# Patient Record
Sex: Male | Born: 1955 | Race: White | Hispanic: No | Smoking: Former smoker
Health system: Southern US, Community
[De-identification: ages and names within clinical notes are randomized; demographics above are authoritative.]

## PROBLEM LIST (undated history)

## (undated) DIAGNOSIS — I739 Peripheral vascular disease, unspecified: Secondary | ICD-10-CM

## (undated) DIAGNOSIS — N289 Disorder of kidney and ureter, unspecified: Secondary | ICD-10-CM

## (undated) DIAGNOSIS — R0600 Dyspnea, unspecified: Secondary | ICD-10-CM

## (undated) DIAGNOSIS — I2510 Atherosclerotic heart disease of native coronary artery without angina pectoris: Secondary | ICD-10-CM

## (undated) DIAGNOSIS — E7800 Pure hypercholesterolemia, unspecified: Secondary | ICD-10-CM

## (undated) DIAGNOSIS — I10 Essential (primary) hypertension: Secondary | ICD-10-CM

## (undated) DIAGNOSIS — J449 Chronic obstructive pulmonary disease, unspecified: Secondary | ICD-10-CM

## (undated) HISTORY — PX: CHOLECYSTECTOMY: SHX55

## (undated) HISTORY — PX: CARDIAC SURGERY: SHX584

## (undated) HISTORY — PX: CORONARY ARTERY BYPASS GRAFT: SHX141

## (undated) SURGERY — ESOPHAGOGASTRODUODENOSCOPY (EGD) WITH PROPOFOL
Anesthesia: Monitor Anesthesia Care

---

## 2005-10-02 ENCOUNTER — Inpatient Hospital Stay (HOSPITAL_COMMUNITY): Admission: AD | Admit: 2005-10-02 | Discharge: 2005-10-08 | Payer: Self-pay | Admitting: Orthopedic Surgery

## 2005-10-04 ENCOUNTER — Ambulatory Visit: Payer: Self-pay | Admitting: Hematology and Oncology

## 2005-10-09 ENCOUNTER — Ambulatory Visit: Payer: Self-pay | Admitting: Hematology and Oncology

## 2005-10-24 ENCOUNTER — Ambulatory Visit: Payer: Self-pay | Admitting: Oncology

## 2005-11-01 ENCOUNTER — Other Ambulatory Visit: Admission: RE | Admit: 2005-11-01 | Discharge: 2005-11-01 | Payer: Self-pay | Admitting: Oncology

## 2005-12-12 ENCOUNTER — Ambulatory Visit: Payer: Self-pay | Admitting: Oncology

## 2006-06-11 ENCOUNTER — Encounter: Admission: RE | Admit: 2006-06-11 | Discharge: 2006-06-11 | Payer: Self-pay | Admitting: Neurosurgery

## 2006-07-11 ENCOUNTER — Ambulatory Visit (HOSPITAL_COMMUNITY): Admission: RE | Admit: 2006-07-11 | Discharge: 2006-07-11 | Payer: Self-pay | Admitting: Neurosurgery

## 2006-07-17 ENCOUNTER — Ambulatory Visit: Payer: Self-pay | Admitting: Oncology

## 2006-08-04 ENCOUNTER — Ambulatory Visit (HOSPITAL_COMMUNITY): Admission: RE | Admit: 2006-08-04 | Discharge: 2006-08-05 | Payer: Self-pay | Admitting: Neurosurgery

## 2018-09-17 ENCOUNTER — Encounter (HOSPITAL_COMMUNITY): Payer: Self-pay

## 2018-09-17 ENCOUNTER — Inpatient Hospital Stay (HOSPITAL_COMMUNITY)
Admission: EM | Admit: 2018-09-17 | Discharge: 2018-09-23 | DRG: 628 | Disposition: A | Discharge status: Home / Self Care | Payer: Medicare Other | Attending: Oncology | Admitting: Oncology

## 2018-09-17 ENCOUNTER — Emergency Department (HOSPITAL_COMMUNITY): Payer: Medicare Other

## 2018-09-17 ENCOUNTER — Other Ambulatory Visit: Payer: Self-pay

## 2018-09-17 DIAGNOSIS — R160 Hepatomegaly, not elsewhere classified: Secondary | ICD-10-CM

## 2018-09-17 DIAGNOSIS — M21962 Unspecified acquired deformity of left lower leg: Secondary | ICD-10-CM

## 2018-09-17 DIAGNOSIS — I872 Venous insufficiency (chronic) (peripheral): Secondary | ICD-10-CM | POA: Diagnosis present

## 2018-09-17 DIAGNOSIS — I120 Hypertensive chronic kidney disease with stage 5 chronic kidney disease or end stage renal disease: Secondary | ICD-10-CM | POA: Diagnosis present

## 2018-09-17 DIAGNOSIS — Z8349 Family history of other endocrine, nutritional and metabolic diseases: Secondary | ICD-10-CM

## 2018-09-17 DIAGNOSIS — N2581 Secondary hyperparathyroidism of renal origin: Secondary | ICD-10-CM | POA: Diagnosis present

## 2018-09-17 DIAGNOSIS — Z8719 Personal history of other diseases of the digestive system: Secondary | ICD-10-CM

## 2018-09-17 DIAGNOSIS — R7989 Other specified abnormal findings of blood chemistry: Secondary | ICD-10-CM | POA: Diagnosis present

## 2018-09-17 DIAGNOSIS — E669 Obesity, unspecified: Secondary | ICD-10-CM | POA: Diagnosis present

## 2018-09-17 DIAGNOSIS — N185 Chronic kidney disease, stage 5: Secondary | ICD-10-CM

## 2018-09-17 DIAGNOSIS — E7800 Pure hypercholesterolemia, unspecified: Secondary | ICD-10-CM | POA: Diagnosis present

## 2018-09-17 DIAGNOSIS — J810 Acute pulmonary edema: Secondary | ICD-10-CM

## 2018-09-17 DIAGNOSIS — E785 Hyperlipidemia, unspecified: Secondary | ICD-10-CM | POA: Diagnosis not present

## 2018-09-17 DIAGNOSIS — Z79899 Other long term (current) drug therapy: Secondary | ICD-10-CM

## 2018-09-17 DIAGNOSIS — I4519 Other right bundle-branch block: Secondary | ICD-10-CM | POA: Diagnosis present

## 2018-09-17 DIAGNOSIS — J449 Chronic obstructive pulmonary disease, unspecified: Secondary | ICD-10-CM | POA: Diagnosis present

## 2018-09-17 DIAGNOSIS — I4510 Unspecified right bundle-branch block: Secondary | ICD-10-CM

## 2018-09-17 DIAGNOSIS — Z951 Presence of aortocoronary bypass graft: Secondary | ICD-10-CM

## 2018-09-17 DIAGNOSIS — I2510 Atherosclerotic heart disease of native coronary artery without angina pectoris: Secondary | ICD-10-CM | POA: Diagnosis present

## 2018-09-17 DIAGNOSIS — D693 Immune thrombocytopenic purpura: Secondary | ICD-10-CM | POA: Diagnosis present

## 2018-09-17 DIAGNOSIS — N270 Small kidney, unilateral: Secondary | ICD-10-CM | POA: Diagnosis present

## 2018-09-17 DIAGNOSIS — Z89422 Acquired absence of other left toe(s): Secondary | ICD-10-CM

## 2018-09-17 DIAGNOSIS — Z992 Dependence on renal dialysis: Secondary | ICD-10-CM | POA: Diagnosis not present

## 2018-09-17 DIAGNOSIS — Z9114 Patient's other noncompliance with medication regimen: Secondary | ICD-10-CM

## 2018-09-17 DIAGNOSIS — N186 End stage renal disease: Secondary | ICD-10-CM | POA: Diagnosis present

## 2018-09-17 DIAGNOSIS — N200 Calculus of kidney: Secondary | ICD-10-CM | POA: Diagnosis present

## 2018-09-17 DIAGNOSIS — D696 Thrombocytopenia, unspecified: Secondary | ICD-10-CM | POA: Diagnosis not present

## 2018-09-17 DIAGNOSIS — Z6830 Body mass index (BMI) 30.0-30.9, adult: Secondary | ICD-10-CM | POA: Diagnosis not present

## 2018-09-17 DIAGNOSIS — Z8679 Personal history of other diseases of the circulatory system: Secondary | ICD-10-CM

## 2018-09-17 DIAGNOSIS — E8770 Fluid overload, unspecified: Secondary | ICD-10-CM | POA: Diagnosis not present

## 2018-09-17 DIAGNOSIS — R0600 Dyspnea, unspecified: Secondary | ICD-10-CM | POA: Diagnosis present

## 2018-09-17 DIAGNOSIS — Z8249 Family history of ischemic heart disease and other diseases of the circulatory system: Secondary | ICD-10-CM

## 2018-09-17 DIAGNOSIS — J90 Pleural effusion, not elsewhere classified: Secondary | ICD-10-CM | POA: Diagnosis present

## 2018-09-17 DIAGNOSIS — Z89412 Acquired absence of left great toe: Secondary | ICD-10-CM

## 2018-09-17 DIAGNOSIS — Z9115 Patient's noncompliance with renal dialysis: Secondary | ICD-10-CM | POA: Diagnosis not present

## 2018-09-17 DIAGNOSIS — Z87891 Personal history of nicotine dependence: Secondary | ICD-10-CM

## 2018-09-17 DIAGNOSIS — E8779 Other fluid overload: Secondary | ICD-10-CM | POA: Diagnosis present

## 2018-09-17 DIAGNOSIS — D631 Anemia in chronic kidney disease: Secondary | ICD-10-CM | POA: Diagnosis present

## 2018-09-17 DIAGNOSIS — I4891 Unspecified atrial fibrillation: Secondary | ICD-10-CM | POA: Diagnosis present

## 2018-09-17 DIAGNOSIS — R9431 Abnormal electrocardiogram [ECG] [EKG]: Secondary | ICD-10-CM

## 2018-09-17 DIAGNOSIS — R162 Hepatomegaly with splenomegaly, not elsewhere classified: Secondary | ICD-10-CM | POA: Diagnosis not present

## 2018-09-17 HISTORY — DX: Chronic obstructive pulmonary disease, unspecified: J44.9

## 2018-09-17 HISTORY — DX: Pure hypercholesterolemia, unspecified: E78.00

## 2018-09-17 HISTORY — DX: Essential (primary) hypertension: I10

## 2018-09-17 HISTORY — DX: Disorder of kidney and ureter, unspecified: N28.9

## 2018-09-17 LAB — CBC
HCT: 33.5 % — ABNORMAL LOW (ref 39.0–52.0)
HEMOGLOBIN: 10.3 g/dL — AB (ref 13.0–17.0)
MCH: 28.5 pg (ref 26.0–34.0)
MCHC: 30.7 g/dL (ref 30.0–36.0)
MCV: 92.8 fL (ref 80.0–100.0)
Platelets: 40 10*3/uL — ABNORMAL LOW (ref 150–400)
RBC: 3.61 MIL/uL — ABNORMAL LOW (ref 4.22–5.81)
RDW: 15 % (ref 11.5–15.5)
WBC: 8.4 10*3/uL (ref 4.0–10.5)
nRBC: 0 % (ref 0.0–0.2)

## 2018-09-17 LAB — BASIC METABOLIC PANEL
ANION GAP: 16 — AB (ref 5–15)
BUN: 75 mg/dL — AB (ref 8–23)
CO2: 22 mmol/L (ref 22–32)
Calcium: 9.1 mg/dL (ref 8.9–10.3)
Chloride: 107 mmol/L (ref 98–111)
Creatinine, Ser: 9.74 mg/dL — ABNORMAL HIGH (ref 0.61–1.24)
GFR, EST AFRICAN AMERICAN: 6 mL/min — AB (ref >60)
GFR, EST NON AFRICAN AMERICAN: 5 mL/min — AB (ref >60)
Glucose, Bld: 118 mg/dL — ABNORMAL HIGH (ref 70–99)
POTASSIUM: 3.9 mmol/L (ref 3.5–5.1)
SODIUM: 145 mmol/L (ref 135–145)

## 2018-09-17 LAB — TROPONIN I
TROPONIN I: 0.06 ng/mL — AB (ref <0.03)
Troponin I: 0.08 ng/mL (ref <0.03)

## 2018-09-17 MED ORDER — ACETAMINOPHEN 650 MG RE SUPP: 650.0000 mg | Freq: Four times a day (QID) | RECTAL | Status: DC | PRN

## 2018-09-17 MED ORDER — HEPARIN SODIUM (PORCINE) 1000 UNIT/ML DIALYSIS
20.0000 [IU]/kg | INTRAMUSCULAR | Status: DC | PRN
  Administered 2018-09-17: 1900 [IU] via INTRAVENOUS_CENTRAL

## 2018-09-17 MED ORDER — ALBUTEROL SULFATE (2.5 MG/3ML) 0.083% IN NEBU
INHALATION_SOLUTION | RESPIRATORY_TRACT | Status: AC
  Filled 2018-09-17: qty 3

## 2018-09-17 MED ORDER — CHLORHEXIDINE GLUCONATE CLOTH 2 % EX PADS
6.0000 | MEDICATED_PAD | Freq: Every day | CUTANEOUS | Status: DC
  Administered 2018-09-18 – 2018-09-22 (×5): 6 via TOPICAL

## 2018-09-17 MED ORDER — ALBUTEROL SULFATE (2.5 MG/3ML) 0.083% IN NEBU
3.0000 mL | INHALATION_SOLUTION | Freq: Four times a day (QID) | RESPIRATORY_TRACT | Status: DC | PRN
  Administered 2018-09-17 – 2018-09-18 (×2): 3 mL via RESPIRATORY_TRACT
  Filled 2018-09-17: qty 3

## 2018-09-17 MED ORDER — SODIUM CHLORIDE 0.9 % IV SOLN
1.5000 g | INTRAVENOUS | Status: AC
  Administered 2018-09-18: 1.5 g via INTRAVENOUS
  Filled 2018-09-17 (×2): qty 1.5

## 2018-09-17 MED ORDER — ASPIRIN 81 MG PO CHEW
324.0000 mg | CHEWABLE_TABLET | Freq: Once | ORAL | Status: AC
  Administered 2018-09-17: 324 mg via ORAL
  Filled 2018-09-17: qty 4

## 2018-09-17 MED ORDER — HEPARIN SODIUM (PORCINE) 1000 UNIT/ML IJ SOLN
INTRAMUSCULAR | Status: AC
  Administered 2018-09-17: 1900 [IU] via INTRAVENOUS_CENTRAL
  Filled 2018-09-17: qty 2

## 2018-09-17 MED ORDER — ALBUTEROL SULFATE (2.5 MG/3ML) 0.083% IN NEBU
2.5000 mg | INHALATION_SOLUTION | Freq: Once | RESPIRATORY_TRACT | Status: AC
  Administered 2018-09-17: 2.5 mg via RESPIRATORY_TRACT
  Filled 2018-09-17: qty 3

## 2018-09-17 MED ORDER — SENNOSIDES-DOCUSATE SODIUM 8.6-50 MG PO TABS: 1.0000 | ORAL_TABLET | Freq: Every evening | ORAL | Status: DC | PRN

## 2018-09-17 MED ORDER — HEPARIN SODIUM (PORCINE) 5000 UNIT/ML IJ SOLN
5000.0000 [IU] | Freq: Three times a day (TID) | INTRAMUSCULAR | Status: DC
  Administered 2018-09-17: 5000 [IU] via SUBCUTANEOUS

## 2018-09-17 MED ORDER — ACETAMINOPHEN 325 MG PO TABS
650.0000 mg | ORAL_TABLET | Freq: Four times a day (QID) | ORAL | Status: DC | PRN
  Administered 2018-09-17 – 2018-09-23 (×12): 650 mg via ORAL
  Filled 2018-09-17 (×10): qty 2

## 2018-09-17 NOTE — Consult Note (Addendum)
[facility] NOTE   MRN : [id number]  Reason for Consult: ESRD Referring Physician: Nephrology  History of Present Illness: 63 y/o male who lives in [place], but is planning on moving here has come to [facility] secondary to SOB.  He has a right TDC that was placed in April 2019.  He last had HD Tuesday 09/15/2018.  We have been consulted for permanent access.  Past medical history includes: ESRD, HTN, Hypercholesterolemia, and COPD.       Current Facility-Administered Medications  Medication Dose Route Frequency Provider Last Rate Last Dose  . aspirin chewable tablet 324 mg  324 mg Oral Once Sherwood Gambler, MD      . Derrill Memo ON 09/18/2018] Chlorhexidine Gluconate Cloth 2 % PADS 6 each  6 each Topical Q0600 Rexene Agent, MD       Current Outpatient Medications  Medication Sig Dispense Refill  . acetaminophen (TYLENOL) 500 MG tablet Take 1,000 mg by mouth as needed for mild pain or headache.    . albuterol (PROVENTIL HFA;VENTOLIN HFA) 108 (90 Base) MCG/ACT inhaler Inhale 2 puffs into the lungs every 6 (six) hours as needed for wheezing.    . calcium carbonate (TUMS - DOSED IN MG ELEMENTAL CALCIUM) 500 MG chewable tablet Chew 2 tablets by mouth 3 (three) times daily before meals.    Marland Kitchen ipratropium-albuterol (DUONEB) 0.5-2.5 (3) MG/3ML SOLN Take 3 mLs by nebulization every 6 (six) hours as needed (wheezing).      Pt meds include: Statin :No Betablocker: No ASA: No Other anticoagulants/antiplatelets: None  Past Medical History:  Diagnosis Date  . COPD (chronic obstructive pulmonary disease) ([place])   . High cholesterol   . Hypertension   . Renal disorder     Past Surgical History:  Procedure Laterality Date  . CARDIAC SURGERY      Social History Social History   Tobacco Use  . Smoking status: Not on file  Substance Use Topics  . Alcohol use: Not on file  . Drug use: Not on file    Family History  HTN,  hyperchoolesterolemia  REVIEW OF SYSTEMS  General: [ ]  Weight loss, [ ]  Fever, [ ]  chills Neurologic: [ ]  Dizziness, [ ]  Blackouts, [ ]  Seizure [ ]  Stroke, [ ]  "Mini stroke", [ ]  Slurred speech, [ ]  Temporary blindness; [ ]  weakness in arms or legs, [ ]  Hoarseness [ ]  Dysphagia Cardiac: [ ]  Chest pain/pressure, [ ]  Shortness of breath at rest [x ] Shortness of breath with exertion, [ ]  Atrial fibrillation or irregular heartbeat  Vascular: [ ]  Pain in legs with walking, [ ]  Pain in legs at rest, [ ]  Pain in legs at night,  [ ]  Non-healing ulcer, [ ]  Blood clot in vein/DVT,   Pulmonary: [ ]  Home oxygen, [ ]  Productive cough, [ ]  Coughing up blood, [ ]  Asthma,  [ ]  Wheezing [x ] COPD Musculoskeletal:  [ ]  Arthritis, [ ]  Low back pain, [ ]  Joint pain Hematologic: [ ]  Easy Bruising, [x ] Anemia; [ ]  Hepatitis Gastrointestinal: [ ]  Blood in stool, [ ]  Gastroesophageal Reflux/heartburn, Urinary: [ ]  chronic Kidney disease, [x ] on HD - [ ]  MWF or [x ] TTHS, [ ]  Burning with urination, [ ]  Difficulty urinating Skin: [ ]  Rashes, [ ]  Wounds Psychological: [ ]  Anxiety, [ ]  Depression  Physical Examination Vitals:   09/17/18 1305 09/17/18 1507  BP: (!) 136/97 (!) 158/142  Pulse: 90 (!) 104  Resp: 20 (!) 28  Temp: 97.6 F (36.4 C)   TempSrc: Oral   SpO2: 99% 100%   There is no height or weight on file to calculate BMI.  General:  WDWN in NAD HENT: WNL, normocephalic Eyes: Pupils equal Pulmonary: normal non-labored breathing , without Rales, rhonchi,  wheezing Cardiac: RRR, without  Murmurs, rubs or gallops; No carotid bruits Abdomen: soft, NT, no masses Skin: no rashes, ulcers noted;  no Gangrene , no cellulitis; no open wounds;  Vascular Exam/Pulses:Palpable radial and brachial pulses Musculoskeletal: no muscle wasting or atrophy; B LE edema  Neurologic: A&O X 3; Appropriate Affect ;  SENSATION: normal; MOTOR FUNCTION: 5/5 Symmetric Speech is fluent/normal   Significant Diagnostic  Studies: CBC Lab Results  Component Value Date   WBC 8.4 09/17/2018   HGB 10.3 (L) 09/17/2018   HCT 33.5 (L) 09/17/2018   MCV 92.8 09/17/2018   PLT 40 (L) 09/17/2018    BMET    Component Value Date/Time   NA 145 09/17/2018 1450   K 3.9 09/17/2018 1450   CL 107 09/17/2018 1450   CO2 22 09/17/2018 1450   GLUCOSE 118 (H) 09/17/2018 1450   BUN 75 (H) 09/17/2018 1450   CREATININE 9.74 (H) 09/17/2018 1450   CALCIUM 9.1 09/17/2018 1450   GFRNONAA 5 (L) 09/17/2018 1450   GFRAA 6 (L) 09/17/2018 1450   CrCl cannot be calculated (Unknown ideal weight.).  COAG No results found for: INR, PROTIME   Non-Invasive Vascular Imaging:  Pending vein mapping  ASSESSMENT/PLAN:  ESRD Left TDC currently on HD.  Nephrologist plan on HD tonight as an in patient. Left hand dominant.  We will plan left UE fistula verses graft pending vein mapping.  NPO past MN  Roxy Horseman 09/17/2018 4:52 PM   I have examined the patient, reviewed and agree with above.  Discussed plan for access.  Described AV fistula versus AV graft.  Will image his veins with SonoSite and make determination at the time of procedure tomorrow  Curt Jews, MD 09/17/2018 6:35 PM

## 2018-09-17 NOTE — H&P (Signed)
Date: 09/17/2018               Patient Name:  Larry Klein MRN: [id number]  DOB: 01/26/1956 Age / Sex: 63 y.o., male   PCP: Patient, No Pcp Per         Medical Service: Internal Medicine Teaching Service         Attending Physician: Dr. Annia Belt, MD    First Contact: Dr. Annie Paras Pager: [phone]  Second Contact: Dr. Tarri Abernethy Pager: [phone]       After Hours (After 5p/  First Contact Pager: [phone]  weekends / holidays): Second Contact Pager: [phone]   Chief Complaint: dyspnea  History of Present Illness: Larry Klein is a 63 yo male with a history of ESRD (HD MWF via tunneled dialysis catheter since April 2019), a fib (not anticoagulated), CAD s/p CABG in 2017, AAA s/p repair, HTN, HLD, and COPD who presents to the ED with two days of dyspnea. He moved here from [place], where he was receiving his dialysis, about one week ago. He has not yet set up regular dialysis or seen a nephrologist in the area. He was last dialyzed on Tuesday 09/15/2018 after presenting to a different ED with dyspnea. He feels like they did not get enough fluid off during that session as he quickly began to feel dyspneic and have swelling and distension of the lower extremities, abdomen, and wrists. He endorses headaches, but denies chest pain. Of note, he has not taken any of his medications since April when he started dialysis.   Upon arrival to the ED, patient was afebrile and hypertensive to the 762G-315V systolic. Labs were significant for Hb 10.3, platelets 40, BUN 75, Cr 9.74, and troponin 0.06. EKG shows irregular rate, but P waves are present in some leads and T wave inversions throughout. CXR showed right-sided pleural effusion without obvious pulmonary edema. He received aspirin and albuterol in the ED. Nephrology was consulted in the ED.   Meds:  Current Meds  Medication Sig  . acetaminophen (TYLENOL) 500 MG tablet Take 1,000 mg by mouth as needed for mild pain or headache.  . albuterol  (PROVENTIL HFA;VENTOLIN HFA) 108 (90 Base) MCG/ACT inhaler Inhale 2 puffs into the lungs every 6 (six) hours as needed for wheezing.  . calcium carbonate (TUMS - DOSED IN MG ELEMENTAL CALCIUM) 500 MG chewable tablet Chew 2 tablets by mouth 3 (three) times daily before meals.  Marland Kitchen ipratropium-albuterol (DUONEB) 0.5-2.5 (3) MG/3ML SOLN Take 3 mLs by nebulization every 6 (six) hours as needed (wheezing).    Allergies: Allergies as of 09/17/2018  . (Not on File)   Past Medical History:  Diagnosis Date  . COPD (chronic obstructive pulmonary disease) ([place])   . High cholesterol   . Hypertension   . Renal disorder    Past Surgical History:  Procedure Laterality Date  . CARDIAC SURGERY      Family History: Son has HTN. No family history of kidney disease.  Social History: Recently moved from [place] to [place]. Lives with his son. 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 Former smoker, but quit in 2017. Denies etoh and illicit drug use.  Review of Systems: A complete ROS was negative except as per HPI.   Physical Exam: Blood pressure (!) 150/102, pulse 100, temperature 98 F (36.7 C), temperature source Oral, resp. rate 18, SpO2 99 %. Constitutional: Obese. No distress. Eyes: Pupils are equal, round, and reactive to light. EOM are normal.  Cardiovascular: Normal rate and regular  rhythm. No murmurs, rubs, or gallops. Tunneled catheter on left chest. Pulmonary/Chest: Effort normal. Bilateral basilar crackles. Abdominal: Bowel sounds present. Distended. Soft, non-tender. Ext: Trace lower extremity edema. Skin: Warm and dry. No rashes or wounds.  EKG: personally reviewed my interpretation is EKG shows irregular rate, but P waves are present in some leads and T wave inversions throughout. No prior EKGs available for comparison.  CXR: personally reviewed my interpretation is right-sided pleural effusion without obvious pulmonary edema.  Assessment & Plan by Problem: Active Problems:    Shortness of breath  ESRD on HD MWF - Dialysis schedule has been interrupted by his move to [place]. Last dialyzed on 12/31, however he missed a longer stretch before that.  - Appears volume overloaded on exam with bibasilar crackles, abdominal distension, and trace BLE. - Currently satting well on room air - K 3.9 Plan - Nephrology is following, appreciate recs - Dialysis tonight - Vascular surgery consulted for vein mapping in anticipation of permanent dialysis acess - Tele - am CBC and BMP - NPO at midnight   Elevated troponin - Patient denies chest pain. Troponin was mildly elevated at 0.06, although this is most likely demand ischemia in the setting of volume overload.  - EKG with irregular rate, but P waves are present in some leads and T wave inversions throughout. No prior EKGs available for comparison. Patient has a history of afib, but is not currently anticoagulated. Plan - Trend troponin - Repeat EKG post dialysis  CAD s/p CABG  - Patient is not currently taking any medications for CAD. Consider starting antiplatelet and statin.   HTN - Patient is not currently taking any medications for HTN. Will monitor pressures during admission and consider starting an anti-hypertensive if requires.  COPD - Currently uses an albuterol nebulizer four times a day and Proair inhaler as needed.  - He has never had PFTs or been on maintenance therapy. - Consider starting maintenance inhaler and PFTs as outpatient Plan - albuterol PRN  FEN: no IV fluids, renal diet, NPO at midnight, replace electrolytes as needed  DVT ppx: [place] heparin Code status: FULL code  Dispo: Admit patient to Inpatient with expected length of stay greater than 2 midnights.  Signed: Corinne Ports, MD 09/17/2018, 8:39 PM  Pager: [phone]

## 2018-09-17 NOTE — ED Provider Notes (Signed)
[facility] EMERGENCY DEPARTMENT Provider Note   CSN: [id number] Arrival date & time: 09/17/18  1257     History   Chief Complaint Chief Complaint  Patient presents with  . Needs Dialysis    HPI Larry Klein is a 62 y.o. male.  HPI  63 year old male with a history of COPD, hypertension, chronic thrombocytopenia and end-stage renal disease on dialysis presents requesting dialysis.  He is visiting from [place] as his daughter-in-law is due to give birth soon.  He is not sure how long he will be appear and may make this permanent.  He is currently living in [place].  He last received dialysis 2 days ago and [place] when his daughter-in-law was at another doctor's appointment.  He states over the last couple days he is been feeling short of breath, which he is not sure if it is from fluid or distention of his abdomen or from COPD which he often has as well.  Trace cough.  No chest pain or leg swelling.  No fevers or vomiting. He notes a prior history of afib, no anticoagulation.  He also states that he cannot get into the [place] dialysis center until sent from the [facility] doctors.  Past Medical History:  Diagnosis Date  . COPD (chronic obstructive pulmonary disease) ([place])   . High cholesterol   . Hypertension   . Renal disorder     There are no active problems to display for this patient.   Past Surgical History:  Procedure Laterality Date  . CARDIAC SURGERY          Home Medications    Prior to Admission medications   Medication Sig Start Date End Date Taking? Authorizing Provider  acetaminophen (TYLENOL) 500 MG tablet Take 1,000 mg by mouth as needed for mild pain or headache.   Yes [provider]  albuterol (PROVENTIL HFA;VENTOLIN HFA) 108 (90 Base) MCG/ACT inhaler Inhale 2 puffs into the lungs every 6 (six) hours as needed for wheezing.   Yes [provider]  calcium carbonate (TUMS - DOSED IN MG ELEMENTAL CALCIUM) 500 MG  chewable tablet Chew 2 tablets by mouth 3 (three) times daily before meals.   Yes [provider]  ipratropium-albuterol (DUONEB) 0.5-2.5 (3) MG/3ML SOLN Take 3 mLs by nebulization every 6 (six) hours as needed (wheezing).   Yes [provider]    Family History No family history on file.  Social History Social History   Tobacco Use  . Smoking status: Not on file  Substance Use Topics  . Alcohol use: Not on file  . Drug use: Not on file     Allergies   Patient has no allergy information on record.   Review of Systems Review of Systems  Constitutional: Negative for fever.  Respiratory: Positive for cough and shortness of breath.   Cardiovascular: Negative for chest pain and leg swelling.  Gastrointestinal: Negative for abdominal pain and vomiting.  All other systems reviewed and are negative.    Physical Exam Updated Vital Signs BP (!) 158/142   Pulse (!) 104   Temp 97.6 F (36.4 C) (Oral)   Resp (!) 28   SpO2 100%   Physical Exam Vitals signs and nursing note reviewed.  Constitutional:      Appearance: He is well-developed.  HENT:     Head: Normocephalic and atraumatic.     Right Ear: External ear normal.     Left Ear: External ear normal.     Nose: Nose  normal.  Eyes:     General:        Right eye: No discharge.        Left eye: No discharge.  Neck:     Musculoskeletal: Neck supple.  Cardiovascular:     Rate and Rhythm: Normal rate. Rhythm irregular.     Heart sounds: Normal heart sounds.  Pulmonary:     Effort: Pulmonary effort is normal. Tachypnea present. No accessory muscle usage.     Breath sounds: Normal breath sounds.  Chest:     Comments: Left chest dialysis catheter Abdominal:     Palpations: Abdomen is soft.     Tenderness: There is no abdominal tenderness.  Musculoskeletal:     Right lower leg: No edema.     Left lower leg: No edema.  Skin:    General: Skin is warm and dry.  Neurological:     Mental Status: He is  alert.  Psychiatric:        Mood and Affect: Mood is not anxious.      ED Treatments / Results  Labs (all labs ordered are listed, but only abnormal results are displayed) Labs Reviewed  BASIC METABOLIC PANEL - Abnormal; Notable for the following components:      Result Value   Glucose, Bld 118 (*)    BUN 75 (*)    Creatinine, Ser 9.74 (*)    GFR calc non Af Amer 5 (*)    GFR calc Af Amer 6 (*)    Anion gap 16 (*)    All other components within normal limits  CBC - Abnormal; Notable for the following components:   RBC 3.61 (*)    Hemoglobin 10.3 (*)    HCT 33.5 (*)    Platelets 40 (*)    All other components within normal limits  TROPONIN I - Abnormal; Notable for the following components:   Troponin I 0.06 (*)    All other components within normal limits  PROTIME-INR  BASIC METABOLIC PANEL  CBC    EKG EKG Interpretation  Date/Time:  Thursday September 17 2018 14:44:57 EST Ventricular Rate:  97 PR Interval:    QRS Duration: 116 QT Interval:  372 QTC Calculation: 472 R Axis:   -65 Text Interpretation:  Atrial fibrillation Incomplete right bundle branch block Left anterior fascicular block ST & T wave abnormality, consider inferior ischemia ST & T wave abnormality, consider anterolateral ischemia Prolonged QT Abnormal ECG Confirmed by Nat Christen [phone]) on 09/17/2018 4:14:22 PM   Radiology Dg Chest 2 View  Result Date: 09/17/2018 CLINICAL DATA:  Shortness of breath. EXAM: CHEST - 2 VIEW COMPARISON:  10/02/2005 FINDINGS: The heart is at the upper limits of normal in size. Tortuosity of the thoracic aorta. Interval CABG. Double lumen central catheter in place with the tips in the superior vena cava just above the cavoatrial junction. There is a small right pleural effusion. Slight accentuation of the interstitial markings at the right lung may represent mild pulmonary edema. Left lung is clear. No acute bone abnormality. Previous anterior cervical fusion. IMPRESSION: Small  right pleural effusion. Possible mild pulmonary edema at the right lung base. Electronically Signed   By: Lorriane Shire M.D.   On: 09/17/2018 15:52    Procedures Procedures (including critical care time)  Medications Ordered in ED Medications  Chlorhexidine Gluconate Cloth 2 % PADS 6 each (has no administration in time range)  cefUROXime (ZINACEF) 1.5 g in sodium chloride 0.9 % 100 mL IVPB (has no administration  in time range)  albuterol (PROVENTIL) (2.5 MG/3ML) 0.083% nebulizer solution 2.5 mg (2.5 mg Nebulization Given 09/17/18 1604)  aspirin chewable tablet 324 mg (324 mg Oral Given 09/17/18 1716)     Initial Impression / Assessment and Plan / ED Course  I have reviewed the triage vital signs and the nursing notes.  Pertinent labs & imaging results that were available during my care of the patient were reviewed by me and considered in my medical decision making (see chart for details).     Dr. Joelyn Oms has seen patient and request medical admission.  He will dialyze but the patient will need permanent dialysis access.  The patient likely has a little bit of pulmonary edema causing his shortness of breath.  Has an abnormal ECG but the last comparison is over 10 years ago and has had a CABG since, unclear if new or old EKG changes.  Troponins probably more from pulmonary edema and creatinine rather than ACS but will need to be trended.  Internal medicine teaching service to admit.  Final Clinical Impressions(s) / ED Diagnoses   Final diagnoses:  ESRD (end stage renal disease) on dialysis [facility])  Acute pulmonary edema [facility])    ED Discharge Orders    None       Sherwood Gambler, MD 09/17/18 [phone]

## 2018-09-17 NOTE — ED Triage Notes (Signed)
Pt visiting from [place] and is here to receive dialysis. Pt last treatment was last Tuesday. Pt endorsing Shortness of breath, denies any pain. NAD noted in triage.

## 2018-09-17 NOTE — ED Notes (Signed)
Pt returned from Xray per RT.

## 2018-09-17 NOTE — ED Notes (Signed)
Vascular at bedside

## 2018-09-17 NOTE — ED Notes (Signed)
Patient transported to X-ray 

## 2018-09-17 NOTE — ED Notes (Signed)
Critical result give to Dr. Verta Ellen at bedside.

## 2018-09-17 NOTE — Consult Note (Signed)
[facility] Admit Date: 09/17/2018 09/17/2018 Rexene Agent Requesting Physician:  Regenia Skeeter MD  Reason for Consult:  ESRD, SOB, No HD Unit HPI:  63 year old male ESRD, recently moved to the area from [place], presented to the emergency room with shortness of breath and for dialysis.  PMH Incudes:  CAD with history of four-vessel CABG 2017  AAA status post surgical repair  COPD  Past tobacco user  Patient has received dialysis since April of this year via tunneled dialysis catheter without permanent access.  He has moved into the area as he is expecting a newborn in the family.  He clearly states he intends to live here permanently.  He states that he made attempts to connect to the [facility] center but was unsuccessful; I called them and they are unaware of him.  Labs are notable for potassium of 3.9, BUN 75, bicarbonate 22, hemoglobin 10.3.  2 view chest x-ray with small right pleural effusion, possible mild pulmonary edema.  Patient without fever, chills, cough.  No significant lower extremity edema.  No angina.   Creatinine, Ser (mg/dL)  Date Value  09/17/2018 9.74 (H)  ] I/Os: Balance of 12 systems is negative w/ exceptions as above  PMH  Past Medical History:  Diagnosis Date  . COPD (chronic obstructive pulmonary disease) ([place])   . High cholesterol   . Hypertension   . Renal disorder    PSH  Past Surgical History:  Procedure Laterality Date  . CARDIAC SURGERY     FH No family history on file. SH  has no history on file for tobacco, alcohol, and drug. Allergies Not on File Home medications Prior to Admission medications   Medication Sig Start Date End Date Taking? Authorizing Provider  acetaminophen (TYLENOL) 500 MG tablet Take 1,000 mg by mouth as needed for mild pain or headache.   Yes [provider]  albuterol (PROVENTIL HFA;VENTOLIN HFA) 108 (90 Base) MCG/ACT inhaler Inhale 2 puffs into the lungs every 6 (six) hours as needed  for wheezing.   Yes [provider]  calcium carbonate (TUMS - DOSED IN MG ELEMENTAL CALCIUM) 500 MG chewable tablet Chew 2 tablets by mouth 3 (three) times daily before meals.   Yes [provider]  ipratropium-albuterol (DUONEB) 0.5-2.5 (3) MG/3ML SOLN Take 3 mLs by nebulization every 6 (six) hours as needed (wheezing).   Yes [provider]    Current Medications Scheduled Meds: . aspirin  324 mg Oral Once  . [START ON 09/18/2018] Chlorhexidine Gluconate Cloth  6 each Topical Q0600   Continuous Infusions: PRN Meds:.  CBC Recent Labs  Lab 09/17/18 1450  WBC 8.4  HGB 10.3*  HCT 33.5*  MCV 92.8  PLT 40*   Basic Metabolic Panel Recent Labs  Lab 09/17/18 1450  NA 145  K 3.9  CL 107  CO2 22  GLUCOSE 118*  BUN 75*  CREATININE 9.74*  CALCIUM 9.1    Physical Exam  Blood pressure (!) 158/142, pulse (!) 104, temperature 97.6 F (36.4 C), temperature source Oral, resp. rate (!) 28, SpO2 100 %. GEN: NAD, obese ENT: NCAT EYES: EOMI, anicteric sclera CV: RRR, normal S1 and S2 without murmur or rub PULM: Clear bilaterally, normal work of breathing, speaks in full sentences ABD: Soft, nontender SKIN: No rashes or lesions; left internal jugular tunneled dialysis catheter exit site and tract are clean dry intact without erythema or purulence EXT: No edema   Assessment 63 year old male ESRD relocated the area, currently without dialysis  home, with shortness of breath and mild pulmonary edema  1. ESRD, no outpt HD Unit after moving into the area 2. Lack of permanent access 3. Anemia, mild 4. CKD - BMD 5. CAD hx/o CABG 6. AAA s/p repair 7. COPD, past T user  Plan 1. HD tonight 4h TDC 2K start with 3L tight heparin 2. VVS consulted for AVF; vein mapping ordered 3. CLIP to outpt HD unit 4. Anemia labs 5. Check Phos and PTH    Pearson Grippe MD [phone] pgr 09/17/2018, 4:55 PM

## 2018-09-17 NOTE — ED Notes (Signed)
ED Provider at bedside.  Last dialyzed on Tuesday. He reports he needs dialysis today and he has been shortness of breath for 2 days hx of sob denies chest pain. He reports it happens sporadically.

## 2018-09-18 ENCOUNTER — Encounter (HOSPITAL_COMMUNITY): Payer: Self-pay

## 2018-09-18 ENCOUNTER — Inpatient Hospital Stay (HOSPITAL_COMMUNITY): Payer: Medicare Other | Admitting: Certified Registered Nurse Anesthetist

## 2018-09-18 ENCOUNTER — Encounter (HOSPITAL_COMMUNITY)
Admission: EM | Disposition: A | Discharge status: Home / Self Care | Payer: Self-pay | Source: Home / Self Care | Attending: Oncology

## 2018-09-18 ENCOUNTER — Telehealth: Payer: Self-pay | Admitting: Vascular Surgery

## 2018-09-18 HISTORY — PX: THROMBECTOMY W/ EMBOLECTOMY: SHX2507

## 2018-09-18 LAB — SURGICAL PCR SCREEN
MRSA, PCR: NEGATIVE
Staphylococcus aureus: NEGATIVE

## 2018-09-18 LAB — COMPREHENSIVE METABOLIC PANEL
ALBUMIN: 3.8 g/dL (ref 3.5–5.0)
ALT: 15 U/L (ref 0–44)
AST: 16 U/L (ref 15–41)
Alkaline Phosphatase: 64 U/L (ref 38–126)
Anion gap: 11 (ref 5–15)
BUN: 33 mg/dL — AB (ref 8–23)
CO2: 24 mmol/L (ref 22–32)
Calcium: 8.4 mg/dL — ABNORMAL LOW (ref 8.9–10.3)
Chloride: 103 mmol/L (ref 98–111)
Creatinine, Ser: 5.98 mg/dL — ABNORMAL HIGH (ref 0.61–1.24)
GFR calc Af Amer: 11 mL/min — ABNORMAL LOW (ref >60)
GFR calc non Af Amer: 9 mL/min — ABNORMAL LOW (ref >60)
Glucose, Bld: 166 mg/dL — ABNORMAL HIGH (ref 70–99)
Potassium: 4.2 mmol/L (ref 3.5–5.1)
Sodium: 138 mmol/L (ref 135–145)
Total Bilirubin: 0.6 mg/dL (ref 0.3–1.2)
Total Protein: 7 g/dL (ref 6.5–8.1)

## 2018-09-18 LAB — TROPONIN I: Troponin I: 0.08 ng/mL (ref <0.03)

## 2018-09-18 LAB — CBC
HCT: 33.2 % — ABNORMAL LOW (ref 39.0–52.0)
Hemoglobin: 10.6 g/dL — ABNORMAL LOW (ref 13.0–17.0)
MCH: 28.8 pg (ref 26.0–34.0)
MCHC: 31.9 g/dL (ref 30.0–36.0)
MCV: 90.2 fL (ref 80.0–100.0)
PLATELETS: 30 10*3/uL — AB (ref 150–400)
RBC: 3.68 MIL/uL — ABNORMAL LOW (ref 4.22–5.81)
RDW: 14.9 % (ref 11.5–15.5)
WBC: 9.6 10*3/uL (ref 4.0–10.5)
nRBC: 0.2 % (ref 0.0–0.2)

## 2018-09-18 LAB — CBC WITH DIFFERENTIAL/PLATELET
Abs Immature Granulocytes: 0.05 10*3/uL (ref 0.00–0.07)
Basophils Absolute: 0 10*3/uL (ref 0.0–0.1)
Basophils Relative: 0 %
Eosinophils Absolute: 0.1 10*3/uL (ref 0.0–0.5)
Eosinophils Relative: 1 %
HCT: 32.7 % — ABNORMAL LOW (ref 39.0–52.0)
Hemoglobin: 10.3 g/dL — ABNORMAL LOW (ref 13.0–17.0)
Immature Granulocytes: 1 %
LYMPHS ABS: 0.3 10*3/uL — AB (ref 0.7–4.0)
Lymphocytes Relative: 5 %
MCH: 28.7 pg (ref 26.0–34.0)
MCHC: 31.5 g/dL (ref 30.0–36.0)
MCV: 91.1 fL (ref 80.0–100.0)
Monocytes Absolute: 0.2 10*3/uL (ref 0.1–1.0)
Monocytes Relative: 2 %
Neutro Abs: 6.9 10*3/uL (ref 1.7–7.7)
Neutrophils Relative %: 91 %
Platelets: 30 10*3/uL — ABNORMAL LOW (ref 150–400)
RBC: 3.59 MIL/uL — ABNORMAL LOW (ref 4.22–5.81)
RDW: 14.7 % (ref 11.5–15.5)
WBC: 7.5 10*3/uL (ref 4.0–10.5)
nRBC: 0 % (ref 0.0–0.2)

## 2018-09-18 LAB — PROTIME-INR
INR: 1.18
Prothrombin Time: 14.9 seconds (ref 11.4–15.2)

## 2018-09-18 LAB — HIV ANTIBODY (ROUTINE TESTING W REFLEX): HIV Screen 4th Generation wRfx: NONREACTIVE

## 2018-09-18 LAB — RETICULOCYTES
IMMATURE RETIC FRACT: 21.3 % — AB (ref 2.3–15.9)
RBC.: 3.59 MIL/uL — AB (ref 4.22–5.81)
Retic Count, Absolute: 68.6 10*3/uL (ref 19.0–186.0)
Retic Ct Pct: 1.9 % (ref 0.4–3.1)

## 2018-09-18 LAB — IRON AND TIBC
Iron: 31 ug/dL — ABNORMAL LOW (ref 45–182)
Saturation Ratios: 14 % — ABNORMAL LOW (ref 17.9–39.5)
TIBC: 230 ug/dL — ABNORMAL LOW (ref 250–450)
UIBC: 199 ug/dL

## 2018-09-18 LAB — LACTATE DEHYDROGENASE: LDH: 150 U/L (ref 98–192)

## 2018-09-18 LAB — DIRECT ANTIGLOBULIN TEST (NOT AT ARMC)
DAT, IgG: NEGATIVE
DAT, complement: NEGATIVE

## 2018-09-18 LAB — PHOSPHORUS: PHOSPHORUS: 4.2 mg/dL (ref 2.5–4.6)

## 2018-09-18 LAB — SAVE SMEAR(SSMR), FOR PROVIDER SLIDE REVIEW

## 2018-09-18 LAB — FERRITIN: Ferritin: 489 ng/mL — ABNORMAL HIGH (ref 24–336)

## 2018-09-18 SURGERY — THROMBECTOMY ARTERIOVENOUS FISTULA
Anesthesia: General | Laterality: Left

## 2018-09-18 MED ORDER — ONDANSETRON HCL 4 MG/2ML IJ SOLN: 4.0000 mg | Freq: Once | INTRAMUSCULAR | Status: DC | PRN

## 2018-09-18 MED ORDER — FENTANYL CITRATE (PF) 100 MCG/2ML IJ SOLN: 25.0000 ug | INTRAMUSCULAR | Status: DC | PRN

## 2018-09-18 MED ORDER — SODIUM CHLORIDE 0.9 % IV SOLN
INTRAVENOUS | Status: AC
  Filled 2018-09-18: qty 1.2

## 2018-09-18 MED ORDER — ALBUMIN HUMAN 5 % IV SOLN
INTRAVENOUS | Status: DC | PRN
  Administered 2018-09-18: 10:00:00 via INTRAVENOUS

## 2018-09-18 MED ORDER — DEXAMETHASONE SODIUM PHOSPHATE 10 MG/ML IJ SOLN
INTRAMUSCULAR | Status: AC
  Filled 2018-09-18: qty 1

## 2018-09-18 MED ORDER — FENTANYL CITRATE (PF) 100 MCG/2ML IJ SOLN
INTRAMUSCULAR | Status: DC | PRN
  Administered 2018-09-18 (×2): 25 ug via INTRAVENOUS

## 2018-09-18 MED ORDER — SODIUM CHLORIDE 0.9 % IV SOLN
INTRAVENOUS | Status: DC | PRN
  Administered 2018-09-18: 40 ug/min via INTRAVENOUS

## 2018-09-18 MED ORDER — HEPARIN SODIUM (PORCINE) 1000 UNIT/ML DIALYSIS
1000.0000 [IU] | INTRAMUSCULAR | Status: DC | PRN
  Administered 2018-09-18: 1000 [IU] via INTRAVENOUS_CENTRAL
  Administered 2018-09-19: 3900 [IU] via INTRAVENOUS_CENTRAL

## 2018-09-18 MED ORDER — ONDANSETRON HCL 4 MG/2ML IJ SOLN
INTRAMUSCULAR | Status: DC | PRN
  Administered 2018-09-18: 4 mg via INTRAVENOUS

## 2018-09-18 MED ORDER — PROPOFOL 10 MG/ML IV BOLUS
INTRAVENOUS | Status: DC | PRN
  Administered 2018-09-18: 130 mg via INTRAVENOUS

## 2018-09-18 MED ORDER — ONDANSETRON HCL 4 MG/2ML IJ SOLN
INTRAMUSCULAR | Status: AC
  Filled 2018-09-18: qty 2

## 2018-09-18 MED ORDER — SODIUM CHLORIDE 0.9 % IV SOLN
INTRAVENOUS | Status: DC | PRN
  Administered 2018-09-18: 09:00:00

## 2018-09-18 MED ORDER — PHENYLEPHRINE 40 MCG/ML (10ML) SYRINGE FOR IV PUSH (FOR BLOOD PRESSURE SUPPORT)
PREFILLED_SYRINGE | INTRAVENOUS | Status: DC | PRN
  Administered 2018-09-18: 80 ug via INTRAVENOUS

## 2018-09-18 MED ORDER — MIDAZOLAM HCL 2 MG/2ML IJ SOLN
INTRAMUSCULAR | Status: AC
  Filled 2018-09-18: qty 2

## 2018-09-18 MED ORDER — SODIUM CHLORIDE 0.9 % IV SOLN
INTRAVENOUS | Status: DC
  Administered 2018-09-18: 08:00:00 via INTRAVENOUS

## 2018-09-18 MED ORDER — SODIUM CHLORIDE 0.9 % IV SOLN
INTRAVENOUS | Status: DC | PRN
  Administered 2018-09-18: 09:00:00 via INTRAVENOUS

## 2018-09-18 MED ORDER — SODIUM CHLORIDE 0.9 % IR SOLN
Status: DC | PRN
  Administered 2018-09-18: 1000 mL

## 2018-09-18 MED ORDER — LIDOCAINE 2% (20 MG/ML) 5 ML SYRINGE
INTRAMUSCULAR | Status: AC
  Filled 2018-09-18: qty 5

## 2018-09-18 MED ORDER — LIDOCAINE-EPINEPHRINE 0.5 %-1:200000 IJ SOLN
INTRAMUSCULAR | Status: AC
  Filled 2018-09-18: qty 1

## 2018-09-18 MED ORDER — PROPOFOL 10 MG/ML IV BOLUS
INTRAVENOUS | Status: AC
  Filled 2018-09-18: qty 20

## 2018-09-18 MED ORDER — FENTANYL CITRATE (PF) 250 MCG/5ML IJ SOLN
INTRAMUSCULAR | Status: AC
  Filled 2018-09-18: qty 5

## 2018-09-18 MED ORDER — DEXAMETHASONE SODIUM PHOSPHATE 10 MG/ML IJ SOLN
INTRAMUSCULAR | Status: DC | PRN
  Administered 2018-09-18: 4 mg via INTRAVENOUS

## 2018-09-18 MED ORDER — HEPARIN SODIUM (PORCINE) 1000 UNIT/ML IJ SOLN
INTRAMUSCULAR | Status: AC
  Administered 2018-09-18: 1000 [IU] via INTRAVENOUS_CENTRAL
  Filled 2018-09-18: qty 4

## 2018-09-18 MED ORDER — OXYCODONE-ACETAMINOPHEN 5-325 MG PO TABS: 1.0000 | ORAL_TABLET | Freq: Four times a day (QID) | ORAL | Status: DC | PRN

## 2018-09-18 MED ORDER — LIDOCAINE 2% (20 MG/ML) 5 ML SYRINGE
INTRAMUSCULAR | Status: DC | PRN
  Administered 2018-09-18: 100 mg via INTRAVENOUS

## 2018-09-18 MED ORDER — MIDAZOLAM HCL 5 MG/5ML IJ SOLN
INTRAMUSCULAR | Status: DC | PRN
  Administered 2018-09-18 (×2): 1 mg via INTRAVENOUS

## 2018-09-18 MED ORDER — HEPARIN SODIUM (PORCINE) 5000 UNIT/ML IJ SOLN
5000.0000 [IU] | Freq: Three times a day (TID) | INTRAMUSCULAR | Status: DC
  Administered 2018-09-18 – 2018-09-21 (×5): 5000 [IU] via SUBCUTANEOUS
  Filled 2018-09-18 (×5): qty 1

## 2018-09-18 SURGICAL SUPPLY — 34 items
ADH SKN CLS APL DERMABOND .7 (GAUZE/BANDAGES/DRESSINGS) ×1
ARMBAND PINK RESTRICT EXTREMIT (MISCELLANEOUS) ×4 IMPLANT
CANISTER SUCT 3000ML PPV (MISCELLANEOUS) ×3 IMPLANT
CANNULA VESSEL 3MM 2 BLNT TIP (CANNULA) ×3 IMPLANT
CATH EMB 4FR 80CM (CATHETERS) ×1 IMPLANT
CLIP LIGATING EXTRA MED SLVR (CLIP) ×3 IMPLANT
CLIP LIGATING EXTRA SM BLUE (MISCELLANEOUS) ×3 IMPLANT
COVER WAND RF STERILE (DRAPES) ×1 IMPLANT
DECANTER SPIKE VIAL GLASS SM (MISCELLANEOUS) ×1 IMPLANT
DERMABOND ADVANCED (GAUZE/BANDAGES/DRESSINGS) ×2
DERMABOND ADVANCED .7 DNX12 (GAUZE/BANDAGES/DRESSINGS) ×1 IMPLANT
ELECT REM PT RETURN 9FT ADLT (ELECTROSURGICAL) ×3
ELECTRODE REM PT RTRN 9FT ADLT (ELECTROSURGICAL) ×1 IMPLANT
GLOVE BIO SURGEON STRL SZ 6.5 (GLOVE) ×3 IMPLANT
GLOVE BIO SURGEONS STRL SZ 6.5 (GLOVE) ×3
GLOVE BIOGEL PI IND STRL 6.5 (GLOVE) IMPLANT
GLOVE BIOGEL PI IND STRL 7.0 (GLOVE) IMPLANT
GLOVE BIOGEL PI INDICATOR 6.5 (GLOVE) ×2
GLOVE BIOGEL PI INDICATOR 7.0 (GLOVE) ×2
GLOVE SS BIOGEL STRL SZ 7.5 (GLOVE) ×1 IMPLANT
GLOVE SUPERSENSE BIOGEL SZ 7.5 (GLOVE) ×2
GOWN STRL REUS W/ TWL LRG LVL3 (GOWN DISPOSABLE) ×3 IMPLANT
GOWN STRL REUS W/TWL LRG LVL3 (GOWN DISPOSABLE) ×9
KIT BASIN OR (CUSTOM PROCEDURE TRAY) ×3 IMPLANT
KIT TURNOVER KIT B (KITS) ×3 IMPLANT
NS IRRIG 1000ML POUR BTL (IV SOLUTION) ×3 IMPLANT
PACK CV ACCESS (CUSTOM PROCEDURE TRAY) ×3 IMPLANT
PAD ARMBOARD 7.5X6 YLW CONV (MISCELLANEOUS) ×6 IMPLANT
SUT PROLENE 6 0 CC (SUTURE) ×3 IMPLANT
SUT VIC AB 3-0 SH 27 (SUTURE) ×3
SUT VIC AB 3-0 SH 27X BRD (SUTURE) ×1 IMPLANT
TOWEL GREEN STERILE (TOWEL DISPOSABLE) ×3 IMPLANT
UNDERPAD 30X30 (UNDERPADS AND DIAPERS) ×3 IMPLANT
WATER STERILE IRR 1000ML POUR (IV SOLUTION) ×3 IMPLANT

## 2018-09-18 NOTE — Progress Notes (Signed)
Refuses bed alarm, educated and patient agree to call for assistance ambulating. Will continue to monitor

## 2018-09-18 NOTE — Telephone Encounter (Signed)
sch appt lvm [facility] ltr 10/28/2018 1pm Dialysis duplex 2pm p/o PA

## 2018-09-18 NOTE — Progress Notes (Signed)
Pt ready to depart from PACU. Awaiting transport help

## 2018-09-18 NOTE — Progress Notes (Signed)
HD tx completed @ 0250 w/cramping issues requiring UF off for approximately last 40 mins of tx UF goal not met Blood rinsed back VSS Report called to Rockie Neighbours, RN

## 2018-09-18 NOTE — Progress Notes (Signed)
HD tx initiated via HD cath w/o problem Bilat ports: pull/push/flush equally w/o problem VSS Will continue to monitor while on HD tx 

## 2018-09-18 NOTE — Transfer of Care (Signed)
Immediate Anesthesia Transfer of Care Note  Patient: Larry Klein  Procedure(s) Performed: ARTERIOVENOUS FISTULA LEFT ARM (Left )  Patient Location: PACU  Anesthesia Type:General  Level of Consciousness: awake, alert  and oriented  Airway & Oxygen Therapy: Patient Spontanous Breathing  Post-op Assessment: Report given to RN and Post -op Vital signs reviewed and stable  Post vital signs: Reviewed and stable  Last Vitals:  Vitals Value Taken Time  BP 111/77 09/18/2018 10:53 AM  Temp 36.5 C 09/18/2018 10:53 AM  Pulse 25 09/18/2018 10:54 AM  Resp 18 09/18/2018 10:54 AM  SpO2 99 % 09/18/2018 10:54 AM  Vitals shown include unvalidated device data.  Last Pain:  Vitals:   09/18/18 1053  TempSrc:   PainSc: (P) 0-No pain      Patients Stated Pain Goal: 0 (56/21/30 8657)  Complications: No apparent anesthesia complications

## 2018-09-18 NOTE — Anesthesia Preprocedure Evaluation (Addendum)
Anesthesia Evaluation  Patient identified by MRN, date of birth, ID band Patient awake    Reviewed: Allergy & Precautions, NPO status , Patient's Chart, lab work & pertinent test results  Airway Mallampati: II  TM Distance: >3 FB Neck ROM: Full    Dental  (+) Dental Advisory Given, Edentulous Lower, Upper Dentures   Pulmonary shortness of breath, COPD,  COPD inhaler, former smoker,    Pulmonary exam normal breath sounds clear to auscultation       Cardiovascular hypertension, (-) angina+ CAD, + CABG (2017) and + Peripheral Vascular Disease (AAA s/p repair)  (-) Past MI + dysrhythmias Atrial Fibrillation  Rhythm:Irregular Rate:Abnormal     Neuro/Psych negative neurological ROS  negative psych ROS   GI/Hepatic negative GI ROS, Neg liver ROS,   Endo/Other  negative endocrine ROS  Renal/GU ESRF and DialysisRenal disease (K+ 3.2)     Musculoskeletal negative musculoskeletal ROS (+)   Abdominal   Peds  Hematology  (+) Blood dyscrasia (Thrombocytopenia), anemia ,   Anesthesia Other Findings Day of surgery medications reviewed with the patient.  Reproductive/Obstetrics                            Anesthesia Physical Anesthesia Plan  ASA: III  Anesthesia Plan: General   Post-op Pain Management:    Induction: Intravenous  PONV Risk Score and Plan: 2 and Ondansetron and Midazolam  Airway Management Planned: LMA  Additional Equipment:   Intra-op Plan:   Post-operative Plan: Extubation in OR  Informed Consent: I have reviewed the patients History and Physical, chart, labs and discussed the procedure including the risks, benefits and alternatives for the proposed anesthesia with the patient or authorized representative who has indicated his/her understanding and acceptance.   Dental advisory given  Plan Discussed with: CRNA  Anesthesia Plan Comments:         Anesthesia Quick  Evaluation

## 2018-09-18 NOTE — Progress Notes (Signed)
   Subjective: Larry Klein received HD overnight, however his fluid goal was not met due to cramping during the session. This morning, he underwent creation of left radiocephalic AV fistula with vascular surgery. He is seen sitting comfortably in bed postoperatively. He reports that his dyspnea feels improved and he thinks they got plenty of fluid off during dialysis yesterday. His left wrist is sore from the procedure, but he otherwise feels well. He hopes to continue outpatient dialysis in [place]. He also plans to establish with a PCP in [place].  Objective:  Vital signs in last 24 hours: Vitals:   09/18/18 0250 09/18/18 0300 09/18/18 0359 09/18/18 0425  BP: (!) 168/109 (!) 157/95  (!) 143/111  Pulse: 86 95  96  Resp: '18 20  18  '$ Temp:  98.1 F (36.7 C)  97.6 F (36.4 C)  TempSrc:  Oral  Oral  SpO2: 95% 93% 99% 98%  Weight:  97 kg     Physical Exam Constitutional: Obese. No distress. Eyes:Pupils are equal, round, and reactive to light.EOMare normal.  Cardiovascular:Normal rateand regular rhythm. No murmurs, rubs, or gallops. Tunneled catheter on left chest. Pulmonary/Chest:Effort normal on room air. Faint wheezes bilaterally. No crackles. Abdominal: Bowel sounds present. Distended. Soft, non-tender. Ext: Left wrist incision is clean, dry, and intact s/p AVF creation. 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 Chronic stasis skin changes. Trace lower extremity edema. Skin: Warm and dry.   Assessment/Plan:  Active Problems:   Shortness of breath  Larry Klein is a 63 yo male with a history of ESRD (HD MWF via tunneled dialysis catheter since April 2019), a fib (not anticoagulated), CAD s/p CABG in 2017, AAA s/p repair, HTN, HLD, and COPD who was admitted on 1/2 for dialysis after missed sessions.  ESRD on HD MWF - Dialyzed overnight. Left radiocephalic AV fistula placed this morning. He  will continue to use the tunneled catheter while the fistula matures. - He will require inpatient dialysis until he is connected to an outpatient dialysis center.  Plan - Nephrology is following, appreciate recs - Hemodialysis tomorrow   Elevated troponin - Troponin peaked at 0.08. Likely demand ischemia in the setting of volume overload 2/2 inadequate dialysis prior to admission  CAD s/p CABG  - Patient is not currently taking any medications for CAD. Will defer medical management to future PCP in the outpatient setting.   HTN - Patient is not currently taking any medications for HTN. He reports that he stopped taking them when he started dialysis because his blood pressures were dropping too low. Will continue to monitor pressures while inpatient.  COPD - Wheezes on exam today Plan - Albuterol PRN - Consider PFTs and maintenance therapy in the outpatient setting  Dispo: Anticipated discharge in approximately 1-2 days pending spot at outpatient dialysis center.   Dorrell, Andree Elk, MD 09/18/2018, 6:33 AM Pager: [phone]

## 2018-09-18 NOTE — Progress Notes (Signed)
Patient was seen on dialysis and the procedure was supervised.   BP is 176/104 asymp.  UF 3.2 with net goal of 2.7L [place] 4/2.25  Through [facility]  Patient appears to be tolerating treatment well; no changes.  Otelia Santee, MD 09/18/2018, 1:35 AM

## 2018-09-18 NOTE — Telephone Encounter (Signed)
-----   Message from Gabriel Earing, [place] sent at 09/18/2018 10:37 AM EST ----- S/p left RC AVF 09/18/18.  F/u with pa clinic in 6 weeks with duplex.  Thanks

## 2018-09-18 NOTE — Discharge Instructions (Signed)
° °  [facility]  Discharge Instructions  AV Fistula or Graft Surgery for Dialysis Access  Please refer to the following instructions for your post-procedure care. Your surgeon or physician assistant will discuss any changes with you.  Activity  You may drive the day following your surgery, if you are comfortable and no longer taking prescription pain medication. Resume full activity as the soreness in your incision resolves.  Bathing/Showering  You may shower after you go home. Keep your incision dry for 48 hours. Do not soak in a bathtub, hot tub, or swim until the incision heals completely. You may not shower if you have a hemodialysis catheter.  Incision Care  Clean your incision with mild soap and water after 48 hours. Pat the area dry with a clean towel. You do not need a bandage unless otherwise instructed. Do not apply any ointments or creams to your incision. You may have skin glue on your incision. Do not peel it off. It will come off on its own in about one week. Your arm may swell a bit after surgery. To reduce swelling use pillows to elevate your arm so it is above your heart. Your doctor will tell you if you need to lightly wrap your arm with an ACE bandage.  Diet  Resume your normal diet. There are not special food restrictions following this procedure. In order to heal from your surgery, it is CRITICAL to get adequate nutrition. Your body requires vitamins, minerals, and protein. Vegetables are the best source of vitamins and minerals. Vegetables also provide the perfect balance of protein. Processed food has little nutritional value, so try to avoid this.  Medications  Resume taking all of your medications. If your incision is causing pain, you may take over-the counter pain relievers such as acetaminophen (Tylenol). If you were prescribed a stronger pain medication, please be aware these medications can cause nausea and constipation. Prevent  nausea by taking the medication with a snack or meal. Avoid constipation by drinking plenty of fluids and eating foods with high amount of fiber, such as fruits, vegetables, and grains.  Do not take Tylenol if you are taking prescription pain medications.  Follow up Your surgeon may want to see you in the office following your access surgery. If so, this will be arranged at the time of your surgery.  Please call us immediately for any of the following conditions:  Increased pain, redness, drainage (pus) from your incision site Fever of 101 degrees or higher Severe or worsening pain at your incision site Hand pain or numbness.  Reduce your risk of vascular disease:  Stop smoking. If you would like help, call [facility] at 1-800-QUIT-NOW (303) 510-9436) or [place] at [place] your cholesterol Maintain a desired weight Control your diabetes Keep your blood pressure down  Dialysis  It will take several weeks to several months for your new dialysis access to be ready for use. Your surgeon will determine when it is okay to use it. Your nephrologist will continue to direct your dialysis. You can continue to use your Permcath until your new access is ready for use.   09/18/2018 Larry Klein [id number] [birth year]  Surgeon(s): Early, Arvilla Meres, MD  Procedure(s): Creation of left radial cephalic AV fistula  x Do not stick fistula for 12 weeks    If you have any questions, please call the office at [phone].

## 2018-09-18 NOTE — Op Note (Signed)
    OPERATIVE REPORT  DATE OF SURGERY: 09/18/2018  PATIENT: Larry Klein, 63 y.o. male MRN: [id number]  DOB: 01-27-1956  PRE-OPERATIVE DIAGNOSIS: End-stage renal disease  POST-OPERATIVE DIAGNOSIS:  Same  PROCEDURE: Left radiocephalic AV fistula creation  SURGEON:  Curt Jews, M.D.  PHYSICIAN ASSISTANT: Liana Crocker, PA-C  ANESTHESIA: LMA  EBL: per anesthesia record  Total I/O In: 450 [I.V.:200; IV Piggyback:250] Out: 10 [Blood:10]  BLOOD ADMINISTERED: none  DRAINS: none  SPECIMEN: none  COUNTS CORRECT:  YES  PATIENT DISPOSITION:  PACU - hemodynamically stable  PROCEDURE DETAILS: Patient was taken the operating placed supine position where the area of the left arm was prepped draped you sterile fashion.  SonoSite ultrasound was used to visualize cephalic vein in the arm.  The cephalic vein at the wrist was of moderate size and this was imaged up through the antecubital space and was patent throughout.  An incision was made between the level of the cephalic vein and the radial artery.  The tributary branches were ligated on the cephalic vein and the vein was ligated distally.  The vein was gently dilated with heparinized saline and was of good caliber.  The radial artery was exposed with the same incision.  It had minimal atherosclerotic change and excellent pulse.  The artery was occluded proximally and distally with Serafin clamps.  The artery was opened with an 11 blade sent longitudinally with Potts scissors.  The vein was cut to the appropriate length and was spatulated and sewn end-to-side to the artery with a running 6-0 Prolene suture.  Clamps were removed and excellent thrill was noted.  The wounds irrigated with saline.  Hemostasis was obtained with electrocautery.  The wounds were closed with 3-0 Vicryl in the subcutaneous and subcuticular tissue.  Sterile dressing was applied the patient was transferred to the recovery room in stable condition   Rosetta Posner, M.D.,  [facility] 09/18/2018 11:01 AM

## 2018-09-18 NOTE — Anesthesia Procedure Notes (Signed)
Procedure Name: LMA Insertion °Performed by: Keri Veale H, CRNA °Pre-anesthesia Checklist: Patient identified, Emergency Drugs available, Suction available and Patient being monitored °Patient Re-evaluated:Patient Re-evaluated prior to induction °Oxygen Delivery Method: Circle System Utilized °Preoxygenation: Pre-oxygenation with 100% oxygen °Induction Type: IV induction °Ventilation: Mask ventilation without difficulty °LMA: LMA inserted °LMA Size: 4.0 °Number of attempts: 1 °Airway Equipment and Method: Bite block °Placement Confirmation: positive ETCO2 °Tube secured with: Tape °Dental Injury: Teeth and Oropharynx as per pre-operative assessment  ° ° ° ° ° ° °

## 2018-09-19 ENCOUNTER — Inpatient Hospital Stay (HOSPITAL_COMMUNITY): Payer: Medicare Other

## 2018-09-19 DIAGNOSIS — Z992 Dependence on renal dialysis: Secondary | ICD-10-CM

## 2018-09-19 DIAGNOSIS — R162 Hepatomegaly with splenomegaly, not elsewhere classified: Secondary | ICD-10-CM

## 2018-09-19 DIAGNOSIS — R160 Hepatomegaly, not elsewhere classified: Secondary | ICD-10-CM

## 2018-09-19 DIAGNOSIS — N186 End stage renal disease: Secondary | ICD-10-CM

## 2018-09-19 LAB — RENAL FUNCTION PANEL
Albumin: 3.4 g/dL — ABNORMAL LOW (ref 3.5–5.0)
Anion gap: 13 (ref 5–15)
BUN: 47 mg/dL — ABNORMAL HIGH (ref 8–23)
CO2: 22 mmol/L (ref 22–32)
Calcium: 8.3 mg/dL — ABNORMAL LOW (ref 8.9–10.3)
Chloride: 104 mmol/L (ref 98–111)
Creatinine, Ser: 6.6 mg/dL — ABNORMAL HIGH (ref 0.61–1.24)
GFR calc Af Amer: 9 mL/min — ABNORMAL LOW (ref >60)
GFR calc non Af Amer: 8 mL/min — ABNORMAL LOW (ref >60)
Glucose, Bld: 216 mg/dL — ABNORMAL HIGH (ref 70–99)
Phosphorus: 4.8 mg/dL — ABNORMAL HIGH (ref 2.5–4.6)
Potassium: 3.3 mmol/L — ABNORMAL LOW (ref 3.5–5.1)
Sodium: 139 mmol/L (ref 135–145)

## 2018-09-19 LAB — CBC
HEMATOCRIT: 30.2 % — AB (ref 39.0–52.0)
HEMOGLOBIN: 9.7 g/dL — AB (ref 13.0–17.0)
MCH: 29.8 pg (ref 26.0–34.0)
MCHC: 32.1 g/dL (ref 30.0–36.0)
MCV: 92.6 fL (ref 80.0–100.0)
NRBC: 0 % (ref 0.0–0.2)
Platelets: 47 10*3/uL — ABNORMAL LOW (ref 150–400)
RBC: 3.26 MIL/uL — ABNORMAL LOW (ref 4.22–5.81)
RDW: 14.8 % (ref 11.5–15.5)
WBC: 8.4 10*3/uL (ref 4.0–10.5)

## 2018-09-19 LAB — BASIC METABOLIC PANEL
Anion gap: 15 (ref 5–15)
BUN: 28 mg/dL — ABNORMAL HIGH (ref 8–23)
CO2: 25 mmol/L (ref 22–32)
CREATININE: 4.9 mg/dL — AB (ref 0.61–1.24)
Calcium: 8.5 mg/dL — ABNORMAL LOW (ref 8.9–10.3)
Chloride: 101 mmol/L (ref 98–111)
GFR calc Af Amer: 13 mL/min — ABNORMAL LOW (ref >60)
GFR, EST NON AFRICAN AMERICAN: 12 mL/min — AB (ref >60)
Glucose, Bld: 98 mg/dL (ref 70–99)
Potassium: 3.2 mmol/L — ABNORMAL LOW (ref 3.5–5.1)
Sodium: 141 mmol/L (ref 135–145)

## 2018-09-19 LAB — PARATHYROID HORMONE, INTACT (NO CA): PTH: 256 pg/mL — ABNORMAL HIGH (ref 15–65)

## 2018-09-19 MED ORDER — SODIUM CHLORIDE 0.9 % IV SOLN
125.0000 mg | INTRAVENOUS | Status: DC
  Administered 2018-09-19 – 2018-09-22 (×2): 125 mg via INTRAVENOUS
  Filled 2018-09-19 (×4): qty 10

## 2018-09-19 MED ORDER — HEPARIN SODIUM (PORCINE) 1000 UNIT/ML IJ SOLN
INTRAMUSCULAR | Status: AC
  Administered 2018-09-19: 3900 [IU] via INTRAVENOUS_CENTRAL
  Filled 2018-09-19: qty 4

## 2018-09-19 NOTE — Progress Notes (Signed)
+   thrill in fistula No numbness in hand Follow up with [place] 4-6 weeks Will sign off  Ruta Hinds, MD [facility] [place]: [phone] Pager: [phone]

## 2018-09-19 NOTE — Progress Notes (Signed)
Admit: 09/17/2018 LOS: 2  30M ESRD, relocated to area w/o outpt HD3  Subjective:  . S/p L RC AVF yesterday . CLIP in process . For HD today  01/03 0701 - 01/04 0700 In: 709.2 [P.O.:220; I.V.:239.2; IV Piggyback:250] Out: 10 [Blood:10]  Filed Weights   09/17/18 2224 09/18/18 0300 09/18/18 2039  Weight: 99.2 kg 97 kg 97 kg    Scheduled Meds: . Chlorhexidine Gluconate Cloth  6 each Topical Q0600  . heparin  5,000 Units Subcutaneous Q8H   Continuous Infusions: . sodium chloride 10 mL/hr at 09/18/18 0825   PRN Meds:.acetaminophen **OR** acetaminophen, albuterol, heparin, heparin, oxyCODONE-acetaminophen, senna-docusate  Current Labs: reviewed  Results for YONY, ROULSTON (MRN [id number]) as of 09/19/2018 08:33  Ref. Range 09/17/2018 23:12  Phosphorus Latest Ref Range: 2.5 - 4.6 mg/dL 4.2   PTH, Intact Latest Ref Range: 15 - 65 pg/mL 256 (H)   Results for JOSPEH, MANGEL (MRN [id number]) as of 09/19/2018 08:33  Ref. Range 09/17/2018 23:12  Saturation Ratios Latest Ref Range: 17.9 - 39.5 % 14 (L)  Ferritin Latest Ref Range: 24 - 336 ng/mL 489 (H)    Physical Exam:  Blood pressure 130/68, pulse 100, temperature 98.7 F (37.1 C), temperature source Oral, resp. rate 18, weight 97 kg, SpO2 100 %. GEN: NAD, obese ENT: NCAT EYES: EOMI, anicteric sclera CV: RRR, normal S1 and S2 without murmur or rub PULM: Clear bilaterally, normal work of breathing, speaks in full sentences ABD: Soft, nontender SKIN: No rashes or lesions; left internal jugular tunneled dialysis catheter exit site and tract are clean dry intact without erythema or purulence EXT: No edema  A 1. ESRD, no outpt HD Unit after moving into the area 2. S/p L RC AVF 09/18/17 with VVS 3. Anemia, mild; mild Fe def 4. CKD - BMD: PTH and P at goal 5. CAD hx/o CABG 6. AAA s/p repair 7. COPD, past T user  P . HD today: 2K, 2-3L UF, no heparin, TDC, 4h . Will cont on THS schedule . Await CLIP . 1/2 Fe load . Medication  Issues; o Preferred narcotic agents for pain control are hydromorphone, fentanyl, and methadone. Morphine should not be used.  o Baclofen should be avoided o Avoid oral sodium phosphate and magnesium citrate based laxatives / bowel preps    Pearson Grippe MD 09/19/2018, 8:33 AM  Recent Labs  Lab 09/17/18 1450 09/17/18 2312 09/18/18 0447 09/18/18 1540  NA 145  --  141 138  K 3.9  --  3.2* 4.2  CL 107  --  101 103  CO2 22  --  25 24  GLUCOSE 118*  --  98 166*  BUN 75*  --  28* 33*  CREATININE 9.74*  --  4.90* 5.98*  CALCIUM 9.1  --  8.5* 8.4*  PHOS  --  4.2  --   --    Recent Labs  Lab 09/17/18 1450 09/18/18 0447 09/18/18 1540  WBC 8.4 9.6 7.5  NEUTROABS  --   --  6.9  HGB 10.3* 10.6* 10.3*  HCT 33.5* 33.2* 32.7*  MCV 92.8 90.2 91.1  PLT 40* 30* 30*

## 2018-09-19 NOTE — Progress Notes (Signed)
   Subjective: Patient is feeling well this AM. He is tolerating PO intake. Pain on the left wrist is well controlled. Discussed that we are awaiting placement at an outpatient HD center and he will stay here until a spot is secured. All discussed his thrombocytopenia and further evaluation. He voices understanding. All questions and concerns addressed.   Objective: Vital signs in last 24 hours: Vitals:   09/18/18 1142 09/18/18 1557 09/18/18 2039 09/19/18 0500  BP: 117/73 124/65 129/77 130/68  Pulse: 93 94 (!) 102 100  Resp: 20 19 18 18   Temp: 97.9 F (36.6 C) 97.8 F (36.6 C) 98 F (36.7 C) 98.7 F (37.1 C)  TempSrc: Oral Oral  Oral  SpO2: 97% 99% 99% 100%  Weight:   97 kg    General: Obese male in no acute distress Pulm: Good air movement with no wheezing or crackles  CV: RRR, no murmurs, no rubs  Abdomen: + Hepatosplenomegaly   Assessment/Plan:  Mr. Mckinnon is a 63 yo male with a history of ESRD (HDMWFvia tunneled dialysis catheter since April 2019), a fib (not anticoagulated), CAD s/p CABG in 2017, AAA s/p repair, HTN, HLD, and COPD who was admitted on 1/2 for dialysis after missed sessions.  ESRDon HD MWF - Last HD session 1/02, plan for HD today  - S/p LUE AVF creation 01/03  - Nephrology onboard, working to establish patient without outpatient HD center.   Thrombocytopenia  - Plt consistently < 40k  - Smear reviewed. 1 helmet cell per 10 high power fields, multiple immature large platelets noted. Spherocytes and burr cells noted.  - LDH normal, Coombs negative, LFTs within normal limits, reticulocyte count elevated  - Abdominal US in 2007 with hepatosplenomegaly. Likely sequestration. Will get abdominal imaging today.   HTN - BP at goal since HD  - Not on any medications currently  - Continue to monitor   COPD - Albuterol PRN - Consider PFTs and maintenance therapy in the outpatient setting  Elevated troponin. Insignificant in ESRD. No EKG changes. CAD  s/p CABG. Stable. Not on medications. Will need close outpatient follow-up   Dispo: Medically stable. Awaiting CLIP process.   Ina Homes, MD 09/19/2018, 7:41 AM

## 2018-09-20 DIAGNOSIS — D696 Thrombocytopenia, unspecified: Secondary | ICD-10-CM

## 2018-09-20 NOTE — Progress Notes (Signed)
   Subjective: Patient is doing well this morning without any complaints. He says that his left wrist is not painful. He is tolerating PO intake. He tolerated dialysis yesterday without any complications. We discussed that we are continuing to wait for him to get established with an outpatient hemodialysis center and then will get him discharged. At the earliest this would occur tomorrow. He voices understanding. All questions and concerns addressed.  Objective: Vital signs in last 24 hours: Vitals:   09/19/18 1712 09/19/18 2058 09/20/18 0458 09/20/18 0845  BP: 113/75 114/68 128/64 127/86  Pulse: 88 61 82 92  Resp: 17 20 20 18   Temp: 99 F (37.2 C) 98.1 F (36.7 C) 97.8 F (36.6 C) 97.6 F (36.4 C)  TempSrc: Oral Oral Oral Oral  SpO2: 95% 96% 99% 98%  Weight:  95.1 kg     General: Obese male in no acute distress CV: RRR, no murmurs, no rubs  Extremities: Thrill palpable on the left wrist, bruit heard  Assessment/Plan:  Mr. Cammarata is a 63 yo male with a history of ESRD (HDMWFvia tunneled dialysis catheter since April 2019), a fib (not anticoagulated), CAD s/p CABG in 2017, AAA s/p repair, HTN, HLD, and COPDwho was admitted on 1/2 for dialysis after missed sessions.  ESRDon HD MWF - Last HD session 1/04 - S/p LUE AVF creation 01/03  - Nephrology onboard, working to establish patient without outpatient HD center.   Thrombocytopenia  - Plt consistently < 40k  - Abdominal US with borderline hepatosplenomegaly. Likely sequestration.   HTN - BP at goal since HD  - Not on any medications currently  - Continue to monitor   COPD -Albuterol PRN - Consider PFTs and maintenance therapy in the outpatient setting  Elevated troponin. Insignificant in ESRD. No EKG changes. CAD s/p CABG. Stable. Not on medications. Will need close outpatient follow-up   Dispo: Medically stable. Awaiting CLIP process.   Ina Homes, MD 09/20/2018, 10:48 AM

## 2018-09-20 NOTE — Progress Notes (Signed)
Admit: 09/17/2018 LOS: 3  38M ESRD, relocated to area w/o outpt HD3  Subjective:  . No interval events . Uneventful dialysis yesterday . Bruit and thrill present in AV fistula  01/04 0701 - 01/05 0700 In: 480 [P.O.:480] Out: 2000   Filed Weights   09/18/18 2039 09/19/18 0835 09/19/18 2058  Weight: 97 kg 96.9 kg 95.1 kg    Scheduled Meds: . Chlorhexidine Gluconate Cloth  6 each Topical Q0600  . heparin  5,000 Units Subcutaneous Q8H   Continuous Infusions: . sodium chloride 10 mL/hr at 09/18/18 0825  . ferric gluconate (FERRLECIT/NULECIT) IV Stopped (09/19/18 1314)   PRN Meds:.acetaminophen **OR** acetaminophen, albuterol, heparin, heparin, oxyCODONE-acetaminophen, senna-docusate  Current Labs: reviewed  Results for AGUSTINE, ROSSITTO (MRN [id number]) as of 09/19/2018 08:33  Ref. Range 09/17/2018 23:12  Phosphorus Latest Ref Range: 2.5 - 4.6 mg/dL 4.2   PTH, Intact Latest Ref Range: 15 - 65 pg/mL 256 (H)   Results for SHERIF, MILLSPAUGH (MRN [id number]) as of 09/19/2018 08:33  Ref. Range 09/17/2018 23:12  Saturation Ratios Latest Ref Range: 17.9 - 39.5 % 14 (L)  Ferritin Latest Ref Range: 24 - 336 ng/mL 489 (H)    Physical Exam:  Blood pressure 127/86, pulse 92, temperature 97.6 F (36.4 C), temperature source Oral, resp. rate 18, weight 95.1 kg, SpO2 98 %. GEN: NAD, obese ENT: NCAT EYES: EOMI, anicteric sclera CV: RRR, normal S1 and S2 without murmur or rub PULM: Clear bilaterally, normal work of breathing, speaks in full sentences ABD: Soft, nontender SKIN: No rashes or lesions; left internal jugular tunneled dialysis catheter exit site and tract are clean dry intact without erythema or purulence EXT: No edema L RC AVF +B/T  A 1. ESRD, no outpt HD Unit after moving into the area 2. S/p L RC AVF 09/18/17 with VVS 3. Anemia, mild; mild Fe def 4. CKD - BMD: PTH and P at goal 5. CAD hx/o CABG 6. AAA s/p repair 7. COPD, past T user 8. TCP, hold heparin with HD  P . Will HD cont  on THS schedule . Await CLIP . 1/2 Fe load in process . Medication Issues; o Preferred narcotic agents for pain control are hydromorphone, fentanyl, and methadone. Morphine should not be used.  o Baclofen should be avoided o Avoid oral sodium phosphate and magnesium citrate based laxatives / bowel preps    Pearson Grippe MD 09/20/2018, 1:41 PM  Recent Labs  Lab 09/17/18 2312 09/18/18 0447 09/18/18 1540 09/19/18 0713  NA  --  141 138 139  K  --  3.2* 4.2 3.3*  CL  --  101 103 104  CO2  --  25 24 22   GLUCOSE  --  98 166* 216*  BUN  --  28* 33* 47*  CREATININE  --  4.90* 5.98* 6.60*  CALCIUM  --  8.5* 8.4* 8.3*  PHOS 4.2  --   --  4.8*   Recent Labs  Lab 09/18/18 0447 09/18/18 1540 09/19/18 0719  WBC 9.6 7.5 8.4  NEUTROABS  --  6.9  --   HGB 10.6* 10.3* 9.7*  HCT 33.2* 32.7* 30.2*  MCV 90.2 91.1 92.6  PLT 30* 30* 47*

## 2018-09-21 ENCOUNTER — Encounter (HOSPITAL_COMMUNITY): Payer: Self-pay | Admitting: Vascular Surgery

## 2018-09-21 MED ORDER — CHLORHEXIDINE GLUCONATE CLOTH 2 % EX PADS
6.0000 | MEDICATED_PAD | Freq: Every day | CUTANEOUS | Status: DC
  Administered 2018-09-22: 6 via TOPICAL

## 2018-09-21 NOTE — Progress Notes (Addendum)
[facility] Progress Note   Subjective:   Seen and examined at bedside. No specific complaints.  No events overnight.  Denes CP, SOB, n/v/d.  Objective Vitals:   09/20/18 1735 09/20/18 2024 09/21/18 0440 09/21/18 0853  BP: (!) 152/88 (!) 140/92 (!) 133/107 (!) 135/102  Pulse: 75 90 88 80  Resp: 18 20 20 18   Temp: 97.8 F (36.6 C) (!) 97.5 F (36.4 C) 98.3 F (36.8 C) 98.1 F (36.7 C)  TempSrc: Oral Oral Oral Oral  SpO2: 98% 98% 97% 98%  Weight:   95.1 kg    Physical Exam General:NAD, WDWN male Heart:RRR, no MRG Lungs:CTAB Abdomen:soft, obese, NTND Extremities:no LE edema, L TMA Dialysis Access: L IJ TDC,  L RC AVF +b/t placed on 09/18/2018  Filed Weights   09/19/18 0835 09/19/18 2058 09/21/18 0440  Weight: 96.9 kg 95.1 kg 95.1 kg    Intake/Output Summary (Last 24 hours) at 09/21/2018 1545 Last data filed at 09/21/2018 1252 Gross per 24 hour  Intake 1680 ml  Output 0 ml  Net 1680 ml    Additional Objective Labs: Basic Metabolic Panel: Recent Labs  Lab 09/17/18 2312 09/18/18 0447 09/18/18 1540 09/19/18 0713  NA  --  141 138 139  K  --  3.2* 4.2 3.3*  CL  --  101 103 104  CO2  --  25 24 22   GLUCOSE  --  98 166* 216*  BUN  --  28* 33* 47*  CREATININE  --  4.90* 5.98* 6.60*  CALCIUM  --  8.5* 8.4* 8.3*  PHOS 4.2  --   --  4.8*   Liver Function Tests: Recent Labs  Lab 09/18/18 1540 09/19/18 0713  AST 16  --   ALT 15  --   ALKPHOS 64  --   BILITOT 0.6  --   PROT 7.0  --   ALBUMIN 3.8 3.4*   CBC: Recent Labs  Lab 09/17/18 1450 09/18/18 0447 09/18/18 1540 09/19/18 0719  WBC 8.4 9.6 7.5 8.4  NEUTROABS  --   --  6.9  --   HGB 10.3* 10.6* 10.3* 9.7*  HCT 33.5* 33.2* 32.7* 30.2*  MCV 92.8 90.2 91.1 92.6  PLT 40* 30* 30* 47*   Cardiac Enzymes: Recent Labs  Lab 09/17/18 1450 09/17/18 2059 09/18/18 0447  TROPONINI 0.06* 0.08* 0.08*    Lab Results  Component Value Date   INR 1.18 09/18/2018   Studies/Results: No results  found.  Medications: . sodium chloride 10 mL/hr at 09/18/18 0825  . ferric gluconate (FERRLECIT/NULECIT) IV Stopped (09/19/18 1314)   . Chlorhexidine Gluconate Cloth  6 each Topical Q0600    Dialysis Orders: Awaiting CLIP   Assessment/Plan: 1. ESRD - no OP HD unit established, just moved back to the area.  CLIP started.  Orders written for HD tomorrow. Continue on TTS schedule for now as this will likely be his OP schedule. No heparin. K 3.3, use 4K bath while admitted.  2. S/p L RC AVF placed on 09/18/2018 by Dr. Donnetta Hutching. F/u in 6 weeks.  3. Anemia of CKD- Hgb 9.7.  Receiving Fe load.  4. Secondary hyperparathyroidism - Ca, phos and pth in goal. 5. HTN/volume - BP elevated.  Expect improvement with HD.  Does not appear grossly volume overloaded on exam.  Plan for HD tomorrow with net UF goal 3L.  6. Nutrition - Alb 3.4. Renal diet w/fluid restrictions.  7. CAD Hx CABG 9. AAA s/p repair 10. COPD 60. Hx thrombocytopenia -  hold Heparin w/HD.  Checking HIT screen.   Jen Mow, PA-C [facility] Pager: [phone] 09/21/2018,3:45 PM  LOS: 4 days   Pt seen, examined and agree w A/P as above.  Kelly Splinter MD [employer] pager [phone]   09/21/2018, 4:55 PM

## 2018-09-21 NOTE — Discharge Summary (Addendum)
Name: Larry Klein MRN: [id number] DOB: August 06, 1956 64 y.o. PCP: Patient, No Pcp Per  Date of Admission: 09/17/2018  2:31 PM Date of Discharge: 09/23/2018 Attending Physician: Dr. Murriel Hopper  Discharge Diagnosis: 1. ESRD 2. Chronic thrombocytopenia 3. CAD s/p CABG 4. HTN 5. COPD  Discharge Medications: Allergies as of 09/23/2018   No Known Allergies     Medication List    TAKE these medications   acetaminophen 500 MG tablet Commonly known as:  TYLENOL Take 1,000 mg by mouth as needed for mild pain or headache.   albuterol 108 (90 Base) MCG/ACT inhaler Commonly known as:  PROVENTIL HFA;VENTOLIN HFA Inhale 2 puffs into the lungs every 6 (six) hours as needed for wheezing.   calcium carbonate 500 MG chewable tablet Commonly known as:  TUMS - dosed in mg elemental calcium Chew 2 tablets by mouth 3 (three) times daily before meals.   ipratropium-albuterol 0.5-2.5 (3) MG/3ML Soln Commonly known as:  DUONEB Take 3 mLs by nebulization every 6 (six) hours as needed (wheezing).      Disposition and follow-up:   [name] was discharged from [facility] in Good condition.  At the hospital follow up visit please address:  1. ESRD - Left radiocephalic AVF created - Using tunneled catheter until AVF matures for HD access - TTS dialysis schedule - F/u with vascular surgery in 6 weeks  2. CAD s/p CABG - CABG in 2017. - Patient on no medications. - Please consider starting aspirin and statin in outpatient setting.  3. COPD - Patient currently uses albuterol nebulizer four times a day. - Patient has never undergone PFTs or been on maintenance therapy. - Please consider PFTs and maintenance inhalers  4. Thrombocytopenia - Platelets ranged from 30s-40s - Likely familial as he has had this problem since he was a teenager - HIT antibody negative. - Consider following platelet count   5.  Labs / imaging needed at time of follow-up: PFTs,  CBC  6.  Pending labs/ test needing follow-up: None  Follow-up Appointments: Follow-up Information    [facility] In 6 weeks.   Specialty:  Vascular Surgery Why:  Office will call you to arrange your appt (sent) Contact information: [place] by problem list: 1. ESRD: Larry Klein is a 63 yo male with a history of ESRD (on HD since 12/2017), a fib (not anticoagulated), CAD s/p CABG in 2017, AAA s/p repair, HTN, HLD, and COPD who was admitted on 09/17/2018 for dialysis after missed sessions. He used to live in [place] where he underwent HD MWF by tunneled catheter. He recently moved to [place], [place] and has not established with an outpatient dialysis center. He presented with dyspnea and volume overload 2/2 missed dialysis sessions. He was dialyzed in the hospital and placed on a TThS schedule. He had a left radiocephalic AVF created on 1/3. He has been using the tunneled catheter for HD while his AVF matures. Nephrology connected him to [facility] outpatient dialysis center where he will continue HD and follow-up with Nephrology. Patient plans to establish care with a PCP in the [place] area.  2. Chronic thrombocytopenia: Platelets consistently below 50. Patient reported history of thrombocytopenia when he was a teen. He had a bone marrow biopsy at this time, which was normal. Differential includes splenic sequestration, heparin-induced thrombocytopenia, H. Pylori infection, MDS, chronic ITP, and familial thrombocytopenia. Abdominal US showed  no hepatosplenomegaly. He takes no medications at home that are likely to cause thrombocytopenia. He has no symptoms of H. Pylori. His chronic anemia is attributable to his ESRD. The most likely cause is familial as he was thrombocytopenic in his teens with normal bone marrow biopsy. HIT was ruled out with negative antibodies.   3. CAD s/p CABG: Pt  underwent CABG in 2017. Patient not currently on any medications. No chest pain. No ischemic changes on EKG. Please consider starting aspirin and statin in outpatient setting.  4. HTN: Well-controlled with dialysis alone. Not on any antihypertensive medications.   5. COPD: Patient currently uses albuterol nebulizer four times a day. Patient has never undergone PFTs or been on maintenance therapy. Please consider PFTs and maintenance inhalers in the outpatient setting.   Discharge Vitals:   BP 140/75 (BP Location: Right Arm)   Pulse 73   Temp 97.6 F (36.4 C) (Oral)   Resp 19   Ht '5\' 9"'$  (1.753 m)   Wt 94.1 kg   SpO2 96%   BMI 30.64 kg/m   Pertinent Labs, Studies, and Procedures:  CBC Latest Ref Rng & Units 09/22/2018 09/19/2018 09/18/2018  WBC 4.0 - 10.5 K/uL 9.4 8.4 7.5  Hemoglobin 13.0 - 17.0 g/dL 9.8(L) 9.7(L) 10.3(L)  Hematocrit 39.0 - 52.0 % 32.0(L) 30.2(L) 32.7(L)  Platelets 150 - 400 K/uL 53(L) 47(L) 30(L)   CMP Latest Ref Rng & Units 09/22/2018 09/19/2018 09/18/2018  Glucose 70 - 99 mg/dL 95 216(H) 166(H)  BUN 8 - 23 mg/dL 57(H) 47(H) 33(H)  Creatinine 0.61 - 1.24 mg/dL 6.97(H) 6.60(H) 5.98(H)  Sodium 135 - 145 mmol/L 142 139 138  Potassium 3.5 - 5.1 mmol/L 3.2(L) 3.3(L) 4.2  Chloride 98 - 111 mmol/L 103 104 103  CO2 22 - 32 mmol/L '24 22 24  '$ Calcium 8.9 - 10.3 mg/dL 8.7(L) 8.3(L) 8.4(L)  Total Protein 6.5 - 8.1 g/dL - - 7.0  Total Bilirubin 0.3 - 1.2 mg/dL - - 0.6  Alkaline Phos 38 - 126 U/L - - 64  AST 15 - 41 U/L - - 16  ALT 0 - 44 U/L - - 15   Abdominal US 1/4 1. No acute findings. 2. Liver and spleen both top-normal in size. 3. Bilateral echogenic kidneys, with a small right kidney, consistent with medical renal disease. Nonobstructing left intrarenal stone.  Discharge Instructions: Discharge Instructions    Diet - low sodium heart healthy   Complete by:  As directed    Discharge instructions   Complete by:  As directed    Thank you for allowing [place] to provide your  care. You have been set up for outpatient dialysis at [facility] . Your first treatment will be treatment Thursday ,September 24 2018 at 09:00am. Normal schedule Tuesday,Thursday,Saturday .Chair time at 06:10am. Please be there tomorrow at 09:00am to sign paperwork.   It will be very important that you schedule follow-up with her primary care provider as soon as possible.   Increase activity slowly   Complete by:  As directed     Signed: Ina Homes, MD 09/23/2018, 11:40 AM   Pager: [phone]

## 2018-09-21 NOTE — Progress Notes (Signed)
Medicine attending: Lab and imaging done to evaluate thrombocytopenia: No signs of an acute hemolytic process despite preliminary findings on review of the peripheral blood film.  Coombs testing is negative.  No schistocytes.  LDH normal. Abdominal ultrasound does not show splenomegaly, hepatomegaly, signs of chronic liver disease, or portal hypertension to suggest that thrombocytopenia is related to these. He is not on any offending medications except, perhaps, for heparin given with his dialysis.  This is temporarily on hold. Platelet count today better than recent baseline at 47,000.  Intermediate pretest probability that this is a heparin related phenomenon.  We will go ahead and check a HIT screen. Patient tells [place] today that he had a bone marrow biopsy done when he was a teenager.  He does not know the reason why.  He was also told at the time of cardiac surgery in 2017 that he had a low platelet count.  This suggest a congenital rather than acquired problem. There are no signs of active bleeding.  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 Renal status is stable on dialysis.  Determination of a dialysis center near his home in progress. Continue current management.

## 2018-09-21 NOTE — Anesthesia Postprocedure Evaluation (Signed)
Anesthesia Post Note  Patient: Emmanuelle Coxe  Procedure(s) Performed: ARTERIOVENOUS FISTULA LEFT ARM (Left )     Patient location during evaluation: PACU Anesthesia Type: General Level of consciousness: awake and alert Pain management: pain level controlled Vital Signs Assessment: post-procedure vital signs reviewed and stable Respiratory status: spontaneous breathing, nonlabored ventilation, respiratory function stable and patient connected to nasal cannula oxygen Cardiovascular status: blood pressure returned to baseline and stable Postop Assessment: no apparent nausea or vomiting Anesthetic complications: no    Last Vitals:  Vitals:   09/20/18 2024 09/21/18 0440  BP: (!) 140/92 (!) 133/107  Pulse: 90 88  Resp: 20 20  Temp: (!) 36.4 C 36.8 C  SpO2: 98% 97%    Last Pain:  Vitals:   09/21/18 0440  TempSrc: Oral  PainSc:                  Catalina Gravel

## 2018-09-21 NOTE — Progress Notes (Addendum)
   Subjective: Larry Klein was seen sitting comfortably at the bedside this morning. No overnight events. Discussed his history of thrombocytopenia. He denies alcohol use. He said he was first told about his low platelets when he was in his teens. He even had a bone marrow biopsy at that time which showed nothing. He was also told his platelets were low when he had a heart cath in March of 2017 in [place]. He was told by previous providers that his thrombocytopenia is due to his spleen. He reports that he feels well today and has no new symptoms or concerns.  Objective: Vital signs in last 24 hours: Vitals:   09/20/18 0845 09/20/18 1735 09/20/18 2024 09/21/18 0440  BP: 127/86 (!) 152/88 (!) 140/92 (!) 133/107  Pulse: 92 75 90 88  Resp: '18 18 20 20  '$ Temp: 97.6 F (36.4 C) 97.8 F (36.6 C) (!) 97.5 F (36.4 C) 98.3 F (36.8 C)  TempSrc: Oral Oral Oral Oral  SpO2: 98% 98% 98% 97%  Weight:       General: Obese male in no acute distress CV: RRR, no murmurs, no rubs  Abd: soft, non-distended, non-tender. No hepatosplenomegaly. Ext: trace peripheral edema BLE.  Assessment/Plan:  Principal Problem:   ESRD (end stage renal disease) on dialysis [facility]) Active Problems:   Shortness of breath   Hepatomegaly   Thrombocytopenia [facility])  Larry Klein is a 63 yo male with a history of ESRD (HDMWFvia tunneled dialysis catheter since April 2019), a fib (not anticoagulated), CAD s/p CABG in 2017, AAA s/p repair, HTN, HLD, and COPDwho was admitted on 1/2 for dialysis after missed sessions.  ESRDon HD MWF -Last HD session 1/04 - S/p LUE AVF creation 01/03  Plan - Nephrology onboard, working to establish patient without outpatient HD center.   Thrombocytopenia - Platelets 47,000 yesterday - Ddx includes splenic sequestration, heparin-induced thrombocytopenia, H. Pylori infection, MDS, chronic ITP, and familial thrombocytopenia. Abdominal US showed no hepatosplenomegaly. He takes no  medications at home that are likely to cause thrombocytopenia. He has no symptoms of H. Pylori. His anemia is attributable to his ESRD. The most likely cause is familial as he was thrombocytopenic in his teens with normal bone marrow biopsy. However, cannot rule out HIT and will therefore avoid heparin and get the HIT antibody. - He does not require any treatment now as he has no signs or symptoms of bleeding and his platelets are above 30.  Plan - Continue to monitor platelet count - Discontinue VTE ppx with heparin and switch to SCDs - HIT antibody   HTN -BP at goal since HD  - Not on any medications currently  Plan - Continue to monitor  COPD -Albuterol PRN - Consider PFTs and maintenance therapy in the outpatient setting  Elevated troponin. Insignificant in ESRD. No EKG changes. CAD s/p CABG. Stable. Not on medications. Will need close outpatient follow-up  Dispo:Medically stable. Awaiting CLIP process.  Fernand Sorbello, Andree Elk, MD 09/21/2018, 6:23 AM Pager: [phone]

## 2018-09-22 LAB — RENAL FUNCTION PANEL
Albumin: 3.4 g/dL — ABNORMAL LOW (ref 3.5–5.0)
Anion gap: 15 (ref 5–15)
BUN: 57 mg/dL — AB (ref 8–23)
CALCIUM: 8.7 mg/dL — AB (ref 8.9–10.3)
CO2: 24 mmol/L (ref 22–32)
Chloride: 103 mmol/L (ref 98–111)
Creatinine, Ser: 6.97 mg/dL — ABNORMAL HIGH (ref 0.61–1.24)
GFR calc Af Amer: 9 mL/min — ABNORMAL LOW (ref >60)
GFR, EST NON AFRICAN AMERICAN: 8 mL/min — AB (ref >60)
Glucose, Bld: 95 mg/dL (ref 70–99)
Phosphorus: 5.5 mg/dL — ABNORMAL HIGH (ref 2.5–4.6)
Potassium: 3.2 mmol/L — ABNORMAL LOW (ref 3.5–5.1)
Sodium: 142 mmol/L (ref 135–145)

## 2018-09-22 LAB — CBC
HCT: 32 % — ABNORMAL LOW (ref 39.0–52.0)
Hemoglobin: 9.8 g/dL — ABNORMAL LOW (ref 13.0–17.0)
MCH: 28.5 pg (ref 26.0–34.0)
MCHC: 30.6 g/dL (ref 30.0–36.0)
MCV: 93 fL (ref 80.0–100.0)
Platelets: 53 10*3/uL — ABNORMAL LOW (ref 150–400)
RBC: 3.44 MIL/uL — ABNORMAL LOW (ref 4.22–5.81)
RDW: 14.8 % (ref 11.5–15.5)
WBC: 9.4 10*3/uL (ref 4.0–10.5)
nRBC: 0 % (ref 0.0–0.2)

## 2018-09-22 LAB — HEPARIN INDUCED PLATELET AB (HIT ANTIBODY): Heparin Induced Plt Ab: 0.225 OD (ref 0.000–0.400)

## 2018-09-22 LAB — HEPATITIS B E ANTIGEN: Hep B E Ag: NEGATIVE

## 2018-09-22 LAB — HEPATITIS B SURFACE ANTIGEN: Hepatitis B Surface Ag: NEGATIVE

## 2018-09-22 MED ORDER — SODIUM CHLORIDE 0.9 % IV SOLN: 100.0000 mL | INTRAVENOUS | Status: DC | PRN

## 2018-09-22 MED ORDER — ACETAMINOPHEN 325 MG PO TABS
ORAL_TABLET | ORAL | Status: AC
  Administered 2018-09-22: 650 mg via ORAL
  Filled 2018-09-22: qty 2

## 2018-09-22 MED ORDER — LIDOCAINE-PRILOCAINE 2.5-2.5 % EX CREA: 1.0000 "application " | TOPICAL_CREAM | CUTANEOUS | Status: DC | PRN

## 2018-09-22 MED ORDER — HEPARIN SODIUM (PORCINE) 1000 UNIT/ML DIALYSIS
1000.0000 [IU] | INTRAMUSCULAR | Status: DC | PRN
  Administered 2018-09-22: 1000 [IU] via INTRAVENOUS_CENTRAL
  Filled 2018-09-22: qty 1

## 2018-09-22 MED ORDER — HEPARIN SODIUM (PORCINE) 1000 UNIT/ML IJ SOLN
INTRAMUSCULAR | Status: AC
  Administered 2018-09-22: 1000 [IU] via INTRAVENOUS_CENTRAL
  Filled 2018-09-22: qty 4

## 2018-09-22 MED ORDER — PENTAFLUOROPROP-TETRAFLUOROETH EX AERO: 1.0000 "application " | INHALATION_SPRAY | CUTANEOUS | Status: DC | PRN

## 2018-09-22 MED ORDER — ALTEPLASE 2 MG IJ SOLR: 2.0000 mg | Freq: Once | INTRAMUSCULAR | Status: DC | PRN

## 2018-09-22 MED ORDER — LIDOCAINE HCL (PF) 1 % IJ SOLN: 5.0000 mL | INTRAMUSCULAR | Status: DC | PRN

## 2018-09-22 NOTE — Progress Notes (Addendum)
[facility] Progress Note   Subjective:   Seen and examined at bedside in HD.  Tolerating dialysis well.  No specific complaints.  Denies CP, SOB, n/v/d, weakness and dizziness.   Objective Vitals:   09/22/18 0945 09/22/18 1000 09/22/18 1030 09/22/18 1100  BP: 107/63 138/68 (!) 118/57 108/63  Pulse: 67 76 71 64  Resp:      Temp:      TempSrc:      SpO2:      Weight:      Height:       Physical Exam General:NAD, WDWN, pleasant male Heart:RRR Lungs:CTAB, breathing non labored  Abdomen:soft, obese Extremities:no LE edema, L TMA Dialysis Access: L IJ TDC accessed, L RC AVF maturing  Filed Weights   09/21/18 0440 09/21/18 2108 09/22/18 0710  Weight: 95.1 kg 95.8 kg 95.6 kg    Intake/Output Summary (Last 24 hours) at 09/22/2018 1205 Last data filed at 09/22/2018 0600 Gross per 24 hour  Intake 1200 ml  Output -  Net 1200 ml    Additional Objective Labs: Basic Metabolic Panel: Recent Labs  Lab 09/17/18 2312  09/18/18 1540 09/19/18 0713 09/22/18 0710  NA  --    < > 138 139 142  K  --    < > 4.2 3.3* 3.2*  CL  --    < > 103 104 103  CO2  --    < > 24 22 24   GLUCOSE  --    < > 166* 216* 95  BUN  --    < > 33* 47* 57*  CREATININE  --    < > 5.98* 6.60* 6.97*  CALCIUM  --    < > 8.4* 8.3* 8.7*  PHOS 4.2  --   --  4.8* 5.5*   < > = values in this interval not displayed.   Liver Function Tests: Recent Labs  Lab 09/18/18 1540 09/19/18 0713 09/22/18 0710  AST 16  --   --   ALT 15  --   --   ALKPHOS 64  --   --   BILITOT 0.6  --   --   PROT 7.0  --   --   ALBUMIN 3.8 3.4* 3.4*   CBC: Recent Labs  Lab 09/17/18 1450 09/18/18 0447 09/18/18 1540 09/19/18 0719 09/22/18 0813  WBC 8.4 9.6 7.5 8.4 9.4  NEUTROABS  --   --  6.9  --   --   HGB 10.3* 10.6* 10.3* 9.7* 9.8*  HCT 33.5* 33.2* 32.7* 30.2* 32.0*  MCV 92.8 90.2 91.1 92.6 93.0  PLT 40* 30* 30* 47* 53*    Cardiac Enzymes: Recent Labs  Lab 09/17/18 1450 09/17/18 2059 09/18/18 0447   TROPONINI 0.06* 0.08* 0.08*   Studies/Results: No results found.  Medications: . sodium chloride 10 mL/hr at 09/18/18 0825  . ferric gluconate (FERRLECIT/NULECIT) IV Stopped (09/22/18 1139)   . Chlorhexidine Gluconate Cloth  6 each Topical Q0600  . Chlorhexidine Gluconate Cloth  6 each Topical Q0600    Dialysis Orders: CLIP in process  Assessment/Plan: 1. ESRD - no OP HD unit established, just moved back to the area.  CLIP started, awaiting records from nephrologist in [place].  HD today. Continue on TTS schedule for now as this will likely be his OP schedule. No heparin. K 3.2, use 4K bath while admitted.  2. S/p L RC AVF placed on 09/18/2018 by Dr. Donnetta Hutching. F/u in 6 weeks.  3. Anemia of CKD- Hgb 9.8.  Receiving Fe load.  4. Secondary hyperparathyroidism - Ca, phos and pth in goal. 5. HTN/volume - BP in goal today. Appears euvolemic on exam.  Weight post HD is likely EDW.  6. Nutrition - Alb 3.4. Renal diet w/fluid restrictions.  7. CAD Hx CABG 9. AAA s/p repair 10. COPD 73. Hx thrombocytopenia - hold Heparin w/HD.  Checking HIT screen.  Jen Mow, PA-C [facility] Pager: [phone] 09/22/2018,12:05 PM  LOS: 5 days   Pt seen, examined and agree w A/P as above.  Kelly Splinter MD [employer] pager [phone]   09/22/2018, 1:05 PM

## 2018-09-22 NOTE — Progress Notes (Addendum)
   Subjective: Patient was seen during HD today. He reported feeling well. No acute complaints or overnight events.   Objective:  Vital signs in last 24 hours: Vitals:   09/21/18 2027 09/21/18 2108 09/22/18 0102 09/22/18 0321  BP: (!) 122/96   (!) 147/93  Pulse: 91   96  Resp: 18   18  Temp: 97.7 F (36.5 C)   98.2 F (36.8 C)  TempSrc: Oral   Oral  SpO2: 100%   99%  Weight:  95.8 kg    Height:   5\' 9"  (1.753 m)    General:Obese malein no acute distress CV:RRR, no murmurs, no rubs  Abd: soft, non-distended, non-tender. No hepatosplenomegaly. Ext: trace peripheral edema BLE.  Assessment/Plan:  Principal Problem:   ESRD (end stage renal disease) on dialysis [facility]) Active Problems:   Shortness of breath   Hepatomegaly   Thrombocytopenia (HCC)  ESRDon HD TThSa -Currently undergoing dialysis today - S/p LUE AVF creation 01/03  Plan - HD today - Nephrology onboard, working to establish patient with outpatient HD center.   Thrombocytopenia - Platelet count increased to 53 today - Most likely familial, however cannot rule out HIT. Plan - Avoid heparin for VTE ppx and with dialysis - F/u HIT antibody  Dispo:Medically stable. Awaiting CLIP process.  Kaycee Mcgaugh, Andree Elk, MD 09/22/2018, 6:25 AM Pager: [phone]

## 2018-09-23 LAB — HEPATITIS B SURFACE ANTIGEN: Hepatitis B Surface Ag: NEGATIVE

## 2018-09-23 NOTE — Progress Notes (Signed)
Larry Klein Progress Note   Subjective:   Patient seen and examined at bedside in room.  Reports intermittent SOB, but currently ok. Believes it may be related to COPD. Denies CP, n/v/d, abdominal pain, dizziness and weakness. Would like recommendations for new PCP, pulmonologist, and cardiologist in [place].    Objective Vitals:   09/22/18 1812 09/22/18 2130 09/23/18 0504 09/23/18 0914  BP: 112/64 (!) 141/69 115/69 140/75  Pulse: 75 77 63 73  Resp: 18 18 18 19   Temp: 98.9 F (37.2 C) 98.1 F (36.7 C) 97.9 F (36.6 C) 97.6 F (36.4 C)  TempSrc: Oral Oral Oral Oral  SpO2: 97% 98% 98% 96%  Weight:  94.1 kg    Height:       Physical Exam General:NAD, chronically ill appearing, pleasant male Heart:RRR, no mrg Lungs:CTAB Abdomen:soft, NTND Extremities:no LE edema Dialysis Access: L IJ TDC, LUE AVF maturing   Filed Weights   09/22/18 0710 09/22/18 1120 09/22/18 2130  Weight: 95.6 kg 94.1 kg 94.1 kg    Intake/Output Summary (Last 24 hours) at 09/23/2018 1231 Last data filed at 09/23/2018 0800 Gross per 24 hour  Intake 710 ml  Output 0 ml  Net 710 ml    Additional Objective Labs: Basic Metabolic Panel: Recent Labs  Lab 09/17/18 2312  09/18/18 1540 09/19/18 0713 09/22/18 0710  NA  --    < > 138 139 142  K  --    < > 4.2 3.3* 3.2*  CL  --    < > 103 104 103  CO2  --    < > 24 22 24   GLUCOSE  --    < > 166* 216* 95  BUN  --    < > 33* 47* 57*  CREATININE  --    < > 5.98* 6.60* 6.97*  CALCIUM  --    < > 8.4* 8.3* 8.7*  PHOS 4.2  --   --  4.8* 5.5*   < > = values in this interval not displayed.   Liver Function Tests: Recent Labs  Lab 09/18/18 1540 09/19/18 0713 09/22/18 0710  AST 16  --   --   ALT 15  --   --   ALKPHOS 64  --   --   BILITOT 0.6  --   --   PROT 7.0  --   --   ALBUMIN 3.8 3.4* 3.4*   CBC: Recent Labs  Lab 09/17/18 1450 09/18/18 0447 09/18/18 1540 09/19/18 0719 09/22/18 0813  WBC 8.4 9.6 7.5 8.4 9.4  NEUTROABS  --    --  6.9  --   --   HGB 10.3* 10.6* 10.3* 9.7* 9.8*  HCT 33.5* 33.2* 32.7* 30.2* 32.0*  MCV 92.8 90.2 91.1 92.6 93.0  PLT 40* 30* 30* 47* 53*   Cardiac Enzymes: Recent Labs  Lab 09/17/18 1450 09/17/18 2059 09/18/18 0447  TROPONINI 0.06* 0.08* 0.08*   Studies/Results: No results found.  Medications: . sodium chloride 10 mL/hr at 09/18/18 0825  . ferric gluconate (FERRLECIT/NULECIT) IV Stopped (09/22/18 1139)   . Chlorhexidine Gluconate Cloth  6 each Topical Q0600  . Chlorhexidine Gluconate Cloth  6 each Topical Q0600    Dialysis Orders: CLIP in process  Assessment/Plan: 1. ESRD -On TTS schedule. Just moved from [place]. K 3.2. HD tolerated well yesterday.  No Heparin d/t thrombocytopenia. Patient has been CLIP to [facility] with 1st HD tomorrow at 9am.  Will send HD orders to unit.  2. S/p  L RC AVF placed on 09/18/2018 by Dr. Donnetta Hutching. F/u in 6 weeks.  3. Anemia of CKD-Hgb 9.8. Receiving Fe load.  4. Secondary hyperparathyroidism -Ca, phos and pth in goal. 5. HTN/volume -BP in goal.Appears euvolemic on exam.  post weight 94.1kg. 6. Nutrition -Alb 3.4. Renal diet w/fluid restrictions. Protein supplements. 7. CAD Hx CABG 9. AAA s/p repair 10. COPD 1. Hx thrombocytopenia - hold Heparin w/HD. Checking HIT screen. 12. Dispo - ok to d/c from renal standpoint.  OP dialysis schedule established at [facility] w/1st HD tomorrow AM.  Will ask OP SW to provide a list of area physicians.   Jen Mow, PA-C [facility] Pager: [phone] 09/23/2018,12:31 PM  LOS: 6 days

## 2018-09-23 NOTE — Progress Notes (Signed)
   Subjective: Patient seen sitting up comfortably in bed. He was informed his platelets were in a safe range. He complained of intermittent right side/flank pain. He said the pain radiates all the way across his belly. He feels when the pain comes on his blood pressure falls. He denies any hematuria, dysuria, or kidney stones. He is still producing urine. He said tylenol takes time to work. Discussed that we are still awaiting placement on an outpatient hemodialysis center. All questions and concerns addressed.  Objective: Vital signs in last 24 hours: Vitals:   09/22/18 1120 09/22/18 1812 09/22/18 2130 09/23/18 0504  BP: 117/68 112/64 (!) 141/69 115/69  Pulse: 96 75 77 63  Resp: 20 18 18 18   Temp: 97.9 F (36.6 C) 98.9 F (37.2 C) 98.1 F (36.7 C) 97.9 F (36.6 C)  TempSrc: Oral Oral Oral Oral  SpO2: 100% 97% 98% 98%  Weight: 94.1 kg  94.1 kg   Height:       General: Obese male in no acute distress Pulm: Good air movement with no wheezing or crackles  CV: RRR, no murmurs, no rubs  Abdomen: Active bowel sounds, soft, non-distended, + hepatomegaly  Assessment/Plan:  Principal Problem:   ESRD (end stage renal disease) on dialysis [facility]) Active Problems:   Shortness of breath   Hepatomegaly   Thrombocytopenia [facility])  Mr. Novelo is a 63 yo male with a history of ESRD (HDMWFvia tunneled dialysis catheter since April 2019), a fib (not anticoagulated), CAD s/p CABG in 2017, AAA s/p repair, HTN, HLD, and COPDwho was admitted on 1/2 for dialysis after missed sessions.  ESRDon HD TThSa -Last dialysis 1/7 - S/p LUE AVF creation 01/03 Plan - HD tomorrow - Nephrology onboard, working to establish patient with outpatient HD center.  Thrombocytopenia - Platelet count 53 yesterday - Hepatitis B and E negative - HIT antibody panel negative. - Most likely familial. Plan  - Monitor for bleeding  Abdominal Pain, Intermittent  - Pain has resolved - PE unrevealing  Plan -  Continue to monitor   Dispo:Medically stable. Awaiting CLIP process.  Ina Homes, MD 09/23/2018, 8:48 AM Pager: [phone]

## 2018-09-23 NOTE — Progress Notes (Signed)
Accepted at [facility] Thursday ,September 24 2018 at 09:00am Normal schedule Tuesday,Thursday,Saturday .Chair time at 06:10am ..Needs to be there at 09:00am to sign paperwork

## 2018-09-23 NOTE — Care Management Note (Addendum)
Case Management Note Manya Silvas, RN MSN CCM Transitions of Care 63M [place] [phone]  Patient Details  Name: Odis Turck MRN: [id number] Date of Birth: 08-Sep-1956  Subjective/Objective:           ESRD         Action/Plan: PTA home with family. Recent move from [place]. Required arrangements for outpatient hemodialysis and permanent access placement. For PCP follow up, patient wants to schedule his appointment with [facility] in [place]. Number provided on AVS and discussed with patient. Development worker, community following. No other transition of care needs identified. Patient to transition home today.  Expected Discharge Date:  09/23/18               Expected Discharge Plan:  Home/Self Care  In-House Referral:  Clinical Social Work, Development worker, community, PCP / Chief Executive Officer  CM Consult  Post Acute Care Choice:  NA Choice offered to:  NA  DME Arranged:  N/A DME Agency:  NA  HH Arranged:  NA HH Agency:  NA  Status of Service:  Completed, signed off  If discussed at [employer], dates discussed:    Additional Comments:  Bartholomew Crews, RN 09/23/2018, 2:27 PM

## 2018-09-25 ENCOUNTER — Other Ambulatory Visit: Payer: Self-pay

## 2018-09-25 DIAGNOSIS — N186 End stage renal disease: Secondary | ICD-10-CM

## 2018-09-25 DIAGNOSIS — Z992 Dependence on renal dialysis: Principal | ICD-10-CM

## 2018-09-25 DIAGNOSIS — Z48812 Encounter for surgical aftercare following surgery on the circulatory system: Secondary | ICD-10-CM

## 2018-10-28 ENCOUNTER — Encounter: Payer: Self-pay | Admitting: Family

## 2018-10-28 ENCOUNTER — Inpatient Hospital Stay (HOSPITAL_COMMUNITY): Admission: RE | Admit: 2018-10-28 | Payer: Self-pay | Source: Ambulatory Visit

## 2018-11-12 ENCOUNTER — Emergency Department (HOSPITAL_COMMUNITY): Payer: Medicare Other

## 2018-11-12 ENCOUNTER — Encounter (HOSPITAL_COMMUNITY): Payer: Self-pay | Admitting: Emergency Medicine

## 2018-11-12 ENCOUNTER — Inpatient Hospital Stay (HOSPITAL_COMMUNITY)
Admission: EM | Admit: 2018-11-12 | Discharge: 2018-11-15 | DRG: 190 | Disposition: A | Discharge status: Home / Self Care | Payer: Medicare Other | Attending: Internal Medicine | Admitting: Internal Medicine

## 2018-11-12 DIAGNOSIS — N2581 Secondary hyperparathyroidism of renal origin: Secondary | ICD-10-CM | POA: Diagnosis present

## 2018-11-12 DIAGNOSIS — I4510 Unspecified right bundle-branch block: Secondary | ICD-10-CM | POA: Diagnosis present

## 2018-11-12 DIAGNOSIS — I4891 Unspecified atrial fibrillation: Secondary | ICD-10-CM | POA: Diagnosis present

## 2018-11-12 DIAGNOSIS — Z7901 Long term (current) use of anticoagulants: Secondary | ICD-10-CM | POA: Diagnosis not present

## 2018-11-12 DIAGNOSIS — E8770 Fluid overload, unspecified: Secondary | ICD-10-CM | POA: Diagnosis present

## 2018-11-12 DIAGNOSIS — I2510 Atherosclerotic heart disease of native coronary artery without angina pectoris: Secondary | ICD-10-CM | POA: Diagnosis present

## 2018-11-12 DIAGNOSIS — Z87891 Personal history of nicotine dependence: Secondary | ICD-10-CM

## 2018-11-12 DIAGNOSIS — J441 Chronic obstructive pulmonary disease with (acute) exacerbation: Secondary | ICD-10-CM | POA: Diagnosis present

## 2018-11-12 DIAGNOSIS — D696 Thrombocytopenia, unspecified: Secondary | ICD-10-CM | POA: Diagnosis present

## 2018-11-12 DIAGNOSIS — Z79899 Other long term (current) drug therapy: Secondary | ICD-10-CM | POA: Diagnosis not present

## 2018-11-12 DIAGNOSIS — Z9115 Patient's noncompliance with renal dialysis: Secondary | ICD-10-CM | POA: Diagnosis not present

## 2018-11-12 DIAGNOSIS — E8889 Other specified metabolic disorders: Secondary | ICD-10-CM | POA: Diagnosis present

## 2018-11-12 DIAGNOSIS — I714 Abdominal aortic aneurysm, without rupture: Secondary | ICD-10-CM | POA: Diagnosis not present

## 2018-11-12 DIAGNOSIS — Z992 Dependence on renal dialysis: Secondary | ICD-10-CM

## 2018-11-12 DIAGNOSIS — Z7951 Long term (current) use of inhaled steroids: Secondary | ICD-10-CM | POA: Diagnosis not present

## 2018-11-12 DIAGNOSIS — E7800 Pure hypercholesterolemia, unspecified: Secondary | ICD-10-CM | POA: Diagnosis present

## 2018-11-12 DIAGNOSIS — I120 Hypertensive chronic kidney disease with stage 5 chronic kidney disease or end stage renal disease: Secondary | ICD-10-CM | POA: Diagnosis present

## 2018-11-12 DIAGNOSIS — N186 End stage renal disease: Secondary | ICD-10-CM | POA: Diagnosis present

## 2018-11-12 DIAGNOSIS — D631 Anemia in chronic kidney disease: Secondary | ICD-10-CM | POA: Diagnosis present

## 2018-11-12 DIAGNOSIS — E785 Hyperlipidemia, unspecified: Secondary | ICD-10-CM | POA: Diagnosis not present

## 2018-11-12 DIAGNOSIS — G2581 Restless legs syndrome: Secondary | ICD-10-CM | POA: Diagnosis present

## 2018-11-12 DIAGNOSIS — Z951 Presence of aortocoronary bypass graft: Secondary | ICD-10-CM | POA: Diagnosis not present

## 2018-11-12 DIAGNOSIS — I482 Chronic atrial fibrillation, unspecified: Secondary | ICD-10-CM | POA: Diagnosis not present

## 2018-11-12 HISTORY — DX: Atherosclerotic heart disease of native coronary artery without angina pectoris: I25.10

## 2018-11-12 LAB — COMPREHENSIVE METABOLIC PANEL
ALT: 60 U/L — AB (ref 0–44)
AST: 50 U/L — ABNORMAL HIGH (ref 15–41)
Albumin: 3.7 g/dL (ref 3.5–5.0)
Alkaline Phosphatase: 66 U/L (ref 38–126)
Anion gap: 16 — ABNORMAL HIGH (ref 5–15)
BUN: 68 mg/dL — ABNORMAL HIGH (ref 8–23)
CO2: 19 mmol/L — ABNORMAL LOW (ref 22–32)
Calcium: 9.3 mg/dL (ref 8.9–10.3)
Chloride: 101 mmol/L (ref 98–111)
Creatinine, Ser: 8.16 mg/dL — ABNORMAL HIGH (ref 0.61–1.24)
GFR calc non Af Amer: 6 mL/min — ABNORMAL LOW (ref >60)
GFR, EST AFRICAN AMERICAN: 7 mL/min — AB (ref >60)
Glucose, Bld: 121 mg/dL — ABNORMAL HIGH (ref 70–99)
Potassium: 4.4 mmol/L (ref 3.5–5.1)
Sodium: 136 mmol/L (ref 135–145)
Total Bilirubin: 0.8 mg/dL (ref 0.3–1.2)
Total Protein: 7.3 g/dL (ref 6.5–8.1)

## 2018-11-12 LAB — CBC WITH DIFFERENTIAL/PLATELET
Abs Immature Granulocytes: 0.07 10*3/uL (ref 0.00–0.07)
Basophils Absolute: 0.1 10*3/uL (ref 0.0–0.1)
Basophils Relative: 1 %
Eosinophils Absolute: 0.3 10*3/uL (ref 0.0–0.5)
Eosinophils Relative: 3 %
HEMATOCRIT: 36.9 % — AB (ref 39.0–52.0)
Hemoglobin: 10.9 g/dL — ABNORMAL LOW (ref 13.0–17.0)
Immature Granulocytes: 1 %
Lymphocytes Relative: 9 %
Lymphs Abs: 0.9 10*3/uL (ref 0.7–4.0)
MCH: 27.8 pg (ref 26.0–34.0)
MCHC: 29.5 g/dL — ABNORMAL LOW (ref 30.0–36.0)
MCV: 94.1 fL (ref 80.0–100.0)
Monocytes Absolute: 0.7 10*3/uL (ref 0.1–1.0)
Monocytes Relative: 7 %
Neutro Abs: 7.9 10*3/uL — ABNORMAL HIGH (ref 1.7–7.7)
Neutrophils Relative %: 79 %
Platelets: 30 10*3/uL — ABNORMAL LOW (ref 150–400)
RBC: 3.92 MIL/uL — ABNORMAL LOW (ref 4.22–5.81)
RDW: 16.8 % — ABNORMAL HIGH (ref 11.5–15.5)
WBC: 9.9 10*3/uL (ref 4.0–10.5)
nRBC: 0.2 % (ref 0.0–0.2)

## 2018-11-12 LAB — POCT I-STAT EG7
Acid-base deficit: 6 mmol/L — ABNORMAL HIGH (ref 0.0–2.0)
Acid-base deficit: 5 mmol/L — ABNORMAL HIGH (ref 0.0–2.0)
Bicarbonate: 20.6 mmol/L (ref 20.0–28.0)
Bicarbonate: 18.7 mmol/L — ABNORMAL LOW (ref 20.0–28.0)
Calcium, Ion: 1.21 mmol/L (ref 1.15–1.40)
Calcium, Ion: 1.21 mmol/L (ref 1.15–1.40)
HCT: 36 % — ABNORMAL LOW (ref 39.0–52.0)
HCT: 34 % — ABNORMAL LOW (ref 39.0–52.0)
Hemoglobin: 12.2 g/dL — ABNORMAL LOW (ref 13.0–17.0)
Hemoglobin: 11.6 g/dL — ABNORMAL LOW (ref 13.0–17.0)
O2 Saturation: 93 %
O2 Saturation: 94 %
Potassium: 4.5 mmol/L (ref 3.5–5.1)
Potassium: 4.4 mmol/L (ref 3.5–5.1)
Sodium: 137 mmol/L (ref 135–145)
Sodium: 136 mmol/L (ref 135–145)
TCO2: 22 mmol/L (ref 22–32)
TCO2: 20 mmol/L — ABNORMAL LOW (ref 22–32)
pCO2, Ven: 42.5 mmHg — ABNORMAL LOW (ref 44.0–60.0)
pCO2, Ven: 31.1 mmHg — ABNORMAL LOW (ref 44.0–60.0)
pH, Ven: 7.293 (ref 7.250–7.430)
pH, Ven: 7.387 (ref 7.250–7.430)
pO2, Ven: 75 mmHg — ABNORMAL HIGH (ref 32.0–45.0)
pO2, Ven: 71 mmHg — ABNORMAL HIGH (ref 32.0–45.0)

## 2018-11-12 LAB — TROPONIN I: Troponin I: 0.11 ng/mL (ref <0.03)

## 2018-11-12 MED ORDER — SODIUM CHLORIDE 0.9 % IV SOLN: 100.0000 mL | INTRAVENOUS | Status: DC | PRN

## 2018-11-12 MED ORDER — BENZONATATE 100 MG PO CAPS
100.0000 mg | ORAL_CAPSULE | Freq: Three times a day (TID) | ORAL | Status: DC
  Administered 2018-11-12 – 2018-11-15 (×8): 100 mg via ORAL
  Filled 2018-11-12 (×8): qty 1

## 2018-11-12 MED ORDER — LIDOCAINE HCL (PF) 1 % IJ SOLN: 5.0000 mL | INTRAMUSCULAR | Status: DC | PRN

## 2018-11-12 MED ORDER — LIDOCAINE-PRILOCAINE 2.5-2.5 % EX CREA
1.0000 "application " | TOPICAL_CREAM | CUTANEOUS | Status: DC | PRN
  Filled 2018-11-12: qty 5

## 2018-11-12 MED ORDER — HEPARIN SODIUM (PORCINE) 1000 UNIT/ML DIALYSIS
1000.0000 [IU] | INTRAMUSCULAR | Status: DC | PRN
  Filled 2018-11-12: qty 1

## 2018-11-12 MED ORDER — HEPARIN SODIUM (PORCINE) 1000 UNIT/ML IJ SOLN
3800.0000 [IU] | Freq: Once | INTRAMUSCULAR | Status: AC
  Administered 2018-11-12: 3800 [IU]

## 2018-11-12 MED ORDER — METHYLPREDNISOLONE SODIUM SUCC 125 MG IJ SOLR
125.0000 mg | Freq: Once | INTRAMUSCULAR | Status: AC
  Administered 2018-11-12: 125 mg via INTRAVENOUS
  Filled 2018-11-12: qty 2

## 2018-11-12 MED ORDER — IPRATROPIUM-ALBUTEROL 0.5-2.5 (3) MG/3ML IN SOLN
3.0000 mL | Freq: Once | RESPIRATORY_TRACT | Status: AC
  Administered 2018-11-12: 3 mL via RESPIRATORY_TRACT
  Filled 2018-11-12: qty 3

## 2018-11-12 MED ORDER — ALBUTEROL SULFATE (2.5 MG/3ML) 0.083% IN NEBU
5.0000 mg | INHALATION_SOLUTION | Freq: Once | RESPIRATORY_TRACT | Status: AC
  Administered 2018-11-12: 5 mg via RESPIRATORY_TRACT
  Filled 2018-11-12: qty 6

## 2018-11-12 MED ORDER — DIPHENHYDRAMINE HCL 25 MG PO CAPS
25.0000 mg | ORAL_CAPSULE | Freq: Once | ORAL | Status: AC
  Administered 2018-11-12: 25 mg via ORAL
  Filled 2018-11-12: qty 1

## 2018-11-12 MED ORDER — ACETAMINOPHEN 325 MG PO TABS
650.0000 mg | ORAL_TABLET | Freq: Four times a day (QID) | ORAL | Status: DC | PRN
  Administered 2018-11-13 – 2018-11-15 (×4): 650 mg via ORAL
  Filled 2018-11-12 (×4): qty 2

## 2018-11-12 MED ORDER — HEPARIN SODIUM (PORCINE) 1000 UNIT/ML IJ SOLN
INTRAMUSCULAR | Status: AC
  Filled 2018-11-12: qty 4

## 2018-11-12 MED ORDER — PENTAFLUOROPROP-TETRAFLUOROETH EX AERO
1.0000 "application " | INHALATION_SPRAY | CUTANEOUS | Status: DC | PRN
  Filled 2018-11-12: qty 30

## 2018-11-12 MED ORDER — ALTEPLASE 2 MG IJ SOLR: 2.0000 mg | Freq: Once | INTRAMUSCULAR | Status: DC | PRN

## 2018-11-12 MED ORDER — SEVELAMER CARBONATE 800 MG PO TABS
1600.0000 mg | ORAL_TABLET | Freq: Three times a day (TID) | ORAL | Status: DC
  Administered 2018-11-13 – 2018-11-15 (×6): 1600 mg via ORAL
  Filled 2018-11-12 (×6): qty 2

## 2018-11-12 MED ORDER — PREDNISONE 20 MG PO TABS
40.0000 mg | ORAL_TABLET | Freq: Every day | ORAL | Status: DC
  Administered 2018-11-13 – 2018-11-15 (×3): 40 mg via ORAL
  Filled 2018-11-12 (×3): qty 2

## 2018-11-12 MED ORDER — DILTIAZEM HCL-DEXTROSE 100-5 MG/100ML-% IV SOLN (PREMIX)
5.0000 mg/h | INTRAVENOUS | Status: AC
  Administered 2018-11-12: 5 mg/h via INTRAVENOUS
  Filled 2018-11-12 (×2): qty 100

## 2018-11-12 MED ORDER — HEPARIN SODIUM (PORCINE) 5000 UNIT/ML IJ SOLN: 5000.0000 [IU] | Freq: Three times a day (TID) | INTRAMUSCULAR | Status: DC

## 2018-11-12 MED ORDER — ACETAMINOPHEN 650 MG RE SUPP: 650.0000 mg | Freq: Four times a day (QID) | RECTAL | Status: DC | PRN

## 2018-11-12 MED ORDER — CHLORHEXIDINE GLUCONATE CLOTH 2 % EX PADS
6.0000 | MEDICATED_PAD | Freq: Every day | CUTANEOUS | Status: DC
  Administered 2018-11-14: 6 via TOPICAL

## 2018-11-12 MED ORDER — BUDESONIDE 0.25 MG/2ML IN SUSP
0.2500 mg | Freq: Two times a day (BID) | RESPIRATORY_TRACT | Status: DC
  Administered 2018-11-13: 0.25 mg via RESPIRATORY_TRACT
  Filled 2018-11-12 (×2): qty 2

## 2018-11-12 NOTE — ED Triage Notes (Signed)
Pt reports sob with any activity pt is a dialysis patient and last full treatment was Friday-On Monday pt states 'there was a problem with my cathter and they had to put medication in to try and open it up" pt states he did not have a complete treatment and since has had progressive sob. Pt arrives with labored breathing but able to speak in complete sentences. Pt has hx of CABG x4. Pt denies any cp.

## 2018-11-12 NOTE — H&P (Signed)
Date: 11/12/2018               Patient Name:  Larry Klein MRN: [id number]  DOB: 26-Jan-1956 Age / Sex: 63 y.o., male   PCP: Patient, No Pcp Per         Medical Service: Internal Medicine Teaching Service         Attending Physician: Dr. Aldine Contes, MD    First Contact: Dr. Sherry Ruffing Pager: [phone]  Winfrey Dr. Shan Levans Pager: [phone]       After Hours (After 5p/  First Contact Pager: [phone]  weekends / holidays): Second Contact Pager: [phone]   Chief Complaint: Shortness of breath  History of Present Illness: This is a 63 year old male with a history of ESRD (hemodialysis Monday Wednesday Friday), atrial fibrillation(not on any coagulation or rate controlling medications), chronic thrombocytopenia, CAD status post CABG in 2017, AAA status post repair, hypertension, hyperlipidemia, COPD who presented with a one-week history of worsening shortness of breath.  He reported that he went to dialysis on Monday and was having difficulty tolerating it due to shortness of breath and he also has some clogging of his catheter. On Wednesday he did not go to dialysis due to his shortness of breath.  He also endorses a productive cough with yellow/clear sputum, palpitations, headaches, and generally feeling unwell.  For the last few days he has increased the use of his inhalers and nebulizers.  Denies any fevers, chills, nausea, vomiting, chest pain, lightheadedness, dizziness, or other symptoms.  He denies any recent sick contacts or recent travel.  He was found to be tachycardic up to 155, typically does not get this.  He does state that when he is in dialysis sometimes his heart rate will go down significantly, he seen it in the 40-50s range.  He missed his dialysis on Wednesday, he reports that he does not think he has a lot of extra fluid, he has dry weight 96.  And normally is able to complete his whole dialysis session and does not miss his sessions.  He was recently admitted from 1/2 to  1/8 for ESRD,he had recently moved from [place] to [place] and had not set up dialysis, he had a AV fistula placed at that time and was set up with [facility]. 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   In the ED he was found to be afebrile, tachypneic up to 27, hypertensive up to 158/101, and tachycardic up to 155.  He had a VBG that showed a pH 7.38, PCO2 31, PO2 71.  CMP showed bicarb 19, BUN 68, creatinine 8.16(baseline around 6), AST 50, ALT 60.  EKG showed atrial fibrillation, rate around 132, with a right bundle branch block, no ST changes.  Chest x-ray showed sternotomy wires, mild bibasilar opacities, favor atelectasis of airspace disease/pneumonia, cardiomegaly and small right pleural effusion.  He was given Solu-Medrol, duo nebs, and started on a diltiazem drip.  Nephrology was consulted.  Patient was admitted to internal medicine.  Meds:  Current Meds  Medication Sig  . acetaminophen (TYLENOL) 500 MG tablet Take 1,000 mg by mouth as needed for mild pain or headache.  . albuterol (PROVENTIL HFA;VENTOLIN HFA) 108 (90 Base) MCG/ACT inhaler Inhale 2 puffs into the lungs every  6 (six) hours as needed for wheezing.  . calcium carbonate (TUMS - DOSED IN MG ELEMENTAL CALCIUM) 500 MG chewable tablet Chew 2 tablets by mouth 3 (three) times daily before meals.  Marland Kitchen ipratropium-albuterol (DUONEB) 0.5-2.5 (3) MG/3ML SOLN Take 3 mLs by nebulization every 6 (six) hours as needed (wheezing).     Allergies: Allergies as of 11/12/2018  . (No Known Allergies)   Past Medical History:  Diagnosis Date  . COPD (chronic obstructive pulmonary disease) ([place])   . Coronary artery disease   . High cholesterol   . Hypertension   . Renal disorder     Family  History: Son has hypertension.  No significant family history.  Social History: Denies any current alcohol, smoking, or drug use.  Former smoker, quit in 2017.  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 Moved to [place] from [place].  Review of Systems: A complete ROS was negative except as per HPI.   Physical Exam: Blood pressure (!) 132/107, pulse (!) 107, temperature (!) 97.5 F (36.4 C), temperature source Oral, resp. rate (!) 21, SpO2 100 %. Physical Exam  Constitutional: He is oriented to person, place, and time.  Elderly male, NAD, sitting up in bed  HENT:  Head: Normocephalic and atraumatic.  Eyes: Pupils are equal, round, and reactive to light. Conjunctivae and EOM are normal.  Neck: Normal range of motion. Neck supple. No thyromegaly present.  Cardiovascular: Regular rhythm.  Tachycardic, irregular, no appreciable murmurs  Pulmonary/Chest: Effort normal. No respiratory distress.  Diffuse wheezing throughout   Abdominal: Soft. Bowel sounds are normal.  Obese, non-tender  Musculoskeletal: Normal range of motion.        General: No edema.  Neurological: He is alert and oriented to person, place, and time.  Skin: Skin is warm and dry. No erythema.  Psychiatric: Mood and affect normal.    EKG: personally reviewed my interpretation is atrial fibrillation, rate about 132, RBBB, no ST changes  CXR: personally reviewed my interpretation is sternotomy wires in place, left subclavian line in place, cardiomegaly,   Assessment & Plan by Problem: Active Problems:   Atrial fibrillation [facility])  This is a 63 year old male with a history of ESRD (hemodialysis Monday Wednesday Friday), atrial fibrillation(not on any coagulation or rate controlling medications), chronic thrombocytopenia, CAD status post CABG in 2017, AAA status post repair, hypertension, hyperlipidemia, COPD who presented with a one-week history of worsening shortness of breath. He was found to be  tachypneic, hypertensive and tachycardic. Labs significant for normal CBC and BMP.   Atrial fibrillation with RVR: -He is not on any anticoagulation due to his chronic thrombocytopenia or rate controlling medications at home. He reported that he sometimes will have palpitations when his heart rate goes up. He denied any chest pain or symptoms of infection. His EKG showed atrial fibrillation, HR up to 130s. Troponin was at 0.11. He has been having worsening shortness of breath and had been using his inhalers and nebulizer's more frequently over the past few days which may have exacerbated his HR.  -Continue diltiazem drip for rate controlled -Telemetry  COPD: Worsening dyspnea: Could be related to some fluid overload after missing dialysis and current a fib with RVR vs a mild COPD exacerbation given his increase in inhaler use and minimal diffuse wheezing on exam. He was afebrile, no leukocytosis, and he denied any fevers, chills, nausea, vomiting, or other symptoms, CXR showed some mild bibasilar opacities which favored atelectasis over airspace disease/pneumonia  so an infectious cause seems less likely.  -Prednisone x4 days -Pulmicort nebs PRN -Hold duonebs for now due to his a fib -Supplemental oxygen as needed  ESRD on HD MWF: -Nephrology following, appreciate recommendation -No need for emergent HD at this time -HD per nephro -Daily BMP  Thrombocytopenia: -Patient has a history of thrombocytopenia, this was noted on his admission in January and he had testing for heparin associated thrombocytopenia at that time which was negative.  He apparently has had been having issues with his blood counts since he was a teenager, denies any current bleeding.  Platelets today for 30.  We will continue to monitor this for now. -Daily CBC -Monitor for bleeding  CAD s/p CABG: -CABG in 2017, not on any medications at home.  FEN: No fluids, replete lytes prn, renal diet  VTE ppx: SCDs Code Status:  FULL    Dispo: Admit patient to Inpatient with expected length of stay greater than 2 midnights.  Signed: Asencion Noble, MD 11/12/2018, 2:02 PM  Pager: [phone]

## 2018-11-12 NOTE — Consult Note (Signed)
[facility] Renal Consultation Note    Indication for Consultation:  Management of ESRD/hemodialysis, anemia, hypertension/volume, and secondary hyperparathyroidism. PCP:  HPI: Larry Klein is a 63 y.o. male with a history of ESRD, COPD, atrial fibrillation (not on anticoagulation due to thrombocytopenia), CAD, and hypertension who presented to the ED today with shortness of breath. Patient reports he has become progressively more short of breath for the past 3 days. He used his nebulizer without improvement. He reports wheezing, non-productive cough, and occasional chest tightness. Denies recent URI symptoms, fevers, chills, abdominal pain, N/V, diarrhea, and peripheral edema. Per outpatient dialysis notes, was prescribed Z pak for cough on 11/09/2018 but not taking. On presentation to the ED, patient was tachycardic with HR in 120s-130s. BP elevated, afebrile, O2 sat 96-99% on room air. Labs revealed K 4.4, CO2 19, Hgb 10.9 and WBC 9.9. EKG was consistent with a.fib and RBBB. CXR showed mild bibasilar opacities favoring atelectasis, cardiomegaly, and unchanged small R pleural effusion.  He was started on a Cardizem drip in the ED.  Reports he missed dialysis yesterday due to SOB. His last treatment was 11/09/2018. He reports treatment was cut short due to issues with catheter clotting treated with Alteplase, but did get close to his EDW at 96.6kg. Symptoms have not improved since arrival. He does not feel his is volume overloaded. Has a maturing AVF in left forearm placed 09/18/2018 by Dr. Donnetta Hutching.  Past Medical History:  Diagnosis Date  . COPD (chronic obstructive pulmonary disease) ([place])   . Coronary artery disease   . High cholesterol   . Hypertension   . Renal disorder    Past Surgical History:  Procedure Laterality Date  . CARDIAC SURGERY    . THROMBECTOMY W/ EMBOLECTOMY Left 09/18/2018   Procedure: ARTERIOVENOUS FISTULA LEFT ARM;  Surgeon: Rosetta Posner, MD;  Location: [place];   Service: Vascular;  Laterality: Left;   No family history on file. Social History:  reports that he quit smoking about 3 years ago. He has never used smokeless tobacco. He reports previous alcohol use. He reports that he does not use drugs.  ROS: As per HPI otherwise negative.  Physical Exam: Vitals:   11/12/18 1215 11/12/18 1230 11/12/18 1245 11/12/18 1300  BP: (!) 132/119 (!) 134/107 (!) 139/113   Pulse: 69 63 (!) 147 (!) 105  Resp: (!) 23 (!) 21 17 20   Temp:      TempSrc:      SpO2: 93% 98% 97% 98%     General: Well developed, well nourished, appears uncomfortable but in NAD Head: Normocephalic, atraumatic, sclera non-icteric, mucus membranes are moist. Neck: Supple without lymphadenopathy/masses. JVD not elevated. Lungs: Mildly tachypneic. No accessory muscle use. Able to speak in full sentences. Diffuse expiratory wheezing bilaterally bases. No rales or rhonchi auscultated.  Heart: Irreg irreg, tachycardic, with normal S1, S2. No murmurs, rubs, or gallops appreciated. Abdomen: Soft, non-tender, non-distended with normoactive bowel sounds. No rebound/guarding. No obvious abdominal masses. Musculoskeletal:  Strength and tone appear normal for age.  Lower extremities: L ankle edema from prior injury. No pitting edema bilaterally.  Neuro: Alert and oriented X 3. Moves all extremities spontaneously. Psych:  Responds to questions appropriately with a normal affect. Dialysis Access: L TDC, no erythema or drainage. Maturing L forearm AVF, +thrill  No Known Allergies Prior to Admission medications   Medication Sig Start Date End Date Taking? Authorizing Provider  acetaminophen (TYLENOL) 500 MG tablet Take 1,000 mg by mouth as needed for mild  pain or headache.   Yes [provider]  albuterol (PROVENTIL HFA;VENTOLIN HFA) 108 (90 Base) MCG/ACT inhaler Inhale 2 puffs into the lungs every 6 (six) hours as needed for wheezing.   Yes [provider]  calcium carbonate (TUMS  - DOSED IN MG ELEMENTAL CALCIUM) 500 MG chewable tablet Chew 2 tablets by mouth 3 (three) times daily before meals.   Yes [provider]  ipratropium-albuterol (DUONEB) 0.5-2.5 (3) MG/3ML SOLN Take 3 mLs by nebulization every 6 (six) hours as needed (wheezing).   Yes [provider]   Current Facility-Administered Medications  Medication Dose Route Frequency Provider Last Rate Last Dose  . [START ON 11/13/2018] Chlorhexidine Gluconate Cloth 2 % PADS 6 each  6 each Topical Q0600 Maurie Musco, Hervey Ard, PA-C      . diltiazem (CARDIZEM) 100 mg in dextrose 5% 138mL (1 mg/mL) infusion  5-15 mg/hr Intravenous Continuous Quintella Reichert, MD 10 mL/hr at 11/12/18 1321 10 mg/hr at 11/12/18 1321   Current Outpatient Medications  Medication Sig Dispense Refill  . acetaminophen (TYLENOL) 500 MG tablet Take 1,000 mg by mouth as needed for mild pain or headache.    . albuterol (PROVENTIL HFA;VENTOLIN HFA) 108 (90 Base) MCG/ACT inhaler Inhale 2 puffs into the lungs every 6 (six) hours as needed for wheezing.    . calcium carbonate (TUMS - DOSED IN MG ELEMENTAL CALCIUM) 500 MG chewable tablet Chew 2 tablets by mouth 3 (three) times daily before meals.    Marland Kitchen ipratropium-albuterol (DUONEB) 0.5-2.5 (3) MG/3ML SOLN Take 3 mLs by nebulization every 6 (six) hours as needed (wheezing).     Labs: Basic Metabolic Panel: Recent Labs  Lab 11/12/18 0957 11/12/18 1002 11/12/18 1031  NA 136 137 136  K 4.4 4.5 4.4  CL 101  --   --   CO2 19*  --   --   GLUCOSE 121*  --   --   BUN 68*  --   --   CREATININE 8.16*  --   --   CALCIUM 9.3  --   --    Liver Function Tests: Recent Labs  Lab 11/12/18 0957  AST 50*  ALT 60*  ALKPHOS 66  BILITOT 0.8  PROT 7.3  ALBUMIN 3.7   CBC: Recent Labs  Lab 11/12/18 0957 11/12/18 1002 11/12/18 1031  WBC 9.9  --   --   NEUTROABS 7.9*  --   --   HGB 10.9* 12.2* 11.6*  HCT 36.9* 36.0* 34.0*  MCV 94.1  --   --   PLT 30*  --   --    Cardiac Enzymes: Recent  Labs  Lab 11/12/18 0957  TROPONINI 0.11*   Studies/Results: Dg Chest Port 1 View  Result Date: 11/12/2018 CLINICAL DATA:  Acute shortness of breath. EXAM: PORTABLE CHEST 1 VIEW COMPARISON:  09/17/2018 and prior chest radiographs FINDINGS: Cardiomegaly, CABG changes and LEFT IJ central venous catheter with tips overlying the mid and LOWER SVC noted. Mild bibasilar opacities/atelectasis noted. A small RIGHT pleural effusion is unchanged. No pneumothorax or acute bony abnormality. IMPRESSION: Mild bibasilar opacities/slightly favor atelectasis over airspace disease/pneumonia. Cardiomegaly and unchanged small RIGHT pleural effusion. Electronically Signed   By: Margarette Canada M.D.   On: 11/12/2018 10:41    Dialysis Orders: Center: [facility]  on MWF. Time 4 hr, 180NRe Optiflux, BFR 400, DFR 800 EDW 96kg; 3K, 2.25Ca Heparin: None (thrombocytopenia) Venofer 50 mg IV q week Mircera 50 mcg IVP q2 weeks (last dose: Calcitriol 0.39mcg PO  3x week during dialysis  Assessment/Plan: 1.  Shortness of breath: Likely multifactorial due to a.fib RVR, COPD and volume overload. HR currently 130-150 on cardizem drip. Will plan for dialysis today once heart rate is adequately controlled. Further management per primary.  2. Atrial fibrillation with RVR: Likely contributing to SOB. Currently on cardizem drip. No anticoagulation due to history of thrombocytopenia. Management per primary. 3. COPD: Management per primary. 4.  ESRD:  Normally MWF, missed last HD. Will plan for dialysis today once heart rate is adequately controlled. 5.  Hypertension/volume: Blood pressure elevated, expect improvement with HD. Does not appear acutely volume overloaded on exam. Will plan for HD with UF goal of 3L once HR is adequately controlled, otherwise tachycardia is likely to limit UF.  6.  Anemia: Hgb 11.6. At goal, no ESA at this time.  7.  Metabolic bone disease: Ca 9.3, corrected 9.5. Last outpatient phos 4., continue  TUMs as binder. Continue calcitriol. 8.  Nutrition: Albumin 3.7.   9. Thrombocytopenia: Hold heparin  Anice Paganini, PA-C 11/12/2018, 1:22 PM  [facility] Pager: [phone]

## 2018-11-12 NOTE — ED Provider Notes (Addendum)
[facility] EMERGENCY DEPARTMENT Provider Note   CSN: [id number] Arrival date & time: 11/12/18  6160    History   Chief Complaint Chief Complaint  Patient presents with  . Shortness of Breath    HPI Larry Klein is a 63 y.o. male.     The history is provided by the patient and medical records. No language interpreter was used.  Shortness of Breath   Larry Klein is a 63 y.o. male who presents to the Emergency Department complaining of sob. Three of COPD, ESR D on hemodialysis, coronary artery disease and presents for evaluation of shortness of breath. He has experienced increased shortness of breath and wheezing over the last several weeks, severe over the last three days. He dialyzed is in [facility] Monday, Wednesday, Friday. He had a normal full session on Friday. On Sunday he developed increased shortness of breath. On Monday he went to dialysis but they had difficulty drawing from his vast cath due to clotting but they were able to get him to his dry weight of 96.3 kg. He Ms. dialysis on Wednesday due to ongoing dyspnea. He has been attempting to use his nebulizer at home with no significant improvement in his symptoms. He has shortness of breath at rest with a nonproductive cough. He does have occasional chest tightness, none currently. He denies any fevers, vomiting, abdominal pain, leg swelling or pain. Symptoms are severe, worsening. He has a history of atrial fibrillation but is not on current medications for it and is not on anticoagulants due to thrombocytopenia. Past Medical History:  Diagnosis Date  . COPD (chronic obstructive pulmonary disease) ([place])   . Coronary artery disease   . High cholesterol   . Hypertension   . Renal disorder     Patient Active Problem List   Diagnosis Date Noted  . Atrial fibrillation ([place]) 11/12/2018  . Thrombocytopenia ([place]) 09/20/2018  . ESRD (end stage renal disease) on dialysis ([place])   . Hepatomegaly   . Shortness  of breath 09/17/2018    Past Surgical History:  Procedure Laterality Date  . CARDIAC SURGERY    . THROMBECTOMY W/ EMBOLECTOMY Left 09/18/2018   Procedure: ARTERIOVENOUS FISTULA LEFT ARM;  Surgeon: Rosetta Posner, MD;  Location: [facility] OR;  Service: Vascular;  Laterality: Left;        Home Medications    Prior to Admission medications   Medication Sig Start Date End Date Taking? Authorizing Provider  acetaminophen (TYLENOL) 500 MG tablet Take 1,000 mg by mouth as needed for mild pain or headache.   Yes [provider]  albuterol (PROVENTIL HFA;VENTOLIN HFA) 108 (90 Base) MCG/ACT inhaler Inhale 2 puffs into the lungs every 6 (six) hours as needed for wheezing.   Yes [provider]  calcium carbonate (TUMS - DOSED IN MG ELEMENTAL CALCIUM) 500 MG chewable tablet Chew 2 tablets by mouth 3 (three) times daily before meals.   Yes [provider]  ipratropium-albuterol (DUONEB) 0.5-2.5 (3) MG/3ML SOLN Take 3 mLs by nebulization every 6 (six) hours as needed (wheezing).   Yes [provider]    Family History No family history on file.  Social History Social History   Tobacco Use  . Smoking status: Former Smoker    Last attempt to quit: 09/18/2015    Years since quitting: 3.1  . Smokeless tobacco: Never Used  Substance Use Topics  . Alcohol use: Not Currently  . Drug use: Never     Allergies   Patient has  no known allergies.   Review of Systems Review of Systems  Respiratory: Positive for shortness of breath.   All other systems reviewed and are negative.    Physical Exam Updated Vital Signs BP 135/73   Pulse 86   Temp 98.6 F (37 C) (Oral)   Resp 19   Wt 97.3 kg   SpO2 100%   BMI 31.68 kg/m   Physical Exam Vitals signs and nursing note reviewed.  Constitutional:      Appearance: He is well-developed.  HENT:     Head: Normocephalic and atraumatic.  Cardiovascular:     Heart sounds: No murmur.     Comments: Tachycardic and  irregular Pulmonary:     Comments: Decreased air movement in bilateral lung fields with occasional and expiratory wheezes. Tachypnea.  Vas Cath in left upper chest with dressing in place, C/D/I Abdominal:     Palpations: Abdomen is soft.     Tenderness: There is no abdominal tenderness. There is no guarding or rebound.  Musculoskeletal:        General: No tenderness.  Skin:    General: Skin is warm and dry.  Neurological:     Mental Status: He is alert and oriented to person, place, and time.  Psychiatric:        Behavior: Behavior normal.      ED Treatments / Results  Labs (all labs ordered are listed, but only abnormal results are displayed) Labs Reviewed  COMPREHENSIVE METABOLIC PANEL - Abnormal; Notable for the following components:      Result Value   CO2 19 (*)    Glucose, Bld 121 (*)    BUN 68 (*)    Creatinine, Ser 8.16 (*)    AST 50 (*)    ALT 60 (*)    GFR calc non Af Amer 6 (*)    GFR calc Af Amer 7 (*)    Anion gap 16 (*)    All other components within normal limits  CBC WITH DIFFERENTIAL/PLATELET - Abnormal; Notable for the following components:   RBC 3.92 (*)    Hemoglobin 10.9 (*)    HCT 36.9 (*)    MCHC 29.5 (*)    RDW 16.8 (*)    Platelets 30 (*)    Neutro Abs 7.9 (*)    All other components within normal limits  TROPONIN I - Abnormal; Notable for the following components:   Troponin I 0.11 (*)    All other components within normal limits  POCT I-STAT EG7 - Abnormal; Notable for the following components:   pCO2, Ven 42.5 (*)    pO2, Ven 75.0 (*)    Acid-base deficit 6.0 (*)    HCT 36.0 (*)    Hemoglobin 12.2 (*)    All other components within normal limits  POCT I-STAT EG7 - Abnormal; Notable for the following components:   pCO2, Ven 31.1 (*)    pO2, Ven 71.0 (*)    Bicarbonate 18.7 (*)    TCO2 20 (*)    Acid-base deficit 5.0 (*)    HCT 34.0 (*)    Hemoglobin 11.6 (*)    All other components within normal limits  RAPID URINE DRUG SCREEN,  HOSP PERFORMED  I-STAT VENOUS BLOOD GAS, ED    EKG EKG Interpretation  Date/Time:  Thursday November 12 2018 09:37:33 EST Ventricular Rate:  137 PR Interval:    QRS Duration: 137 QT Interval:  347 QTC Calculation: 524 R Axis:   -120 Text Interpretation:  Atrial  fibrillation Right bundle branch block Confirmed by Quintella Reichert [phone]) on 11/12/2018 9:45:38 AM   Radiology Dg Chest Port 1 View  Result Date: 11/12/2018 CLINICAL DATA:  Acute shortness of breath. EXAM: PORTABLE CHEST 1 VIEW COMPARISON:  09/17/2018 and prior chest radiographs FINDINGS: Cardiomegaly, CABG changes and LEFT IJ central venous catheter with tips overlying the mid and LOWER SVC noted. Mild bibasilar opacities/atelectasis noted. A small RIGHT pleural effusion is unchanged. No pneumothorax or acute bony abnormality. IMPRESSION: Mild bibasilar opacities/slightly favor atelectasis over airspace disease/pneumonia. Cardiomegaly and unchanged small RIGHT pleural effusion. Electronically Signed   By: Margarette Canada M.D.   On: 11/12/2018 10:41    Procedures Procedures (including critical care time) CRITICAL CARE Performed by: Quintella Reichert   Total critical care time: 35 minutes  Critical care time was exclusive of separately billable procedures and treating other patients.  Critical care was necessary to treat or prevent imminent or life-threatening deterioration.  Critical care was time spent personally by me on the following activities: development of treatment plan with patient and/or surrogate as well as nursing, discussions with consultants, evaluation of patient's response to treatment, examination of patient, obtaining history from patient or surrogate, ordering and performing treatments and interventions, ordering and review of laboratory studies, ordering and review of radiographic studies, pulse oximetry and re-evaluation of patient's condition.  Medications Ordered in ED Medications  diltiazem (CARDIZEM)  100 mg in dextrose 5% 114m (1 mg/mL) infusion (15 mg/hr Intravenous Rate/Dose Change 11/12/18 1352)  Chlorhexidine Gluconate Cloth 2 % PADS 6 each (has no administration in time range)  pentafluoroprop-tetrafluoroeth (GEBAUERS) aerosol 1 application (has no administration in time range)  lidocaine (PF) (XYLOCAINE) 1 % injection 5 mL (has no administration in time range)  lidocaine-prilocaine (EMLA) cream 1 application (has no administration in time range)  0.9 %  sodium chloride infusion (has no administration in time range)  0.9 %  sodium chloride infusion (has no administration in time range)  heparin injection 1,000 Units (has no administration in time range)  alteplase (CATHFLO ACTIVASE) injection 2 mg (has no administration in time range)  acetaminophen (TYLENOL) tablet 650 mg (has no administration in time range)    Or  acetaminophen (TYLENOL) suppository 650 mg (has no administration in time range)  budesonide (PULMICORT) nebulizer solution 0.25 mg (has no administration in time range)  predniSONE (DELTASONE) tablet 40 mg (has no administration in time range)  sevelamer carbonate (RENVELA) tablet 1,600 mg (has no administration in time range)  ipratropium-albuterol (DUONEB) 0.5-2.5 (3) MG/3ML nebulizer solution 3 mL (3 mLs Nebulization Given 11/12/18 1022)  methylPREDNISolone sodium succinate (SOLU-MEDROL) 125 mg/2 mL injection 125 mg (125 mg Intravenous Given 11/12/18 1040)  albuterol (PROVENTIL) (2.5 MG/3ML) 0.083% nebulizer solution 5 mg (5 mg Nebulization Given 11/12/18 1134)     Initial Impression / Assessment and Plan / ED Course  I have reviewed the triage vital signs and the nursing notes.  Pertinent labs & imaging results that were available during my care of the patient were reviewed by me and considered in my medical decision making (see chart for details).        Patient with history of ESR D on hemodialysis here for evaluation of shortness of breath. He is non-toxic  appearing on evaluation. He does have mild tachypnea, decreased air movement on ED presentation as well as a fib with RVR on ED. Clinical presentation is mixed for respiratory versus cardiac cause of symptoms. Lung exam  improved after albuterol treatment but heart rate increased.  He was treated with Solu-Medrol for COPD exacerbation as well as diltiazem for a fib with RVR. Troponin is mildly elevated, similar when compared to priors - patient without active chest pain. No evidence of volume overload or pneumonia. Medicine consulted for admission. Nephrology made aware that yesterday patient will be admitted.  Final Clinical Impressions(s) / ED Diagnoses   Final diagnoses:  None    ED Discharge Orders    None       Quintella Reichert, MD 11/12/18 1556    Quintella Reichert, MD 11/27/18 2333

## 2018-11-12 NOTE — ED Notes (Signed)
Admitting at bedside 

## 2018-11-12 NOTE — ED Notes (Signed)
Dr. Ralene Bathe notified about pts troponin.

## 2018-11-12 NOTE — Procedures (Signed)
Patient seen on Hemodialysis. BP 135/73   Pulse 86   Temp 98.6 F (37 C) (Oral)   Resp 19   Wt 97.3 kg   SpO2 100%   BMI 31.68 kg/m   QB 350, UF goal 1.3L Tolerating treatment without complaints at this time.   Elmarie Shiley MD [facility]. Office # [phone] Pager # [phone] 3:44 PM

## 2018-11-12 NOTE — ED Notes (Signed)
Attempted IV X 1, pt requesting IV in [facility] be removed. Agreeable to keeping IV until second line is established.

## 2018-11-13 DIAGNOSIS — I482 Chronic atrial fibrillation, unspecified: Secondary | ICD-10-CM

## 2018-11-13 DIAGNOSIS — Z9115 Patient's noncompliance with renal dialysis: Secondary | ICD-10-CM

## 2018-11-13 DIAGNOSIS — D696 Thrombocytopenia, unspecified: Secondary | ICD-10-CM

## 2018-11-13 DIAGNOSIS — J441 Chronic obstructive pulmonary disease with (acute) exacerbation: Principal | ICD-10-CM

## 2018-11-13 DIAGNOSIS — Z992 Dependence on renal dialysis: Secondary | ICD-10-CM

## 2018-11-13 DIAGNOSIS — E785 Hyperlipidemia, unspecified: Secondary | ICD-10-CM

## 2018-11-13 DIAGNOSIS — I2510 Atherosclerotic heart disease of native coronary artery without angina pectoris: Secondary | ICD-10-CM

## 2018-11-13 DIAGNOSIS — N186 End stage renal disease: Secondary | ICD-10-CM

## 2018-11-13 DIAGNOSIS — I714 Abdominal aortic aneurysm, without rupture: Secondary | ICD-10-CM

## 2018-11-13 DIAGNOSIS — Z951 Presence of aortocoronary bypass graft: Secondary | ICD-10-CM

## 2018-11-13 DIAGNOSIS — I120 Hypertensive chronic kidney disease with stage 5 chronic kidney disease or end stage renal disease: Secondary | ICD-10-CM

## 2018-11-13 LAB — CBC
HCT: 33.6 % — ABNORMAL LOW (ref 39.0–52.0)
Hemoglobin: 10.5 g/dL — ABNORMAL LOW (ref 13.0–17.0)
MCH: 28.2 pg (ref 26.0–34.0)
MCHC: 31.3 g/dL (ref 30.0–36.0)
MCV: 90.1 fL (ref 80.0–100.0)
Platelets: 32 10*3/uL — ABNORMAL LOW (ref 150–400)
RBC: 3.73 MIL/uL — AB (ref 4.22–5.81)
RDW: 16.6 % — ABNORMAL HIGH (ref 11.5–15.5)
WBC: 6.8 10*3/uL (ref 4.0–10.5)
nRBC: 0 % (ref 0.0–0.2)

## 2018-11-13 LAB — HEPATITIS B SURFACE ANTIGEN: Hepatitis B Surface Ag: NEGATIVE

## 2018-11-13 MED ORDER — DARBEPOETIN ALFA 60 MCG/0.3ML IJ SOSY
60.0000 ug | PREFILLED_SYRINGE | INTRAMUSCULAR | Status: DC
  Administered 2018-11-14: 60 ug via INTRAVENOUS
  Filled 2018-11-13: qty 0.3

## 2018-11-13 MED ORDER — DILTIAZEM HCL 60 MG PO TABS
30.0000 mg | ORAL_TABLET | Freq: Four times a day (QID) | ORAL | Status: DC
  Administered 2018-11-13 – 2018-11-14 (×5): 30 mg via ORAL
  Filled 2018-11-13 (×4): qty 1

## 2018-11-13 MED ORDER — DIPHENHYDRAMINE HCL 25 MG PO CAPS
25.0000 mg | ORAL_CAPSULE | Freq: Three times a day (TID) | ORAL | Status: DC | PRN
  Administered 2018-11-13: 25 mg via ORAL
  Filled 2018-11-13: qty 1

## 2018-11-13 MED ORDER — HYDROXYZINE HCL 25 MG PO TABS
25.0000 mg | ORAL_TABLET | Freq: Three times a day (TID) | ORAL | Status: DC | PRN
  Administered 2018-11-13 (×2): 25 mg via ORAL
  Filled 2018-11-13 (×2): qty 1

## 2018-11-13 MED ORDER — CHLORHEXIDINE GLUCONATE CLOTH 2 % EX PADS
6.0000 | MEDICATED_PAD | Freq: Every day | CUTANEOUS | Status: DC
  Administered 2018-11-15: 6 via TOPICAL

## 2018-11-13 MED ORDER — ALBUTEROL SULFATE (2.5 MG/3ML) 0.083% IN NEBU
2.5000 mg | INHALATION_SOLUTION | RESPIRATORY_TRACT | Status: DC | PRN
  Administered 2018-11-13 – 2018-11-14 (×2): 2.5 mg via RESPIRATORY_TRACT
  Filled 2018-11-13 (×2): qty 3

## 2018-11-13 MED ORDER — RENA-VITE PO TABS
1.0000 | ORAL_TABLET | Freq: Every day | ORAL | Status: DC
  Administered 2018-11-13 – 2018-11-14 (×2): 1 via ORAL
  Filled 2018-11-13 (×2): qty 1

## 2018-11-13 MED ORDER — ACETAMINOPHEN 325 MG PO TABS: 650.0000 mg | ORAL_TABLET | Freq: Four times a day (QID) | ORAL | Status: DC | PRN

## 2018-11-13 MED ORDER — UMECLIDINIUM-VILANTEROL 62.5-25 MCG/INH IN AEPB
1.0000 | INHALATION_SPRAY | Freq: Every day | RESPIRATORY_TRACT | Status: DC
  Administered 2018-11-13 – 2018-11-15 (×2): 1 via RESPIRATORY_TRACT
  Filled 2018-11-13: qty 14

## 2018-11-13 MED ORDER — DILTIAZEM HCL ER 60 MG PO CP12
120.0000 mg | ORAL_CAPSULE | Freq: Two times a day (BID) | ORAL | Status: DC
  Filled 2018-11-13: qty 2

## 2018-11-13 NOTE — Progress Notes (Signed)
Pt c/o not being happy with [facility]. Would like to go somewhere else if able and insurance allows.

## 2018-11-13 NOTE — Progress Notes (Signed)
Pt c/o itching and cramping to generalized body. Anice Paganini paged. Awaiting call back.

## 2018-11-13 NOTE — Progress Notes (Signed)
  Date: 11/13/2018  Patient name: Larry Klein  Medical record number: [id number]  Date of birth: 1956/09/15   I have seen and evaluated Encarnacion Chu and discussed their care with the Residency Team.  In brief, patient is a 63 year old male with a past medical history of ESRD on hemodialysis, chronic A. fib, chronic thrombocytopenia, CAD status post CABG, AAA status post repair, nausea, hyperlipidemia, COPD who presented to the ED with worsening shortness of breath over the last week.  Patient states that over the last week he is noted progressively worsening shortness of breath associated with her cough productive of yellowish/clear phlegm as well as palpitations, generalized malaise and headaches.  Patient states that he was not feeling well on Wednesday and skipped hemodialysis that day.  He has been using his inhalers and nebulizers more at home secondary to his shortness of breath with minimal relief.  Patient states that yesterday his shortness of breath worsened and he came to the ED for further evaluation.  No fevers or chills, no nausea or vomiting, no chest pain, no abdominal pain, no diarrhea, no focal weakness, no blurry vision, no tingling or numbness, no sick contacts.  Today patient states that his shortness of breath has improved a little but still not at baseline.  He denies any other complaints at this time.  PMHx, Fam Hx, and/or Soc Hx : As per resident admit note  Vitals:   11/13/18 0835 11/13/18 1300  BP:  113/84  Pulse: 79 81  Resp: 17 17  Temp:  97.6 F (36.4 C)  SpO2: 97% 97%   General: Awake, alert, oriented x3, NAD CVS: Irregularly irregular  Lungs: Bilateral scattered expiratory wheeze noted Abdomen: Soft, nontender, nondistended, normoactive bowel sounds Extremities: Trace bilateral lower extremity edema noted  Assessment and Plan: I have seen and evaluated the patient as outlined above. I agree with the formulated Assessment and Plan as detailed in the  residents' note, with the following changes:   1.  Shortness of breath: -Patient presented to the ED with progressively worsening shortness of breath over the last week in the setting of missed hemodialysis session on Wednesday and was found to be in A. fib with RVR.  I suspect that the patient's shortness of breath is multifactorial including fluid overload from missed hemodialysis, A. fib with RVR as well as an acute COPD exacerbation. -Heart rate is better controlled today on diltiazem 5 mg/h.  We will start the patient on oral diltiazem 30 mg every 6 today and if he tolerates this we will transition him to long-acting Cardizem 120 mg daily tomorrow -Continue with telemetry for now -Patient status post hemodialysis yesterday.  Nephrology follow-up and recommendations appreciated.  Continue with hemodialysis per nephrology -Continue with prednisone to complete a 5-day course.  -We will start LABA/LAMA inhaler on discharge. -Continue supplemental oxygen as needed -No further work-up at this time -Patient is on anticoagulation for his A. fib secondary to thrombocytopenia.  This is chronic per patient.  He will need outpatient follow-up with his PCP as well as possibly hematology for further evaluation.  Aldine Contes, MD 2/28/20202:19 PM

## 2018-11-13 NOTE — Progress Notes (Addendum)
[facility] Progress Note   Subjective:   Patient seen in room. Still c/o SOB, reports symptoms did not improve with dialysis. + dry cough/wheezing. Reports restless legs and cramping after dialysis yesterday, has been happening frequently as outpatient. Binder changed to Renvela yesterday per patient request. Denies CP/palpitations, abdominal pain, N/V, diarrhea. HR now controlled. Would like to change to TTS schedule.   Objective Vitals:   11/12/18 2028 11/13/18 0418 11/13/18 0747 11/13/18 0835  BP: 101/89 138/82 (!) 133/101   Pulse: 81 89 81 79  Resp: 17  17 17   Temp: 97.8 F (36.6 C) (!) 97.4 F (36.3 C) 97.7 F (36.5 C)   TempSrc:  Oral Oral   SpO2: 98% 98%  97%  Weight:  95.7 kg    Height:       Physical Exam General: Well developed, well nourished male sitting on bedside in NAD Plethoric Heart: Irreg irreg, normal S1/S2,Gr2/6 murmur, rubs or gallops Lungs: Respirations even and unlabored, no accessory muscle use. + wheezing Bilat. Scattered rhonchi rales RLL Abdomen: Soft, non-tender, non-distended. + BS, no rebound tenderness or guarding liver down 5 cm Extremities: L ankle deformity w/trace edema. No pitting edema bilateral LE Dialysis Access: L TDC, no erythema or drainage. Maturing L forearm AVF, + thrill  Additional Objective Labs: Basic Metabolic Panel: Recent Labs  Lab 11/12/18 0957 11/12/18 1002 11/12/18 1031 11/13/18 0416  NA 136 137 136 133*  K 4.4 4.5 4.4 4.5  CL 101  --   --  96*  CO2 19*  --   --  20*  GLUCOSE 121*  --   --  147*  BUN 68*  --   --  35*  CREATININE 8.16*  --   --  5.33*  CALCIUM 9.3  --   --  8.8*  PHOS  --   --   --  3.5   Liver Function Tests: Recent Labs  Lab 11/12/18 0957 11/13/18 0416  AST 50*  --   ALT 60*  --   ALKPHOS 66  --   BILITOT 0.8  --   PROT 7.3  --   ALBUMIN 3.7 3.8   CBC: Recent Labs  Lab 11/12/18 0957 11/12/18 1002 11/12/18 1031 11/13/18 0416  WBC 9.9  --   --  6.8  NEUTROABS  7.9*  --   --   --   HGB 10.9* 12.2* 11.6* 10.5*  HCT 36.9* 36.0* 34.0* 33.6*  MCV 94.1  --   --  90.1  PLT 30*  --   --  32*    Cardiac Enzymes: Recent Labs  Lab 11/12/18 0957  TROPONINI 0.11*   Studies/Results: Dg Chest Port 1 View  Result Date: 11/12/2018 CLINICAL DATA:  Acute shortness of breath. EXAM: PORTABLE CHEST 1 VIEW COMPARISON:  09/17/2018 and prior chest radiographs FINDINGS: Cardiomegaly, CABG changes and LEFT IJ central venous catheter with tips overlying the mid and LOWER SVC noted. Mild bibasilar opacities/atelectasis noted. A small RIGHT pleural effusion is unchanged. No pneumothorax or acute bony abnormality. IMPRESSION: Mild bibasilar opacities/slightly favor atelectasis over airspace disease/pneumonia. Cardiomegaly and unchanged small RIGHT pleural effusion. Electronically Signed   By: Margarette Canada M.D.   On: 11/12/2018 10:41   Medications: . sodium chloride    . sodium chloride    . diltiazem (CARDIZEM) infusion 5 mg/hr (11/13/18 0100)   . benzonatate  100 mg Oral TID  . Chlorhexidine Gluconate Cloth  6 each Topical Q0600  . diltiazem  30 mg Oral  Q6H  . multivitamin  1 tablet Oral QHS  . predniSONE  40 mg Oral Q breakfast  . sevelamer carbonate  1,600 mg Oral TID WC  . umeclidinium-vilanterol  1 puff Inhalation Daily    Dialysis Orders: Center: [facility]  on MWF. Time 4 hr, 180NRe Optiflux, BFR 400, DFR 800 EDW 96kg; 3K, 2.25Ca Heparin: None (thrombocytopenia) Venofer 50 mg IV q week Mircera 50 mcg IVP q2 weeks (last dose: 11/04/2018) Calcitriol 0.49mcg PO 3x week during dialysis  Assessment/Plan: 1.  Shortness of breath: Likely multifactorial due to a.fib RVR, COPD. CXR 2/27 showed mild bibasilar opacities favoring atelectasis, cardiomegaly, and unchanged small R pleural effusion Some volume overload on exam today.  2. Atrial fibrillation with RVR: HR controlled at present. On diltiazem. No anticoagulation due to history of  thrombocytopenia. Management per primary. 3. COPD: Symptoms persisting with + wheezing, on prednisone. Management per primary. 4.  ESRD:  Normally MWF, had dialysis yesterday with 1L net UF. K 4.5. Contacted outpatient unit to determine if patient can be changed to TTS schedule, waiting to hear back. Will plan for HD tomorrow.Slowly lower vol 5.  Hypertension/volume: BP now overall controlled, some volume overload on exam. Below outpatient EDW, likely needs to be lowered at discharge. Next HD tomorrow with UF goal 3LL. 6.  Anemia: Hgb 10.5. start  ESA at this time.  7.  Metabolic bone disease: Ca 8.8, corrected 9.0. Phos 3.5, binder changed to Renvela per patient request. Continue calcitriol. 8.  Nutrition: Albumin 3.7.   9. Restless legs: Will start renal vitamin, follow to determine if symptoms improve. 10. Thrombocytopenia: Hold heparin. Consider referral to hematology at discharge.   Anice Paganini, PA-C 11/13/2018, 8:49 AM  [facility] Pager: [phone] I have seen and examined this patient and agree with the plan of care seen, eval, examined, counseled, changes made .  Jeneen Rinks Anzleigh Slaven 11/13/2018, 10:06 AM

## 2018-11-13 NOTE — Progress Notes (Signed)
   Subjective: Larry Klein was doing okay today, no acute events overnight.  He did go to dialysis yesterday and tolerated it well.  He reports that he still having some shortness of breath, states that he feels about the same as yesterday.  He denies any chest pain or palpitations.  We discussed that his heart rate is better controlled today, that we will be transitioning him from the IV diltiazem to oral diltiazem.  We also discussed that his shortness of breath sounds to be multifactorial, will be continuing to treat his fluid status, COPD, and atrial fibrillation.  All questions were answered.   Objective:  Vital signs in last 24 hours: Vitals:   11/12/18 1815 11/12/18 1856 11/12/18 2028 11/13/18 0418  BP: 125/80 125/84 101/89 138/82  Pulse: 92 88 81 89  Resp: 20  17   Temp: 98 F (36.7 C) 97.6 F (36.4 C) 97.8 F (36.6 C) (!) 97.4 F (36.3 C)  TempSrc: Oral Oral  Oral  SpO2: 99% 99% 98% 98%  Weight: 96 kg 95.8 kg  95.7 kg  Height:  5\' 11"  (1.803 m)      General: Well-developed, well-nourished male, in no acute distress Cardiac: Irregularly irregular, no murmurs rubs or gallops Pulmonary: Diffuse wheezing throughout, normal effort, no accessory muscles usage Abdomen: Obese, nontender, soft, normal bowel sounds Extremity: Lower extremity edema, amputated left foot, blister start    Assessment/Plan:  Active Problems:   Atrial fibrillation Larry Klein) This is a 63 year old male with a history of ESRD (hemodialysis Monday Wednesday Friday), atrial fibrillation(not on any coagulation or rate controlling medications), chronic thrombocytopenia, CAD status post CABG in 2017, AAA status post repair, hypertension, hyperlipidemia, COPD who presented with a one-week history of worsening shortness of breath. He was found to be tachypneic, hypertensive and tachycardic. Labs significant for normal CBC and BMP.   Atrial fibrillation with RVR: -Currently rate controlled on diltiazem drip, getting  5mg /hr at this time. Will transition to oral diltiazem today.  He denied any chest pain, or palpitations today.  He is not on any anticoagulation likely secondary to his thrombocytopenia.  His A. fib may have been exacerbated by his increased usage of his inhalers and nebulizers. -Start diltiazem 30 mg every 6 hours -Discontinue diltiazem drip 1 hour after receiving first dose -Continue telemetry  Acute COPD exacerbation: Worsening dyspnea: Patient is still having significant shortness of breath, no longer requiring oxygen this morning, is a normal work of breathing, no accessory muscle usage, however still has diffuse wheezing on exam.  He had 1 L ultrafiltrate taken off during dialysis yesterday, still may have some fluid overload however his shortness of breath may be more related to the acute COPD exacerbation.  Nephrology plans for dialysis tomorrow with a UF goal of 3 L.  We will add maintenance inhaler today. -Continue prednisone, day 3 of a 5-day course -Continue Pulmicort nebulizer as needed -Start Anoro 1 puff daily -Supplemental oxygen as needed  ESRD on HD MWF: -Nephrology following, appreciate recommendation -HD per nephro, switch to TTS schedule -Daily BMP  Thrombocytopenia: -Platelets 32 today, denies any symptoms of bleeding today. -Daily CBC -Monitor for bleeding  CAD s/p CABG: -CABG in 2017, not on any medications at home.  FEN: No fluids, replete lytes prn, renal diet  VTE ppx: SCDs Code Status: FULL    Dispo: Anticipated discharge is pending clinical improvement  Asencion Noble, MD 11/13/2018, 6:36 AM Pager: [phone]

## 2018-11-14 DIAGNOSIS — I4891 Unspecified atrial fibrillation: Secondary | ICD-10-CM

## 2018-11-14 LAB — RENAL FUNCTION PANEL
Albumin: 3.8 g/dL (ref 3.5–5.0)
Albumin: 3.8 g/dL (ref 3.5–5.0)
Albumin: 3.9 g/dL (ref 3.5–5.0)
Anion gap: 17 — ABNORMAL HIGH (ref 5–15)
Anion gap: 14 (ref 5–15)
Anion gap: 15 (ref 5–15)
BUN: 35 mg/dL — AB (ref 8–23)
BUN: 71 mg/dL — ABNORMAL HIGH (ref 8–23)
BUN: 39 mg/dL — ABNORMAL HIGH (ref 8–23)
CO2: 20 mmol/L — ABNORMAL LOW (ref 22–32)
CO2: 23 mmol/L (ref 22–32)
CO2: 23 mmol/L (ref 22–32)
Calcium: 8.8 mg/dL — ABNORMAL LOW (ref 8.9–10.3)
Calcium: 9 mg/dL (ref 8.9–10.3)
Calcium: 8.7 mg/dL — ABNORMAL LOW (ref 8.9–10.3)
Chloride: 96 mmol/L — ABNORMAL LOW (ref 98–111)
Chloride: 96 mmol/L — ABNORMAL LOW (ref 98–111)
Chloride: 96 mmol/L — ABNORMAL LOW (ref 98–111)
Creatinine, Ser: 5.33 mg/dL — ABNORMAL HIGH (ref 0.61–1.24)
Creatinine, Ser: 7.17 mg/dL — ABNORMAL HIGH (ref 0.61–1.24)
Creatinine, Ser: 4.75 mg/dL — ABNORMAL HIGH (ref 0.61–1.24)
GFR calc Af Amer: 12 mL/min — ABNORMAL LOW (ref >60)
GFR calc Af Amer: 14 mL/min — ABNORMAL LOW (ref >60)
GFR calc non Af Amer: 10 mL/min — ABNORMAL LOW (ref >60)
GFR calc non Af Amer: 7 mL/min — ABNORMAL LOW (ref >60)
GFR calc non Af Amer: 12 mL/min — ABNORMAL LOW (ref >60)
GFR, EST AFRICAN AMERICAN: 8 mL/min — AB (ref >60)
Glucose, Bld: 147 mg/dL — ABNORMAL HIGH (ref 70–99)
Glucose, Bld: 129 mg/dL — ABNORMAL HIGH (ref 70–99)
Glucose, Bld: 157 mg/dL — ABNORMAL HIGH (ref 70–99)
PHOSPHORUS: 5 mg/dL — AB (ref 2.5–4.6)
POTASSIUM: 4.5 mmol/L (ref 3.5–5.1)
Phosphorus: 3.5 mg/dL (ref 2.5–4.6)
Phosphorus: 2.8 mg/dL (ref 2.5–4.6)
Potassium: 5.7 mmol/L — ABNORMAL HIGH (ref 3.5–5.1)
Potassium: 3.9 mmol/L (ref 3.5–5.1)
Sodium: 133 mmol/L — ABNORMAL LOW (ref 135–145)
Sodium: 133 mmol/L — ABNORMAL LOW (ref 135–145)
Sodium: 134 mmol/L — ABNORMAL LOW (ref 135–145)

## 2018-11-14 LAB — CBC
HCT: 35.6 % — ABNORMAL LOW (ref 39.0–52.0)
HEMATOCRIT: 37.1 % — AB (ref 39.0–52.0)
Hemoglobin: 11 g/dL — ABNORMAL LOW (ref 13.0–17.0)
Hemoglobin: 11.8 g/dL — ABNORMAL LOW (ref 13.0–17.0)
MCH: 28.5 pg (ref 26.0–34.0)
MCH: 29.1 pg (ref 26.0–34.0)
MCHC: 30.9 g/dL (ref 30.0–36.0)
MCHC: 31.8 g/dL (ref 30.0–36.0)
MCV: 92.2 fL (ref 80.0–100.0)
MCV: 91.4 fL (ref 80.0–100.0)
Platelets: 75 10*3/uL — ABNORMAL LOW (ref 150–400)
Platelets: 76 10*3/uL — ABNORMAL LOW (ref 150–400)
RBC: 3.86 MIL/uL — ABNORMAL LOW (ref 4.22–5.81)
RBC: 4.06 MIL/uL — ABNORMAL LOW (ref 4.22–5.81)
RDW: 17.2 % — ABNORMAL HIGH (ref 11.5–15.5)
RDW: 17.2 % — AB (ref 11.5–15.5)
WBC: 9.9 10*3/uL (ref 4.0–10.5)
WBC: 14.5 10*3/uL — AB (ref 4.0–10.5)
nRBC: 0.4 % — ABNORMAL HIGH (ref 0.0–0.2)
nRBC: 0.3 % — ABNORMAL HIGH (ref 0.0–0.2)

## 2018-11-14 LAB — HEPATITIS B SURFACE ANTIGEN: HEP B S AG: NEGATIVE

## 2018-11-14 MED ORDER — HEPARIN SODIUM (PORCINE) 1000 UNIT/ML IJ SOLN
INTRAMUSCULAR | Status: AC
  Filled 2018-11-14: qty 4

## 2018-11-14 MED ORDER — DARBEPOETIN ALFA 60 MCG/0.3ML IJ SOSY
PREFILLED_SYRINGE | INTRAMUSCULAR | Status: AC
  Filled 2018-11-14: qty 0.3

## 2018-11-14 MED ORDER — DILTIAZEM HCL ER 60 MG PO CP12
60.0000 mg | ORAL_CAPSULE | Freq: Once | ORAL | Status: AC
  Administered 2018-11-14: 60 mg via ORAL
  Filled 2018-11-14 (×2): qty 1

## 2018-11-14 MED ORDER — ALUM & MAG HYDROXIDE-SIMETH 200-200-20 MG/5ML PO SUSP
30.0000 mL | Freq: Once | ORAL | Status: AC
  Administered 2018-11-14: 30 mL via ORAL
  Filled 2018-11-14: qty 30

## 2018-11-14 MED ORDER — DILTIAZEM HCL ER COATED BEADS 120 MG PO CP24
120.0000 mg | ORAL_CAPSULE | Freq: Every day | ORAL | Status: DC
  Administered 2018-11-15: 120 mg via ORAL
  Filled 2018-11-14 (×2): qty 1

## 2018-11-14 MED ORDER — HEPARIN SODIUM (PORCINE) 1000 UNIT/ML IJ SOLN
3.9000 mL | Freq: Once | INTRAMUSCULAR | Status: AC
  Administered 2018-11-14: 3900 [IU] via INTRAVENOUS

## 2018-11-14 NOTE — Progress Notes (Addendum)
   Subjective: Larry Klein was doing okay today, in dialysis, no acute events overnight. He reports that he feels the same as when he came in, still having shortness of breath and doesn't feel like he can walk up the halls without getting short of breath. He reports that his itching feels is a little better today. Denies any chest pain or palpitations. Denies any new complaints. We discussed the plan for today and he is in agreement.    Objective:  Vital signs in last 24 hours: Vitals:   11/13/18 1300 11/13/18 1948 11/13/18 2348 11/14/18 0455  BP: 113/84 131/78 (!) 154/88 121/68  Pulse: 81 85 77 (!) 111  Resp: 17     Temp: 97.6 F (36.4 C) (!) 97.5 F (36.4 C) 98 F (36.7 C) 97.9 F (36.6 C)  TempSrc: Oral Oral    SpO2: 97% 98% 98% 98%  Weight:    95.4 kg  Height:        General: Elderly male, in dialysis, NAD Cardiac: Irregularly irregular, no m/r/g Pulmonary: Diffuse wheezing throughout lungs, on 2L  Abdomen: Soft, non-tender, non-distended   Assessment/Plan:  Active Problems:   Atrial fibrillation [facility]) This is a 63 year old male with a history of ESRD (hemodialysis Monday Wednesday Friday), atrial fibrillation(not on any coagulation or rate controlling medications), chronic thrombocytopenia, CAD status post CABG in 2017, AAA status post repair, hypertension, hyperlipidemia, COPD who presented with a one-week history of worsening shortness of breath. He was found to be tachypneic, hypertensive and tachycardic. Labs significant for normal CBC and BMP.   Atrial fibrillation with RVR: -Rate controlled on diltiazem 30 mg q6, he reports that he has not bene having chest pain or palpitations. His atrial fibrillation was likely 2/2 increased usage of inhalers. He is not on any anticoagulation at home likely secondary to his thrombocytopenia. -Switch to diltiazem 60 BID today, then to 120 daily tomorrow -Continue telemetry  Acute COPD exacerbation: Worsening dyspnea: He  reports that he feels about the same as when he came in, still requiring supplemental oxygen. On exam he still has diffuse wheezing, he had not gotten a breathing treatment this morning. He still appears mildly volume overloaded, is getting HD today and they plan to take off 3L. It appears that this is a mixed picture, with some volume overload and a mild COPD exacerbation.  He had added Anoro yesterday we will continue his Pulmicort nebulizer.  He may be able to restart albuterol nebulizers since he is now rate controlled. -Continue prednisone, day 3 of a 5-day course -Continue Pulmicort nebulizer as needed -Continue Anoro 1 puff daily -Restart albuterol PRN -Supplemental oxygen as needed -HD today  ESRD on HD MWF: -Nephrology following, appreciate recommendation -HD per nephro, switch to TTS schedule -HD today -Daily BMP  Thrombocytopenia: -Platelets today 75, denies any symptoms of bleeding today. -Daily CBC -Monitor for bleeding  CAD s/p CABG: -CABG in 2017, not on any medications at home.  FEN: No fluids, replete lytes prn, renal diet  VTE ppx: SCDs Code Status: FULL    Dispo: Anticipated discharge is pending clinical improvement  Asencion Noble, MD 11/14/2018, 6:40 AM Pager: [phone]

## 2018-11-14 NOTE — Progress Notes (Signed)
Subjective: Interval History: has complaints still cannot lay down and breath,has to set up..  Objective: Vital signs in last 24 hours: Temp:  [97.5 F (36.4 C)-98 F (36.7 C)] 97.9 F (36.6 C) (02/29 0455) Pulse Rate:  [77-111] 111 (02/29 0455) Resp:  [17] 17 (02/28 1300) BP: (113-154)/(68-88) 121/68 (02/29 0455) SpO2:  [97 %-98 %] 98 % (02/29 0455) Weight:  [95.4 kg] 95.4 kg (02/29 0455) Weight change: -1.9 kg  Intake/Output from previous day: 02/28 0701 - 02/29 0700 In: 960 [P.O.:960] Out: -  Intake/Output this shift: No intake/output data recorded.  General appearance: cooperative, mild distress and pale Resp: rales bibasilar and wheezes bibasilar Cardio: S1, S2 normal and systolic murmur: systolic ejection 2/6, crescendo and decrescendo at 2nd left intercostal space GI: pos bs, liver down 5 cm Extremities: avf, 1+ edema  Lab Results: Recent Labs    11/12/18 0957  11/12/18 1031 11/13/18 0416  WBC 9.9  --   --  6.8  HGB 10.9*   < > 11.6* 10.5*  HCT 36.9*   < > 34.0* 33.6*  PLT 30*  --   --  32*   < > = values in this interval not displayed.   BMET:  Recent Labs    11/13/18 0416 11/14/18 0421  NA 133* 133*  K 4.5 5.7*  CL 96* 96*  CO2 20* 23  GLUCOSE 147* 129*  BUN 35* 71*  CREATININE 5.33* 7.17*  CALCIUM 8.8* 9.0   No results for input(s): PTH in the last 72 hours. Iron Studies: No results for input(s): IRON, TIBC, TRANSFERRIN, FERRITIN in the last 72 hours.  Studies/Results: Dg Chest Port 1 View  Result Date: 11/12/2018 CLINICAL DATA:  Acute shortness of breath. EXAM: PORTABLE CHEST 1 VIEW COMPARISON:  09/17/2018 and prior chest radiographs FINDINGS: Cardiomegaly, CABG changes and LEFT IJ central venous catheter with tips overlying the mid and LOWER SVC noted. Mild bibasilar opacities/atelectasis noted. A small RIGHT pleural effusion is unchanged. No pneumothorax or acute bony abnormality. IMPRESSION: Mild bibasilar opacities/slightly favor atelectasis  over airspace disease/pneumonia. Cardiomegaly and unchanged small RIGHT pleural effusion. Electronically Signed   By: Margarette Canada M.D.   On: 11/12/2018 10:41    I have reviewed the patient's current medications.  Assessment/Plan: 1 ESRD for HD. Will try to resolve vol component of resp prob. 2 Anemia stable 3 COPD still bronchospasm 4 HPTH 5 Prostate Ca 6 Afib now NSR P HD , control afib, bronchodilators    LOS: 2 days   Larry Klein Larry Klein 11/14/2018,8:01 AM

## 2018-11-14 NOTE — Procedures (Signed)
I was present at this session.  I have reviewed the session itself and made appropriate changes.  HD via cath , will try for 3 L,  bp in 120s-130s.   Larry Klein 2/29/20208:05 AM

## 2018-11-15 DIAGNOSIS — Z7901 Long term (current) use of anticoagulants: Secondary | ICD-10-CM

## 2018-11-15 DIAGNOSIS — Z7951 Long term (current) use of inhaled steroids: Secondary | ICD-10-CM

## 2018-11-15 DIAGNOSIS — Z79899 Other long term (current) drug therapy: Secondary | ICD-10-CM

## 2018-11-15 MED ORDER — DILTIAZEM HCL ER COATED BEADS 120 MG PO CP24: 120.0000 mg | ORAL_CAPSULE | Freq: Every day | ORAL | Status: DC

## 2018-11-15 MED ORDER — UMECLIDINIUM-VILANTEROL 62.5-25 MCG/INH IN AEPB: 1.0000 | INHALATION_SPRAY | Freq: Every day | RESPIRATORY_TRACT | 0 refills | Status: DC

## 2018-11-15 MED ORDER — PREDNISONE 20 MG PO TABS: 40.0000 mg | ORAL_TABLET | Freq: Every day | ORAL | 0 refills | Status: DC

## 2018-11-15 MED ORDER — DILTIAZEM HCL ER COATED BEADS 120 MG PO CP24: 120.0000 mg | ORAL_CAPSULE | Freq: Every day | ORAL | 0 refills | Status: DC

## 2018-11-15 NOTE — Progress Notes (Signed)
Pt had 12 beat run of VT. PT asymptomatic and vital signs stable. MD made aware via  Amion.

## 2018-11-15 NOTE — Discharge Instructions (Signed)
Make sure to resume your Monday Wednesday Friday HD schedule.

## 2018-11-15 NOTE — Progress Notes (Addendum)
   Subjective: [name] reported feeling okay this morning. He reports his breathing was doing better. He is continuing to have abdominal pain and feels this is the cause of his atrial fibrillation and COPD exacerbation. It was explained to him that he needs to establish with a PCP to help work up this abdominal pain. All questions and concerns were addressed.   Objective:  Vital signs in last 24 hours: Vitals:   11/14/18 1607 11/14/18 2124 11/15/18 0040 11/15/18 0425  BP: (!) 144/90 115/86 118/70 (!) 130/94  Pulse: 73 95 80 80  Resp: 18 17 18 19   Temp: 99.7 F (37.6 C) 97.6 F (36.4 C) (!) 97.4 F (36.3 C) 97.9 F (36.6 C)  TempSrc: Oral   Oral  SpO2: 95% 98% 98% 98%  Weight:    92.8 kg  Height:       Physical Exam:  General: Elderly male, sitting up comfortably at the bedside, NAD Pulmonary: CTA bilaterally, on room air CV: irregularly irregular, no murmurs or gallops  Skin: warm, dry Psych: normal mood and affect  Assessment/Plan:  Active Problems:   Atrial fibrillation [facility])  [name] is a 63 year old male with a history of ESRD on HD,atrial fibrillation(not on any coagulation or rate controlling medications), chronic thrombocytopenia,CAD status post CABG in 2017, AAA status post repair, hypertension, hyperlipidemia, COPD who presented with a one-week history of worsening shortness of breath.He was found to be tachypneic, hypertensive and tachycardic.  Atrial fibrillation with RVR: -Rate controlled on diltiazem 60 BID, will transition to 120 daily today -Denies any chest pain or palpitations  - He is not on any anticoagulation at home likely secondary to his thrombocytopenia. -Switch to diltiazem 60 BID today, then to 120 daily tomorrow  Acute COPD exacerbation: Worsening dyspnea: - Seen breathing comfortably on room air today - Lung exam was unremarkable, no wheezing or rales  -Continue prednisone, total of 5 days -Continue Pulmicort nebulizer as  needed -Continue Anoro 1 puff daily -Restart albuterol PRN  ESRD on HD MWF: -Nephrology following, appreciate recommendations -Spoke with Dr. Jimmy Footman, patient is to resume MWF HD schedule    Dispo: Patient is medically stable for discharge today.   Larry Craze, DO 11/15/2018, 6:47 AM Pager: [phone]

## 2018-11-15 NOTE — Discharge Summary (Signed)
Name: Larry Klein MRN: [id number] DOB: [birth year] 63 y.o. PCP: Patient, No Pcp Per  Date of Admission: 11/12/2018  9:29 AM Date of Discharge: 11/15/2018 Attending Physician: Aldine Contes, MD  Discharge Diagnosis: 1. Atrial fibrillation with RVR 2. Acute COPD exacerbation 3. ESRD on HD  4. Thrombocytopenia   Discharge Medications: Allergies as of 11/15/2018   No Known Allergies     Medication List    TAKE these medications   acetaminophen 500 MG tablet Commonly known as:  TYLENOL Take 1,000 mg by mouth as needed for mild pain or headache.   albuterol 108 (90 Base) MCG/ACT inhaler Commonly known as:  PROVENTIL HFA;VENTOLIN HFA Inhale 2 puffs into the lungs every 6 (six) hours as needed for wheezing.   calcium carbonate 500 MG chewable tablet Commonly known as:  TUMS - dosed in mg elemental calcium Chew 2 tablets by mouth 3 (three) times daily before meals.   diltiazem 120 MG 24 hr capsule Commonly known as:  CARDIZEM CD Take 1 capsule (120 mg total) by mouth at bedtime. Start taking on:  November 16, 2018   ipratropium-albuterol 0.5-2.5 (3) MG/3ML Soln Commonly known as:  DUONEB Take 3 mLs by nebulization every 6 (six) hours as needed (wheezing).   predniSONE 20 MG tablet Commonly known as:  DELTASONE Take 2 tablets (40 mg total) by mouth daily with breakfast. Start taking on:  November 16, 2018   umeclidinium-vilanterol 62.5-25 MCG/INH Aepb Commonly known as:  ANORO ELLIPTA Inhale 1 puff into the lungs daily for 30 days. Start taking on:  November 16, 2018       Disposition and follow-up:   [name] was discharged from [facility] in Stable condition.  At the hospital follow up visit please address:  1.  Atrial fibrillation-patient started on diltiazem 120 mg daily, please assess for compliance  Thrombocytopenia- Patient has a history of thrombocytopenia, this was noted on his admission in January and he had testing for heparin associated  thrombocytopenia at that time which was negative.  He apparently has had been having issues with his blood counts since he was a teenager, platelets as low as 30 on this admission.   COPD-please make sure patient has been compliant with prednisone, pulmicort nebulizer, albuterol and Anoro  2.  Labs / imaging needed at time of follow-up: cbc  3.  Pending labs/ test needing follow-up: none  Follow-up Appointments: Patient states he is going to establish care with a new PCP.   Hospital Course by problem list: 1. Atrial fibrillation with RVR- Patient presented with a one week history of worsening shortness of breath. Found to be tachypneic, hypertensive and tachycardic.  He was found to be in A. fib with RVR.  He is not on any anticoagulation due to his chronic thrombocytopenia or rate controlling medications at home.  He was started on diltiazem drip and converted to diltiazem 120 mg daily.  Remained rate control. 2. Acute COPD exacerbation- most likely related to fluid overload after missing HD and his atrial fibrillation with RVR versus mild COPD exacerbation given his increase in inhaler use and diffuse wheezing on exam.  There were no signs of infection.  He was started on prednisone and continued Pulmicort nebulizers as needed. 3. ESRD on HD -nephrology followed and patient received HD during hospital course.  Discharged to resume his Monday Wednesday Friday schedule. 4.Thrombocytopenia- Patient has a history of thrombocytopenia, this was noted on his admission in January and he had testing for heparin  associated thrombocytopenia at that time which was negative.  He apparently has had been having issues with his blood counts since he was a teenager, platelets as low as 30 on this admission. He was not started on any anticoagulation due to low platelet counts.  Discharge Vitals:   BP (!) 149/91 (BP Location: Right Arm)   Pulse 88   Temp 97.8 F (36.6 C) (Oral)   Resp 18   Ht 5\' 11"  (1.803 m)    Wt 92.8 kg   SpO2 98%   BMI 28.53 kg/m   Pertinent Labs, Studies, and Procedures:   CBC Latest Ref Rng & Units 11/14/2018 11/14/2018 11/13/2018  WBC 4.0 - 10.5 K/uL 14.5(H) 9.9 6.8  Hemoglobin 13.0 - 17.0 g/dL 11.8(L) 11.0(L) 10.5(L)  Hematocrit 39.0 - 52.0 % 37.1(L) 35.6(L) 33.6(L)  Platelets 150 - 400 K/uL 76(L) 75(L) 32(L)   CMP Latest Ref Rng & Units 11/14/2018 11/14/2018 11/13/2018  Glucose 70 - 99 mg/dL 157(H) 129(H) 147(H)  BUN 8 - 23 mg/dL 39(H) 71(H) 35(H)  Creatinine 0.61 - 1.24 mg/dL 4.75(H) 7.17(H) 5.33(H)  Sodium 135 - 145 mmol/L 134(L) 133(L) 133(L)  Potassium 3.5 - 5.1 mmol/L 3.9 5.7(H) 4.5  Chloride 98 - 111 mmol/L 96(L) 96(L) 96(L)  CO2 22 - 32 mmol/L 23 23 20(L)  Calcium 8.9 - 10.3 mg/dL 8.7(L) 9.0 8.8(L)  Total Protein 6.5 - 8.1 g/dL - - -  Total Bilirubin 0.3 - 1.2 mg/dL - - -  Alkaline Phos 38 - 126 U/L - - -  AST 15 - 41 U/L - - -  ALT 0 - 44 U/L - - -   Dg Chest Port 1 View  Result Date: 11/12/2018 CLINICAL DATA:  Acute shortness of breath. EXAM: PORTABLE CHEST 1 VIEW COMPARISON:  09/17/2018 and prior chest radiographs FINDINGS: Cardiomegaly, CABG changes and LEFT IJ central venous catheter with tips overlying the mid and LOWER SVC noted. Mild bibasilar opacities/atelectasis noted. A small RIGHT pleural effusion is unchanged. No pneumothorax or acute bony abnormality. IMPRESSION: Mild bibasilar opacities/slightly favor atelectasis over airspace disease/pneumonia. Cardiomegaly and unchanged small RIGHT pleural effusion. Electronically Signed   By: Margarette Canada M.D.   On: 11/12/2018 10:41   Discharge Instructions: Discharge Instructions    Diet - low sodium heart healthy   Complete by:  As directed    Discharge instructions   Complete by:  As directed    Larry Klein,   Please note the following changes to your medications: -START taking diltiazem 120 mg once daily -CONTINUE taking prednisone 40 mg once a day for two more days, take one dose tomorrow and the  last dose on Tuesday -CONTINUE Anoro Ellipta inhaler 1 puff daily  Please make sure to follow up with your new PCP so they can help manage your chronic conditions and work up your abdominal pain.  Thanks for allowing [place] to be a part of your care   Increase activity slowly   Complete by:  As directed       Signed: ,  N, DO 11/15/2018, 10:32 AM

## 2018-11-15 NOTE — Progress Notes (Signed)
Subjective: Interval History: has complaints , reluctantly says breathing better, PT is waste of time.  Dialysis is hard..Not getting breathing tx  Objective: Vital signs in last 24 hours: Temp:  [97.4 F (36.3 C)-99.7 F (37.6 C)] 97.9 F (36.6 C) (03/01 0425) Pulse Rate:  [64-95] 80 (03/01 0425) Resp:  [17-19] 19 (03/01 0425) BP: (115-153)/(62-94) 153/79 (03/01 0734) SpO2:  [95 %-98 %] 98 % (03/01 0425) Weight:  [92.8 kg] 92.8 kg (03/01 0425) Weight change: -2.6 kg  Intake/Output from previous day: 02/29 0701 - 03/01 0700 In: 444 [P.O.:444] Out: 2800  Intake/Output this shift: Total I/O In: 360 [P.O.:360] Out: -   General appearance: alert, cooperative, no distress and plethoric face Resp: diminished breath sounds bilaterally Chest wall: IJ PC Cardio: S1, S2 normal and systolic murmur: systolic ejection 2/6, crescendo and decrescendo at 2nd left intercostal space GI: soft, pos bs, liver down 5 cm Extremities: avf   Lab Results: Recent Labs    11/14/18 0806 11/14/18 1801  WBC 9.9 14.5*  HGB 11.0* 11.8*  HCT 35.6* 37.1*  PLT 75* 76*   BMET:  Recent Labs    11/14/18 0421 11/14/18 1801  NA 133* 134*  K 5.7* 3.9  CL 96* 96*  CO2 23 23  GLUCOSE 129* 157*  BUN 71* 39*  CREATININE 7.17* 4.75*  CALCIUM 9.0 8.7*   No results for input(s): PTH in the last 72 hours. Iron Studies: No results for input(s): IRON, TIBC, TRANSFERRIN, FERRITIN in the last 72 hours.  Studies/Results: No results found.  I have reviewed the patient's current medications.  Assessment/Plan: 1 ESRD HD going well. He is displeased to be on and has very little insight into self care, or his status 2 Anemia [place] lower vol 3 HPTH vit D 4 COPD improved 5 Afib use hs cardiazem 6 Lack of insight.  This will be on going issue P counseled, HD tues, cont meds.hs cardiazem     LOS: 3 days   Jeneen Rinks Jennavieve Arrick 11/15/2018,9:11 AM

## 2018-12-01 ENCOUNTER — Ambulatory Visit: Payer: Self-pay | Admitting: Cardiology

## 2018-12-02 ENCOUNTER — Encounter (HOSPITAL_COMMUNITY): Payer: Self-pay

## 2018-12-04 ENCOUNTER — Telehealth: Payer: Self-pay | Admitting: Emergency Medicine

## 2018-12-04 NOTE — Telephone Encounter (Signed)
Left message for patient to return call regarding appointment.

## 2018-12-04 NOTE — Telephone Encounter (Signed)
   Primary Cardiologist:  No primary care provider on file.   Patient contacted.  History reviewed.  No symptoms to suggest any unstable cardiac conditions.  Based on discussion, with current pandemic situation, we will be postponing this appointment for @PATIENTNAME @.  If symptoms change, he has been instructed to contact our office.   Routing to C19 CANCEL pool for tracking (P CV DIV CV19 CANCEL) and assigning priority (1 = 4-6 wks, 2 = 6-12 wks, 3 = >12 wks).  Ashok Norris, RN  12/04/2018 5:07 PM   Patient recently diagnosed with pneumonia today I will inform Dr. Agustin Cree.       Marland Kitchen

## 2018-12-07 ENCOUNTER — Inpatient Hospital Stay (HOSPITAL_COMMUNITY)
Admission: AD | Admit: 2018-12-07 | Discharge: 2018-12-13 | DRG: 308 | Disposition: A | Discharge status: Home / Self Care | Payer: Medicare Other | Source: Other Acute Inpatient Hospital | Attending: Internal Medicine | Admitting: Internal Medicine

## 2018-12-07 ENCOUNTER — Ambulatory Visit: Payer: Self-pay | Admitting: Cardiology

## 2018-12-07 DIAGNOSIS — E7800 Pure hypercholesterolemia, unspecified: Secondary | ICD-10-CM | POA: Diagnosis present

## 2018-12-07 DIAGNOSIS — I519 Heart disease, unspecified: Secondary | ICD-10-CM

## 2018-12-07 DIAGNOSIS — Z87891 Personal history of nicotine dependence: Secondary | ICD-10-CM | POA: Diagnosis not present

## 2018-12-07 DIAGNOSIS — I252 Old myocardial infarction: Secondary | ICD-10-CM

## 2018-12-07 DIAGNOSIS — Z951 Presence of aortocoronary bypass graft: Secondary | ICD-10-CM

## 2018-12-07 DIAGNOSIS — I132 Hypertensive heart and chronic kidney disease with heart failure and with stage 5 chronic kidney disease, or end stage renal disease: Secondary | ICD-10-CM | POA: Diagnosis present

## 2018-12-07 DIAGNOSIS — Z7952 Long term (current) use of systemic steroids: Secondary | ICD-10-CM | POA: Diagnosis not present

## 2018-12-07 DIAGNOSIS — I2510 Atherosclerotic heart disease of native coronary artery without angina pectoris: Secondary | ICD-10-CM | POA: Diagnosis not present

## 2018-12-07 DIAGNOSIS — I080 Rheumatic disorders of both mitral and aortic valves: Secondary | ICD-10-CM | POA: Diagnosis not present

## 2018-12-07 DIAGNOSIS — D631 Anemia in chronic kidney disease: Secondary | ICD-10-CM | POA: Diagnosis not present

## 2018-12-07 DIAGNOSIS — I4891 Unspecified atrial fibrillation: Secondary | ICD-10-CM | POA: Diagnosis present

## 2018-12-07 DIAGNOSIS — R17 Unspecified jaundice: Secondary | ICD-10-CM | POA: Diagnosis not present

## 2018-12-07 DIAGNOSIS — D696 Thrombocytopenia, unspecified: Secondary | ICD-10-CM | POA: Diagnosis not present

## 2018-12-07 DIAGNOSIS — N186 End stage renal disease: Secondary | ICD-10-CM | POA: Diagnosis present

## 2018-12-07 DIAGNOSIS — J069 Acute upper respiratory infection, unspecified: Secondary | ICD-10-CM | POA: Diagnosis not present

## 2018-12-07 DIAGNOSIS — I5022 Chronic systolic (congestive) heart failure: Secondary | ICD-10-CM | POA: Diagnosis not present

## 2018-12-07 DIAGNOSIS — I429 Cardiomyopathy, unspecified: Secondary | ICD-10-CM

## 2018-12-07 DIAGNOSIS — Z992 Dependence on renal dialysis: Secondary | ICD-10-CM | POA: Diagnosis not present

## 2018-12-07 DIAGNOSIS — J441 Chronic obstructive pulmonary disease with (acute) exacerbation: Secondary | ICD-10-CM | POA: Diagnosis not present

## 2018-12-07 DIAGNOSIS — Z79899 Other long term (current) drug therapy: Secondary | ICD-10-CM | POA: Diagnosis not present

## 2018-12-07 DIAGNOSIS — R079 Chest pain, unspecified: Secondary | ICD-10-CM | POA: Diagnosis present

## 2018-12-07 DIAGNOSIS — Z8249 Family history of ischemic heart disease and other diseases of the circulatory system: Secondary | ICD-10-CM | POA: Diagnosis not present

## 2018-12-07 DIAGNOSIS — E785 Hyperlipidemia, unspecified: Secondary | ICD-10-CM | POA: Diagnosis not present

## 2018-12-07 DIAGNOSIS — E8889 Other specified metabolic disorders: Secondary | ICD-10-CM | POA: Diagnosis not present

## 2018-12-07 DIAGNOSIS — E1122 Type 2 diabetes mellitus with diabetic chronic kidney disease: Secondary | ICD-10-CM | POA: Diagnosis present

## 2018-12-07 DIAGNOSIS — I4821 Permanent atrial fibrillation: Principal | ICD-10-CM | POA: Diagnosis present

## 2018-12-07 DIAGNOSIS — N2581 Secondary hyperparathyroidism of renal origin: Secondary | ICD-10-CM | POA: Diagnosis not present

## 2018-12-07 DIAGNOSIS — R0600 Dyspnea, unspecified: Secondary | ICD-10-CM

## 2018-12-07 DIAGNOSIS — I5023 Acute on chronic systolic (congestive) heart failure: Secondary | ICD-10-CM | POA: Diagnosis present

## 2018-12-07 HISTORY — DX: Dyspnea, unspecified: R06.00

## 2018-12-07 LAB — CBC WITH DIFFERENTIAL/PLATELET
Abs Immature Granulocytes: 0.27 10*3/uL — ABNORMAL HIGH (ref 0.00–0.07)
BASOS ABS: 0 10*3/uL (ref 0.0–0.1)
Basophils Relative: 0 %
Eosinophils Absolute: 0 10*3/uL (ref 0.0–0.5)
Eosinophils Relative: 0 %
HCT: 35.4 % — ABNORMAL LOW (ref 39.0–52.0)
Hemoglobin: 11.4 g/dL — ABNORMAL LOW (ref 13.0–17.0)
Immature Granulocytes: 2 %
Lymphocytes Relative: 5 %
Lymphs Abs: 0.6 10*3/uL — ABNORMAL LOW (ref 0.7–4.0)
MCH: 29 pg (ref 26.0–34.0)
MCHC: 32.2 g/dL (ref 30.0–36.0)
MCV: 90.1 fL (ref 80.0–100.0)
Monocytes Absolute: 0.7 10*3/uL (ref 0.1–1.0)
Monocytes Relative: 6 %
NRBC: 2.3 % — AB (ref 0.0–0.2)
Neutro Abs: 10.4 10*3/uL — ABNORMAL HIGH (ref 1.7–7.7)
Neutrophils Relative %: 87 %
Platelets: 214 10*3/uL (ref 150–400)
RBC: 3.93 MIL/uL — ABNORMAL LOW (ref 4.22–5.81)
RDW: 18.6 % — ABNORMAL HIGH (ref 11.5–15.5)
WBC: 12 10*3/uL — AB (ref 4.0–10.5)

## 2018-12-07 LAB — MRSA PCR SCREENING: MRSA by PCR: NEGATIVE

## 2018-12-07 LAB — BRAIN NATRIURETIC PEPTIDE: B NATRIURETIC PEPTIDE 5: 1637.3 pg/mL — AB (ref 0.0–100.0)

## 2018-12-07 MED ORDER — UMECLIDINIUM-VILANTEROL 62.5-25 MCG/INH IN AEPB
1.0000 | INHALATION_SPRAY | Freq: Every day | RESPIRATORY_TRACT | Status: DC
  Administered 2018-12-13: 1 via RESPIRATORY_TRACT
  Filled 2018-12-07: qty 14

## 2018-12-07 MED ORDER — ASPIRIN EC 81 MG PO TBEC
81.0000 mg | DELAYED_RELEASE_TABLET | Freq: Every day | ORAL | Status: DC
  Administered 2018-12-08 – 2018-12-13 (×6): 81 mg via ORAL
  Filled 2018-12-07 (×6): qty 1

## 2018-12-07 MED ORDER — CALCIUM CARBONATE ANTACID 500 MG PO CHEW
2.0000 | CHEWABLE_TABLET | Freq: Three times a day (TID) | ORAL | Status: DC
  Administered 2018-12-08 – 2018-12-13 (×14): 400 mg via ORAL
  Filled 2018-12-07 (×14): qty 2

## 2018-12-07 MED ORDER — HEPARIN (PORCINE) 25000 UT/250ML-% IV SOLN: 1200.0000 [IU]/h | INTRAVENOUS | Status: DC

## 2018-12-07 MED ORDER — HEPARIN BOLUS VIA INFUSION
3000.0000 [IU] | Freq: Once | INTRAVENOUS | Status: AC
  Administered 2018-12-07: 3000 [IU] via INTRAVENOUS
  Filled 2018-12-07: qty 3000

## 2018-12-07 MED ORDER — DILTIAZEM HCL-DEXTROSE 100-5 MG/100ML-% IV SOLN (PREMIX)
5.0000 mg/h | INTRAVENOUS | Status: DC
  Administered 2018-12-07: 15 mg/h via INTRAVENOUS
  Filled 2018-12-07 (×2): qty 100

## 2018-12-07 MED ORDER — DILTIAZEM HCL ER COATED BEADS 120 MG PO CP24: 120.0000 mg | ORAL_CAPSULE | Freq: Every day | ORAL | Status: DC

## 2018-12-07 MED ORDER — ACETAMINOPHEN 325 MG PO TABS
650.0000 mg | ORAL_TABLET | Freq: Four times a day (QID) | ORAL | Status: DC | PRN
  Administered 2018-12-08: 650 mg via ORAL
  Filled 2018-12-07: qty 2

## 2018-12-07 MED ORDER — ACETAMINOPHEN 650 MG RE SUPP: 650.0000 mg | Freq: Four times a day (QID) | RECTAL | Status: DC | PRN

## 2018-12-07 NOTE — Progress Notes (Signed)
ANTICOAGULATION CONSULT NOTE - Initial Consult  Pharmacy Consult for heparin Indication: atrial fibrillation  No Known Allergies  Patient Measurements: Height: 5\' 11"  (180.3 cm) Weight: 212 lb 8.4 oz (96.4 kg) IBW/kg (Calculated) : 75.3 Heparin Dosing Weight: 94.5 kg  Vital Signs: Temp: 97.4 F (36.3 C) (03/23 2048) Temp Source: Oral (03/23 2048) BP: 133/95 (03/23 2048) Pulse Rate: 102 (03/23 2048)  Labs: No results for input(s): HGB, HCT, PLT, APTT, LABPROT, INR, HEPARINUNFRC, HEPRLOWMOCWT, CREATININE, CKTOTAL, CKMB, TROPONINI in the last 72 hours.  CrCl cannot be calculated (Patient's most recent lab result is older than the maximum 21 days allowed.).   Medical History: Past Medical History:  Diagnosis Date  . COPD (chronic obstructive pulmonary disease) ([place])   . Coronary artery disease   . High cholesterol   . Hypertension   . Renal disorder     Medications:  Medications Prior to Admission  Medication Sig Dispense Refill Last Dose  . acetaminophen (TYLENOL) 500 MG tablet Take 1,000 mg by mouth as needed for mild pain or headache.   11/11/2018 at Unknown time  . albuterol (PROVENTIL HFA;VENTOLIN HFA) 108 (90 Base) MCG/ACT inhaler Inhale 2 puffs into the lungs every 6 (six) hours as needed for wheezing.   11/12/2018 at Unknown time  . calcium carbonate (TUMS - DOSED IN MG ELEMENTAL CALCIUM) 500 MG chewable tablet Chew 2 tablets by mouth 3 (three) times daily before meals.   11/09/2018  . diltiazem (CARDIZEM CD) 120 MG 24 hr capsule Take 1 capsule (120 mg total) by mouth at bedtime. 30 capsule 0   . ipratropium-albuterol (DUONEB) 0.5-2.5 (3) MG/3ML SOLN Take 3 mLs by nebulization every 6 (six) hours as needed (wheezing).   11/12/2018 at Unknown time  . predniSONE (DELTASONE) 20 MG tablet Take 2 tablets (40 mg total) by mouth daily with breakfast. 4 tablet 0   . umeclidinium-vilanterol (ANORO ELLIPTA) 62.5-25 MCG/INH AEPB Inhale 1 puff into the lungs daily for 30 days. 30  each 0     Assessment: 63 yo man with afib to start heparin therapy.  He has h/o thrombocytopenia.  He was not on anticoagulation PTA.  Will dose conservatively initially due to low PTLC. Goal of Therapy:  Heparin level 0.3-0.7 units/ml Monitor platelets by anticoagulation protocol: Yes   Plan:  Heparin 3000 unit bolus and drip at 1200 units/hr Check heparin level 6-8 hours after start Daily HL and CBC while on heparin Monitor for bleeding complications  Thanks for allowing pharmacy to be a part of this patient's care.  Excell Seltzer, PharmD Clinical Pharmacist 12/07/2018,11:21 PM

## 2018-12-08 ENCOUNTER — Observation Stay (HOSPITAL_COMMUNITY): Payer: Medicare Other

## 2018-12-08 ENCOUNTER — Encounter (HOSPITAL_COMMUNITY): Payer: Self-pay | Admitting: *Deleted

## 2018-12-08 ENCOUNTER — Other Ambulatory Visit: Payer: Self-pay

## 2018-12-08 DIAGNOSIS — E7800 Pure hypercholesterolemia, unspecified: Secondary | ICD-10-CM | POA: Diagnosis not present

## 2018-12-08 DIAGNOSIS — I252 Old myocardial infarction: Secondary | ICD-10-CM | POA: Diagnosis not present

## 2018-12-08 DIAGNOSIS — J441 Chronic obstructive pulmonary disease with (acute) exacerbation: Secondary | ICD-10-CM | POA: Diagnosis not present

## 2018-12-08 DIAGNOSIS — J069 Acute upper respiratory infection, unspecified: Secondary | ICD-10-CM | POA: Diagnosis present

## 2018-12-08 DIAGNOSIS — Z7952 Long term (current) use of systemic steroids: Secondary | ICD-10-CM | POA: Diagnosis not present

## 2018-12-08 DIAGNOSIS — I080 Rheumatic disorders of both mitral and aortic valves: Secondary | ICD-10-CM | POA: Diagnosis present

## 2018-12-08 DIAGNOSIS — Z8249 Family history of ischemic heart disease and other diseases of the circulatory system: Secondary | ICD-10-CM | POA: Diagnosis not present

## 2018-12-08 DIAGNOSIS — E1122 Type 2 diabetes mellitus with diabetic chronic kidney disease: Secondary | ICD-10-CM | POA: Diagnosis not present

## 2018-12-08 DIAGNOSIS — I4821 Permanent atrial fibrillation: Secondary | ICD-10-CM | POA: Diagnosis not present

## 2018-12-08 DIAGNOSIS — I5022 Chronic systolic (congestive) heart failure: Secondary | ICD-10-CM | POA: Diagnosis not present

## 2018-12-08 DIAGNOSIS — Z992 Dependence on renal dialysis: Secondary | ICD-10-CM | POA: Diagnosis not present

## 2018-12-08 DIAGNOSIS — I429 Cardiomyopathy, unspecified: Secondary | ICD-10-CM | POA: Diagnosis not present

## 2018-12-08 DIAGNOSIS — I361 Nonrheumatic tricuspid (valve) insufficiency: Secondary | ICD-10-CM

## 2018-12-08 DIAGNOSIS — R079 Chest pain, unspecified: Secondary | ICD-10-CM | POA: Diagnosis present

## 2018-12-08 DIAGNOSIS — D631 Anemia in chronic kidney disease: Secondary | ICD-10-CM | POA: Diagnosis not present

## 2018-12-08 DIAGNOSIS — R0600 Dyspnea, unspecified: Secondary | ICD-10-CM | POA: Diagnosis not present

## 2018-12-08 DIAGNOSIS — I4891 Unspecified atrial fibrillation: Secondary | ICD-10-CM

## 2018-12-08 DIAGNOSIS — R17 Unspecified jaundice: Secondary | ICD-10-CM | POA: Diagnosis not present

## 2018-12-08 DIAGNOSIS — I340 Nonrheumatic mitral (valve) insufficiency: Secondary | ICD-10-CM

## 2018-12-08 DIAGNOSIS — R52 Pain, unspecified: Secondary | ICD-10-CM

## 2018-12-08 DIAGNOSIS — I132 Hypertensive heart and chronic kidney disease with heart failure and with stage 5 chronic kidney disease, or end stage renal disease: Secondary | ICD-10-CM | POA: Diagnosis not present

## 2018-12-08 DIAGNOSIS — Z87891 Personal history of nicotine dependence: Secondary | ICD-10-CM | POA: Diagnosis not present

## 2018-12-08 DIAGNOSIS — E785 Hyperlipidemia, unspecified: Secondary | ICD-10-CM | POA: Diagnosis not present

## 2018-12-08 DIAGNOSIS — I2510 Atherosclerotic heart disease of native coronary artery without angina pectoris: Secondary | ICD-10-CM | POA: Diagnosis present

## 2018-12-08 DIAGNOSIS — D696 Thrombocytopenia, unspecified: Secondary | ICD-10-CM | POA: Diagnosis not present

## 2018-12-08 DIAGNOSIS — E8889 Other specified metabolic disorders: Secondary | ICD-10-CM | POA: Diagnosis present

## 2018-12-08 DIAGNOSIS — Z951 Presence of aortocoronary bypass graft: Secondary | ICD-10-CM | POA: Diagnosis not present

## 2018-12-08 DIAGNOSIS — N2581 Secondary hyperparathyroidism of renal origin: Secondary | ICD-10-CM | POA: Diagnosis not present

## 2018-12-08 DIAGNOSIS — N186 End stage renal disease: Secondary | ICD-10-CM | POA: Diagnosis not present

## 2018-12-08 DIAGNOSIS — Z79899 Other long term (current) drug therapy: Secondary | ICD-10-CM | POA: Diagnosis not present

## 2018-12-08 LAB — COMPREHENSIVE METABOLIC PANEL
ALT: 77 U/L — ABNORMAL HIGH (ref 0–44)
AST: 39 U/L (ref 15–41)
Albumin: 3.7 g/dL (ref 3.5–5.0)
Alkaline Phosphatase: 57 U/L (ref 38–126)
Anion gap: 18 — ABNORMAL HIGH (ref 5–15)
BUN: 91 mg/dL — ABNORMAL HIGH (ref 8–23)
CO2: 16 mmol/L — ABNORMAL LOW (ref 22–32)
Calcium: 8.7 mg/dL — ABNORMAL LOW (ref 8.9–10.3)
Chloride: 103 mmol/L (ref 98–111)
Creatinine, Ser: 8.14 mg/dL — ABNORMAL HIGH (ref 0.61–1.24)
GFR calc Af Amer: 7 mL/min — ABNORMAL LOW (ref >60)
GFR, EST NON AFRICAN AMERICAN: 6 mL/min — AB (ref >60)
Glucose, Bld: 122 mg/dL — ABNORMAL HIGH (ref 70–99)
Potassium: 5.2 mmol/L — ABNORMAL HIGH (ref 3.5–5.1)
Sodium: 137 mmol/L (ref 135–145)
Total Bilirubin: 0.8 mg/dL (ref 0.3–1.2)
Total Protein: 6.2 g/dL — ABNORMAL LOW (ref 6.5–8.1)

## 2018-12-08 LAB — CBC
HCT: 33.2 % — ABNORMAL LOW (ref 39.0–52.0)
Hemoglobin: 10.8 g/dL — ABNORMAL LOW (ref 13.0–17.0)
MCH: 29 pg (ref 26.0–34.0)
MCHC: 32.5 g/dL (ref 30.0–36.0)
MCV: 89 fL (ref 80.0–100.0)
Platelets: 197 10*3/uL (ref 150–400)
RBC: 3.73 MIL/uL — ABNORMAL LOW (ref 4.22–5.81)
RDW: 18.2 % — ABNORMAL HIGH (ref 11.5–15.5)
WBC: 11.9 10*3/uL — ABNORMAL HIGH (ref 4.0–10.5)
nRBC: 1.9 % — ABNORMAL HIGH (ref 0.0–0.2)

## 2018-12-08 LAB — BASIC METABOLIC PANEL
Anion gap: 22 — ABNORMAL HIGH (ref 5–15)
BUN: 97 mg/dL — ABNORMAL HIGH (ref 8–23)
CO2: 15 mmol/L — AB (ref 22–32)
Calcium: 8.6 mg/dL — ABNORMAL LOW (ref 8.9–10.3)
Chloride: 102 mmol/L (ref 98–111)
Creatinine, Ser: 8.58 mg/dL — ABNORMAL HIGH (ref 0.61–1.24)
GFR calc Af Amer: 7 mL/min — ABNORMAL LOW (ref >60)
GFR, EST NON AFRICAN AMERICAN: 6 mL/min — AB (ref >60)
GLUCOSE: 144 mg/dL — AB (ref 70–99)
Potassium: 5.1 mmol/L (ref 3.5–5.1)
Sodium: 139 mmol/L (ref 135–145)

## 2018-12-08 LAB — HEPARIN LEVEL (UNFRACTIONATED)
HEPARIN UNFRACTIONATED: 0.2 [IU]/mL — AB (ref 0.30–0.70)
Heparin Unfractionated: 0.24 IU/mL — ABNORMAL LOW (ref 0.30–0.70)
Heparin Unfractionated: 0.69 IU/mL (ref 0.30–0.70)

## 2018-12-08 LAB — ECHOCARDIOGRAM LIMITED
Height: 71 in
Weight: 3308.66 oz

## 2018-12-08 LAB — TROPONIN I
TROPONIN I: 0.21 ng/mL — AB (ref <0.03)
TROPONIN I: 0.22 ng/mL — AB (ref <0.03)
Troponin I: 0.2 ng/mL (ref <0.03)

## 2018-12-08 LAB — TSH: TSH: 0.296 u[IU]/mL — ABNORMAL LOW (ref 0.350–4.500)

## 2018-12-08 LAB — GLUCOSE, CAPILLARY: Glucose-Capillary: 91 mg/dL (ref 70–99)

## 2018-12-08 MED ORDER — ACETAMINOPHEN 325 MG PO TABS
650.0000 mg | ORAL_TABLET | Freq: Four times a day (QID) | ORAL | Status: DC | PRN
  Administered 2018-12-09 – 2018-12-12 (×3): 650 mg via ORAL
  Filled 2018-12-08 (×4): qty 2

## 2018-12-08 MED ORDER — PENTAFLUOROPROP-TETRAFLUOROETH EX AERO: 1.0000 "application " | INHALATION_SPRAY | CUTANEOUS | Status: DC | PRN

## 2018-12-08 MED ORDER — ALTEPLASE 2 MG IJ SOLR: 2.0000 mg | Freq: Once | INTRAMUSCULAR | Status: DC | PRN

## 2018-12-08 MED ORDER — RENA-VITE PO TABS
1.0000 | ORAL_TABLET | Freq: Every day | ORAL | Status: DC
  Administered 2018-12-08 – 2018-12-12 (×5): 1 via ORAL
  Filled 2018-12-08 (×6): qty 1

## 2018-12-08 MED ORDER — LIDOCAINE-PRILOCAINE 2.5-2.5 % EX CREA: 1.0000 "application " | TOPICAL_CREAM | CUTANEOUS | Status: DC | PRN

## 2018-12-08 MED ORDER — SODIUM CHLORIDE 0.9 % IV SOLN: 100.0000 mL | INTRAVENOUS | Status: DC | PRN

## 2018-12-08 MED ORDER — LIDOCAINE HCL (PF) 1 % IJ SOLN: 5.0000 mL | INTRAMUSCULAR | Status: DC | PRN

## 2018-12-08 MED ORDER — HEPARIN SODIUM (PORCINE) 1000 UNIT/ML DIALYSIS: 1000.0000 [IU] | INTRAMUSCULAR | Status: DC | PRN

## 2018-12-08 MED ORDER — OFF THE BEAT BOOK
Freq: Once | Status: AC
  Administered 2018-12-08: 05:00:00

## 2018-12-08 MED ORDER — TRAMADOL HCL 50 MG PO TABS
50.0000 mg | ORAL_TABLET | Freq: Four times a day (QID) | ORAL | Status: DC | PRN
  Administered 2018-12-08: 50 mg via ORAL
  Administered 2018-12-09: 25 mg via ORAL
  Filled 2018-12-08 (×2): qty 1

## 2018-12-08 MED ORDER — DILTIAZEM HCL ER COATED BEADS 120 MG PO CP24
120.0000 mg | ORAL_CAPSULE | Freq: Every day | ORAL | Status: DC
  Administered 2018-12-08: 120 mg via ORAL
  Filled 2018-12-08 (×2): qty 1

## 2018-12-08 MED ORDER — HEPARIN SODIUM (PORCINE) 1000 UNIT/ML IJ SOLN
INTRAMUSCULAR | Status: AC
  Administered 2018-12-08: 1900 [IU] via INTRAVENOUS_CENTRAL
  Filled 2018-12-08: qty 4

## 2018-12-08 MED ORDER — CHLORHEXIDINE GLUCONATE CLOTH 2 % EX PADS: 6.0000 | MEDICATED_PAD | Freq: Every day | CUTANEOUS | Status: DC

## 2018-12-08 MED ORDER — HEPARIN (PORCINE) 25000 UT/250ML-% IV SOLN
1350.0000 [IU]/h | INTRAVENOUS | Status: DC
  Administered 2018-12-08: 1350 [IU]/h via INTRAVENOUS
  Filled 2018-12-08: qty 250

## 2018-12-08 MED ORDER — HEPARIN SODIUM (PORCINE) 1000 UNIT/ML DIALYSIS
20.0000 [IU]/kg | INTRAMUSCULAR | Status: DC | PRN
  Administered 2018-12-08: 1900 [IU] via INTRAVENOUS_CENTRAL
  Filled 2018-12-08: qty 2

## 2018-12-08 MED ORDER — CALCITRIOL 0.25 MCG PO CAPS
0.2500 ug | ORAL_CAPSULE | ORAL | Status: DC
  Administered 2018-12-08 – 2018-12-12 (×3): 0.25 ug via ORAL

## 2018-12-08 MED ORDER — CALCITRIOL 0.25 MCG PO CAPS
ORAL_CAPSULE | ORAL | Status: AC
  Filled 2018-12-08: qty 1

## 2018-12-08 MED ORDER — IPRATROPIUM-ALBUTEROL 0.5-2.5 (3) MG/3ML IN SOLN
3.0000 mL | Freq: Four times a day (QID) | RESPIRATORY_TRACT | Status: DC | PRN
  Administered 2018-12-08 – 2018-12-11 (×4): 3 mL via RESPIRATORY_TRACT
  Filled 2018-12-08 (×4): qty 3

## 2018-12-08 MED ORDER — CHLORHEXIDINE GLUCONATE CLOTH 2 % EX PADS
6.0000 | MEDICATED_PAD | Freq: Every day | CUTANEOUS | Status: DC
  Administered 2018-12-09: 6 via TOPICAL

## 2018-12-08 MED ORDER — HEPARIN SODIUM (PORCINE) 1000 UNIT/ML DIALYSIS
20.0000 [IU]/kg | INTRAMUSCULAR | Status: DC | PRN
  Administered 2018-12-08: 1900 [IU] via INTRAVENOUS_CENTRAL
  Filled 2018-12-08: qty 1.9

## 2018-12-08 MED ORDER — HEPARIN (PORCINE) 25000 UT/250ML-% IV SOLN
1800.0000 [IU]/h | INTRAVENOUS | Status: DC
  Administered 2018-12-09: 1500 [IU]/h via INTRAVENOUS
  Administered 2018-12-10: 1600 [IU]/h via INTRAVENOUS
  Administered 2018-12-10: 1800 [IU]/h via INTRAVENOUS
  Filled 2018-12-08 (×3): qty 250

## 2018-12-08 MED ORDER — ATORVASTATIN CALCIUM 40 MG PO TABS
40.0000 mg | ORAL_TABLET | Freq: Every day | ORAL | Status: DC
  Administered 2018-12-08 – 2018-12-12 (×5): 40 mg via ORAL
  Filled 2018-12-08 (×6): qty 1

## 2018-12-08 NOTE — Progress Notes (Addendum)
ANTICOAGULATION CONSULT NOTE -Follow up  Pharmacy Consult for heparin Indication: atrial fibrillation  No Known Allergies  Patient Measurements: Height: 5\' 11"  (180.3 cm) Weight: 206 lb 12.7 oz (93.8 kg) IBW/kg (Calculated) : 75.3 Heparin Dosing Weight: 94.5 kg  Vital Signs: Temp: 97.6 F (36.4 C) (03/24 1442) Temp Source: Oral (03/24 1442) BP: 116/71 (03/24 1442) Pulse Rate: 86 (03/24 1442)  Labs: Recent Labs    12/07/18 2237 12/08/18 0316 12/08/18 0727 12/08/18 1458  HGB 11.4* 10.8*  --   --   HCT 35.4* 33.2*  --   --   PLT 214 197  --   --   HEPARINUNFRC  --   --  0.24* 0.69  CREATININE 8.14* 8.58*  --   --   TROPONINI 0.21* 0.20*  --  0.22*    Estimated Creatinine Clearance: 10.2 mL/min (A) (by C-G formula based on SCr of 8.58 mg/dL (H)).   Medical History: Past Medical History:  Diagnosis Date  . COPD (chronic obstructive pulmonary disease) ([place])   . Coronary artery disease   . Dyspnea   . High cholesterol   . Hypertension   . Renal disorder     Medications:  Medications Prior to Admission  Medication Sig Dispense Refill Last Dose  . acetaminophen (TYLENOL) 500 MG tablet Take 1,000 mg by mouth as needed for mild pain or headache.   11/11/2018 at Unknown time  . albuterol (PROVENTIL HFA;VENTOLIN HFA) 108 (90 Base) MCG/ACT inhaler Inhale 2 puffs into the lungs every 6 (six) hours as needed for wheezing.   11/12/2018 at Unknown time  . calcium carbonate (TUMS - DOSED IN MG ELEMENTAL CALCIUM) 500 MG chewable tablet Chew 2 tablets by mouth 3 (three) times daily before meals.   11/09/2018  . diltiazem (CARDIZEM CD) 120 MG 24 hr capsule Take 1 capsule (120 mg total) by mouth at bedtime. 30 capsule 0   . ipratropium-albuterol (DUONEB) 0.5-2.5 (3) MG/3ML SOLN Take 3 mLs by nebulization every 6 (six) hours as needed (wheezing).   11/12/2018 at Unknown time  . predniSONE (DELTASONE) 20 MG tablet Take 2 tablets (40 mg total) by mouth daily with breakfast. 4 tablet 0   .  umeclidinium-vilanterol (ANORO ELLIPTA) 62.5-25 MCG/INH AEPB Inhale 1 puff into the lungs daily for 30 days. 30 each 0     Assessment: 63 yo male with afib, started IV heparin therapy.  He has h/o thrombocytopenia.  He was not on anticoagulation PTA.  We are dosing conservatively initially due to low PTLC.   Heparin level 0.69, therapeutic after heparin rate increased to 1350 units/hr.  No bleeding reported.  Heparin level has increased to top of therapeutic goal.  Due to h/o choronic thrombocytopenia, I will reduce heparin rate    Goal of Therapy:  Heparin level 0.3-0.7 units/ml Monitor platelets by anticoagulation protocol: Yes   Plan:  Decrease Heparin drip to 1300 units/hr Check heparin level 6 hours to confirm remains therapeutic.  Daily HL and CBC while on heparin Monitor for bleeding complications  Thanks for allowing pharmacy to be a part of this patient's care.  Nicole Cella, RPh Clinical Pharmacist 12/08/2018,4:00 PM

## 2018-12-08 NOTE — Progress Notes (Signed)
  Echocardiogram 2D Echocardiogram has been performed.  Larry Klein 12/08/2018, 4:50 PM

## 2018-12-08 NOTE — Progress Notes (Addendum)
ANTICOAGULATION CONSULT NOTE - Initial Consult  Pharmacy Consult for heparin Indication: atrial fibrillation  No Known Allergies  Patient Measurements: Height: 5\' 11"  (180.3 cm) Weight: 210 lb 15.7 oz (95.7 kg) IBW/kg (Calculated) : 75.3 Heparin Dosing Weight: 94.5 kg  Vital Signs: Temp: 97.6 F (36.4 C) (03/24 0755) Temp Source: Oral (03/24 0755) BP: 122/79 (03/24 0755) Pulse Rate: 69 (03/24 0755)  Labs: Recent Labs    12/07/18 2237 12/08/18 0316 12/08/18 0727  HGB 11.4* 10.8*  --   HCT 35.4* 33.2*  --   PLT 214 197  --   HEPARINUNFRC  --   --  0.24*  CREATININE 8.14* 8.58*  --   TROPONINI 0.21* 0.20*  --     Estimated Creatinine Clearance: 10.3 mL/min (A) (by C-G formula based on SCr of 8.58 mg/dL (H)).   Medical History: Past Medical History:  Diagnosis Date  . COPD (chronic obstructive pulmonary disease) ([place])   . Coronary artery disease   . High cholesterol   . Hypertension   . Renal disorder     Medications:  Medications Prior to Admission  Medication Sig Dispense Refill Last Dose  . acetaminophen (TYLENOL) 500 MG tablet Take 1,000 mg by mouth as needed for mild pain or headache.   11/11/2018 at Unknown time  . albuterol (PROVENTIL HFA;VENTOLIN HFA) 108 (90 Base) MCG/ACT inhaler Inhale 2 puffs into the lungs every 6 (six) hours as needed for wheezing.   11/12/2018 at Unknown time  . calcium carbonate (TUMS - DOSED IN MG ELEMENTAL CALCIUM) 500 MG chewable tablet Chew 2 tablets by mouth 3 (three) times daily before meals.   11/09/2018  . diltiazem (CARDIZEM CD) 120 MG 24 hr capsule Take 1 capsule (120 mg total) by mouth at bedtime. 30 capsule 0   . ipratropium-albuterol (DUONEB) 0.5-2.5 (3) MG/3ML SOLN Take 3 mLs by nebulization every 6 (six) hours as needed (wheezing).   11/12/2018 at Unknown time  . predniSONE (DELTASONE) 20 MG tablet Take 2 tablets (40 mg total) by mouth daily with breakfast. 4 tablet 0   . umeclidinium-vilanterol (ANORO ELLIPTA) 62.5-25  MCG/INH AEPB Inhale 1 puff into the lungs daily for 30 days. 30 each 0     Assessment: 63 yo man with afib to start heparin therapy.  He has h/o thrombocytopenia.  He was not on anticoagulation PTA.  Will dose conservatively initially due to low PTLC.   Heparin level subtherapeutic at 0.24 on heparin 1200 units/hr.  No interruptions with IV heparin infusion and no bleeding noted per RN.   Goal of Therapy:  Heparin level 0.3-0.7 units/ml Monitor platelets by anticoagulation protocol: Yes   Plan:  Increase Heparin drip to 1350 units/hr Check heparin level 6 hours after start Daily HL and CBC while on heparin Monitor for bleeding complications  Thanks for allowing pharmacy to be a part of this patient's care.  Nicole Cella, RPh Clinical Pharmacist 12/08/2018,8:19 AM

## 2018-12-08 NOTE — H&P (Signed)
History and Physical    Larry Klein TOI:[id number] DOB: [birth year] DOA: 12/07/2018  PCP: Patient, No Pcp Per  Patient coming from: Patient was transferred from [facility].  Chief Complaint: Chest pain.  HPI: Larry Klein is a 63 y.o. male with history of CAD status post CABG, COPD, ESRD on hemodialysis Monday Wednesday Friday, A. fib not on anticoagulation secondary to chronic thrombocytopenia presents to the ER at [facility] with complaint of chest pain.  Patient chest pain was retrosternal nonradiating present even at rest increased on exertion over the last couple of days.  Denies any fever chills nausea vomiting diarrhea or productive cough.  Has been to his pulmonologist recently and was treated for pneumonia as per the patient.  Patient was recently admitted to medical service internal medicine teaching service for A. fib with RVR.  Patient states during last admission he was given a medication for heart rate control which he had taken for 10 days and he did not have refill.  ED Course: In the ER patient was afebrile with temperature of 97.4 pulse of 114 in A. fib respiration 20/min pulse ox is 95% blood pressure was 118/73.  WBC count was 13.5 hemoglobin 12.3 platelets 257.  EKG showed A. fib with RVR chest x-ray was showing persistent hazy densities in the right lower chest minimal change from comparison examination which was for November 12, 2018.  Troponin was 0.28.  proBNP was 46,000.  AST 37 ALT 72 total bilirubin 0.7.  Patient was started on Cardizem infusion and heparin for A. fib with RVR.  At the time of my exam in the room when patient arrived patient was chest pain-free.  Not wheezing.  Patient is also complaining of some vague sensations of both lower extremity.  Pulses are dopplerable.  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  Review of Systems: As per HPI, rest all negative.   Past Medical History:  Diagnosis Date  . COPD (chronic  obstructive pulmonary disease) ([place])   . Coronary artery disease   . High cholesterol   . Hypertension   . Renal disorder     Past Surgical History:  Procedure Laterality Date  . CARDIAC SURGERY    . THROMBECTOMY W/ EMBOLECTOMY Left 09/18/2018   Procedure: ARTERIOVENOUS FISTULA LEFT ARM;  Surgeon: Rosetta Posner, MD;  Location: [place];  Service: Vascular;  Laterality: Left;     reports that he quit smoking about 3 years ago. He has never used smokeless tobacco. He reports previous alcohol use. He reports that he does not use drugs.  No Known Allergies  No family history on file.  Prior to Admission medications   Medication Sig Start Date End Date Taking? Authorizing Provider  acetaminophen (TYLENOL) 500 MG tablet Take 1,000 mg by mouth as needed for mild pain or headache.    [provider]  albuterol (PROVENTIL HFA;VENTOLIN HFA) 108 (90 Base) MCG/ACT inhaler Inhale 2 puffs into the lungs every 6 (six) hours as needed for wheezing.    [provider]  calcium carbonate (TUMS - DOSED IN MG ELEMENTAL CALCIUM) 500 MG chewable tablet Chew 2 tablets by mouth 3 (three) times daily before meals.    [provider]  diltiazem (CARDIZEM CD) 120 MG 24 hr capsule Take 1 capsule (120 mg total) by mouth at bedtime. 11/16/18   Rehman, Areeg N, DO  ipratropium-albuterol (DUONEB) 0.5-2.5 (3) MG/3ML SOLN Take 3 mLs by nebulization every 6 (six) hours as needed (wheezing).  [provider]  predniSONE (DELTASONE) 20 MG tablet Take 2 tablets (40 mg total) by mouth daily with breakfast. 11/16/18   Rehman, Areeg N, DO  umeclidinium-vilanterol (ANORO ELLIPTA) 62.5-25 MCG/INH AEPB Inhale 1 puff into the lungs daily for 30 days. 11/16/18 12/16/18  Mike Craze, DO    Physical Exam: Vitals:   12/07/18 2046 12/07/18 2048 12/07/18 2356  BP:  (!) 133/95 (!) 128/94  Pulse:  (!) 102 92  Resp:  (!) 22 20  Temp:  (!) 97.4 F (36.3 C) (!) 97.5 F (36.4 C)  TempSrc:  Oral Oral   SpO2:  99% 98%  Weight: 96.4 kg    Height: 5\' 11"  (1.803 m)        Constitutional: Moderately built and nourished. Vitals:   12/07/18 2046 12/07/18 2048 12/07/18 2356  BP:  (!) 133/95 (!) 128/94  Pulse:  (!) 102 92  Resp:  (!) 22 20  Temp:  (!) 97.4 F (36.3 C) (!) 97.5 F (36.4 C)  TempSrc:  Oral Oral  SpO2:  99% 98%  Weight: 96.4 kg    Height: 5\' 11"  (1.803 m)     Eyes: Anicteric no pallor. ENMT: No discharge from the ears eyes nose and mouth. Neck: Mass felt.  No neck rigidity. Respiratory: No rhonchi or crepitations. Cardiovascular: S1-S2 heard. Abdomen: Soft nontender bowel sounds present. Musculoskeletal: Chronic skin changes.  Left lower extremity has transmetatarsal amputation with dressing.  Pulses are dopplerable. Skin: Chronic skin changes. Neurologic: Alert awake oriented to time place and person.  Moves all extremities. Psychiatric: Appears normal.  Normal affect.   Labs on Admission: I have personally reviewed following labs and imaging studies  CBC: Recent Labs  Lab 12/07/18 2237  WBC 12.0*  NEUTROABS 10.4*  HGB 11.4*  HCT 35.4*  MCV 90.1  PLT 734   Basic Metabolic Panel: No results for input(s): NA, K, CL, CO2, GLUCOSE, BUN, CREATININE, CALCIUM, MG, PHOS in the last 168 hours. GFR: CrCl cannot be calculated (Patient's most recent lab result is older than the maximum 21 days allowed.). Liver Function Tests: No results for input(s): AST, ALT, ALKPHOS, BILITOT, PROT, ALBUMIN in the last 168 hours. No results for input(s): LIPASE, AMYLASE in the last 168 hours. No results for input(s): AMMONIA in the last 168 hours. Coagulation Profile: No results for input(s): INR, PROTIME in the last 168 hours. Cardiac Enzymes: No results for input(s): CKTOTAL, CKMB, CKMBINDEX, TROPONINI in the last 168 hours. BNP (last 3 results) No results for input(s): PROBNP in the last 8760 hours. HbA1C: No results for input(s): HGBA1C in the last 72 hours. CBG: No  results for input(s): GLUCAP in the last 168 hours. Lipid Profile: No results for input(s): CHOL, HDL, LDLCALC, TRIG, CHOLHDL, LDLDIRECT in the last 72 hours. Thyroid Function Tests: No results for input(s): TSH, T4TOTAL, FREET4, T3FREE, THYROIDAB in the last 72 hours. Anemia Panel: No results for input(s): VITAMINB12, FOLATE, FERRITIN, TIBC, IRON, RETICCTPCT in the last 72 hours. Urine analysis: No results found for: COLORURINE, APPEARANCEUR, LABSPEC, PHURINE, GLUCOSEU, HGBUR, BILIRUBINUR, KETONESUR, PROTEINUR, UROBILINOGEN, NITRITE, LEUKOCYTESUR Sepsis Labs: @LABRCNTIP (procalcitonin:4,lacticidven:4) ) Recent Results (from the past 240 hour(s))  MRSA PCR Screening     Status: None   Collection Time: 12/07/18  9:07 PM  Result Value Ref Range Status   MRSA by PCR NEGATIVE NEGATIVE Final    Comment:        The GeneXpert MRSA Assay (FDA approved for NASAL specimens only), is one component of a comprehensive MRSA  colonization surveillance program. It is not intended to diagnose MRSA infection nor to guide or monitor treatment for MRSA infections. Performed at [facility], [place] [address], [place], [place] [address]      Radiological Exams on Admission: No results found.  EKG: Independently reviewed.  A. fib with RVR.  Assessment/Plan Active Problems:   ESRD (end stage renal disease) on dialysis [facility])   Atrial fibrillation with RVR ([facility])   Chest pain    1. A. fib with RVR started on Cardizem infusion and at this time since patient's platelet counts are normal was started on heparin infusion.  We will cycle cardiac markers check TSH and start patient on Cardizem CD which was given last time and try to wean off the Cardizem infusion. 2. Chest pain with history of CAD status post CABG -likely precipitated by A. fib with RVR.  Presently chest pain-free.  Cardizem CD has been started.  Cycle cardiac markers.  Consult cardiology. 3. ESRD on hemodialysis on Tuesday Thursdays  and Saturday has not missed his dialysis.  Consult nephrology.  Does not look fluid overload. 4. History of chronic thrombocytopenia has had recent heparin-induced thrombocytopenia panel done which was negative.  During this admission platelets are normal.  Patient is on heparin.  Closely follow platelet counts. 5. History of COPD not presently wheezing. 6. Treated for pneumonia has no signs of pneumonia at this time.  Chest x-ray still shows persistent infiltrates.  Will need follow-up as outpatient. 7. Anemia likely from ESRD follow CBC. 8. Lower extremity uneasiness with dopplerable pulse will check ABI.   DVT prophylaxis: Heparin infusion. Code Status: Full code. Family Communication: Discussed with patient. Disposition Plan: Home. Consults called: Cardiology. Admission status: Observation.   Rise Patience MD [facility] Pager [phone].  If 7PM-7AM, please contact night-coverage [email or web address] Password [facility]  12/08/2018, 12:03 AM

## 2018-12-08 NOTE — Procedures (Signed)
I was present at this dialysis session. I have reviewed the session itself and made appropriate changes.   Vital signs in last 24 hours:  Temp:  [97.4 F (36.3 C)-97.6 F (36.4 C)] 97.5 F (36.4 C) (03/24 0923) Pulse Rate:  [64-102] 68 (03/24 1200) Resp:  [13-22] 13 (03/24 1130) BP: (111-144)/(56-117) 144/97 (03/24 1200) SpO2:  [98 %-100 %] 100 % (03/24 0915) Weight:  [95.7 kg-96.4 kg] 95.7 kg (03/24 0915) Weight change:  Filed Weights   12/07/18 2046 12/08/18 0655 12/08/18 0915  Weight: 96.4 kg 95.7 kg 95.7 kg    Recent Labs  Lab 12/08/18 0316  NA 139  K 5.1  CL 102  CO2 15*  GLUCOSE 144*  BUN 97*  CREATININE 8.58*  CALCIUM 8.6*    Recent Labs  Lab 12/07/18 2237 12/08/18 0316  WBC 12.0* 11.9*  NEUTROABS 10.4*  --   HGB 11.4* 10.8*  HCT 35.4* 33.2*  MCV 90.1 89.0  PLT 214 197    Scheduled Meds: . aspirin EC  81 mg Oral Daily  . atorvastatin  40 mg Oral q1800  . calcitRIOL  0.25 mcg Oral Q T,Th,Sa-HD  . calcium carbonate  2 tablet Oral TID AC  . Chlorhexidine Gluconate Cloth  6 each Topical Q0600  . diltiazem  120 mg Oral QHS  . multivitamin  1 tablet Oral QHS  . umeclidinium-vilanterol  1 puff Inhalation Daily   Continuous Infusions: . sodium chloride    . sodium chloride    . heparin 1,200 Units/hr (12/07/18 2324)  . heparin 1,350 Units/hr (12/08/18 0847)   PRN Meds:.sodium chloride, sodium chloride, acetaminophen **OR** acetaminophen, alteplase, heparin, heparin, heparin, lidocaine (PF), lidocaine-prilocaine, pentafluoroprop-tetrafluoroeth, traMADol   Donetta Potts,  MD 12/08/2018, 12:25 PM

## 2018-12-08 NOTE — Consult Note (Signed)
   I have seen and evaluated patient on dialysis.  He currently has a catheter.  I am unsure that this fistula is ever going to mature enough and so would not proceed with fistulogram.  Will need vein mapping and consideration of upper arm fistula when we are able to do elective surgery again.Erlene Quan C. Donzetta Matters, MD [facility] Office: [phone] Pager: [phone]

## 2018-12-08 NOTE — Consult Note (Addendum)
Cardiology Consult    Patient ID: Hykeem Ojeda MRN: [id number], DOB/AGE: 1956-04-17   Admit date: 12/07/2018 Date of Consult: 12/08/2018  Primary Physician: Patient, No Pcp Per Primary Cardiologist: Jenne Campus, MD Requesting Provider: Gean Birchwood, MD  Patient Profile    Marvie Calender is a 63 y.o. male with a history of CAD s/p CABG x4 in 2017 in [place], [place], ESRD on hemodialysis M/W/F, atrial fibrillation not on anticoagulation secondary to chronic thrombocytopenia, hypertension, hyperlipidemia, COPD, who is being seen today for the evaluation of chest pain at the request of Dr. Hal Hope.  History of Present Illness    Mr. Caggiano is a 63 year old male with the above history. He had a CABGx4  in 2017 in [place], [place] but has done well from a cardiac standpoint since that time. He recently moved back to [place] in December 2019. He is planning on establishing care with Dr. Agustin Cree in our [place] office but has not been able to see him in the office yet. Patient was recently admitted from 11/12/2018 to 11/15/2018 for worsening shortness of breath. He was diagnosed with acute COPD exacerbation but was also found to be in atrial fibrillation with RVR. Patient was started on Diltiazem drip and was converted to PO Diltiazem 146m daily at discharge. Patient was reportedly diagnosed with pneumonia a couple of weeks ago and has increased shortness of breath since then and chest pain since that time.   [facility]. Patient reports diffuse chest pain that he describes as a tightness that has been pretty constant for the last 2 weeks. Patient states it feels like something heavy is pushing on his chest. He has this pain intermittently both at rest and with exertion since being diagnosed with pneumonia. Pain is worse when he "tries to do things too quickly." However, he is not very active so difficult to assess exertional  symptoms. Patient also reports some pleuritic pain but pain not associated with positions. He also reports a pin-like sensation at the bottom of his sternum that comes and goes which he has never experienced before. He has baseline shortness of breath but did have some worsening shortness of breath associated with his chest pain yesterday. No associated diaphoresis, nausea, or vomiting. Patient is unaware of his atrial fibrillation when rate is well controlled but has palpitations when in RVR. He does reports some palpitations yesterday but no lightheadedness or dizziness. He reports some orthopnea and PND but states this is not entirely new. He also has noticed some lower extremity edema over the last several weeks which is new for him. No fever, chills, or illnesses. No recent travel or exposure to someone with COVID-19.   In the ED, EKG showed atrial fibrillation with RVR. Initial troponin I 0.28. Chest x-ray showed persistent hazy densities in the right lower chest with minimal change from prior imaging. Pro BNP elevated at 46,700. WBC 13.5, Hgb 12.3, Plts 257. Na 138, K 5.2, Glucose 122, SCr 7.70. [facility]   At the time of this evaluation, patient is chest pain free and has not complaints.  Patient has 50 pack year smoking history but quit in 2017. He denies any alcohol or recreational drug use. Patient has a family history of heart disease with his father having a heart attack in his 673'sand his mother having some type of heart problem (unsure exactly what). No known family history of CVA.  Past Medical History  Past Medical History:  Diagnosis Date   COPD (chronic obstructive pulmonary disease) (HCC)    Coronary artery disease    High cholesterol    Hypertension    Renal disorder     Past Surgical History:  Procedure Laterality Date   CARDIAC SURGERY     THROMBECTOMY W/ EMBOLECTOMY Left 09/18/2018   Procedure: ARTERIOVENOUS FISTULA LEFT ARM;  Surgeon:  Rosetta Posner, MD;  Location: [facility] OR;  Service: Vascular;  Laterality: Left;     Allergies  No Known Allergies  Inpatient Medications     aspirin EC  81 mg Oral Daily   calcitRIOL  0.25 mcg Oral Q T,Th,Sa-HD   calcium carbonate  2 tablet Oral TID AC   Chlorhexidine Gluconate Cloth  6 each Topical Q0600   diltiazem  120 mg Oral QHS   umeclidinium-vilanterol  1 puff Inhalation Daily    Family History    No family history on file. has no family status information on file.    Social History    Social History   Socioeconomic History   Marital status: Single    Spouse name: Not on file   Number of children: Not on file   Years of education: Not on file   Highest education level: Not on file  Occupational History   Not on file  Social Needs   Financial resource strain: Not on file   Food insecurity:    Worry: Not on file    Inability: Not on file   Transportation needs:    Medical: Not on file    Non-medical: Not on file  Tobacco Use   Smoking status: Former Smoker    Last attempt to quit: 09/18/2015    Years since quitting: 3.2   Smokeless tobacco: Never Used  Substance and Sexual Activity   Alcohol use: Not Currently   Drug use: Never   Sexual activity: Not Currently  Lifestyle   Physical activity:    Days per week: Not on file    Minutes per session: Not on file   Stress: Not on file  Relationships   Social connections:    Talks on phone: Not on file    Gets together: Not on file    Attends religious service: Not on file    Active member of club or organization: Not on file    Attends meetings of clubs or organizations: Not on file    Relationship status: Not on file   Intimate partner violence:    Fear of current or ex partner: Not on file    Emotionally abused: Not on file    Physically abused: Not on file    Forced sexual activity: Not on file  Other Topics Concern   Not on file  Social History Narrative   Not on file      Review of Systems    Review of Systems  Constitutional: Negative for chills, diaphoresis and fever.  HENT: Negative for congestion.   Respiratory: Positive for cough, sputum production and shortness of breath. Negative for hemoptysis.   Cardiovascular: Positive for chest pain, palpitations, orthopnea, leg swelling and PND.  Gastrointestinal: Negative for blood in stool, nausea and vomiting.  Genitourinary: Negative for hematuria.  Musculoskeletal: Negative for myalgias.  Neurological: Negative for dizziness.  Endo/Heme/Allergies: Does not bruise/bleed easily.  Psychiatric/Behavioral: Negative for substance abuse.    Physical Exam    Blood pressure (!) 137/97, pulse 83, temperature (!) 97.5 F (36.4 C), temperature source Oral, resp. rate 14,  height _0  (1.803 m), weight 95.7 kg, SpO2 100 %.  General: 63 y.o. male resting comfortably in no acute distress. Pleasant and cooperative. HEENT: Normal  Neck: Supple. No carotid bruits or JVD appreciated. Lungs: No increased work of breathing. Diffuse expiratory wheeze throughout but no rales appreciate. Heart: Irregularly irregular rhythm with regular rate. Distinct S1 and S2. No significant murmurs, gallops, or rubs.  Abdomen: Soft, non-distended, and non-tender to palpation. Bowel sounds present in all 4 quadrants.   Extremities: Mild bilateral ankle edema. Patient s/p left transmetatarsal amputation. Radial pulses 2+ and equal bilaterally.  Skin: Warm and dry. Neuro: Alert and oriented x3. No focal deficits. Moves all extremities spontaneously. Psych: Normal affect. Responds appropriately.   Labs    Troponin (Point of Care Test) No results for input(s): TROPIPOC in the last 72 hours. Recent Labs    12/07/18 2237 12/08/18 0316  TROPONINI 0.21* 0.20*   Lab Results  Component Value Date   WBC 11.9 (H) 12/08/2018   HGB 10.8 (L) 12/08/2018   HCT 33.2 (L) 12/08/2018   MCV 89.0 12/08/2018   PLT 197 12/08/2018    Recent Labs   Lab 12/07/18 2237 12/08/18 0316  NA 137 139  K 5.2* 5.1  CL 103 102  CO2 16* 15*  BUN 91* 97*  CREATININE 8.14* 8.58*  CALCIUM 8.7* 8.6*  PROT 6.2*  --   BILITOT 0.8  --   ALKPHOS 57  --   ALT 77*  --   AST 39  --   GLUCOSE 122* 144*   No results found for: CHOL, HDL, LDLCALC, TRIG No results found for: [facility]   Radiology Studies    Dg Chest Port 1 View  Result Date: 11/12/2018 CLINICAL DATA:  Acute shortness of breath. EXAM: PORTABLE CHEST 1 VIEW COMPARISON:  09/17/2018 and prior chest radiographs FINDINGS: Cardiomegaly, CABG changes and LEFT IJ central venous catheter with tips overlying the mid and LOWER SVC noted. Mild bibasilar opacities/atelectasis noted. A small RIGHT pleural effusion is unchanged. No pneumothorax or acute bony abnormality. IMPRESSION: Mild bibasilar opacities/slightly favor atelectasis over airspace disease/pneumonia. Cardiomegaly and unchanged small RIGHT pleural effusion. Electronically Signed   By: Margarette Canada M.D.   On: 11/12/2018 10:41    EKG     EKG: EKG was personally reviewed and demonstrates: Atrial fibrillation, rate 86 bpm, with RBBB but no acute ischemic changes from prior tracings.   Telemetry: Telemetry was personally reviewed and demonstrates: Atrial fibrillation with ventricular rates in the 70's to 80's with multiple PVCs.  Cardiac Imaging    Echo pending.   Assessment & Plan    Chest Pain with Known CAD s/p CABG in 2017 - Patient presents with intermittent chest tightness at rest and with exertion over the last couple of weeks. Pain seemed to improved with improved of heart rate. Patient does have a history of CAD s/p CABG x4 in 2017 in [place], [place], but has been doing well from a cardiac standpoint since that time. - EKG shows no acute ischemic changes. - Troponin minimally elevated and flat 0.28 >> 0.21 >> 0.20. Not consistent with ACS. - Will check Echo. - Will check fasting lipid panel and hemoglobin A1c. - Patient  currently chest pain free.  - Patient does not appear to be on Aspirin, beta-blocker, or statin at home. Aspirin 34m daily has been started here. Will go ahead and start Lipitor 440mdaily.  - Presentation someone atypical. Possibly secondary to atrial fibrillation with RVR as chest pain  improved as rate improved and underlying respiratory condition with recent pneumonia and COPD. Do not anticipate any ischemic work-up at this time.  Atrial Fibrillation with RVR - Patient presented with atrial fibrillation with RVR. Currently rate controlled with ventricular rate in the 70's to 80's.  - TSH mildly low 0.296. Consider checking free T4. - Potassium 5.1 today. - Patient initial started on Diltiazem drip but has now been transitioned back to home Diltiazem 170m daily.  - CHA2DS2-VASc = 2 (HTN, CAD). Patient reportedly not on anticoagulation due to chronic thrombocytopenia. Platelets during this admission have been normal; however, they have been as low as 30 in the last few months. Will defer initiation of chronic anticoagulation to MD.  Elevated Troponin - Troponin minimally elevated and flat 0.28 >> 0.21 >> 0.20. Not consistent with ACS. Possibly secondary to ESRD and atrial fibrillation with RVR. - No ischemic work-up at this time as above.  Elevated BNP - BNP 1,637.3. - Chest x-ray showed no signs of pulmonary edema and patient does not appear significantly volume overloaded on exam. - Will check Echo.  Hypertension - Most recent BP 130/80. - Continue Diltiazem as above.  ESRD on Hemodialysis M/W/F - Receiving dialysis today. - Management per Nephrology and primary team.  Thrombocytopenia - Patient reportedly has a history of thrombocytopenia since his CABG in 2017. Platelets normal during this admission but as low as 30 in the last few months. Patient states he has never had this worked-up before. However, per discharge summary form 09/23/2018, patient reported history of  thrombocytopenia when he was a teen and reportedly had a bone marrow biopsy at that time which was normal. Recommend patient undergo work-up for chronic thrombocytopenia - this does not necessarily need to be done as an inpatient.   Otherwise, per primary team.    Signed, CDarreld Mclean PA-C 12/08/2018, 10:49 AM  For questions or updates, please contact   Please consult [email or web address] for contact info under Cardiology/STEMI. As above, patient seen and examined.  Briefly he is a 63year old male with past medical history of coronary artery disease [place], atrial fibrillation, intermittent thrombocytopenia, hypertension, hyperlipidemia, COPD for evaluation of chest pain and atrial fibrillation.  Duration of atrial fibrillation is unknown.  Patient states that for the past 2 weeks he has had shortness of breath and productive cough.  He has been treated with multiple antibiotics by his report.  However his dyspnea has persisted.  He also describes chest heaviness that is diffuse and has been continuous for 2 weeks.  Increases with cough and inspiration.  No palpitations.  He presented for further evaluation. Electrocardiogram shows atrial fibrillation with PVCs or aberrantly conducted beats, right bundle branch block and nonspecific ST changes.  Troponin is 0.21 and 0.20.  TSH 0.296.  1 chest pain-troponin minimally elevated with no clear trend.  Chest pain has been persistent for 2 weeks and increase with inspiration.  Likely related to pulmonary process.  I do not think further ischemia evaluation is indicated.  2 persistent atrial fibrillation-duration of atrial fibrillation is unknown.  We will add Cardizem for rate control.  Plan echocardiogram to assess LV function.  TSH is decreased.  Needs further evaluation by primary care.  Question contributing to atrial fibrillation. CHADSvasc 2.   However patient has had intermittent thrombocytopenia with platelet counts as low as 30.  Unclear as to previous work-up.  We will try to assess.  I  am hesitant to begin Coumadin until this is fully evaluated.  Platelet count at present is normal.  3 question pneumonia-management per primary care.  4 end-stage renal disease-dialysis per nephrology.  5 history of intermittent thrombocytopenia-platelet count has been low previously.  Needs further evaluation or records from evaluation in [place].  Kirk Ruths, MD

## 2018-12-08 NOTE — Consult Note (Addendum)
[facility] Renal Consultation Note    Indication for Consultation:  Management of ESRD/hemodialysis; anemia, hypertension/volume and secondary hyperparathyroidism PCP:  HPI: Ordean Klein is a 63 y.o. male with ESRD (TTS [facility]), CAD s/p CABG, COPD - quit smoking 2017, afib who presented with CP x several days at rest, retrosternal not radiating.  He had a recent admission late February for rapid afib and exacerbation of COPD - he was d/c on prenisone/cardizem 120 per day  Upon presentation to the ED, he was noted to be in afib with RVR, afebrile, BP not elvated WBC 13.5 plts 257 - he previously had been on no heparin HD due to "chronic low plts with hx of negative HIT.  Trop are 0.21 and 0.2 BNP 0.2 - CXR  Not done.  His EDW was lowered during his last admission then raised at his outpatient unit due to his request.   Nonetheless, he has been leaving below his current EDW. D/c weight 3/1 was 94 down from 96.  He was raised to 95.2 3/14 - but he has been getting consistently 0.5 below this with minimal gains.  Her makes urine but only a relative small amount.  He notes chronic LE edema.   Past Medical History:  Diagnosis Date  . COPD (chronic obstructive pulmonary disease) ([facility])   . Coronary artery disease   . High cholesterol   . Hypertension   . Renal disorder    Past Surgical History:  Procedure Laterality Date  . CARDIAC SURGERY    . THROMBECTOMY W/ EMBOLECTOMY Left 09/18/2018   Procedure: ARTERIOVENOUS FISTULA LEFT ARM;  Surgeon: Rosetta Posner, MD;  Location: [facility];  Service: Vascular;  Laterality: Left;   No family history on file. Social History:  reports that he quit smoking about 3 years ago. He has never used smokeless tobacco. He reports previous alcohol use. He reports that he does not use drugs. No Known Allergies Prior to Admission medications   Medication Sig Start Date End Date Taking? Authorizing Provider  acetaminophen (TYLENOL) 500 MG tablet Take 1,000 mg by  mouth as needed for mild pain or headache.    [provider]  albuterol (PROVENTIL HFA;VENTOLIN HFA) 108 (90 Base) MCG/ACT inhaler Inhale 2 puffs into the lungs every 6 (six) hours as needed for wheezing.    [provider]  calcium carbonate (TUMS - DOSED IN MG ELEMENTAL CALCIUM) 500 MG chewable tablet Chew 2 tablets by mouth 3 (three) times daily before meals.    [provider]  diltiazem (CARDIZEM CD) 120 MG 24 hr capsule Take 1 capsule (120 mg total) by mouth at bedtime. 11/16/18   Rehman, Areeg N, DO  ipratropium-albuterol (DUONEB) 0.5-2.5 (3) MG/3ML SOLN Take 3 mLs by nebulization every 6 (six) hours as needed (wheezing).    [provider]  predniSONE (DELTASONE) 20 MG tablet Take 2 tablets (40 mg total) by mouth daily with breakfast. 11/16/18   Rehman, Areeg N, DO  umeclidinium-vilanterol (ANORO ELLIPTA) 62.5-25 MCG/INH AEPB Inhale 1 puff into the lungs daily for 30 days. 11/16/18 12/16/18  Rehman, Areeg N, DO   Current Facility-Administered Medications  Medication Dose Route Frequency Provider Last Rate Last Dose  . 0.9 %  sodium chloride infusion  100 mL Intravenous PRN Alric Seton, PA-C      . 0.9 %  sodium chloride infusion  100 mL Intravenous PRN Alric Seton, PA-C      . acetaminophen (TYLENOL) tablet 650 mg  650 mg Oral Q6H PRN  Rise Patience, MD   650 mg at 12/08/18 9563   Or  . acetaminophen (TYLENOL) suppository 650 mg  650 mg Rectal Q6H PRN Rise Patience, MD      . alteplase (CATHFLO ACTIVASE) injection 2 mg  2 mg Intracatheter Once PRN Alric Seton, PA-C      . aspirin EC tablet 81 mg  81 mg Oral Daily Rise Patience, MD   81 mg at 12/08/18 0845  . calcitRIOL (ROCALTROL) capsule 0.25 mcg  0.25 mcg Oral Q T,Th,Sa-HD Alric Seton, PA-C      . calcium carbonate (TUMS - dosed in mg elemental calcium) chewable tablet 400 mg of elemental calcium  2 tablet Oral TID AC Rise Patience, MD   400 mg of elemental calcium at  12/08/18 0845  . Chlorhexidine Gluconate Cloth 2 % PADS 6 each  6 each Topical Q0600 Alric Seton, PA-C      . diltiazem (CARDIZEM CD) 24 hr capsule 120 mg  120 mg Oral QHS Rise Patience, MD   120 mg at 12/08/18 0655  . diltiazem (CARDIZEM) 100 mg in dextrose 5% 137mL (1 mg/mL) infusion  5-15 mg/hr Intravenous Titrated Rise Patience, MD   Stopped at 12/08/18 [phone]  . heparin ADULT infusion 100 units/mL (25000 units/233mL sodium chloride 0.45%)  1,200 Units/hr Intravenous Continuous Rise Patience, MD 12 mL/hr at 12/07/18 2324 1,200 Units/hr at 12/07/18 2324  . heparin ADULT infusion 100 units/mL (25000 units/29mL sodium chloride 0.45%)  1,350 Units/hr Intravenous Continuous Cherene Altes, MD 13.5 mL/hr at 12/08/18 0847 1,350 Units/hr at 12/08/18 0847  . heparin injection 1,000 Units  1,000 Units Dialysis PRN Alric Seton, PA-C      . heparin injection 1,900 Units  20 Units/kg Dialysis PRN Alric Seton, PA-C      . heparin injection 1,900 Units  20 Units/kg Dialysis PRN Alric Seton, PA-C      . lidocaine (PF) (XYLOCAINE) 1 % injection 5 mL  5 mL Intradermal PRN Alric Seton, PA-C      . lidocaine-prilocaine (EMLA) cream 1 application  1 application Topical PRN Alric Seton, PA-C      . pentafluoroprop-tetrafluoroeth (GEBAUERS) aerosol 1 application  1 application Topical PRN Alric Seton, PA-C      . traMADol Veatrice Bourbon) tablet 50 mg  50 mg Oral Q6H PRN Schorr, Rhetta Mura, NP   50 mg at 12/08/18 0243  . umeclidinium-vilanterol (ANORO ELLIPTA) 62.5-25 MCG/INH 1 puff  1 puff Inhalation Daily Rise Patience, MD       Labs: Basic Metabolic Panel: Recent Labs  Lab 12/07/18 2237 12/08/18 0316  NA 137 139  K 5.2* 5.1  CL 103 102  CO2 16* 15*  GLUCOSE 122* 144*  BUN 91* 97*  CREATININE 8.14* 8.58*  CALCIUM 8.7* 8.6*   Liver Function Tests: Recent Labs  Lab 12/07/18 2237  AST 39  ALT 77*  ALKPHOS 57  BILITOT 0.8  PROT 6.2*  ALBUMIN 3.7    No results for input(s): LIPASE, AMYLASE in the last 168 hours. No results for input(s): AMMONIA in the last 168 hours. CBC: Recent Labs  Lab 12/07/18 2237 12/08/18 0316  WBC 12.0* 11.9*  NEUTROABS 10.4*  --   HGB 11.4* 10.8*  HCT 35.4* 33.2*  MCV 90.1 89.0  PLT 214 197   Cardiac Enzymes: Recent Labs  Lab 12/07/18 2237 12/08/18 0316  TROPONINI 0.21* 0.20*   CBG: No results for input(s): GLUCAP in the last 168 hours. Iron  Studies: No results for input(s): IRON, TIBC, TRANSFERRIN, FERRITIN in the last 72 hours. Studies/Results: No results found.  ROS: As per HPI otherwise negative.  Physical Exam: Vitals:   12/08/18 0923 12/08/18 0930 12/08/18 1000 12/08/18 1030  BP: (!) 139/101 (!) 144/117 130/80 (!) 137/97  Pulse: 69 97 96 83  Resp: 18 17 (!) 21 14  Temp: (!) 97.5 F (36.4 C)     TempSrc: Oral     SpO2:      Weight:      Height:         General:  NAD - looks older than current age Head: NCAT sclera not icteric MMM Neck: Supple.  Lungs: few crackles, exp wheezes at bases Heart: irreg irreg Abdomen: soft somewhat distended +BS obese ? Component of ascites. Lower extremities: + lower extremity edema left transmet Neuro: A & O  X 3. Moves all extremities spontaneously. Psych:  Responds to questions appropriately with a normal affect. Dialysis Access: left lower AVF + bruit and left TDC at 300  Dialysis Orders: [facility]  TueThuSat, 4 hrs 0 min, 180NRe Optiflux, BFR 400, DFR Manual 800 mL/min, EDW 95.2 (kg), Dialysate 3.0 K, 2.25 Ca, 1.0 Mg, 100 Dextrose (N3231), Sodium 137 (mEq/L), Bicarb Setting: 35 (mEq/L), UFR Profile: None, Sodium Model: None, Access: CVCatheter-Tunneled, and left lower AVF, no heparin due to hx of chronic thrombocytopenia  Meds: venofer 50/week Mircera 75 q 4 weeks - last dose 3/19 and calcitriol 0.25 TIW hgb 10.4 3/12  Assessment/Plan: 1. Afib with RVR - rate controlled with cardizem drip and now on heparin IV- cards following  2. ESRD  -  TTS - HD today K 5.1  - catheter had been problematic at outpatient HD with BFR in the low 200s - using tight heparin now plus pt on a heparin drip due to afib- AVF placed 09/18/18-  I think we may be able to use 1 needle in it Thursday.  Elective f/u appt with [facility] was cancelled to to COVID precautions. 3. Hypertension/volume  - pre HD standing wt 95.7 - titrate EDW down while here  4. Anemia  - no indication for ESA at present hgb 10.8 consistent with last outpatient hgb 5. Metabolic bone disease -  Continue VDRA/TUMs  6. Nutrition - renal /carb mod diet/npo for test 7.   Hx of chronic thrombocytopenia likely familial had as teenager with prior neg BMBx - with neg HIT- had been in the 70s in January at outpatient unit, 70s during hospitalization here 101 3/02 at dialysis and now remarkably at 214 > 197 - previously on no heparin because of this. Need to watch for relapse 8.   COPD - per primary 9.   DM - per primary   Myriam Jacobson, PA-C [place] [phone] 12/08/2018, 10:49 AM    I have seen and examined this patient and agree with plan and assessment in the above note with renal recommendations/intervention highlighted.  AVF appears small in caliber.  Will ask [facility] to evaluate and may attempt one needle on Thursday. Governor Rooks Yatziry Deakins,MD 12/08/2018 12:25 PM

## 2018-12-08 NOTE — Progress Notes (Signed)
ABI's have been completed. Preliminary results can be found in CV Proc through chart review.   12/08/18 3:39 PM Larry Klein RVT

## 2018-12-08 NOTE — Progress Notes (Signed)
ANTICOAGULATION CONSULT NOTE -Follow up  Pharmacy Consult for heparin Indication: atrial fibrillation  No Known Allergies  Patient Measurements: Height: 5\' 11"  (180.3 cm) Weight: 206 lb 12.7 oz (93.8 kg) IBW/kg (Calculated) : 75.3 Heparin Dosing Weight: 94.5 kg  Vital Signs: Temp: 97.7 F (36.5 C) (03/24 1955) Temp Source: Oral (03/24 1955) BP: 115/78 (03/24 1955) Pulse Rate: 102 (03/24 1955)  Labs: Recent Labs    12/07/18 2237 12/08/18 0316 12/08/18 0727 12/08/18 1458 12/08/18 2241  HGB 11.4* 10.8*  --   --   --   HCT 35.4* 33.2*  --   --   --   PLT 214 197  --   --   --   HEPARINUNFRC  --   --  0.24* 0.69 0.20*  CREATININE 8.14* 8.58*  --   --   --   TROPONINI 0.21* 0.20*  --  0.22*  --     Estimated Creatinine Clearance: 10.2 mL/min (A) (by C-G formula based on SCr of 8.58 mg/dL (H)).   Medical History: Past Medical History:  Diagnosis Date  . COPD (chronic obstructive pulmonary disease) ([place])   . Coronary artery disease   . Dyspnea   . High cholesterol   . Hypertension   . Renal disorder       Assessment: 63 yo male with afib, started IV heparin therapy.  He has h/o thrombocytopenia.  He was not on anticoagulation PTA.  We are dosing conservatively initially due to low PTLC.   Heparin level 0.20 units/ml  PTLC 197  No bleeding noted  Goal of Therapy:  Heparin level 0.3-0.7 units/ml Monitor platelets by anticoagulation protocol: Yes   Plan:  Increase heparin drip to 1500 units/hr Check heparin level in 8 nhours Daily HL and CBC while on heparin Monitor for bleeding complications  Thanks for allowing pharmacy to be a part of this patient's care.  Excell Seltzer, PharmD Clinical Pharmacist 12/08/2018,11:44 PM

## 2018-12-08 NOTE — Progress Notes (Signed)
[place] TEAM 1 - Stepdown/ICU TEAM  Larry Klein  NWG:[id number] DOB: June 01, 1956 DOA: 12/07/2018 PCP: Patient, No Pcp Per    Brief Narrative:  63yo w/ a hx of CAD status post CABG, COPD, ESRD on hemodialysis, and A. fib not on anticoagulation due to chronic thrombocytopenia who presented to the ER at [facility] with chest pain.   In the ED an EKG showed A. fib with RVR and a CXR showed persistent hazy densities in the right lower chest w/ minimal change from November 12, 2018.    Significant Events: 3/24 admit  3/24 TTE pending   Subjective: Pt is seen for a f/u exam.   Assessment & Plan:  A. fib with RVR  Suppressed TSH Check FT4 and FT3  Chest pain - CAD status post CABGx4 2017  ESRD on hemodialysis T/Th/Sat  Chronic thrombocytopenia  COPD   Anemia of CKD  DVT prophylaxis: IV heparin  Code Status: FULL CODE Family Communication: no family present at time of exam  Disposition Plan:   Consultants:  Cardiology Nephrology  VVS  Antimicrobials:  none  Objective: Blood pressure 116/71, pulse 86, temperature 97.6 F (36.4 C), temperature source Oral, resp. rate 18, height 5\' 11"  (1.803 m), weight 93.8 kg, SpO2 99 %.  Intake/Output Summary (Last 24 hours) at 12/08/2018 1626 Last data filed at 12/08/2018 1323 Gross per 24 hour  Intake 581.2 ml  Output 2150 ml  Net -1568.8 ml   Filed Weights   12/08/18 0655 12/08/18 0915 12/08/18 1323  Weight: 95.7 kg 95.7 kg 93.8 kg    Examination: Follow up exam completed  CBC: Recent Labs  Lab 12/07/18 2237 12/08/18 0316  WBC 12.0* 11.9*  NEUTROABS 10.4*  --   HGB 11.4* 10.8*  HCT 35.4* 33.2*  MCV 90.1 89.0  PLT 214 578   Basic Metabolic Panel: Recent Labs  Lab 12/07/18 2237 12/08/18 0316  NA 137 139  K 5.2* 5.1  CL 103 102  CO2 16* 15*  GLUCOSE 122* 144*  BUN 91* 97*  CREATININE 8.14* 8.58*  CALCIUM 8.7* 8.6*   GFR: Estimated Creatinine Clearance: 10.2 mL/min (A) (by C-G formula based  on SCr of 8.58 mg/dL (H)).  Liver Function Tests: Recent Labs  Lab 12/07/18 2237  AST 39  ALT 77*  ALKPHOS 57  BILITOT 0.8  PROT 6.2*  ALBUMIN 3.7    Cardiac Enzymes: Recent Labs  Lab 12/07/18 2237 12/08/18 0316 12/08/18 1458  TROPONINI 0.21* 0.20* 0.22*    CBG: Recent Labs  Lab 12/08/18 1441  GLUCAP 91    Recent Results (from the past 240 hour(s))  MRSA PCR Screening     Status: None   Collection Time: 12/07/18  9:07 PM  Result Value Ref Range Status   MRSA by PCR NEGATIVE NEGATIVE Final    Comment:        The GeneXpert MRSA Assay (FDA approved for NASAL specimens only), is one component of a comprehensive MRSA colonization surveillance program. It is not intended to diagnose MRSA infection nor to guide or monitor treatment for MRSA infections. Performed at [facility], [place] [address] [place], [place] [address]      Scheduled Meds: . aspirin EC  81 mg Oral Daily  . atorvastatin  40 mg Oral q1800  . calcitRIOL  0.25 mcg Oral Q T,Th,Sa-HD  . calcium carbonate  2 tablet Oral TID AC  . Chlorhexidine Gluconate Cloth  6 each Topical Q0600  . diltiazem  120 mg Oral  QHS  . multivitamin  1 tablet Oral QHS  . umeclidinium-vilanterol  1 puff Inhalation Daily   Continuous Infusions: . heparin 1,300 Units/hr (12/08/18 1615)     LOS: 0 days   Cherene Altes, MD [facility] Office  [phone] Pager - Text Page per Amion  If 7PM-7AM, please contact night-coverage per Amion 12/08/2018, 4:26 PM

## 2018-12-09 LAB — COMPREHENSIVE METABOLIC PANEL
ALK PHOS: 67 U/L (ref 38–126)
ALT: 70 U/L — ABNORMAL HIGH (ref 0–44)
AST: 42 U/L — ABNORMAL HIGH (ref 15–41)
Albumin: 3.5 g/dL (ref 3.5–5.0)
Anion gap: 10 (ref 5–15)
BUN: 44 mg/dL — ABNORMAL HIGH (ref 8–23)
CO2: 24 mmol/L (ref 22–32)
Calcium: 8 mg/dL — ABNORMAL LOW (ref 8.9–10.3)
Chloride: 100 mmol/L (ref 98–111)
Creatinine, Ser: 5.14 mg/dL — ABNORMAL HIGH (ref 0.61–1.24)
GFR calc Af Amer: 13 mL/min — ABNORMAL LOW (ref >60)
GFR calc non Af Amer: 11 mL/min — ABNORMAL LOW (ref >60)
Glucose, Bld: 93 mg/dL (ref 70–99)
Potassium: 4.4 mmol/L (ref 3.5–5.1)
Sodium: 134 mmol/L — ABNORMAL LOW (ref 135–145)
Total Bilirubin: 1.3 mg/dL — ABNORMAL HIGH (ref 0.3–1.2)
Total Protein: 5.7 g/dL — ABNORMAL LOW (ref 6.5–8.1)

## 2018-12-09 LAB — CBC
HCT: 33.3 % — ABNORMAL LOW (ref 39.0–52.0)
HCT: 34.9 % — ABNORMAL LOW (ref 39.0–52.0)
Hemoglobin: 10.9 g/dL — ABNORMAL LOW (ref 13.0–17.0)
Hemoglobin: 11.1 g/dL — ABNORMAL LOW (ref 13.0–17.0)
MCH: 29.1 pg (ref 26.0–34.0)
MCH: 29.1 pg (ref 26.0–34.0)
MCHC: 32.7 g/dL (ref 30.0–36.0)
MCHC: 31.8 g/dL (ref 30.0–36.0)
MCV: 89 fL (ref 80.0–100.0)
MCV: 91.6 fL (ref 80.0–100.0)
Platelets: 158 10*3/uL (ref 150–400)
Platelets: 120 10*3/uL — ABNORMAL LOW (ref 150–400)
RBC: 3.74 MIL/uL — ABNORMAL LOW (ref 4.22–5.81)
RBC: 3.81 MIL/uL — ABNORMAL LOW (ref 4.22–5.81)
RDW: 18.8 % — AB (ref 11.5–15.5)
RDW: 19 % — ABNORMAL HIGH (ref 11.5–15.5)
WBC: 9.9 10*3/uL (ref 4.0–10.5)
WBC: 9.1 10*3/uL (ref 4.0–10.5)
nRBC: 1 % — ABNORMAL HIGH (ref 0.0–0.2)
nRBC: 0.7 % — ABNORMAL HIGH (ref 0.0–0.2)

## 2018-12-09 LAB — T4, FREE: Free T4: 0.99 ng/dL (ref 0.82–1.77)

## 2018-12-09 LAB — RENAL FUNCTION PANEL
Albumin: 3.4 g/dL — ABNORMAL LOW (ref 3.5–5.0)
Anion gap: 13 (ref 5–15)
BUN: 53 mg/dL — ABNORMAL HIGH (ref 8–23)
CO2: 22 mmol/L (ref 22–32)
Calcium: 8.3 mg/dL — ABNORMAL LOW (ref 8.9–10.3)
Chloride: 103 mmol/L (ref 98–111)
Creatinine, Ser: 5.74 mg/dL — ABNORMAL HIGH (ref 0.61–1.24)
GFR calc Af Amer: 11 mL/min — ABNORMAL LOW (ref >60)
GFR calc non Af Amer: 10 mL/min — ABNORMAL LOW (ref >60)
Glucose, Bld: 100 mg/dL — ABNORMAL HIGH (ref 70–99)
Phosphorus: 4.7 mg/dL — ABNORMAL HIGH (ref 2.5–4.6)
Potassium: 4.5 mmol/L (ref 3.5–5.1)
Sodium: 138 mmol/L (ref 135–145)

## 2018-12-09 LAB — MAGNESIUM: Magnesium: 1.9 mg/dL (ref 1.7–2.4)

## 2018-12-09 LAB — LIPID PANEL
Cholesterol: 125 mg/dL (ref 0–200)
HDL: 44 mg/dL (ref >40)
LDL Cholesterol: 63 mg/dL (ref 0–99)
Total CHOL/HDL Ratio: 2.8 RATIO
Triglycerides: 90 mg/dL (ref <150)
VLDL: 18 mg/dL (ref 0–40)

## 2018-12-09 LAB — HEMOGLOBIN A1C
Hgb A1c MFr Bld: 5.5 % (ref 4.8–5.6)
Mean Plasma Glucose: 111.15 mg/dL

## 2018-12-09 LAB — HEPARIN LEVEL (UNFRACTIONATED): Heparin Unfractionated: 0.28 IU/mL — ABNORMAL LOW (ref 0.30–0.70)

## 2018-12-09 MED ORDER — HEPARIN SODIUM (PORCINE) 1000 UNIT/ML DIALYSIS
1000.0000 [IU] | INTRAMUSCULAR | Status: DC | PRN
  Filled 2018-12-09: qty 1

## 2018-12-09 MED ORDER — LIDOCAINE-PRILOCAINE 2.5-2.5 % EX CREA: 1.0000 "application " | TOPICAL_CREAM | CUTANEOUS | Status: DC | PRN

## 2018-12-09 MED ORDER — HEPARIN SODIUM (PORCINE) 1000 UNIT/ML DIALYSIS
20.0000 [IU]/kg | INTRAMUSCULAR | Status: DC | PRN
  Administered 2018-12-10: 3900 [IU] via INTRAVENOUS_CENTRAL
  Filled 2018-12-09: qty 2

## 2018-12-09 MED ORDER — LIDOCAINE HCL (PF) 1 % IJ SOLN: 5.0000 mL | INTRAMUSCULAR | Status: DC | PRN

## 2018-12-09 MED ORDER — SODIUM CHLORIDE 0.9 % IV SOLN: 100.0000 mL | INTRAVENOUS | Status: DC | PRN

## 2018-12-09 MED ORDER — METOPROLOL TARTRATE 25 MG PO TABS
25.0000 mg | ORAL_TABLET | Freq: Two times a day (BID) | ORAL | Status: DC
  Administered 2018-12-09 (×2): 25 mg via ORAL
  Filled 2018-12-09 (×2): qty 1

## 2018-12-09 MED ORDER — PENTAFLUOROPROP-TETRAFLUOROETH EX AERO: 1.0000 "application " | INHALATION_SPRAY | CUTANEOUS | Status: DC | PRN

## 2018-12-09 MED ORDER — CHLORHEXIDINE GLUCONATE CLOTH 2 % EX PADS
6.0000 | MEDICATED_PAD | Freq: Every day | CUTANEOUS | Status: DC
  Administered 2018-12-09 – 2018-12-10 (×2): 6 via TOPICAL

## 2018-12-09 MED ORDER — DILTIAZEM HCL ER COATED BEADS 120 MG PO CP24
120.0000 mg | ORAL_CAPSULE | Freq: Every day | ORAL | Status: DC
  Filled 2018-12-09: qty 1

## 2018-12-09 MED ORDER — ALTEPLASE 2 MG IJ SOLR: 2.0000 mg | Freq: Once | INTRAMUSCULAR | Status: DC | PRN

## 2018-12-09 NOTE — Evaluation (Signed)
Physical Therapy Evaluation & Discharge Patient Details Name: Larry Klein MRN: [id number] DOB: May 29, 1956 Today's Date: 12/09/2018   History of Present Illness  Pt is a 63 y.o. male admitted 12/07/18 with chest pain. EKG showed afib with RVR; CXR showed persistent hazy densities in R lower chest with minimal change from imaging 11/12/18. Also with CHF. PMH includes CAD s/p CABG, ESRD (HD TTS), COPD, afib.     Clinical Impression  Patient evaluated by Physical Therapy with no further acute PT needs identified. PTA, pt indep and lives alone with son nearby. Today, pt indep with mobility. Educ on energy conservation strategies and importance of frequent mobility during hospital admission. All education has been completed and the patient has no further questions. Acute PT is signing off. Thank you for this referral.    Follow Up Recommendations No PT follow up    Equipment Recommendations  None recommended by PT    Recommendations for Other Services       Precautions / Restrictions Precautions Precautions: None Restrictions Weight Bearing Restrictions: No      Mobility  Bed Mobility Overal bed mobility: Independent                Transfers Overall transfer level: Independent                  Ambulation/Gait Ambulation/Gait assistance: Independent Gait Distance (Feet): 30 Feet Assistive device: None;IV Pole Gait Pattern/deviations: Step-through pattern;Decreased stride length Gait velocity: Decreased Gait velocity interpretation: 1.31 - 2.62 ft/sec, indicative of limited community ambulator General Gait Details: Pt indep with ambulation with and without pushing IV. Declined further distance due to fatigue  Stairs            Wheelchair Mobility    Modified Rankin (Stroke Patients Only)       Balance Overall balance assessment: Needs assistance   Sitting balance-Leahy Scale: Good       Standing balance-Leahy Scale: Good                                Pertinent Vitals/Pain Pain Assessment: No/denies pain    Home Living Family/patient expects to be discharged to:: Private residence Living Arrangements: Alone Available Help at Discharge: Family;Available PRN/intermittently Type of Home: Other(Comment)(lives in camper on son's property) Home Access: Stairs to enter Entrance Stairs-Rails: Right Entrance Stairs-Number of Steps: 2 Home Layout: One level Home Equipment: Walker - 2 wheels;Walker - 4 wheels;Cane - single point;Shower seat - built in Additional Comments: Drives to HD TTS    Prior Function Level of Independence: Independent         Comments: Drives to HD TTS. 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 does not wear special show insert     Hand Dominance        Extremity/Trunk Assessment   Upper Extremity Assessment Upper Extremity Assessment: Overall WFL for tasks assessed    Lower Extremity Assessment Lower Extremity Assessment: Overall WFL for tasks assessed       Communication   Communication: No difficulties  Cognition Arousal/Alertness: Awake/alert Behavior During Therapy: WFL for tasks assessed/performed Overall Cognitive Status: Within Functional Limits for tasks assessed                                        General Comments General comments (skin integrity, edema, etc.): Pt reports amb distance limited  due to fatigue/DOE. Educ on energy conservation strategies (handout provided)    Exercises     Assessment/Plan    PT Assessment Patent does not need any further PT services  PT Problem List         PT Treatment Interventions      PT Goals (Current goals can be found in the Care Plan section)  Acute Rehab PT Goals PT Goal Formulation: All assessment and education complete, DC therapy    Frequency     Barriers to discharge        Co-evaluation               AM-PAC PT "6 Clicks" Mobility  Outcome Measure Help needed turning from your back to  your side while in a flat bed without using bedrails?: None Help needed moving from lying on your back to sitting on the side of a flat bed without using bedrails?: None Help needed moving to and from a bed to a chair (including a wheelchair)?: None Help needed standing up from a chair using your arms (e.g., wheelchair or bedside chair)?: None Help needed to walk in hospital room?: None Help needed climbing 3-5 steps with a railing? : A Little 6 Click Score: 23    End of Session   Activity Tolerance: Patient tolerated treatment well;Patient limited by fatigue Patient left: in bed;with call bell/phone within reach Nurse Communication: Mobility status PT Visit Diagnosis: Other abnormalities of gait and mobility (R26.89)    Time: 4098-1191 PT Time Calculation (min) (ACUTE ONLY): 15 min   Charges:   PT Evaluation $PT Eval Low Complexity: [place], PT, DPT Acute Rehabilitation Services  Pager [phone] Office [place] 12/09/2018, 12:24 PM
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
## 2018-12-09 NOTE — Progress Notes (Signed)
ANTICOAGULATION CONSULT NOTE -Follow up  Pharmacy Consult for heparin Indication: atrial fibrillation  No Known Allergies  Patient Measurements: Height: 5\' 11"  (180.3 cm) Weight: 207 lb 0.2 oz (93.9 kg) IBW/kg (Calculated) : 75.3 Heparin Dosing Weight: 94.5 kg  Vital Signs: Temp: 98 F (36.7 C) (03/25 0605) Temp Source: Oral (03/25 0605) BP: 115/76 (03/25 0916) Pulse Rate: 92 (03/25 0916)  Labs: Recent Labs    12/07/18 2237 12/08/18 0316  12/08/18 1458 12/08/18 2241 12/09/18 0311 12/09/18 0817  HGB 11.4* 10.8*  --   --   --  10.9*  --   HCT 35.4* 33.2*  --   --   --  33.3*  --   PLT 214 197  --   --   --  158  --   HEPARINUNFRC  --   --    < > 0.69 0.20*  --  0.28*  CREATININE 8.14* 8.58*  --   --   --  5.14*  --   TROPONINI 0.21* 0.20*  --  0.22*  --   --   --    < > = values in this interval not displayed.    Estimated Creatinine Clearance: 17 mL/min (A) (by C-G formula based on SCr of 5.14 mg/dL (H)).   Medical History: Past Medical History:  Diagnosis Date  . COPD (chronic obstructive pulmonary disease) ([place])   . Coronary artery disease   . Dyspnea   . High cholesterol   . Hypertension   . Renal disorder       Assessment: 63 yo male with afib, started IV heparin therapy.  He has h/o thrombocytopenia.  He was not on anticoagulation PTA. Noted plans for consideration of upper arm fistula.  -heparin level= 0.28, hg= 10.9, plt= 158   Goal of Therapy:  Heparin level 0.3-0.7 units/ml Monitor platelets by anticoagulation protocol: Yes   Plan:  Increase heparin drip to 1600 units/hr Daily HL and CBC while on heparin  Hildred Laser, PharmD Clinical Pharmacist **Pharmacist phone directory can now be found on [email or web address] ([email or web address]).  Listed under [place].

## 2018-12-09 NOTE — Plan of Care (Signed)
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
## 2018-12-09 NOTE — TOC Initial Note (Addendum)
Transition of Care [facility]) - Initial/Assessment Note    Patient Details  Name: Larry Klein MRN: [id number] Date of Birth: [birth year]  Transition of Care [facility]) CM/SW Contact:    Bethena Roys, RN Phone Number: 12/09/2018, 1:59 PM  Clinical Narrative:  Pt presented for Atrial Fib RVR- Initiated on IV Heparin gtt. CM will monitor for oral anticoagulation needs. Pt resides in [place]. Hx ESRD on HD- (TTS schedule) states he has transportation. Patient uses [facility]. Patient states he gets medications without any problems. Has secondary Medicaid. CM did discuss PCP needs and patient states he will set up via insurance. CM will continue to monitor for any transition of care needs.                Expected Discharge Plan: Home/Self Care Barriers to Discharge: No Barriers Identified   Patient Goals and CMS Choice        Expected Discharge Plan and Services Expected Discharge Plan: Home/Self Care In-house Referral: NA Discharge Planning Services: CM Consult   Living arrangements for the past 2 months: Single Family Home Expected Discharge Date: 12/08/18                        Prior Living Arrangements/Services Living arrangements for the past 2 months: Single Family Home Lives with:: Self(Has children) Patient language and need for interpreter reviewed:: No        Need for Family Participation in Patient Care: No (Comment) Care giver support system in place?: No (comment) Current home services: DME(Pt has cane and RW) Criminal Activity/Legal Involvement Pertinent to Current Situation/Hospitalization: No - Comment as needed  Activities of Daily Living Home Assistive Devices/Equipment: Cane (specify quad or straight), Blood pressure cuff, Built-in shower seat ADL Screening (condition at time of admission) Patient's cognitive ability adequate to safely complete daily activities?: Yes Is the patient deaf or have difficulty  hearing?: No Does the patient have difficulty seeing, even when wearing glasses/contacts?: No Does the patient have difficulty concentrating, remembering, or making decisions?: No Patient able to express need for assistance with ADLs?: Yes Does the patient have difficulty dressing or bathing?: No Independently performs ADLs?: Yes (appropriate for developmental age) Does the patient have difficulty walking or climbing stairs?: Yes Weakness of Legs: None Weakness of Arms/Hands: None  Permission Sought/Granted                  Emotional Assessment Appearance:: Appears stated age   Affect (typically observed): Accepting Orientation: : Oriented to Self, Oriented to Place, Oriented to  Time, Oriented to Situation Alcohol / Substance Use: Not Applicable Psych Involvement: No (comment)  Admission diagnosis:  CHEST PAIN RENAL FAILURE Patient Active Problem List   Diagnosis Date Noted  . Chest pain 12/08/2018  . Atrial fibrillation with RVR ([place]) 12/07/2018  . Atrial fibrillation ([place]) 11/12/2018  . Thrombocytopenia ([place]) 09/20/2018  . ESRD (end stage renal disease) on dialysis ([place])   . Hepatomegaly   . Shortness of breath 09/17/2018   PCP:  Patient, No Pcp Per Pharmacy:   [facility] [place], [place] AT [address] [place] [address] Phone: [phone] Fax: [phone]     Social Determinants of Health (SDOH) Interventions    Readmission Risk Interventions No flowsheet data found.

## 2018-12-10 ENCOUNTER — Inpatient Hospital Stay (HOSPITAL_COMMUNITY): Payer: Medicare Other

## 2018-12-10 DIAGNOSIS — R079 Chest pain, unspecified: Secondary | ICD-10-CM

## 2018-12-10 LAB — CBC
HCT: 35.6 % — ABNORMAL LOW (ref 39.0–52.0)
Hemoglobin: 11 g/dL — ABNORMAL LOW (ref 13.0–17.0)
MCH: 27.9 pg (ref 26.0–34.0)
MCHC: 30.9 g/dL (ref 30.0–36.0)
MCV: 90.4 fL (ref 80.0–100.0)
Platelets: 90 10*3/uL — ABNORMAL LOW (ref 150–400)
RBC: 3.94 MIL/uL — ABNORMAL LOW (ref 4.22–5.81)
RDW: 18.8 % — ABNORMAL HIGH (ref 11.5–15.5)
WBC: 8.9 10*3/uL (ref 4.0–10.5)
nRBC: 0.6 % — ABNORMAL HIGH (ref 0.0–0.2)

## 2018-12-10 LAB — NM MYOCAR MULTI W/SPECT W/WALL MOTION / EF
Estimated workload: 1 METS
Exercise duration (min): 0 min
Exercise duration (sec): 0 s
MPHR: 156 {beats}/min
Peak HR: 117 {beats}/min
Percent HR: 75 %
Rest HR: 98 {beats}/min

## 2018-12-10 LAB — T3, FREE: T3, Free: 2.6 pg/mL (ref 2.0–4.4)

## 2018-12-10 LAB — HEPARIN LEVEL (UNFRACTIONATED)
Heparin Unfractionated: 0.26 IU/mL — ABNORMAL LOW (ref 0.30–0.70)
Heparin Unfractionated: 0.51 IU/mL (ref 0.30–0.70)

## 2018-12-10 LAB — PROTIME-INR
INR: 1.2 (ref 0.8–1.2)
Prothrombin Time: 15.4 seconds — ABNORMAL HIGH (ref 11.4–15.2)

## 2018-12-10 MED ORDER — REGADENOSON 0.4 MG/5ML IV SOLN
0.4000 mg | Freq: Once | INTRAVENOUS | Status: AC
  Administered 2018-12-10: 0.4 mg via INTRAVENOUS
  Filled 2018-12-10: qty 5

## 2018-12-10 MED ORDER — METOPROLOL TARTRATE 50 MG PO TABS
50.0000 mg | ORAL_TABLET | Freq: Two times a day (BID) | ORAL | Status: DC
  Administered 2018-12-10: 50 mg via ORAL
  Filled 2018-12-10: qty 1

## 2018-12-10 MED ORDER — TECHNETIUM TC 99M TETROFOSMIN IV KIT
30.0000 | PACK | Freq: Once | INTRAVENOUS | Status: AC | PRN
  Administered 2018-12-10: 30 via INTRAVENOUS

## 2018-12-10 MED ORDER — CALCITRIOL 0.25 MCG PO CAPS
ORAL_CAPSULE | ORAL | Status: AC
  Filled 2018-12-10: qty 1

## 2018-12-10 MED ORDER — HEPARIN SODIUM (PORCINE) 1000 UNIT/ML IJ SOLN
INTRAMUSCULAR | Status: AC
  Administered 2018-12-10: 3900 [IU] via INTRAVENOUS_CENTRAL
  Filled 2018-12-10: qty 4

## 2018-12-10 MED ORDER — TECHNETIUM TC 99M TETROFOSMIN IV KIT
10.0000 | PACK | Freq: Once | INTRAVENOUS | Status: AC | PRN
  Administered 2018-12-10: 10 via INTRAVENOUS

## 2018-12-10 MED ORDER — REGADENOSON 0.4 MG/5ML IV SOLN
INTRAVENOUS | Status: AC
  Filled 2018-12-10: qty 5

## 2018-12-10 NOTE — Progress Notes (Signed)
   Patient presented for a nuclear stress test today. No immediate complications. Stress imaging is pending at this time.   Preliminary EKG findings may be listed in the chart, but the stress test result will not be finalized until perfusion imaging is complete.  Darreld Mclean, PA-C 12/10/2018 9:31 AM
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
## 2018-12-10 NOTE — Procedures (Signed)
I was present at this session.  I have reviewed the session itself and made appropriate changes.  HD via cath. Flow 35-0,. bp 120s,  tol well.  Access prss ok.   Larry Klein 3/26/20202:35 PM

## 2018-12-10 NOTE — Progress Notes (Signed)
ANTICOAGULATION CONSULT NOTE - Follow Up Consult  Pharmacy Consult for Heparin Indication: CP and afib  No Known Allergies  Patient Measurements: Height: 5\' 11"  (180.3 cm) Weight: 211 lb 3.2 oz (95.8 kg) IBW/kg (Calculated) : 75.3 Heparin Dosing Weight:   Vital Signs: Temp: 97.7 F (36.5 C) (03/26 1249) Temp Source: Oral (03/26 1249) BP: 160/85 (03/26 1630) Pulse Rate: 92 (03/26 1658)  Labs: Recent Labs    12/07/18 2237 12/08/18 0316  12/08/18 1458  12/09/18 0311 12/09/18 0817 12/09/18 1632 12/10/18 0246 12/10/18 1600  HGB 11.4* 10.8*  --   --   --  10.9*  --  11.1* 11.0*  --   HCT 35.4* 33.2*  --   --   --  33.3*  --  34.9* 35.6*  --   PLT 214 197  --   --   --  158  --  120* 90*  --   LABPROT  --   --   --   --   --   --   --   --  15.4*  --   INR  --   --   --   --   --   --   --   --  1.2  --   HEPARINUNFRC  --   --    < > 0.69   < >  --  0.28*  --  0.26* 0.51  CREATININE 8.14* 8.58*  --   --   --  5.14*  --  5.74*  --   --   TROPONINI 0.21* 0.20*  --  0.22*  --   --   --   --   --   --    < > = values in this interval not displayed.    Estimated Creatinine Clearance: 15.4 mL/min (A) (by C-G formula based on SCr of 5.74 mg/dL (H)).  Assessment: Anticoag:  CP,  afib with RVR. Marland Kitchen  Not on [facility] PTA due to chronic thrombocytopenia. 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  Goal of Therapy:  Heparin level 0.3-0.7 units/ml Monitor platelets by anticoagulation protocol: Yes   Plan:  -Continue heparin at 1800 units/hr -Heparin daily wth CBC daily  Sharene Krikorian S. Alford Highland, PharmD, [place] Clinical Staff Pharmacist Eilene Ghazi Stillinger 12/10/2018,5:03 PM
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
## 2018-12-10 NOTE — Progress Notes (Signed)
ANTICOAGULATION CONSULT NOTE -Follow up  Pharmacy Consult for heparin Indication: atrial fibrillation  No Known Allergies  Patient Measurements: Height: 5\' 11"  (180.3 cm) Weight: 209 lb 3.5 oz (94.9 kg) IBW/kg (Calculated) : 75.3 Heparin Dosing Weight: 94.5 kg  Vital Signs: Temp: 97.6 F (36.4 C) (03/26 0557) Temp Source: Oral (03/26 0557) BP: 138/79 (03/26 0557) Pulse Rate: 111 (03/26 0557)  Labs: Recent Labs    12/07/18 2237 12/08/18 0316  12/08/18 1458 12/08/18 2241 12/09/18 0311 12/09/18 0817 12/09/18 1632 12/10/18 0246  HGB 11.4* 10.8*  --   --   --  10.9*  --  11.1* 11.0*  HCT 35.4* 33.2*  --   --   --  33.3*  --  34.9* 35.6*  PLT 214 197  --   --   --  158  --  120* 90*  LABPROT  --   --   --   --   --   --   --   --  15.4*  INR  --   --   --   --   --   --   --   --  1.2  HEPARINUNFRC  --   --    < > 0.69 0.20*  --  0.28*  --  0.26*  CREATININE 8.14* 8.58*  --   --   --  5.14*  --  5.74*  --   TROPONINI 0.21* 0.20*  --  0.22*  --   --   --   --   --    < > = values in this interval not displayed.    Estimated Creatinine Clearance: 15.3 mL/min (A) (by C-G formula based on SCr of 5.74 mg/dL (H)).   Medical History: Past Medical History:  Diagnosis Date  . COPD (chronic obstructive pulmonary disease) ([place])   . Coronary artery disease   . Dyspnea   . High cholesterol   . Hypertension   . Renal disorder       Assessment: 63 yo male with afib, started IV heparin therapy.  He has h/o thrombocytopenia.  He was not on anticoagulation PTA. Noted plans for consideration of upper arm fistula. Also noted for nuclear study for risk stratification and coumadin later.  -heparin level= 0.26, hg= 10.9, plt= 158   Goal of Therapy:  Heparin level 0.3-0.7 units/ml Monitor platelets by anticoagulation protocol: Yes   Plan:  Increase heparin drip to 1800 units/hr Heparin level in 6 hours and daily wth CBC daily   Hildred Laser, PharmD Clinical  Pharmacist **Pharmacist phone directory can now be found on [email or web address] ([email or web address]).  Listed under [place].
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
## 2018-12-11 ENCOUNTER — Inpatient Hospital Stay (HOSPITAL_COMMUNITY): Payer: Medicare Other

## 2018-12-11 DIAGNOSIS — I5023 Acute on chronic systolic (congestive) heart failure: Secondary | ICD-10-CM | POA: Diagnosis present

## 2018-12-11 DIAGNOSIS — J441 Chronic obstructive pulmonary disease with (acute) exacerbation: Secondary | ICD-10-CM | POA: Diagnosis present

## 2018-12-11 LAB — CBC
HCT: 32.9 % — ABNORMAL LOW (ref 39.0–52.0)
Hemoglobin: 10.2 g/dL — ABNORMAL LOW (ref 13.0–17.0)
MCH: 27.9 pg (ref 26.0–34.0)
MCHC: 31 g/dL (ref 30.0–36.0)
MCV: 89.9 fL (ref 80.0–100.0)
Platelets: 52 10*3/uL — ABNORMAL LOW (ref 150–400)
RBC: 3.66 MIL/uL — AB (ref 4.22–5.81)
RDW: 18.8 % — ABNORMAL HIGH (ref 11.5–15.5)
WBC: 8.9 10*3/uL (ref 4.0–10.5)
nRBC: 0 % (ref 0.0–0.2)

## 2018-12-11 LAB — HEPARIN LEVEL (UNFRACTIONATED): Heparin Unfractionated: 0.41 IU/mL (ref 0.30–0.70)

## 2018-12-11 MED ORDER — DOXYCYCLINE HYCLATE 100 MG PO TABS
100.0000 mg | ORAL_TABLET | Freq: Two times a day (BID) | ORAL | Status: DC
  Administered 2018-12-11 – 2018-12-13 (×5): 100 mg via ORAL
  Filled 2018-12-11 (×5): qty 1

## 2018-12-11 MED ORDER — METOPROLOL TARTRATE 25 MG PO TABS
25.0000 mg | ORAL_TABLET | Freq: Two times a day (BID) | ORAL | Status: DC
  Administered 2018-12-11 – 2018-12-12 (×3): 25 mg via ORAL
  Filled 2018-12-11 (×3): qty 1

## 2018-12-11 MED ORDER — PREDNISONE 20 MG PO TABS
40.0000 mg | ORAL_TABLET | Freq: Every day | ORAL | Status: DC
  Administered 2018-12-11 – 2018-12-13 (×3): 40 mg via ORAL
  Filled 2018-12-11 (×3): qty 2

## 2018-12-11 MED ORDER — TRAMADOL HCL 50 MG PO TABS
25.0000 mg | ORAL_TABLET | Freq: Once | ORAL | Status: AC
  Administered 2018-12-11: 25 mg via ORAL
  Filled 2018-12-11: qty 1

## 2018-12-11 MED ORDER — IPRATROPIUM-ALBUTEROL 0.5-2.5 (3) MG/3ML IN SOLN
RESPIRATORY_TRACT | Status: AC
  Administered 2018-12-11: 3 mL
  Filled 2018-12-11: qty 3

## 2018-12-11 MED ORDER — PANTOPRAZOLE SODIUM 40 MG PO TBEC
40.0000 mg | DELAYED_RELEASE_TABLET | Freq: Every day | ORAL | Status: DC
  Administered 2018-12-11 – 2018-12-13 (×3): 40 mg via ORAL
  Filled 2018-12-11 (×3): qty 1

## 2018-12-11 MED ORDER — CHLORHEXIDINE GLUCONATE CLOTH 2 % EX PADS
6.0000 | MEDICATED_PAD | Freq: Every day | CUTANEOUS | Status: DC
  Administered 2018-12-11 – 2018-12-13 (×3): 6 via TOPICAL

## 2018-12-11 MED ORDER — IPRATROPIUM-ALBUTEROL 0.5-2.5 (3) MG/3ML IN SOLN
3.0000 mL | Freq: Four times a day (QID) | RESPIRATORY_TRACT | Status: DC
  Administered 2018-12-11 – 2018-12-12 (×5): 3 mL via RESPIRATORY_TRACT
  Filled 2018-12-11 (×5): qty 3

## 2018-12-11 MED ORDER — AMIODARONE HCL 200 MG PO TABS
400.0000 mg | ORAL_TABLET | Freq: Two times a day (BID) | ORAL | Status: DC
  Administered 2018-12-11 – 2018-12-13 (×5): 400 mg via ORAL
  Filled 2018-12-11 (×5): qty 2

## 2018-12-11 NOTE — Plan of Care (Signed)
Patient sat in chair to wash up this evening and shortness of breath noted per RN.  RN asked patient if he felt short of breath and patient replied yes.  RN encouraged patient to take frequent rest breaks during activity when he started to feel short of breath.  Patient stated understanding.
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
## 2018-12-11 NOTE — Progress Notes (Addendum)
[facility] Progress Note   Dialysis Orders: [facility]  TueThuSat, 4 hrs 0 min, 180NRe Optiflux, BFR 400, DFR Manual 800 mL/min, EDW 95.2 (kg), Dialysate 3.0 K, 2.25 Ca, 1.0 Mg, 100 Dextrose (N3231), Sodium 137 (mEq/L), Bicarb Setting: 35 (mEq/L), UFR Profile: None, Sodium Model: None, Access: CVCatheter-Tunneled, and left lower AVF, no heparin due to hx of chronic thrombocytopenia  Meds: venofer 50/week Mircera 75 q 4 weeks - last dose 3/19 and calcitriol 0.25 TIW hgb 10.4 3/12  Assessment/Plan: 1. Afib with RVR - rate controlled with cardizem drip and now on heparin IV- cards following , trop flat. Echo 20 - 25% mod to severe MVR- persistent chest tightness -  Myoview not suggestive of ischemia- MTP decreased due to lowish BP and amiodarone added for rate control -  2. ESRD -  TTS -  catheter has been problematic at outpatient HD with BFR in the low 200s - no heparin dialysis- AVF placed 09/18/18. Catheter functioned at 300 3/24.  Maturing AVF evaluated by Dr. Donzetta Matters 3/24  Recommends vein mapping and consideration of upper arm fistula when we are able to do elective surgery again. HD Saturday 3. Hypertension/volume  - net UF 2.2 L post wt 93.3 - MTP decreased by cards to 24 bid- he will have a lower edw for d/c 4. Anemia  - hgb 10.2 consistent with outpatient hgb 5. Metabolic bone disease -  Continue VDRA/TUMs  6. Nutrition - renal /carb mod diet/npo for test 7.   Hx of chronic thrombocytopenia likely familial had as teenager with prior neg BMBx - with neg HIT- had been in the 73s in January at outpatient unit, 70s during hospitalization here 101 3/02 at dialysis and now remarkably high at 214 > 197 > 158> 120 > 90> 52   Previously on no heparin because of thrombcytopenia - heparin stopped today- cards recommends hematology eval as an inpatient vs outpatient to see if anticoagulation is an option in order to cardiovert in the future - continue no heparin HD  8.   COPD - per primary;  may be part of his peristant SOB 9.   DM - per primary   Myriam Jacobson, PA-C [place] [phone] 12/11/2018,8:10 AM  LOS: 3 days   Subjective:  MIn cramping yesterday on HD CXR clear today exc small right pleural effusion. Repeated due to SOB.  Objective Vitals:   12/10/18 1658 12/10/18 1701 12/10/18 2158 12/11/18 0600  BP: 118/64 130/82 126/88 123/85  Pulse: 92 97 96 91  Resp: (!) 22 (!) 22  18  Temp: (!) 97.5 F (36.4 C) 97.9 F (36.6 C) 98.4 F (36.9 C) 98 F (36.7 C)  TempSrc: Oral Oral Oral Oral  SpO2:  100% 98% 98%  Weight: 93.3 kg   93.4 kg  Height:       Physical Exam General: NAD sitting on side of bed breathing easily on room air, talkative Heart: irreg Lungs: poor expansion Abdomen: obese soft NT  Extremities: left transmet - + LE edema RLE slight edema Dialysis Access:  Left lower AVF + bruit, left IJ [facility]   Additional Objective Labs: Basic Metabolic Panel: Recent Labs  Lab 12/08/18 0316 12/09/18 0311 12/09/18 1632  NA 139 134* 138  K 5.1 4.4 4.5  CL 102 100 103  CO2 15* 24 22  GLUCOSE 144* 93 100*  BUN 97* 44* 53*  CREATININE 8.58* 5.14* 5.74*  CALCIUM 8.6* 8.0* 8.3*  PHOS  --   --  4.7*   Liver Function Tests: Recent Labs  Lab 12/07/18 2237 12/09/18 0311 12/09/18 1632  AST 39 42*  --   ALT 77* 70*  --   ALKPHOS 57 67  --   BILITOT 0.8 1.3*  --   PROT 6.2* 5.7*  --   ALBUMIN 3.7 3.5 3.4*   CBC: Recent Labs  Lab 12/07/18 2237 12/08/18 0316 12/09/18 0311 12/09/18 1632 12/10/18 0246 12/11/18 0258  WBC 12.0* 11.9* 9.9 9.1 8.9 8.9  NEUTROABS 10.4*  --   --   --   --   --   HGB 11.4* 10.8* 10.9* 11.1* 11.0* 10.2*  HCT 35.4* 33.2* 33.3* 34.9* 35.6* 32.9*  MCV 90.1 89.0 89.0 91.6 90.4 89.9  PLT 214 197 158 120* 90* 52*    Cardiac Enzymes: Recent Labs  Lab 12/07/18 2237 12/08/18 0316 12/08/18 1458  TROPONINI 0.21* 0.20* 0.22*   CBG: Recent Labs  Lab 12/08/18 1441  GLUCAP 91   Lab Results   Component Value Date   INR 1.2 12/10/2018   INR 1.18 09/18/2018   Studies/Results: Nm Myocar Multi W/spect W/wall Motion / Ef  Result Date: 12/10/2018  No T wave inversion was noted during stress.  Defect 1: There is a medium defect of moderate severity present in the basal inferior and basal inferolateral location.  Defect 2: There is a small defect of moderate severity present in the apex location.  The left ventricular ejection fraction is severely decreased (<30%).  Findings consistent with ischemia and prior myocardial infarction.  This is a high risk study.  High risk pharmacological nuclear perfusion study due to severely depressed left ventricular systolic function. Perfusion images suggest a moderate-size inferobasal scar and a small area of apical ischemia, which is largely reversible. The degree of ventricular dysfunction is out of proportion to the area of the perfusion abnormalities, suggesting mixed etiology for the cardiomyopathy.   Medications: . heparin 1,800 Units/hr (12/11/18 0655)   . amiodarone  400 mg Oral BID  . aspirin EC  81 mg Oral Daily  . atorvastatin  40 mg Oral q1800  . calcitRIOL  0.25 mcg Oral Q T,Th,Sa-HD  . calcium carbonate  2 tablet Oral TID AC  . Chlorhexidine Gluconate Cloth  6 each Topical Q0600  . metoprolol tartrate  25 mg Oral BID  . multivitamin  1 tablet Oral QHS  . umeclidinium-vilanterol  1 puff Inhalation Daily     I have seen and examined this patient and agree with plan and assessment in the above note with renal recommendations/intervention highlighted.  Await recommendations from cardiology. Broadus John A Genita Nilsson,MD 12/11/2018 10:39 AM

## 2018-12-11 NOTE — Progress Notes (Addendum)
Pharmacy Heparin Induced Thrombocytopenia (HIT) Note:  Larry Klein is an 63 y.o. male being evaluated for HIT. Heparin was started (for afib) on 3/23 then was stopped on 3/27. She also gets heparin with HD. Baseline platelets were 214 on 3/23. Of note platelet count was 30-76 in 10/2018.  -HIT labs were ordered on 3/27 when platelets dropped to 52. -Percent drop > 50% however platelet fall began 3/25 which is an early fall for HIT  Auto-populate labs:  Heparin Induced Plt Ab  Date/Time Value Ref Range Status  09/21/2018 01:01 PM 0.225 0.000 - 0.400 OD Final    Comment:    (NOTE) Performed At: [facility] [place], [place] [id number] Rush Farmer MD EO:[id number]       CALCULATE SCORE:  4Ts (see the HIT Algorithm) Score  Thrombocytopenia 2  Timing 2  Thrombosis 0  Other causes of thrombocytopenia 0  Total 4     Recommendations (A or B or C) are based on available lab results:  A. No lab results available (HIT antibody and/or SRA ordered): -Moderate HIT probability: Discontinue heparin/LMWH; HIT antibody recommended; SRA may be considered, consider alternative anticoagulation; document heparin allergy  B. HIT antibody result available: -N/A - HIT antibody not available, and/or SRA not available  C. SRA result available -SRA not available    Plan  Labs ordered: - The HIT antibody and SRA have already been collected Heparin allergy: -documented or updated Anticoagulation plans - no alternative anticoagulation suggested   Comments (List any alternative plans or if there are contraindications to therapy)  Hildred Laser, PharmD Clinical Pharmacist **Pharmacist phone directory can now be found on [email or web address] ([email or web address]).  Listed under [place].

## 2018-12-11 NOTE — Progress Notes (Signed)
PT Cancellation Note  Patient Details Name: Larry Klein MRN: [id number] DOB: [birth year]   Cancelled Treatment:    Reason Eval/Treat Not Completed: Other (comment)(Pt seen by PT on 3/25 and had no needs.  )Per nurse, pt has been saturating 98 % on RA.  Does get SOB with activity but apparently not desaturating and PT taught energy conservation techniques.  Please reconsult if any other needs arise.  Thanks.    Denice Paradise 12/11/2018, 12:21 PM  Tuwanna Krausz,PT Acute Rehabilitation Services Pager:  [phone]  Office:  [phone]

## 2018-12-11 NOTE — Progress Notes (Signed)
Patient complaining of 5-6/10 generalized pain requesting 25mg  PO Tramadol, RN text paged [facility] with this information.

## 2018-12-11 NOTE — Plan of Care (Signed)
  Problem: Education: Goal: Knowledge of disease or condition will improve Outcome: Progressing Goal: Understanding of medication regimen will improve Outcome: Progressing   Problem: Cardiac: Goal: Ability to achieve and maintain adequate cardiopulmonary perfusion will improve Outcome: Progressing   Problem: Health Behavior/Discharge Planning: Goal: Ability to manage health-related needs will improve Outcome: Progressing   Problem: Clinical Measurements: Goal: Ability to maintain clinical measurements within normal limits will improve Outcome: Progressing Goal: Diagnostic test results will improve Outcome: Progressing   Problem: Clinical Measurements: Goal: Respiratory complications will improve Outcome: Completed/Met   Problem: Coping: Goal: Level of anxiety will decrease Outcome: Completed/Met

## 2018-12-11 NOTE — Progress Notes (Signed)
Patient ambulated 150 feet. His sat were starting out 98% on RA. Although he got short of breath, his oxygen sats remained between 98-100% on room air.

## 2018-12-11 NOTE — Care Management Important Message (Signed)
Important Message  Patient Details  Name: Larry Klein MRN: [id number] Date of Birth: 29-Jan-1956   Medicare Important Message Given:  Yes    Jaedan Schuman Montine Circle 12/11/2018, 4:06 PM

## 2018-12-11 NOTE — Progress Notes (Signed)
Progress Note  Patient Name: Larry Klein Date of Encounter: 12/11/2018  Primary Cardiologist: Jenne Campus, MD   Subjective   Still dyspneic; no CP  Inpatient Medications    Scheduled Meds: . aspirin EC  81 mg Oral Daily  . atorvastatin  40 mg Oral q1800  . calcitRIOL  0.25 mcg Oral Q T,Th,Sa-HD  . calcium carbonate  2 tablet Oral TID AC  . Chlorhexidine Gluconate Cloth  6 each Topical Q0600  . metoprolol tartrate  50 mg Oral BID  . multivitamin  1 tablet Oral QHS  . umeclidinium-vilanterol  1 puff Inhalation Daily   Continuous Infusions: . heparin 1,800 Units/hr (12/11/18 0655)   PRN Meds: acetaminophen, ipratropium-albuterol   Vital Signs    Vitals:   12/10/18 1658 12/10/18 1701 12/10/18 2158 12/11/18 0600  BP: 118/64 130/82 126/88 123/85  Pulse: 92 97 96 91  Resp: (!) 22 (!) 22  18  Temp: (!) 97.5 F (36.4 C) 97.9 F (36.6 C) 98.4 F (36.9 C) 98 F (36.7 C)  TempSrc: Oral Oral Oral Oral  SpO2:  100% 98% 98%  Weight: 93.3 kg   93.4 kg  Height:        Intake/Output Summary (Last 24 hours) at 12/11/2018 0749 Last data filed at 12/11/2018 0655 Gross per 24 hour  Intake 1202.29 ml  Output 2200 ml  Net -997.71 ml   Last 3 Weights 12/11/2018 12/10/2018 12/10/2018  Weight (lbs) 205 lb 14.6 oz 205 lb 11 oz 211 lb 3.2 oz  Weight (kg) 93.4 kg 93.3 kg 95.8 kg      Telemetry    Atrial fibrillation rate upper normal to mildly elevated- Personally Reviewed   Physical Exam   GEN: No acute distress.   Neck: No JVD Cardiac: irregular Respiratory: Clear to auscultation bilaterally. GI: Soft, nontender, non-distended  MS: trace edema Neuro:  Nonfocal  Psych: Normal affect   Labs    Chemistry Recent Labs  Lab 12/07/18 2237 12/08/18 0316 12/09/18 0311 12/09/18 1632  NA 137 139 134* 138  K 5.2* 5.1 4.4 4.5  CL 103 102 100 103  CO2 16* 15* 24 22  GLUCOSE 122* 144* 93 100*  BUN 91* 97* 44* 53*  CREATININE 8.14* 8.58* 5.14* 5.74*  CALCIUM 8.7*  8.6* 8.0* 8.3*  PROT 6.2*  --  5.7*  --   ALBUMIN 3.7  --  3.5 3.4*  AST 39  --  42*  --   ALT 77*  --  70*  --   ALKPHOS 57  --  67  --   BILITOT 0.8  --  1.3*  --   GFRNONAA 6* 6* 11* 10*  GFRAA 7* 7* 13* 11*  ANIONGAP 18* 22* 10 13     Hematology Recent Labs  Lab 12/09/18 1632 12/10/18 0246 12/11/18 0258  WBC 9.1 8.9 8.9  RBC 3.81* 3.94* 3.66*  HGB 11.1* 11.0* 10.2*  HCT 34.9* 35.6* 32.9*  MCV 91.6 90.4 89.9  MCH 29.1 27.9 27.9  MCHC 31.8 30.9 31.0  RDW 19.0* 18.8* 18.8*  PLT 120* 90* 52*    Cardiac Enzymes Recent Labs  Lab 12/07/18 2237 12/08/18 0316 12/08/18 1458  TROPONINI 0.21* 0.20* 0.22*    BNP Recent Labs  Lab 12/07/18 2237  BNP 1,637.3*     Radiology    Nm Myocar Multi W/spect W/wall Motion / Ef  Result Date: 12/10/2018  No T wave inversion was noted during stress.  Defect 1: There is a medium defect of  moderate severity present in the basal inferior and basal inferolateral location.  Defect 2: There is a small defect of moderate severity present in the apex location.  The left ventricular ejection fraction is severely decreased (<30%).  Findings consistent with ischemia and prior myocardial infarction.  This is a high risk study.  High risk pharmacological nuclear perfusion study due to severely depressed left ventricular systolic function. Perfusion images suggest a moderate-size inferobasal scar and a small area of apical ischemia, which is largely reversible. The degree of ventricular dysfunction is out of proportion to the area of the perfusion abnormalities, suggesting mixed etiology for the cardiomyopathy.    Patient Profile     Larry Klein is a 63 y.o. male with a history of CAD s/p CABGx4 in 2017 in [place], [place], ESRD on hemodialysis M/W/F, atrial fibrillation not on anticoagulation secondary to chronic thrombocytopenia,hypertension, hyperlipidemia, COPD, who is being seen today for the evaluation ofchest painand dyspnea.   Echocardiogram this admission shows ejection fraction 20 to 25%, moderate biatrial enlargement and moderate to severe mitral regurgitation.  Nuclear study shows inferobasal scar and mild apical ischemia.  Assessment & Plan    1 cardiomyopathy-as outlined in previous notes there is likely an ischemic component of his cardiomyopathy as based on his report his ejection fraction was in the 40s at time of bypass in [place].  However LV function is worse.  Nuclear study does not suggest significant ischemia as cause.  There may be a component of tachycardia mediated cardiomyopathy from uncontrolled atrial fibrillation.  His heart rate is high normal today and he states his systolic blood pressure was in the 90s postdialysis yesterday and he felt weak.  I will decrease metoprolol to 25 mg twice daily.  Once dose is established we will transition to Toprol.  Can consider addition of ARB later if blood pressure allows.  Would then need to reassess LV function in 3 months.  If ejection fraction less than 35% would consider ICD.  2 persistent atrial fibrillation-duration of his atrial fibrillation is unknown.  Elevated rate could be contributing to his cardiomyopathy.  He did not tolerate higher dose metoprolol as his blood pressure dropped into the 90s following dialysis and he was symptomatic.  Decrease metoprolol to 25 mg twice daily.  Add amiodarone 400 mg twice daily to assist with rate control.  Continue for 2 weeks and then decrease to 200 mg daily.  Ideally I would like to anticoagulate for 3 weeks and then attempt cardioversion.  However he has a long history of intermittent thrombocytopenia with unclear evaluation in the past.  His platelet count has decreased to 52,000.  Discontinue heparin.  He will need hematology evaluation as an outpatient.  Once evaluation complete could anticoagulate at that point if okay with hematology.    3 mitral regurgitation-I personally reviewed the patient's echocardiogram  and mitral regurgitation appears to be moderate.  He will need follow-up echocardiogram in the future.    4 end-stage renal disease-dialysis per nephrology.  5 thrombocytopenia-we will need hematology evaluation either as outpatient or inpatient.  6 upper respiratory infection-management per primary care.  For questions or updates, please contact [place] Please consult [email or web address] for contact info under        Signed, Kirk Ruths, MD  12/11/2018, 7:49 AM

## 2018-12-12 DIAGNOSIS — J441 Chronic obstructive pulmonary disease with (acute) exacerbation: Secondary | ICD-10-CM

## 2018-12-12 DIAGNOSIS — I429 Cardiomyopathy, unspecified: Secondary | ICD-10-CM

## 2018-12-12 DIAGNOSIS — R0600 Dyspnea, unspecified: Secondary | ICD-10-CM

## 2018-12-12 DIAGNOSIS — Z992 Dependence on renal dialysis: Secondary | ICD-10-CM

## 2018-12-12 DIAGNOSIS — N186 End stage renal disease: Secondary | ICD-10-CM

## 2018-12-12 DIAGNOSIS — I519 Heart disease, unspecified: Secondary | ICD-10-CM

## 2018-12-12 DIAGNOSIS — D696 Thrombocytopenia, unspecified: Secondary | ICD-10-CM

## 2018-12-12 DIAGNOSIS — I5022 Chronic systolic (congestive) heart failure: Secondary | ICD-10-CM

## 2018-12-12 DIAGNOSIS — R079 Chest pain, unspecified: Secondary | ICD-10-CM

## 2018-12-12 LAB — CBC
HCT: 34.5 % — ABNORMAL LOW (ref 39.0–52.0)
HCT: 34 % — ABNORMAL LOW (ref 39.0–52.0)
Hemoglobin: 10.8 g/dL — ABNORMAL LOW (ref 13.0–17.0)
Hemoglobin: 10.3 g/dL — ABNORMAL LOW (ref 13.0–17.0)
MCH: 28.2 pg (ref 26.0–34.0)
MCH: 27.9 pg (ref 26.0–34.0)
MCHC: 31.3 g/dL (ref 30.0–36.0)
MCHC: 30.3 g/dL (ref 30.0–36.0)
MCV: 90.1 fL (ref 80.0–100.0)
MCV: 92.1 fL (ref 80.0–100.0)
Platelets: 40 10*3/uL — ABNORMAL LOW (ref 150–400)
Platelets: 41 10*3/uL — ABNORMAL LOW (ref 150–400)
RBC: 3.83 MIL/uL — ABNORMAL LOW (ref 4.22–5.81)
RBC: 3.69 MIL/uL — ABNORMAL LOW (ref 4.22–5.81)
RDW: 18.9 % — ABNORMAL HIGH (ref 11.5–15.5)
RDW: 18.8 % — ABNORMAL HIGH (ref 11.5–15.5)
WBC: 10 10*3/uL (ref 4.0–10.5)
WBC: 11.2 10*3/uL — ABNORMAL HIGH (ref 4.0–10.5)
nRBC: 0.4 % — ABNORMAL HIGH (ref 0.0–0.2)
nRBC: 0.4 % — ABNORMAL HIGH (ref 0.0–0.2)

## 2018-12-12 LAB — RENAL FUNCTION PANEL
Albumin: 3.3 g/dL — ABNORMAL LOW (ref 3.5–5.0)
Anion gap: 14 (ref 5–15)
BUN: 56 mg/dL — ABNORMAL HIGH (ref 8–23)
CO2: 23 mmol/L (ref 22–32)
Calcium: 8.4 mg/dL — ABNORMAL LOW (ref 8.9–10.3)
Chloride: 98 mmol/L (ref 98–111)
Creatinine, Ser: 5.86 mg/dL — ABNORMAL HIGH (ref 0.61–1.24)
GFR calc Af Amer: 11 mL/min — ABNORMAL LOW (ref >60)
GFR calc non Af Amer: 9 mL/min — ABNORMAL LOW (ref >60)
Glucose, Bld: 109 mg/dL — ABNORMAL HIGH (ref 70–99)
Phosphorus: 4.4 mg/dL (ref 2.5–4.6)
Potassium: 3.8 mmol/L (ref 3.5–5.1)
Sodium: 135 mmol/L (ref 135–145)

## 2018-12-12 LAB — HEPARIN INDUCED PLATELET AB (HIT ANTIBODY): Heparin Induced Plt Ab: 0.153 OD (ref 0.000–0.400)

## 2018-12-12 MED ORDER — PENTAFLUOROPROP-TETRAFLUOROETH EX AERO: 1.0000 "application " | INHALATION_SPRAY | CUTANEOUS | Status: DC | PRN

## 2018-12-12 MED ORDER — SODIUM CHLORIDE 0.9 % IV SOLN: 100.0000 mL | INTRAVENOUS | Status: DC | PRN

## 2018-12-12 MED ORDER — LIDOCAINE HCL (PF) 1 % IJ SOLN: 5.0000 mL | INTRAMUSCULAR | Status: DC | PRN

## 2018-12-12 MED ORDER — ANTICOAGULANT SODIUM CITRATE 4% (200MG/5ML) IV SOLN
5.0000 mL | Status: DC | PRN
  Administered 2018-12-12: 5 mL via INTRAVENOUS_CENTRAL
  Filled 2018-12-12: qty 5

## 2018-12-12 MED ORDER — LIDOCAINE-PRILOCAINE 2.5-2.5 % EX CREA: 1.0000 "application " | TOPICAL_CREAM | CUTANEOUS | Status: DC | PRN

## 2018-12-12 MED ORDER — TRAMADOL HCL 50 MG PO TABS
25.0000 mg | ORAL_TABLET | Freq: Once | ORAL | Status: AC
  Administered 2018-12-12: 25 mg via ORAL
  Filled 2018-12-12: qty 1

## 2018-12-12 MED ORDER — HEPARIN SODIUM (PORCINE) 1000 UNIT/ML DIALYSIS: 1000.0000 [IU] | INTRAMUSCULAR | Status: DC | PRN

## 2018-12-12 MED ORDER — METOPROLOL SUCCINATE ER 50 MG PO TB24
50.0000 mg | ORAL_TABLET | Freq: Every day | ORAL | Status: DC
  Administered 2018-12-12 – 2018-12-13 (×2): 50 mg via ORAL
  Filled 2018-12-12 (×2): qty 1

## 2018-12-12 MED ORDER — CALCITRIOL 0.25 MCG PO CAPS
ORAL_CAPSULE | ORAL | Status: AC
  Filled 2018-12-12: qty 1

## 2018-12-12 MED ORDER — ALTEPLASE 2 MG IJ SOLR: 2.0000 mg | Freq: Once | INTRAMUSCULAR | Status: DC | PRN

## 2018-12-12 MED ORDER — ALBUTEROL SULFATE (2.5 MG/3ML) 0.083% IN NEBU
2.5000 mg | INHALATION_SOLUTION | RESPIRATORY_TRACT | Status: DC | PRN
  Administered 2018-12-12 – 2018-12-13 (×2): 2.5 mg via RESPIRATORY_TRACT
  Filled 2018-12-12 (×2): qty 3

## 2018-12-12 MED ORDER — IPRATROPIUM-ALBUTEROL 0.5-2.5 (3) MG/3ML IN SOLN
3.0000 mL | Freq: Two times a day (BID) | RESPIRATORY_TRACT | Status: DC
  Administered 2018-12-13: 3 mL via RESPIRATORY_TRACT
  Filled 2018-12-12: qty 3

## 2018-12-12 NOTE — Procedures (Signed)
I was present at this dialysis session. I have reviewed the session itself and made appropriate changes.   Vital signs in last 24 hours:  Temp:  [97.5 F (36.4 C)-98 F (36.7 C)] 97.6 F (36.4 C) (03/28 0643) Pulse Rate:  [64-100] 64 (03/28 0800) Resp:  [13-21] 13 (03/28 0800) BP: (100-147)/(57-92) 147/68 (03/28 0800) SpO2:  [97 %-100 %] 100 % (03/28 0643) Weight:  [94.3 kg-97.4 kg] 97.4 kg (03/28 0643) Weight change: -1.452 kg Filed Weights   12/11/18 0600 12/12/18 0551 12/12/18 0643  Weight: 93.4 kg 94.3 kg 97.4 kg    Recent Labs  Lab 12/12/18 0714  NA 135  K 3.8  CL 98  CO2 23  GLUCOSE 109*  BUN 56*  CREATININE 5.86*  CALCIUM 8.4*  PHOS 4.4    Recent Labs  Lab 12/07/18 2237  12/11/18 0258 12/12/18 0315 12/12/18 0714  WBC 12.0*   < > 8.9 10.0 11.2*  NEUTROABS 10.4*  --   --   --   --   HGB 11.4*   < > 10.2* 10.8* 10.3*  HCT 35.4*   < > 32.9* 34.5* 34.0*  MCV 90.1   < > 89.9 90.1 92.1  PLT 214   < > 52* 40* 41*   < > = values in this interval not displayed.    Scheduled Meds: . amiodarone  400 mg Oral BID  . aspirin EC  81 mg Oral Daily  . atorvastatin  40 mg Oral q1800  . calcitRIOL  0.25 mcg Oral Q T,Th,Sa-HD  . calcium carbonate  2 tablet Oral TID AC  . Chlorhexidine Gluconate Cloth  6 each Topical Q0600  . doxycycline  100 mg Oral Q12H  . ipratropium-albuterol  3 mL Nebulization QID  . metoprolol tartrate  25 mg Oral BID  . multivitamin  1 tablet Oral QHS  . pantoprazole  40 mg Oral Q0600  . predniSONE  40 mg Oral QAC breakfast  . umeclidinium-vilanterol  1 puff Inhalation Daily   Continuous Infusions: . sodium chloride    . sodium chloride    . anticoagulant sodium citrate     PRN Meds:.sodium chloride, sodium chloride, acetaminophen, alteplase, anticoagulant sodium citrate, heparin, lidocaine (PF), lidocaine-prilocaine, pentafluoroprop-tetrafluoroeth   [place] 4 hrs 0 min, 180NRe Optiflux, BFR 400, DFR Manual  800 mL/min, EDW 95.2 (kg), Dialysate 3.0 K, 2.25 Ca, 1.0 Mg, 100 Dextrose (N3231), Sodium 137 (mEq/L), Bicarb Setting: 35 (mEq/L), UFR Profile: None, Sodium Model: None, Access: CVCatheter-Tunneled, and left lower AVF, no heparin due to hx of chronic thrombocytopenia  Meds: venofer 50/week Mircera 75 q 4 weeks - last dose 3/19 and calcitriol 0.25 TIW hgb 10.4 3/12  Assessment/Plan: 1. Afib with RVR - rate controlled with cardizem drip and now on heparin IV- cards following, trop flat. Echo 20 - 25% mod to severe MVR- persistent chest tightness -  Myoview not suggestive of ischemia- MTP decreased due to lowish BP and amiodarone added for rate control -  2. ESRD- TTS -  catheter has been problematic at outpatient HD with BFR in the low 200s - no heparin dialysis- AVF placed 09/18/18. Catheter functioned at 300 3/24.  Maturing AVF evaluated by Dr. Donzetta Matters 3/24  Recommends vein mapping and consideration of upper arm fistula when we are able to do elective surgery again. HD Saturday 3. Hypertension/volume- net UF 2.2 L post wt 93.3 - MTP decreased by cards to 24 bid- he will have a lower edw for d/c 4. Anemia- hgb  10.2 consistent with outpatient hgb 5. Metabolic bone disease- Continue VDRA/TUMs 6. Nutrition- renal /carb mod diet/npo for test 7. Hx of chronic thrombocytopenialikely familial had as teenager with prior neg BMBx- with neg HIT- had been in the 70s in January at outpatient unit, 70s during hospitalization here 101 3/02 at dialysis and now remarkably high at 214 > 197 > 158> 120 > 90 > 52 > 41 Previously on no heparin because of thrombcytopenia - heparin stopped today- cards recommends hematology eval as an inpatient vs outpatient to see if anticoagulation is an option in order to cardiovert in the future - continue no heparin HD  8. COPD - per primary; may be part of his peristant SOB 9. DM- per primary   Donetta Potts,  MD 12/12/2018, 8:12 AM
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
## 2018-12-12 NOTE — Progress Notes (Signed)
Progress Note  Patient Name: Larry Klein Date of Encounter: 12/12/2018  Primary Cardiologist: Jenne Campus, MD   Subjective   Feeling fairly well overall. Denies chest pain at present. Breathing is ok.   Inpatient Medications    Scheduled Meds: . amiodarone  400 mg Oral BID  . aspirin EC  81 mg Oral Daily  . atorvastatin  40 mg Oral q1800  . calcitRIOL      . calcitRIOL  0.25 mcg Oral Q T,Th,Sa-HD  . calcium carbonate  2 tablet Oral TID AC  . Chlorhexidine Gluconate Cloth  6 each Topical Q0600  . doxycycline  100 mg Oral Q12H  . ipratropium-albuterol  3 mL Nebulization QID  . metoprolol tartrate  25 mg Oral BID  . multivitamin  1 tablet Oral QHS  . pantoprazole  40 mg Oral Q0600  . predniSONE  40 mg Oral QAC breakfast  . umeclidinium-vilanterol  1 puff Inhalation Daily   Continuous Infusions:  PRN Meds: acetaminophen   Vital Signs    Vitals:   12/12/18 1000 12/12/18 1030 12/12/18 1051 12/12/18 1142  BP: 128/67 (!) 109/57 122/72   Pulse: 80 66 96   Resp: 15 (!) 22 (!) 24   Temp:   97.7 F (36.5 C)   TempSrc:   Oral   SpO2:   98% 98%  Weight:   93.4 kg   Height:        Intake/Output Summary (Last 24 hours) at 12/12/2018 1220 Last data filed at 12/12/2018 1051 Gross per 24 hour  Intake 462 ml  Output 1200 ml  Net -738 ml   Filed Weights   12/12/18 0551 12/12/18 0643 12/12/18 1051  Weight: 94.3 kg 97.4 kg 93.4 kg    Telemetry    A fib 80-90 bpm - Personally Reviewed    Physical Exam   GEN: No acute distress.   Neck: No JVD Cardiac: Irregular, no murmurs, rubs, or gallops.  Respiratory: Clear to auscultation bilaterally. GI: Soft, nontender, non-distended  MS: Trace b/l LE edema Neuro:  Nonfocal  Psych: Normal affect   Labs    Chemistry Recent Labs  Lab 12/07/18 2237  12/09/18 0311 12/09/18 1632 12/12/18 0714  NA 137   < > 134* 138 135  K 5.2*   < > 4.4 4.5 3.8  CL 103   < > 100 103 98  CO2 16*   < > 24 22 23   GLUCOSE 122*    < > 93 100* 109*  BUN 91*   < > 44* 53* 56*  CREATININE 8.14*   < > 5.14* 5.74* 5.86*  CALCIUM 8.7*   < > 8.0* 8.3* 8.4*  PROT 6.2*  --  5.7*  --   --   ALBUMIN 3.7  --  3.5 3.4* 3.3*  AST 39  --  42*  --   --   ALT 77*  --  70*  --   --   ALKPHOS 57  --  67  --   --   BILITOT 0.8  --  1.3*  --   --   GFRNONAA 6*   < > 11* 10* 9*  GFRAA 7*   < > 13* 11* 11*  ANIONGAP 18*   < > 10 13 14    < > = values in this interval not displayed.     Hematology Recent Labs  Lab 12/11/18 0258 12/12/18 0315 12/12/18 0714  WBC 8.9 10.0 11.2*  RBC 3.66* 3.83* 3.69*  HGB 10.2*  10.8* 10.3*  HCT 32.9* 34.5* 34.0*  MCV 89.9 90.1 92.1  MCH 27.9 28.2 27.9  MCHC 31.0 31.3 30.3  RDW 18.8* 18.9* 18.8*  PLT 52* 40* 41*    Cardiac Enzymes Recent Labs  Lab 12/07/18 2237 12/08/18 0316 12/08/18 1458  TROPONINI 0.21* 0.20* 0.22*   No results for input(s): TROPIPOC in the last 168 hours.   BNP Recent Labs  Lab 12/07/18 2237  BNP 1,637.3*     DDimer No results for input(s): DDIMER in the last 168 hours.   Radiology    Dg Chest 2 View  Result Date: 12/11/2018 CLINICAL DATA:  Chest pain, dyspnea. EXAM: CHEST - 2 VIEW COMPARISON:  Radiographs of December 07, 2018. FINDINGS: Stable cardiomediastinal silhouette. Left internal jugular dialysis catheter is unchanged in position. Sternotomy wires are noted. No pneumothorax is noted. No consolidative process is noted. Minimal right pleural effusion is noted. Bony thorax is intact. IMPRESSION: Probable minimal right pleural effusion. No other acute pulmonary abnormality is noted. Electronically Signed   By: Marijo Conception, M.D.   On: 12/11/2018 09:08     Cardiac Studies    Echocardiogram 12/08/18:  IMPRESSIONS    1. The left ventricle has severely reduced systolic function, with an ejection fraction of 20-25%. The cavity size was moderately dilated. There is mildly increased left ventricular wall thickness. Left ventricular diastolic function  could not be  evaluated secondary to atrial fibrillation. Left ventricular diffuse hypokinesis.  2. The right ventricle has mildly reduced systolic function. The cavity was mildly enlarged. There is no increase in right ventricular wall thickness.  3. Left atrial size was moderately dilated.  4. Right atrial size was moderately dilated.  5. The mitral valve is degenerative. Moderate thickening of the mitral valve leaflet. Mitral valve regurgitation is moderate to severe by color flow Doppler. The MR jet is anteriorly-directed.  6. The tricuspid valve is grossly normal.  7. The aortic valve was not well visualized Moderate sclerosis of the aortic valve.  8. The inferior vena cava was dilated in size with <50% respiratory variability.  SUMMARY   LVEF 20-25%, moderately dilated LV, severe global hypokinesis, mildly reduced RV systolic function, moderate biatrial enlargment, moderate to severe eccentric MR, mild TR, aortic valve sclerosis, dilated IVC  Nm Myocar Multi W/spect W/wall Motion / Ef  Result Date: 12/10/2018  No T wave inversion was noted during stress.  Defect 1: There is a medium defect of moderate severity present in the basal inferior and basal inferolateral location.  Defect 2: There is a small defect of moderate severity present in the apex location.  The left ventricular ejection fraction is severely decreased (<30%).  Findings consistent with ischemia and prior myocardial infarction.  This is a high risk study.  High risk pharmacological nuclear perfusion study due to severely depressed left ventricular systolic function. Perfusion images suggest a moderate-size inferobasal scar and a small area of apical ischemia, which is largely reversible. The degree of ventricular dysfunction is out of proportion to the area of the perfusion abnormalities, suggesting mixed etiology for the cardiomyopathy.   Patient Profile     63 y.o. male with a history of CAD s/p CABGx4 in 2017  in [place], [place], ESRD on hemodialysis M/W/F, atrial fibrillation not on anticoagulation secondary to chronic thrombocytopenia,hypertension, hyperlipidemia, COPD, who is being seen today for the evaluation ofchest painand dyspnea.  Echocardiogram this admission shows ejection fraction 20 to 25%, moderate biatrial enlargement and moderate to severe mitral regurgitation.  Nuclear study  shows inferobasal scar and mild apical ischemia.  Assessment & Plan    1. Cardiomyopathy-as outlined in previous notes there is likely an ischemic component of his cardiomyopathy as based on his report his ejection fraction was in the 40s at time of bypass in [place].  However LV function is worse.  Nuclear study does not suggest significant ischemia as cause.  There may be a component of tachycardia mediated cardiomyopathy from uncontrolled atrial fibrillation.   HR 80-90 bpm range. Continue amiodarone. I will switch Lopressor to Toprol-XL 50 mg daily.   Would then need to reassess LV function in 3 months. If ejection fraction less than 35% would consider ICD.  2. Persistent atrial fibrillation-duration of his atrial fibrillation is unknown.  Elevated rate could be contributing to his cardiomyopathy.  He did not tolerate higher dose metoprolol as his blood pressure dropped into the 90s following dialysis and he was symptomatic.  I will switch Lopressor to Toprol-XL 50 mg daily.  Continue amiodarone 400 mg twice daily to assist with rate control.  Continue for 2 weeks and then decrease to 200 mg daily.  Ideally I would like to anticoagulate for 3 weeks and then attempt cardioversion.  However he has a long history of intermittent thrombocytopenia with unclear evaluation in the past.  His platelet count has decreased to 41,000.  No longer on heparin.  He will need hematology evaluation as an outpatient.  Once evaluation complete could anticoagulate at that point if okay with hematology.    3. Mitral  regurgitation-Dr. Stanford Breed reviewed the patient's echocardiogram and mitral regurgitation felt to be moderate.  He will need follow-up echocardiogram in the future.    4. End-stage renal disease-dialysis per nephrology.  5. Thrombocytopenia-we will need hematology evaluation either as outpatient or inpatient. 41k today.  6. Upper respiratory infection-management per primary care.  7. CAD: Continue ASA, atorvastatin, and beta blocker.   For questions or updates, please contact [facility] Please consult [email or web address] for contact info under Cardiology/STEMI.      Signed, Kate Sable, MD  12/12/2018, 12:20 PM

## 2018-12-13 DIAGNOSIS — I25708 Atherosclerosis of coronary artery bypass graft(s), unspecified, with other forms of angina pectoris: Secondary | ICD-10-CM

## 2018-12-13 LAB — CBC
HCT: 39 % (ref 39.0–52.0)
Hemoglobin: 11.9 g/dL — ABNORMAL LOW (ref 13.0–17.0)
MCH: 27.9 pg (ref 26.0–34.0)
MCHC: 30.5 g/dL (ref 30.0–36.0)
MCV: 91.5 fL (ref 80.0–100.0)
NRBC: 1.3 % — AB (ref 0.0–0.2)
Platelets: 41 10*3/uL — ABNORMAL LOW (ref 150–400)
RBC: 4.26 MIL/uL (ref 4.22–5.81)
RDW: 19.5 % — ABNORMAL HIGH (ref 11.5–15.5)
WBC: 14.6 10*3/uL — ABNORMAL HIGH (ref 4.0–10.5)

## 2018-12-13 MED ORDER — AMIODARONE HCL 200 MG PO TABS: ORAL_TABLET | ORAL | 3 refills | Status: DC

## 2018-12-13 MED ORDER — PANTOPRAZOLE SODIUM 40 MG PO TBEC: 40.0000 mg | DELAYED_RELEASE_TABLET | Freq: Every day | ORAL | 1 refills | Status: AC

## 2018-12-13 MED ORDER — RENA-VITE PO TABS: 1.0000 | ORAL_TABLET | Freq: Every day | ORAL | 3 refills | Status: DC

## 2018-12-13 MED ORDER — PREDNISONE 10 MG PO TABS: ORAL_TABLET | ORAL | 0 refills | Status: DC

## 2018-12-13 MED ORDER — UMECLIDINIUM-VILANTEROL 62.5-25 MCG/INH IN AEPB: 1.0000 | INHALATION_SPRAY | Freq: Every day | RESPIRATORY_TRACT | 2 refills | Status: AC

## 2018-12-13 MED ORDER — ATORVASTATIN CALCIUM 40 MG PO TABS: 40.0000 mg | ORAL_TABLET | Freq: Every day | ORAL | 3 refills | Status: AC

## 2018-12-13 MED ORDER — DOXYCYCLINE HYCLATE 100 MG PO TABS: 100.0000 mg | ORAL_TABLET | Freq: Two times a day (BID) | ORAL | 0 refills | Status: DC

## 2018-12-13 MED ORDER — DOXYCYCLINE HYCLATE 100 MG PO TABS: 100.0000 mg | ORAL_TABLET | Freq: Two times a day (BID) | ORAL | 0 refills | Status: AC

## 2018-12-13 MED ORDER — IPRATROPIUM-ALBUTEROL 0.5-2.5 (3) MG/3ML IN SOLN
3.0000 mL | Freq: Four times a day (QID) | RESPIRATORY_TRACT | Status: DC
  Administered 2018-12-13: 3 mL via RESPIRATORY_TRACT
  Filled 2018-12-13 (×2): qty 3

## 2018-12-13 MED ORDER — ASPIRIN 81 MG PO TBEC: 81.0000 mg | DELAYED_RELEASE_TABLET | Freq: Every day | ORAL | 3 refills | Status: AC

## 2018-12-13 MED ORDER — METOPROLOL SUCCINATE ER 50 MG PO TB24: 50.0000 mg | ORAL_TABLET | Freq: Every day | ORAL | 2 refills | Status: DC

## 2018-12-13 MED ORDER — TRAMADOL HCL 50 MG PO TABS: 50.0000 mg | ORAL_TABLET | Freq: Two times a day (BID) | ORAL | 0 refills | Status: AC | PRN

## 2018-12-13 NOTE — Progress Notes (Signed)
Progress Note  Patient Name: Larry Klein Date of Encounter: 12/13/2018  Primary Cardiologist: Jenne Campus, MD   Subjective   Says he continues to feel wiped out.  Denies chest pain or worsening shortness of breath.  Inpatient Medications    Scheduled Meds: . amiodarone  400 mg Oral BID  . aspirin EC  81 mg Oral Daily  . atorvastatin  40 mg Oral q1800  . calcitRIOL  0.25 mcg Oral Q T,Th,Sa-HD  . calcium carbonate  2 tablet Oral TID AC  . Chlorhexidine Gluconate Cloth  6 each Topical Q0600  . doxycycline  100 mg Oral Q12H  . ipratropium-albuterol  3 mL Nebulization QID  . metoprolol succinate  50 mg Oral Daily  . multivitamin  1 tablet Oral QHS  . pantoprazole  40 mg Oral Q0600  . predniSONE  40 mg Oral QAC breakfast  . umeclidinium-vilanterol  1 puff Inhalation Daily   Continuous Infusions:  PRN Meds: acetaminophen, albuterol   Vital Signs    Vitals:   12/12/18 1509 12/12/18 2016 12/13/18 0447 12/13/18 0720  BP:  111/88 118/81   Pulse:  68    Resp:  16 20   Temp:  97.8 F (36.6 C) 97.9 F (36.6 C)   TempSrc:  Oral    SpO2: 98% 100% 95% 99%  Weight:   94.2 kg   Height:        Intake/Output Summary (Last 24 hours) at 12/13/2018 0905 Last data filed at 12/12/2018 1509 Gross per 24 hour  Intake 240 ml  Output 1200 ml  Net -960 ml   Filed Weights   12/12/18 0643 12/12/18 1051 12/13/18 0447  Weight: 97.4 kg 93.4 kg 94.2 kg    Telemetry    A. fib, 80-90 range- Personally Reviewed  ECG    NA - Personally Reviewed  Physical Exam   GEN: No acute distress.   Neck: No JVD Cardiac: Irregular, no murmurs, rubs, or gallops.  Respiratory: Clear to auscultation bilaterally. GI: Soft, nontender, non-distended  MS:  Trace bilateral lower extremity edema Neuro:  Nonfocal  Psych: Normal affect   Labs    Chemistry Recent Labs  Lab 12/07/18 2237  12/09/18 0311 12/09/18 1632 12/12/18 0714  NA 137   < > 134* 138 135  K 5.2*   < > 4.4 4.5 3.8   CL 103   < > 100 103 98  CO2 16*   < > 24 22 23   GLUCOSE 122*   < > 93 100* 109*  BUN 91*   < > 44* 53* 56*  CREATININE 8.14*   < > 5.14* 5.74* 5.86*  CALCIUM 8.7*   < > 8.0* 8.3* 8.4*  PROT 6.2*  --  5.7*  --   --   ALBUMIN 3.7  --  3.5 3.4* 3.3*  AST 39  --  42*  --   --   ALT 77*  --  70*  --   --   ALKPHOS 57  --  67  --   --   BILITOT 0.8  --  1.3*  --   --   GFRNONAA 6*   < > 11* 10* 9*  GFRAA 7*   < > 13* 11* 11*  ANIONGAP 18*   < > 10 13 14    < > = values in this interval not displayed.     Hematology Recent Labs  Lab 12/12/18 0315 12/12/18 0714 12/13/18 0654  WBC 10.0 11.2* 14.6*  RBC 3.83*  3.69* 4.26  HGB 10.8* 10.3* 11.9*  HCT 34.5* 34.0* 39.0  MCV 90.1 92.1 91.5  MCH 28.2 27.9 27.9  MCHC 31.3 30.3 30.5  RDW 18.9* 18.8* 19.5*  PLT 40* 41* 41*    Cardiac Enzymes Recent Labs  Lab 12/07/18 2237 12/08/18 0316 12/08/18 1458  TROPONINI 0.21* 0.20* 0.22*   No results for input(s): TROPIPOC in the last 168 hours.   BNP Recent Labs  Lab 12/07/18 2237  BNP 1,637.3*     DDimer No results for input(s): DDIMER in the last 168 hours.   Radiology    No results found.  Cardiac Studies   Echocardiogram 12/08/18:  IMPRESSIONS   1. The left ventricle has severely reduced systolic function, with an ejection fraction of 20-25%. The cavity size was moderately dilated. There is mildly increased left ventricular wall thickness. Left ventricular diastolic function could not be  evaluated secondary to atrial fibrillation. Left ventricular diffuse hypokinesis. 2. The right ventricle has mildly reduced systolic function. The cavity was mildly enlarged. There is no increase in right ventricular wall thickness. 3. Left atrial size was moderately dilated. 4. Right atrial size was moderately dilated. 5. The mitral valve is degenerative. Moderate thickening of the mitral valve leaflet. Mitral valve regurgitation is moderate to severe by color flow Doppler. The  MR jet is anteriorly-directed. 6. The tricuspid valve is grossly normal. 7. The aortic valve was not well visualized Moderate sclerosis of the aortic valve. 8. The inferior vena cava was dilated in size with <50% respiratory variability.  SUMMARY  LVEF 20-25%, moderately dilated LV, severe global hypokinesis, mildly reduced RV systolic function, moderate biatrial enlargment, moderate to severe eccentric MR, mild TR, aortic valve sclerosis, dilated IVC  Nm Myocar Multi W/spect W/wall Motion / Ef  Result Date: 12/10/2018  No T wave inversion was noted during stress.  Defect 1: There is a medium defect of moderate severity present in the basal inferior and basal inferolateral location.  Defect 2: There is a small defect of moderate severity present in the apex location.  The left ventricular ejection fraction is severely decreased (<30%).  Findings consistent with ischemia and prior myocardial infarction.  This is a high risk study. High risk pharmacological nuclear perfusion study due to severely depressed left ventricular systolic function. Perfusion images suggest a moderate-size inferobasal scar and a small area of apical ischemia, which is largely reversible. The degree of ventricular dysfunction is out of proportion to the area of the perfusion abnormalities, suggesting mixed etiology for the cardiomyopathy.   Patient Profile     63 y.o. male with a history of CAD s/p CABGx4 in 2017 in [place], [place], ESRD on hemodialysis M/W/F, atrial fibrillation not on anticoagulation secondary to chronic thrombocytopenia,hypertension, hyperlipidemia, COPD, who is being seen today for the evaluation ofchest painand dyspnea.Echocardiogram this admission shows ejection fraction 20 to 25%, moderate biatrial enlargement and moderate to severe mitral regurgitation. Nuclear study shows inferobasal scar and mild apical ischemia.  Assessment & Plan    1. Cardiomyopathy/chronic systolic  heart failure: As outlined in previous notes there is likely an ischemic component of his cardiomyopathy as based on his report his ejection fraction was in the 40s at time of bypass in [place]. However LV function is worse. Nuclear study does not suggest significant ischemia as cause. There may be a component of tachycardia mediated cardiomyopathy from uncontrolled atrial fibrillation.  HR 80-90 bpm range. Continue amiodarone. Continue Toprol-XL 50 mg daily. Will need to reassess LV function in 3 months.  If ejection fraction less than 35% would consider ICD. Volume management via hemodialysis.  2. Persistent atrial fibrillation: Duration of his atrial fibrillation is unknown. Elevated rate could be contributing to his cardiomyopathy. He did not tolerate higher dose metoprolol as his blood pressure dropped into the 90s following dialysis and he was symptomatic. Continue Toprol-XL 50 mg daily (started 3/28). Continue amiodarone 400 mg twice daily to assist with rate control. Continue for 2 weeks and then decrease to 200 mg daily. Ideally I would like to anticoagulate for 3 weeks and then attempt cardioversion. However he has a long history of intermittent thrombocytopenia with unclear evaluation in the past. His platelet count has decreased to 41,000. No longer on heparin. He will need hematology evaluation as an outpatient. Once evaluation complete could anticoagulate at that point if okay with hematology.   3. Mitral regurgitation: Dr. Stanford Breed reviewed the patient's echocardiogram and mitral regurgitation felt to be moderate. He will need follow-up echocardiogram in the future.   4. End-stage renal disease: Dialysis per nephrology.  5. Thrombocytopenia: We will need hematology evaluation either as outpatient or inpatient. 41k today.  6. Upper respiratory infection-management per primary care.  7. CAD: Continue ASA, atorvastatin, and beta blocker.   Dispo: I will arrange  for fu with primary cardiologist.  For questions or updates, please contact [facility] Please consult [email or web address] for contact info under Cardiology/STEMI.      Signed, Kate Sable, MD  12/13/2018, 9:05 AM

## 2018-12-13 NOTE — Progress Notes (Signed)
Pharmacy Heparin Induced Thrombocytopenia (HIT) Note:  Larry Klein is an 63 y.o. male being evaluated for HIT. Heparin was started (for afib) on 3/23 then was stopped on 3/27. She also gets heparin with HD. Baseline platelets were 214 on 3/23. Of note platelet count was 30-76 in 10/2018.  -HIT labs were ordered on 3/27 when platelets dropped to 52. -Percent drop > 50% however platelet fall began 3/25 which is an early fall for HIT  Auto-populate labs:  Heparin Induced Plt Ab  Date/Time Value Ref Range Status  12/11/2018 09:01 AM 0.153 0.000 - 0.400 OD Final    Comment:    (NOTE) Performed At: [facility] [place], [place] [id number] Rush Farmer MD AJ:[id number]       CALCULATE SCORE:  4Ts (see the HIT Algorithm) Score  Thrombocytopenia 2  Timing 2  Thrombosis 0  Other causes of thrombocytopenia 0  Total 4       HIT Ab 0.153 OD, SRA pending, r/o with HIT Ab and 4T score of 4, plts 41 this AM  Plan  D/c heparin allergy at this time [facility] plan with cardiology and hematology evaluation   Bertis Ruddy, PharmD Clinical Pharmacist Please check AMION for all [facility] numbers 12/13/2018 11:36 AM

## 2018-12-13 NOTE — Progress Notes (Addendum)
Larry Klein Progress Note   Subjective:   Seen in room. C/o chest pain, palpitations, dyspnea which is ongoing. No fever or N/V. Cardiology following and adjusting medications/recommendations which is much appreciated.  Objective Vitals:   12/12/18 2016 12/13/18 0447 12/13/18 0720 12/13/18 0903  BP: 111/88 118/81  124/88  Pulse: 68     Resp: 16 20    Temp: 97.8 F (36.6 C) 97.9 F (36.6 C)    TempSrc: Oral     SpO2: 100% 95% 99%   Weight:  94.2 kg    Height:       Physical Exam General: Sitting up on edge of bed, NAD.  Heart: Irreg Irreg; 2/6 murmur Lungs: CTAB Extremities: 1+ LE edema to mid-shin, scattered excoriations Dialysis Access: TDC, L forearm AVF + bruit  Additional Objective Labs: Basic Metabolic Panel: Recent Labs  Lab 12/09/18 0311 12/09/18 1632 12/12/18 0714  NA 134* 138 135  K 4.4 4.5 3.8  CL 100 103 98  CO2 24 22 23   GLUCOSE 93 100* 109*  BUN 44* 53* 56*  CREATININE 5.14* 5.74* 5.86*  CALCIUM 8.0* 8.3* 8.4*  PHOS  --  4.7* 4.4   Liver Function Tests: Recent Labs  Lab 12/07/18 2237 12/09/18 0311 12/09/18 1632 12/12/18 0714  AST 39 42*  --   --   ALT 77* 70*  --   --   ALKPHOS 57 67  --   --   BILITOT 0.8 1.3*  --   --   PROT 6.2* 5.7*  --   --   ALBUMIN 3.7 3.5 3.4* 3.3*   CBC: Recent Labs  Lab 12/07/18 2237  12/10/18 0246 12/11/18 0258 12/12/18 0315 12/12/18 0714 12/13/18 0654  WBC 12.0*   < > 8.9 8.9 10.0 11.2* 14.6*  NEUTROABS 10.4*  --   --   --   --   --   --   HGB 11.4*   < > 11.0* 10.2* 10.8* 10.3* 11.9*  HCT 35.4*   < > 35.6* 32.9* 34.5* 34.0* 39.0  MCV 90.1   < > 90.4 89.9 90.1 92.1 91.5  PLT 214   < > 90* 52* 40* 41* 41*   < > = values in this interval not displayed.   Cardiac Enzymes: Recent Labs  Lab 12/07/18 2237 12/08/18 0316 12/08/18 1458  TROPONINI 0.21* 0.20* 0.22*   CBG: Recent Labs  Lab 12/08/18 1441  GLUCAP 91   Medications:  . amiodarone  400 mg Oral BID  . aspirin EC  81  mg Oral Daily  . atorvastatin  40 mg Oral q1800  . calcitRIOL  0.25 mcg Oral Q T,Th,Sa-HD  . calcium carbonate  2 tablet Oral TID AC  . Chlorhexidine Gluconate Cloth  6 each Topical Q0600  . doxycycline  100 mg Oral Q12H  . ipratropium-albuterol  3 mL Nebulization QID  . metoprolol succinate  50 mg Oral Daily  . multivitamin  1 tablet Oral QHS  . pantoprazole  40 mg Oral Q0600  . predniSONE  40 mg Oral QAC breakfast  . umeclidinium-vilanterol  1 puff Inhalation Daily    [place] 4 hrs 0 min, 180NRe Optiflux, BFR 400, DFR Manual 800 mL/min, EDW 95.2 (kg), Dialysate 3.0 K, 2.25 Ca, 1.0 Mg, 100 Dextrose (N3231), Sodium 137 (mEq/L), Bicarb Setting: 35 (mEq/L), UFR Profile: None, Sodium Model: None, Access: CVCatheter-Tunneled, and left lower AVF, no heparin due to hx of chronic thrombocytopenia  Meds: venofer 50/week Mircera 75 q 4  weeks - last dose 3/19 and calcitriol 0.25 TIW hgb 10.4 3/12  Assessment/Plan: 1. Afib with RVR - Ongoing med adjustments per cardiology, now on amiodarone and Toprol XL. New reduced EF (20 - 25%). Myoview not suggestive of ischemia. 2. ESRD: Continue HD per usual TTS schedule via TDC. Next 3/31 unless dyspnea worsens. 3. Perm HD access: Slowly maturing AVF evaluated by Dr. Donzetta Matters 3/24. Recommends vein mapping and consideration of upper arm fistula when we are able to do elective surgery again.  4. Hypertension/volume: + LE edema, continuing to ^ UF as tolerated. 5. Anemia: Hgb 11.9; no ESA needed for now. 6. Metabolic bone disease: Corr Ca/Phos ok. Continue VDRA and Tums as binder for now. 7. Nutrition: Alb low, adding pro-stat 8.  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 Previously on no heparin because of thrombcytopenia. Consider heme consult in future. 9. COPD - per primary; may be part of his peristant SOB. On prednisone taper. 10. DM- per primary  Larry Penton,  PA-C 12/13/2018, 9:48 AM  [facility] Pager: [phone]  I have seen and examined this patient and agree with plan and assessment in the above note with renal recommendations/intervention highlighted.  Continue with HD qTTS while he remains an inpatient.  Cardiology evaluation and plans for outpatient ECHO in 3 months to re-evaluate LV and assess need for ICD placement if <35%.  Will also need Hematology evaluation for thrombocytopenia.  Possible discharge per primary svc if pt not getting further inpatient evaluation.  Larry John A Adama Ferber,MD 12/13/2018 11:45 AM
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
## 2018-12-14 ENCOUNTER — Telehealth: Payer: Self-pay | Admitting: *Deleted

## 2018-12-14 NOTE — Telephone Encounter (Signed)
Patient calling in reporting he was recently in the hospital at [facility] due to his heart rate being high. It is still increasing and decreasing rapidly from 112-40 beats per minute causing palpitations. He denies any pain but does report chest tightness at times and feeling very dizzy like he is going to pass out. He reports he is taking medication as ordred however he went back and forth on if he took his metoprolol today. The last report from the patient was that he did take it today though. Patient would like advice as he doesn't like feeling this way. Will forward to Dr. Agustin Cree, and follow back up with patient.
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
## 2018-12-14 NOTE — Telephone Encounter (Signed)
Left message for patient to return call.

## 2018-12-15 ENCOUNTER — Telehealth: Payer: Self-pay | Admitting: Emergency Medicine

## 2018-12-15 LAB — SEROTONIN RELEASE ASSAY (SRA)
SRA .2 IU/mL UFH Ser-aCnc: <1 % (ref 0–20)
SRA 100IU/mL UFH Ser-aCnc: <1 % (ref 0–20)

## 2018-12-15 NOTE — Telephone Encounter (Signed)
error 

## 2018-12-15 NOTE — Telephone Encounter (Signed)
We have never seen this patient.  We need to schedule him to have initial tele-visit trying to assess the situation also when he goes to dialysis center we should get records of his blood pressure.  To get that.

## 2018-12-15 NOTE — Telephone Encounter (Signed)
Left message for patient to return call. Patient needs to be scheduled for televisit this week.

## 2018-12-16 NOTE — Telephone Encounter (Signed)
Left another message for patient to return call regarding a televisit.

## 2018-12-17 NOTE — Telephone Encounter (Signed)
Left message for patient to return call.

## 2018-12-22 NOTE — Telephone Encounter (Signed)
Called patient and was able to reach him this time. Informed patient we have tried to reach him several times to get him scheduled for a televisit per Dr. Agustin Cree. Offered patient an appointment today, he refused. I scheduled him for an appointment tomorrow. Obtained consent for televisit. Patient has no further questions at this time.     YOUR CARDIOLOGY TEAM HAS ARRANGED FOR AN E-VISIT FOR YOUR APPOINTMENT - PLEASE REVIEW IMPORTANT INFORMATION BELOW SEVERAL DAYS PRIOR TO YOUR APPOINTMENT  Due to the recent COVID-19 pandemic, we are transitioning in-person office visits to tele-medicine visits in an effort to decrease unnecessary exposure to our patients and staff. Medicare and most insurances are covering these visits without a copay needed. We also encourage you to sign up for MyChart if you have not already done so. You will need a smartphone if possible. For patients that do not have this, we can still complete the visit using a regular telephone but do prefer a smartphone to enable video when possible. You may have a close family member that lives with you that can help. If possible, we also ask that you have a blood pressure cuff and scale at home to measure your blood pressure, heart rate and weight prior to your scheduled appointment. Patients with clinical needs that need an in-person evaluation and testing will still be able to come to the office if absolutely necessary. If you have any questions, feel free to call our office.    IF YOU HAVE A SMARTPHONE, PLEASE DOWNLOAD THE WEBEX APP TO YOUR SMARTPHONE  - If Apple, go to [employer] and type in WebEx in the search bar. [place] [employer], the blue/green circle. The app is free but as with any other app download, your phone may require you to verify saved payment information or Apple password. You do NOT have to create a WebEx account.  - If Android, go to [employer] and type in [employer] in the search bar. [place] [employer], the blue/green circle. The app is free but as with any other app download, your phone may require you to verify saved payment information or Android password. You do NOT have to create a WebEx account.  It is very helpful to have this downloaded before your visit.    2-3 DAYS BEFORE YOUR APPOINTMENT  You will receive a telephone call from one of our [facility] team members - your caller ID may say "Unknown caller." If this is a video visit, we will confirm that you have been able to download the WebEx app. We will remind you check your blood pressure, heart rate and weight prior to your scheduled appointment. If you have an Apple Watch or Kardia, please upload any pertinent ECG strips the day before or morning of your appointment to [place]. Our staff will also make sure you have reviewed the consent and agree to move forward with your scheduled tele-health visit.     THE DAY OF YOUR APPOINTMENT  Approximately 15 minutes prior to your scheduled appointment, you will receive a telephone call from one of [facility] team - your caller ID may say "Unknown caller."  Our staff will confirm medications, vital signs for the day and any symptoms you may be experiencing. Please have this information available prior to the time of visit start. It may also be helpful for you to have a pad of paper and pen handy for any instructions given during your visit. They will also walk you through  joining the [employer] if this is a video visit.    CONSENT FOR TELE-HEALTH VISIT - PLEASE REVIEW  I hereby voluntarily request, consent and authorize [facility] and its employed or contracted physicians, physician assistants, nurse practitioners or other licensed health care professionals (the Practitioner), to provide me with telemedicine health care services (the "Services") as deemed necessary by the treating Practitioner. I acknowledge and consent to receive the Services by the Practitioner via  telemedicine. I understand that the telemedicine visit will involve communicating with the Practitioner through live audiovisual communication technology and the disclosure of certain medical information by electronic transmission. I acknowledge that I have been given the opportunity to request an in-person assessment or other available alternative prior to the telemedicine visit and am voluntarily participating in the telemedicine visit.  I understand that I have the right to withhold or withdraw my consent to the use of telemedicine in the course of my care at any time, without affecting my right to future care or treatment, and that the Practitioner or I may terminate the telemedicine visit at any time. I understand that I have the right to inspect all information obtained and/or recorded in the course of the telemedicine visit and may receive copies of available information for a reasonable fee.  I understand that some of the potential risks of receiving the Services via telemedicine include:  [place] Kitchen Delay or interruption in medical evaluation due to technological equipment failure or disruption; . Information transmitted may not be sufficient (e.g. poor resolution of images) to allow for appropriate medical decision making by the Practitioner; and/or  . In rare instances, security protocols could fail, causing a breach of personal health information.  Furthermore, I acknowledge that it is my responsibility to provide information about my medical history, conditions and care that is complete and accurate to the best of my ability. I acknowledge that Practitioner's advice, recommendations, and/or decision may be based on factors not within their control, such as incomplete or inaccurate data provided by me or distortions of diagnostic images or specimens that may result from electronic transmissions. I understand that the practice of medicine is not an exact science and that Practitioner makes no warranties or  guarantees regarding treatment outcomes. I acknowledge that I will receive a copy of this consent concurrently upon execution via email to the email address I last provided but may also request a printed copy by calling the office of [facility].    I understand that my insurance will be billed for this visit.   I have read or had this consent read to me. . I understand the contents of this consent, which adequately explains the benefits and risks of the Services being provided via telemedicine.  . I have been provided ample opportunity to ask questions regarding this consent and the Services and have had my questions answered to my satisfaction. . I give my informed consent for the services to be provided through the use of telemedicine in my medical care  By participating in this telemedicine visit I agree to the above.   Patient agrees to consent.

## 2018-12-23 ENCOUNTER — Inpatient Hospital Stay (HOSPITAL_COMMUNITY)
Admission: EM | Admit: 2018-12-23 | Discharge: 2019-01-21 | DRG: 264 | Disposition: A | Discharge status: Home / Self Care | Payer: Medicare Other | Source: Other Acute Inpatient Hospital | Attending: Internal Medicine | Admitting: Internal Medicine

## 2018-12-23 ENCOUNTER — Other Ambulatory Visit: Payer: Self-pay

## 2018-12-23 ENCOUNTER — Telehealth: Payer: Medicare Other | Admitting: Cardiology

## 2018-12-23 ENCOUNTER — Telehealth: Payer: Self-pay | Admitting: Emergency Medicine

## 2018-12-23 ENCOUNTER — Encounter (HOSPITAL_COMMUNITY): Payer: Self-pay | Admitting: Internal Medicine

## 2018-12-23 DIAGNOSIS — IMO0002 Reserved for concepts with insufficient information to code with codable children: Secondary | ICD-10-CM

## 2018-12-23 DIAGNOSIS — K219 Gastro-esophageal reflux disease without esophagitis: Secondary | ICD-10-CM | POA: Diagnosis not present

## 2018-12-23 DIAGNOSIS — M79671 Pain in right foot: Secondary | ICD-10-CM | POA: Diagnosis not present

## 2018-12-23 DIAGNOSIS — N186 End stage renal disease: Secondary | ICD-10-CM | POA: Diagnosis not present

## 2018-12-23 DIAGNOSIS — I5023 Acute on chronic systolic (congestive) heart failure: Secondary | ICD-10-CM | POA: Diagnosis not present

## 2018-12-23 DIAGNOSIS — I252 Old myocardial infarction: Secondary | ICD-10-CM

## 2018-12-23 DIAGNOSIS — Z7982 Long term (current) use of aspirin: Secondary | ICD-10-CM

## 2018-12-23 DIAGNOSIS — Z951 Presence of aortocoronary bypass graft: Secondary | ICD-10-CM

## 2018-12-23 DIAGNOSIS — I132 Hypertensive heart and chronic kidney disease with heart failure and with stage 5 chronic kidney disease, or end stage renal disease: Secondary | ICD-10-CM | POA: Diagnosis present

## 2018-12-23 DIAGNOSIS — J449 Chronic obstructive pulmonary disease, unspecified: Secondary | ICD-10-CM | POA: Diagnosis not present

## 2018-12-23 DIAGNOSIS — E8889 Other specified metabolic disorders: Secondary | ICD-10-CM | POA: Diagnosis present

## 2018-12-23 DIAGNOSIS — R0600 Dyspnea, unspecified: Secondary | ICD-10-CM

## 2018-12-23 DIAGNOSIS — D693 Immune thrombocytopenic purpura: Secondary | ICD-10-CM | POA: Diagnosis present

## 2018-12-23 DIAGNOSIS — R0789 Other chest pain: Secondary | ICD-10-CM | POA: Diagnosis present

## 2018-12-23 DIAGNOSIS — T18128A Food in esophagus causing other injury, initial encounter: Secondary | ICD-10-CM | POA: Diagnosis not present

## 2018-12-23 DIAGNOSIS — Z87891 Personal history of nicotine dependence: Secondary | ICD-10-CM

## 2018-12-23 DIAGNOSIS — Z9049 Acquired absence of other specified parts of digestive tract: Secondary | ICD-10-CM

## 2018-12-23 DIAGNOSIS — R0602 Shortness of breath: Secondary | ICD-10-CM

## 2018-12-23 DIAGNOSIS — R079 Chest pain, unspecified: Secondary | ICD-10-CM

## 2018-12-23 DIAGNOSIS — E875 Hyperkalemia: Secondary | ICD-10-CM | POA: Diagnosis present

## 2018-12-23 DIAGNOSIS — D631 Anemia in chronic kidney disease: Secondary | ICD-10-CM | POA: Diagnosis present

## 2018-12-23 DIAGNOSIS — I4891 Unspecified atrial fibrillation: Secondary | ICD-10-CM

## 2018-12-23 DIAGNOSIS — I25110 Atherosclerotic heart disease of native coronary artery with unstable angina pectoris: Secondary | ICD-10-CM | POA: Diagnosis not present

## 2018-12-23 DIAGNOSIS — Z20828 Contact with and (suspected) exposure to other viral communicable diseases: Secondary | ICD-10-CM | POA: Diagnosis present

## 2018-12-23 DIAGNOSIS — I4821 Permanent atrial fibrillation: Secondary | ICD-10-CM | POA: Diagnosis not present

## 2018-12-23 DIAGNOSIS — I428 Other cardiomyopathies: Secondary | ICD-10-CM | POA: Diagnosis not present

## 2018-12-23 DIAGNOSIS — Z79899 Other long term (current) drug therapy: Secondary | ICD-10-CM

## 2018-12-23 DIAGNOSIS — I472 Ventricular tachycardia: Secondary | ICD-10-CM | POA: Diagnosis not present

## 2018-12-23 DIAGNOSIS — Z8249 Family history of ischemic heart disease and other diseases of the circulatory system: Secondary | ICD-10-CM

## 2018-12-23 DIAGNOSIS — I255 Ischemic cardiomyopathy: Secondary | ICD-10-CM | POA: Diagnosis present

## 2018-12-23 DIAGNOSIS — E785 Hyperlipidemia, unspecified: Secondary | ICD-10-CM | POA: Diagnosis present

## 2018-12-23 DIAGNOSIS — I429 Cardiomyopathy, unspecified: Secondary | ICD-10-CM

## 2018-12-23 DIAGNOSIS — D696 Thrombocytopenia, unspecified: Secondary | ICD-10-CM

## 2018-12-23 DIAGNOSIS — R945 Abnormal results of liver function studies: Secondary | ICD-10-CM | POA: Diagnosis not present

## 2018-12-23 DIAGNOSIS — N261 Atrophy of kidney (terminal): Secondary | ICD-10-CM | POA: Diagnosis not present

## 2018-12-23 DIAGNOSIS — I080 Rheumatic disorders of both mitral and aortic valves: Secondary | ICD-10-CM | POA: Diagnosis not present

## 2018-12-23 DIAGNOSIS — K222 Esophageal obstruction: Secondary | ICD-10-CM | POA: Diagnosis not present

## 2018-12-23 DIAGNOSIS — R1310 Dysphagia, unspecified: Secondary | ICD-10-CM

## 2018-12-23 DIAGNOSIS — R2242 Localized swelling, mass and lump, left lower limb: Secondary | ICD-10-CM | POA: Diagnosis present

## 2018-12-23 DIAGNOSIS — I739 Peripheral vascular disease, unspecified: Secondary | ICD-10-CM | POA: Diagnosis present

## 2018-12-23 DIAGNOSIS — K7689 Other specified diseases of liver: Secondary | ICD-10-CM | POA: Diagnosis present

## 2018-12-23 DIAGNOSIS — Z95828 Presence of other vascular implants and grafts: Secondary | ICD-10-CM

## 2018-12-23 DIAGNOSIS — N2581 Secondary hyperparathyroidism of renal origin: Secondary | ICD-10-CM | POA: Diagnosis not present

## 2018-12-23 DIAGNOSIS — R7989 Other specified abnormal findings of blood chemistry: Secondary | ICD-10-CM

## 2018-12-23 DIAGNOSIS — I953 Hypotension of hemodialysis: Secondary | ICD-10-CM | POA: Diagnosis not present

## 2018-12-23 DIAGNOSIS — J690 Pneumonitis due to inhalation of food and vomit: Secondary | ICD-10-CM

## 2018-12-23 DIAGNOSIS — Z992 Dependence on renal dialysis: Secondary | ICD-10-CM

## 2018-12-23 DIAGNOSIS — R229 Localized swelling, mass and lump, unspecified: Secondary | ICD-10-CM

## 2018-12-23 HISTORY — DX: Peripheral vascular disease, unspecified: I73.9

## 2018-12-23 LAB — CBC
HCT: 37.1 % — ABNORMAL LOW (ref 39.0–52.0)
Hemoglobin: 11.9 g/dL — ABNORMAL LOW (ref 13.0–17.0)
MCH: 29.2 pg (ref 26.0–34.0)
MCHC: 32.1 g/dL (ref 30.0–36.0)
MCV: 90.9 fL (ref 80.0–100.0)
Platelets: 56 10*3/uL — ABNORMAL LOW (ref 150–400)
RBC: 4.08 MIL/uL — ABNORMAL LOW (ref 4.22–5.81)
RDW: 21 % — ABNORMAL HIGH (ref 11.5–15.5)
WBC: 9.7 10*3/uL (ref 4.0–10.5)
nRBC: 0.3 % — ABNORMAL HIGH (ref 0.0–0.2)

## 2018-12-23 LAB — TROPONIN I
Troponin I: 0.26 ng/mL (ref <0.03)
Troponin I: 0.25 ng/mL (ref <0.03)

## 2018-12-23 LAB — BASIC METABOLIC PANEL
Anion gap: 21 — ABNORMAL HIGH (ref 5–15)
BUN: 44 mg/dL — ABNORMAL HIGH (ref 8–23)
CO2: 18 mmol/L — ABNORMAL LOW (ref 22–32)
Calcium: 9.2 mg/dL (ref 8.9–10.3)
Chloride: 97 mmol/L — ABNORMAL LOW (ref 98–111)
Creatinine, Ser: 7.33 mg/dL — ABNORMAL HIGH (ref 0.61–1.24)
GFR calc Af Amer: 8 mL/min — ABNORMAL LOW (ref >60)
GFR calc non Af Amer: 7 mL/min — ABNORMAL LOW (ref >60)
Glucose, Bld: 81 mg/dL (ref 70–99)
Potassium: 4.7 mmol/L (ref 3.5–5.1)
Sodium: 136 mmol/L (ref 135–145)

## 2018-12-23 LAB — HEPARIN LEVEL (UNFRACTIONATED): Heparin Unfractionated: <0.1 IU/mL — ABNORMAL LOW (ref 0.30–0.70)

## 2018-12-23 MED ORDER — SODIUM CHLORIDE 0.9% FLUSH
3.0000 mL | INTRAVENOUS | Status: DC | PRN
  Administered 2019-01-18: 3 mL via INTRAVENOUS
  Filled 2018-12-23: qty 3

## 2018-12-23 MED ORDER — ONDANSETRON HCL 4 MG PO TABS: 4.0000 mg | ORAL_TABLET | Freq: Four times a day (QID) | ORAL | Status: DC | PRN

## 2018-12-23 MED ORDER — ACETAMINOPHEN 325 MG PO TABS
650.0000 mg | ORAL_TABLET | Freq: Four times a day (QID) | ORAL | Status: DC | PRN
  Administered 2018-12-29 – 2019-01-04 (×4): 650 mg via ORAL
  Filled 2018-12-23 (×2): qty 2

## 2018-12-23 MED ORDER — ASPIRIN 81 MG PO CHEW
81.0000 mg | CHEWABLE_TABLET | Freq: Every day | ORAL | Status: DC
  Administered 2018-12-24: 81 mg via ORAL
  Filled 2018-12-23 (×2): qty 1

## 2018-12-23 MED ORDER — ACETAMINOPHEN 650 MG RE SUPP: 650.0000 mg | Freq: Four times a day (QID) | RECTAL | Status: DC | PRN

## 2018-12-23 MED ORDER — HEPARIN BOLUS VIA INFUSION
2000.0000 [IU] | Freq: Once | INTRAVENOUS | Status: AC
  Administered 2018-12-23: 2000 [IU] via INTRAVENOUS
  Filled 2018-12-23: qty 2000

## 2018-12-23 MED ORDER — SODIUM CHLORIDE 0.9 % IV SOLN
250.0000 mL | INTRAVENOUS | Status: DC | PRN
  Administered 2019-01-17: 250 mL via INTRAVENOUS

## 2018-12-23 MED ORDER — AMIODARONE HCL 200 MG PO TABS
200.0000 mg | ORAL_TABLET | Freq: Every day | ORAL | Status: DC
  Administered 2018-12-24 – 2019-01-21 (×29): 200 mg via ORAL
  Filled 2018-12-23 (×30): qty 1

## 2018-12-23 MED ORDER — CHLORHEXIDINE GLUCONATE CLOTH 2 % EX PADS
6.0000 | MEDICATED_PAD | Freq: Every day | CUTANEOUS | Status: DC
  Administered 2018-12-24 – 2019-01-18 (×17): 6 via TOPICAL

## 2018-12-23 MED ORDER — PANTOPRAZOLE SODIUM 40 MG PO TBEC
40.0000 mg | DELAYED_RELEASE_TABLET | Freq: Every day | ORAL | Status: DC
  Administered 2018-12-24 – 2019-01-13 (×21): 40 mg via ORAL
  Filled 2018-12-23 (×22): qty 1

## 2018-12-23 MED ORDER — SODIUM CHLORIDE 0.9% FLUSH
3.0000 mL | Freq: Two times a day (BID) | INTRAVENOUS | Status: DC
  Administered 2018-12-23 – 2019-01-11 (×28): 3 mL via INTRAVENOUS

## 2018-12-23 MED ORDER — ONDANSETRON HCL 4 MG/2ML IJ SOLN
4.0000 mg | Freq: Four times a day (QID) | INTRAMUSCULAR | Status: DC | PRN
  Administered 2018-12-24: 4 mg via INTRAVENOUS
  Filled 2018-12-23: qty 2

## 2018-12-23 MED ORDER — HYDROCODONE-ACETAMINOPHEN 5-325 MG PO TABS
1.0000 | ORAL_TABLET | ORAL | Status: DC | PRN
  Administered 2018-12-23 – 2018-12-24 (×3): 1 via ORAL
  Administered 2018-12-25 – 2019-01-06 (×11): 2 via ORAL
  Administered 2019-01-07: 1 via ORAL
  Administered 2019-01-09: 18:00:00 2 via ORAL
  Administered 2019-01-09: 1 via ORAL
  Administered 2019-01-10 – 2019-01-16 (×12): 2 via ORAL
  Administered 2019-01-18: 09:00:00 1 via ORAL
  Administered 2019-01-20 – 2019-01-21 (×3): 2 via ORAL
  Filled 2018-12-23 (×9): qty 2
  Filled 2018-12-23: qty 1
  Filled 2018-12-23 (×2): qty 2
  Filled 2018-12-23 (×2): qty 1
  Filled 2018-12-23 (×13): qty 2
  Filled 2018-12-23: qty 1
  Filled 2018-12-23 (×2): qty 2
  Filled 2018-12-23: qty 1
  Filled 2018-12-23 (×2): qty 2
  Filled 2018-12-23: qty 1
  Filled 2018-12-23: qty 2

## 2018-12-23 MED ORDER — HEPARIN (PORCINE) 25000 UT/250ML-% IV SOLN
1300.0000 [IU]/h | INTRAVENOUS | Status: DC
  Administered 2018-12-23: 1000 [IU]/h via INTRAVENOUS
  Administered 2018-12-23: 1200 [IU]/h via INTRAVENOUS
  Administered 2018-12-25: 1300 [IU]/h via INTRAVENOUS
  Filled 2018-12-23 (×4): qty 250

## 2018-12-23 MED ORDER — ATORVASTATIN CALCIUM 40 MG PO TABS
40.0000 mg | ORAL_TABLET | Freq: Every day | ORAL | Status: DC
  Administered 2018-12-23 – 2019-01-06 (×15): 40 mg via ORAL
  Filled 2018-12-23 (×15): qty 1

## 2018-12-23 MED ORDER — METOPROLOL TARTRATE 5 MG/5ML IV SOLN
5.0000 mg | INTRAVENOUS | Status: DC | PRN
  Filled 2018-12-23: qty 5

## 2018-12-23 MED ORDER — METOPROLOL SUCCINATE ER 50 MG PO TB24
50.0000 mg | ORAL_TABLET | Freq: Every day | ORAL | Status: DC
  Administered 2018-12-23 – 2019-01-11 (×18): 50 mg via ORAL
  Filled 2018-12-23 (×21): qty 1

## 2018-12-23 NOTE — Progress Notes (Signed)
CRITICAL VALUE ALERT  Critical Value:  0.26  Date & Time Notied:  12/23/18 1511  Provider Notified: Dessa Phi

## 2018-12-23 NOTE — Telephone Encounter (Addendum)
Corey Skains, RN at [facility] called yesterday 12/22/2018 reporting the patient was expericening increased shortness of breath, irregular rhythm while receiving treatment. Dr. Agustin Cree spoke with Corey Skains and advised patient go to the emergency department. Patient went to [facility] and looks as if he will be transported to [facility]. No answer when we call today regarding televisit. Will follow up once he is out of the hospital.

## 2018-12-23 NOTE — Telephone Encounter (Signed)
Left message for patient to return call about changing appt time.

## 2018-12-23 NOTE — Progress Notes (Signed)
Chart reviewed - patient seen 2 weeks ago at [facility], now transferred from [place] with chest pain and dyspnea. Low COVID suspicion, however, still for rule-out. Awaiting negative result from [place].  There is not much else to add to this situation that was not already expressed in our consult several weeks ago - main issues are:  1) probable acute on chronic systolic CHF  - volume status managed by nephrology as he is ESRD, need to consult them  2) afib with RVR - ?medication compliance versus due to decompensation - was rate-controlled - agree with diltiazem and heparin - need to watch for thrombocytopenia - not anticoagulated, so cannot pursue rate-control strategy at this time or cardioversion 3) recurrent thrombocytopenia - I do not see that he has had formal hematology consultation - given thrombocytopenia and anemia, he is not a cath candidate until we can understand the etiology of this 4) chest pain -ultimately may need cath (even though myoview was mildly abnormal), 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   Pixie Casino, MD, [facility], [place] Director of the [facility]  [facility] Diplomate of the American Board of Clinical Lipidology Attending Cardiologist  Direct Dial: [phone]  Fax: [phone]  Website:  [email or web address]

## 2018-12-23 NOTE — Consult Note (Signed)
Referring Provider: No ref. provider found Primary Care Physician:  Patient, No Pcp Per Primary Nephrologist:  Dr. Justin Mend  Reason for Consultation: Medical management end-stage renal disease, maintenance of euvolemia, anemia and secondary hyperparathyroidism evaluation  HPI: This is a 63 year old gentleman history of coronary artery disease and COPD.  He has hypertension and end-stage renal disease and dialyzes as per kidney center Tuesday Thursday Saturday.  He was admitted with chest pain.  He underwent CT angiogram of his chest that was negative for pulmonary embolus.  He was transferred to [facility] and started on IV heparin and is to be evaluated by cardiology.  Blood pressure 128/80 pulse 96 temperature 97.6   Home medications aspirin 81 mg daily atorvastatin 40 mg daily Tums 500 mg 2 tablets with meals, DuoNeb every 6 hours as needed, metoprolol XL 50 mg daily, multivitamins 1 daily Protonix 40 mg daily prednisone taper for COPD flare.  Active medications aspirin 81 mg daily, amiodarone 200 mg daily, atorvastatin 40 mg daily metoprolol 50 mg daily Protonix 40 mg daily Heparin IV.  Sodium 136 potassium 4.7 chloride 97 CO2 18 BUN 44 creatinine 7.33 glucose 81 calcium 9.2 troponin 0 0.26 WBC 9.7 hemoglobin 11.9 platelets 56.  Phosphorus 4.4 albumin 3.3  Chest x-ray minimal right pleural effusion no acute pulmonary abnormality noted 12/11/2018  2D echo 12/08/2018 EF 20 to 25%, severely reduced systolic function.   Past Medical History:  Diagnosis Date  . COPD (chronic obstructive pulmonary disease) ([place])   . Coronary artery disease   . Dyspnea   . High cholesterol   . Hypertension   . Renal disorder     Past Surgical History:  Procedure Laterality Date  . CARDIAC SURGERY    . CHOLECYSTECTOMY    . THROMBECTOMY W/ EMBOLECTOMY Left 09/18/2018   Procedure: ARTERIOVENOUS FISTULA LEFT ARM;  Surgeon: Rosetta Posner, MD;  Location: [facility] OR;  Service: Vascular;  Laterality: Left;     Prior to Admission medications   Medication Sig Start Date End Date Taking? Authorizing Provider  acetaminophen (TYLENOL) 500 MG tablet Take 1,000 mg by mouth as needed for mild pain or headache.   Yes [provider]  albuterol (PROVENTIL HFA;VENTOLIN HFA) 108 (90 Base) MCG/ACT inhaler Inhale 2 puffs into the lungs every 6 (six) hours as needed for wheezing.   Yes [provider]  amiodarone (PACERONE) 200 MG tablet Take 400mg  (2 tabs) twice a day for 12 more days, then decrease to 200mg  (1 tab) daily 12/13/18  Yes Rai, Ripudeep K, MD  aspirin EC 81 MG EC tablet Take 1 tablet (81 mg total) by mouth daily. 12/13/18  Yes Rai, Ripudeep K, MD  atorvastatin (LIPITOR) 40 MG tablet Take 1 tablet (40 mg total) by mouth at bedtime. 12/13/18  Yes Rai, Ripudeep K, MD  bismuth subsalicylate (PEPTO BISMOL) 262 MG/15ML suspension Take 30 mLs by mouth every 6 (six) hours as needed for indigestion.   Yes [provider]  calcium carbonate (TUMS - DOSED IN MG ELEMENTAL CALCIUM) 500 MG chewable tablet Chew 2 tablets by mouth 3 (three) times daily before meals.   Yes [provider]  ipratropium-albuterol (DUONEB) 0.5-2.5 (3) MG/3ML SOLN Take 3 mLs by nebulization every 6 (six) hours as needed (wheezing).   Yes [provider]  metoprolol succinate (TOPROL-XL) 50 MG 24 hr tablet Take 1 tablet (50 mg total) by mouth daily. Take with or immediately following a meal. 12/13/18  Yes Rai, Ripudeep K, MD  multivitamin (RENA-VIT) TABS  tablet Take 1 tablet by mouth daily. 12/13/18  Yes Rai, Ripudeep K, MD  pantoprazole (PROTONIX) 40 MG tablet Take 1 tablet (40 mg total) by mouth daily before breakfast. 12/13/18  Yes Rai, Ripudeep K, MD  umeclidinium-vilanterol (ANORO ELLIPTA) 62.5-25 MCG/INH AEPB Inhale 1 puff into the lungs daily. 12/13/18 03/13/19 Yes Rai, Ripudeep K, MD  predniSONE (DELTASONE) 10 MG tablet Prednisone dosing: Take  Prednisone 40mg  (4 tabs) x 1 days, then taper to 30mg   (3 tabs) x 3 days, then 20mg  (2 tabs) x 3days, then 10mg  (1 tab) x 3days, then OFF. Patient not taking: Reported on 12/23/2018 12/14/18   Mendel Corning, MD    Current Facility-Administered Medications  Medication Dose Route Frequency Provider Last Rate Last Dose  . 0.9 %  sodium chloride infusion  250 mL Intravenous PRN Dessa Phi, DO      . acetaminophen (TYLENOL) tablet 650 mg  650 mg Oral Q6H PRN Dessa Phi, DO       Or  . acetaminophen (TYLENOL) suppository 650 mg  650 mg Rectal Q6H PRN Dessa Phi, DO      . [START ON 12/24/2018] amiodarone (PACERONE) tablet 200 mg  200 mg Oral Daily Dessa Phi, DO      . [START ON 12/24/2018] aspirin chewable tablet 81 mg  81 mg Oral Daily Dessa Phi, DO      . atorvastatin (LIPITOR) tablet 40 mg  40 mg Oral QHS Dessa Phi, DO      . heparin ADULT infusion 100 units/mL (25000 units/277mL sodium chloride 0.45%)  1,000 Units/hr Intravenous Continuous Dessa Phi, DO 10 mL/hr at 12/23/18 1414 1,000 Units/hr at 12/23/18 1414  . HYDROcodone-acetaminophen (NORCO/VICODIN) 5-325 MG per tablet 1-2 tablet  1-2 tablet Oral Q4H PRN Dessa Phi, DO   1 tablet at 12/23/18 1458  . metoprolol succinate (TOPROL-XL) 24 hr tablet 50 mg  50 mg Oral Daily Dessa Phi, DO   50 mg at 12/23/18 1458  . ondansetron (ZOFRAN) tablet 4 mg  4 mg Oral Q6H PRN Dessa Phi, DO       Or  . ondansetron [facility]) injection 4 mg  4 mg Intravenous Q6H PRN Dessa Phi, DO      . [START ON 12/24/2018] pantoprazole (PROTONIX) EC tablet 40 mg  40 mg Oral QAC breakfast Dessa Phi, DO      . sodium chloride flush (NS) 0.9 % injection 3 mL  3 mL Intravenous Q12H Dessa Phi, DO      . sodium chloride flush (NS) 0.9 % injection 3 mL  3 mL Intravenous PRN Dessa Phi, DO        Allergies as of 12/22/2018  . (No Active Allergies)    Family History  Problem Relation Age of Onset  . Heart disease Mother   . Heart disease Father     Social History    Socioeconomic History  . Marital status: Single    Spouse name: Not on file  . Number of children: Not on file  . Years of education: Not on file  . Highest education level: Not on file  Occupational History  . Not on file  Social Needs  . Financial resource strain: Not on file  . Food insecurity:    Worry: Not on file    Inability: Not on file  . Transportation needs:    Medical: Not on file    Non-medical: Not on file  Tobacco Use  . Smoking status: Former Smoker    Last attempt  to quit: 09/18/2015    Years since quitting: 3.2  . Smokeless tobacco: Never Used  Substance and Sexual Activity  . Alcohol use: Not Currently  . Drug use: Never  . Sexual activity: Not Currently  Lifestyle  . Physical activity:    Days per week: Not on file    Minutes per session: Not on file  . Stress: Not on file  Relationships  . Social connections:    Talks on phone: Not on file    Gets together: Not on file    Attends religious service: Not on file    Active member of club or organization: Not on file    Attends meetings of clubs or organizations: Not on file    Relationship status: Not on file  . Intimate partner violence:    Fear of current or ex partner: Not on file    Emotionally abused: Not on file    Physically abused: Not on file    Forced sexual activity: Not on file  Other Topics Concern  . Not on file  Social History Narrative  . Not on file    Review of Systems: Gen: Denies any fever, chills, sweats, anorexia, fatigue, weakness, malaise, weight loss, and sleep disorder HEENT: No visual complaints, No history of Retinopathy. Normal external appearance No Epistaxis or Sore throat. No sinusitis.   CV: Chest pain rule out MI.  Unstable angina?  Patient being evaluated by cardiology may ultimately need cardiac catheterization appreciate assistance from [name] Resp: Denies dyspnea at rest, dyspnea with exercise, cough, sputum, wheezing, coughing up blood, and pleurisy.   CT chest performed at [facility] negative for PE GI: Denies vomiting blood, jaundice, and fecal incontinence.   Denies dysphagia or odynophagia. GU : Denies urinary burning, blood in urine, urinary frequency, urinary hesitancy, nocturnal urination, and urinary incontinence.  No renal calculi. MS: Denies joint pain, limitation of movement, and swelling, stiffness, low back pain, extremity pain. Denies muscle weakness, cramps, atrophy.  No use of non steroidal antiinflammatory drugs. Derm: Denies rash, itching, dry skin, hives, moles, warts, or unhealing ulcers.  Psych: Denies depression, anxiety, memory loss, suicidal ideation, hallucinations, paranoia, and confusion. Heme: History of thrombocytopenia considering hematology consultation Neuro: No headache.  No diplopia. No dysarthria.  No dysphasia.  No history of CVA.  No Seizures. No paresthesias.  No weakness. Endocrine No DM.  No Thyroid disease.  No Adrenal disease.  Physical Exam: Vital signs in last 24 hours: Temp:  [97.6 F (36.4 C)] 97.6 F (36.4 C) (04/08 1120) Pulse Rate:  [96] 96 (04/08 1120) Resp:  [18] 18 (04/08 1120) BP: (128)/(80) 128/80 (04/08 1120) Weight:  [92.4 kg] 92.4 kg (04/08 1120)   General:   Chronically ill-appearing gentleman Head:  Normocephalic and atraumatic. Eyes:  Sclera clear, no icterus.   Conjunctiva pink. Ears:  Normal auditory acuity. Nose:  No deformity, discharge,  or lesions. Mouth:  No deformity or lesions, dentition normal. Neck:  Supple; no masses or thyromegaly. JVP not elevated Lungs:  Clear throughout to auscultation.   No wheezes, crackles, or rhonchi. No acute distress. Heart: Regular rate and rhythm not on anticoagulation due to history of thrombocytopenia.  Compliance concerns prescribed amiodarone will check thyroid function panel Abdomen:  Soft, nontender and nondistended. No masses, hepatosplenomegaly or hernias noted. Normal bowel sounds, without guarding, and without rebound.   Msk:   Symmetrical without gross deformities. Normal posture. Pulses:  No carotid, renal, femoral bruits. DP and PT symmetrical and equal Extremities:  Without clubbing or edema. Neurologic:  Alert and  oriented x4;  grossly normal neurologically. Skin:  Intact without significant lesions or rashes. Cervical Nodes:  No significant cervical adenopathy. Psych:  Alert and cooperative. Normal mood and affect.  Intake/Output from previous day: No intake/output data recorded. Intake/Output this shift: Total I/O In: 7.2 [I.V.:7.2] Out: -   Lab Results: Recent Labs    12/23/18 1351  WBC 9.7  HGB 11.9*  HCT 37.1*  PLT 56*   BMET Recent Labs    12/23/18 1351  NA 136  K 4.7  CL 97*  CO2 18*  GLUCOSE 81  BUN 44*  CREATININE 7.33*  CALCIUM 9.2   LFT No results for input(s): PROT, ALBUMIN, AST, ALT, ALKPHOS, BILITOT, BILIDIR, IBILI in the last 72 hours. PT/INR No results for input(s): LABPROT, INR in the last 72 hours. Hepatitis Panel No results for input(s): HEPBSAG, HCVAB, HEPAIGM, HEPBIGM in the last 72 hours.  Studies/Results: No results found.   As per dialysis patient Tuesday Thursday Saturday 4 hours dialysis blood flow rate 400 dialysis flow rate 800 estimated dry weight 92.5 kg 3K/2.25 calcium Access CVC catheter.  No heparin.   0.5 mcg calcitriol  Assessment/Plan:  ESRD-Tuesday Thursday Saturday dialysis will plan dialysis for 12/24/2018  Chest pain.  May need cardiac catheterization at some point concern about thrombocytopenia appreciate assistance from [name]  Thrombocytopenia this appears to be present since January 2020 patient does not receive heparin with dialysis treatments but does receive heparin in the port.  Negative for heparin-induced antibodies in 12/11/2018.  Unclear if patient has had formal hematology consultation  Anemia patient is receiving iron last received iron 12/17/2018 he gets 50 mg Venofer weekly.  There were no ESA's  Metabolic bone disease.   Patient receiving Tums 3 times daily with meals.  Calcium appears to be adequate and is not hypercalcemic  Volume.  History of systolic heart failure will dialyze patient to achieve euvolemia with ultrafiltration.  Atrial fibrillation.  Patient rate controlled no anticoagulation secondary to thrombocytopenia will check thyroid function studies.   LOS: 0 Sherril Croon @TODAY @3 :45 PM

## 2018-12-23 NOTE — Progress Notes (Signed)
ANTICOAGULATION CONSULT NOTE - Initial Consult  Pharmacy Consult for heparin Indication: chest pain/ACS  No Active Allergies  Patient Measurements: Height: 5\' 11"  (180.3 cm) Weight: 203 lb 11.3 oz (92.4 kg) IBW/kg (Calculated) : 75.3 Heparin Dosing Weight: 92 kg  Vital Signs: Temp: 97.6 F (36.4 C) (04/08 1120) Temp Source: Oral (04/08 1120) BP: 128/80 (04/08 1120) Pulse Rate: 96 (04/08 1120)  Labs: No results for input(s): HGB, HCT, PLT, APTT, LABPROT, INR, HEPARINUNFRC, HEPRLOWMOCWT, CREATININE, CKTOTAL, CKMB, TROPONINI in the last 72 hours.  Estimated Creatinine Clearance: 14.8 mL/min (A) (by C-G formula based on SCr of 5.86 mg/dL (H)).  Assessment: CC/HPI: 63 yo m presenting with CP and SOB  PMH: CAD COPD HTN ESRD HD  Anticoag: none pta iv hep for r/o ACS Started at [facility]  ID: low-risk covid r/o  4/8 covid ip (OSH?)  Pulm: CTA at OSH neg for PE or acute findings   Goal of Therapy:  Heparin level 0.3-0.7 units/ml Monitor platelets by anticoagulation protocol: Yes   Plan:  Continue heparin 1000 units/hr 1800 HL Daily HL CBC F/U cards plans  Levester Fresh, PharmD, BCPS, BCCCP Clinical Pharmacist [phone]  Please check AMION for all [place] numbers  12/23/2018 1:24 PM

## 2018-12-23 NOTE — H&P (Addendum)
History and Physical    Larry Klein OHY:[id number] DOB: 1956-05-12 DOA: 12/23/2018  PCP: Patient, No Pcp Per  Patient coming from: [facility] as direct admission   Chief Complaint: Chest pain and shortness of breath  HPI: Larry Klein is a 63 y.o. male with medical history significant of CAD, COPD, hypertension, ESRD on dialysis.  He reports chest pressure and tightness across his entire chest as well as dyspnea with exertion over the past few days.  This is constant in nature.  He denies any fevers or cough.  Had some nausea without vomiting, no diarrhea.  No worsening peripheral edema from his baseline.  He presented to [facility], underwent CTA chest which was negative for PE or any acute findings.  ED physician discussed with [facility] cardiology, was recommended to transfer to [facility] and start on IV heparin.  Patient was apparently also tested for low risk COVID-19 rule out.  Summary of [facility] ED work up  EKG from [place] with A fib, rate 100s, PVC, no ST change WBC 10.2, Hgb 12.9, platelet 83, INR 1.6 ABG 7.51 / 27 / 99  Na 133, K 4.7, Cr 5.4, proBNP 66100 AST 355, ALT 295 CRP 41.4 Procalcitonin 0.52 Ferritin 938 Troponin 0.27 CXR stable cardiac prominence and postop change, stable aortic tortuosity, no edema or consolidation, stable central catheter positioning, no pneumothorax  CTA chest no PE, tortuous aorta, recommend annual imaging follow up, enlargement main pulmonary outflow tract, right pleural effusion   Review of Systems: As per HPI otherwise 10 point review of systems negative.   Past Medical History:  Diagnosis Date  . COPD (chronic obstructive pulmonary disease) ([place])   . Coronary artery disease   . Dyspnea   . High cholesterol   . Hypertension   . Renal disorder     Past Surgical History:  Procedure Laterality Date  . CARDIAC SURGERY    . CHOLECYSTECTOMY    . THROMBECTOMY W/ EMBOLECTOMY Left 09/18/2018   Procedure:  ARTERIOVENOUS FISTULA LEFT ARM;  Surgeon: Rosetta Posner, MD;  Location: [place];  Service: Vascular;  Laterality: Left;     reports that he quit smoking about 3 years ago. He has never used smokeless tobacco. He reports previous alcohol use. He reports that he does not use drugs.  No Active Allergies  Family History  Problem Relation Age of Onset  . Heart disease Mother   . Heart disease Father      Prior to Admission medications   Medication Sig Start Date End Date Taking? Authorizing Provider  acetaminophen (TYLENOL) 500 MG tablet Take 1,000 mg by mouth as needed for mild pain or headache.    [provider]  albuterol (PROVENTIL HFA;VENTOLIN HFA) 108 (90 Base) MCG/ACT inhaler Inhale 2 puffs into the lungs every 6 (six) hours as needed for wheezing.    [provider]  amiodarone (PACERONE) 200 MG tablet Take 400mg  (2 tabs) twice a day for 12 more days, then decrease to 200mg  (1 tab) daily 12/13/18   Rai, Vernelle Emerald, MD  aspirin EC 81 MG EC tablet Take 1 tablet (81 mg total) by mouth daily. 12/13/18   Rai, Vernelle Emerald, MD  atorvastatin (LIPITOR) 40 MG tablet Take 1 tablet (40 mg total) by mouth at bedtime. 12/13/18   Rai, Vernelle Emerald, MD  calcium carbonate (TUMS - DOSED IN MG ELEMENTAL CALCIUM) 500 MG chewable tablet Chew 2 tablets by mouth 3 (three) times daily before meals.    [provider]  ipratropium-albuterol (DUONEB) 0.5-2.5 (3) MG/3ML SOLN Take 3 mLs by nebulization every 6 (six) hours as needed (wheezing).    [provider]  metoprolol succinate (TOPROL-XL) 50 MG 24 hr tablet Take 1 tablet (50 mg total) by mouth daily. Take with or immediately following a meal. 12/13/18   Rai, Ripudeep K, MD  multivitamin (RENA-VIT) TABS tablet Take 1 tablet by mouth daily. 12/13/18   Rai, Vernelle Emerald, MD  pantoprazole (PROTONIX) 40 MG tablet Take 1 tablet (40 mg total) by mouth daily before breakfast. 12/13/18   Rai, Ripudeep K, MD  predniSONE (DELTASONE) 10 MG tablet  Prednisone dosing: Take  Prednisone 40mg  (4 tabs) x 1 days, then taper to 30mg  (3 tabs) x 3 days, then 20mg  (2 tabs) x 3days, then 10mg  (1 tab) x 3days, then OFF. 12/14/18   Rai, Ripudeep K, MD  umeclidinium-vilanterol (ANORO ELLIPTA) 62.5-25 MCG/INH AEPB Inhale 1 puff into the lungs daily. 12/13/18 03/13/19  Mendel Corning, MD    Physical Exam: Vitals:   12/23/18 1120  BP: 128/80  Pulse: 96  Resp: 18  Temp: 97.6 F (36.4 C)  TempSrc: Oral  Weight: 92.4 kg  Height: 5\' 11"  (1.803 m)    Constitutional: NAD, calm, comfortable Eyes: PERRL, lids and conjunctivae normal ENMT: Mucous membranes are moist. Posterior pharynx clear of any exudate or lesions.Normal dentition.  Neck: normal, supple, no masses, no thyromegaly Respiratory: clear to auscultation bilaterally, no wheezing, no crackles. Normal respiratory effort. No accessory muscle use.  Cardiovascular: Irreg rhythm. Trace extremity edema. Abdomen: no tenderness, no masses palpated. No hepatosplenomegaly. Bowel sounds positive.  Musculoskeletal: no clubbing / cyanosis.  Good ROM, no contractures. Normal muscle tone. Left TMA  Skin: no rashes, lesions, ulcers. No induration on exposed skin  Neurologic: CN 2-12 grossly intact. Strength 5/5 in all 4.  Psychiatric: Normal judgment and insight. Alert and oriented x 3. Normal mood.   Labs on Admission: I have personally reviewed following labs and imaging studies  CBC: No results for input(s): WBC, NEUTROABS, HGB, HCT, MCV, PLT in the last 168 hours. Basic Metabolic Panel: No results for input(s): NA, K, CL, CO2, GLUCOSE, BUN, CREATININE, CALCIUM, MG, PHOS in the last 168 hours. GFR: Estimated Creatinine Clearance: 14.8 mL/min (A) (by C-G formula based on SCr of 5.86 mg/dL (H)). Liver Function Tests: No results for input(s): AST, ALT, ALKPHOS, BILITOT, PROT, ALBUMIN in the last 168 hours. No results for input(s): LIPASE, AMYLASE in the last 168 hours. No results for input(s): AMMONIA  in the last 168 hours. Coagulation Profile: No results for input(s): INR, PROTIME in the last 168 hours. Cardiac Enzymes: No results for input(s): CKTOTAL, CKMB, CKMBINDEX, TROPONINI in the last 168 hours. BNP (last 3 results) No results for input(s): PROBNP in the last 8760 hours. HbA1C: No results for input(s): HGBA1C in the last 72 hours. CBG: No results for input(s): GLUCAP in the last 168 hours. Lipid Profile: No results for input(s): CHOL, HDL, LDLCALC, TRIG, CHOLHDL, LDLDIRECT in the last 72 hours. Thyroid Function Tests: No results for input(s): TSH, T4TOTAL, FREET4, T3FREE, THYROIDAB in the last 72 hours. Anemia Panel: No results for input(s): VITAMINB12, FOLATE, FERRITIN, TIBC, IRON, RETICCTPCT in the last 72 hours. Urine analysis: No results found for: COLORURINE, APPEARANCEUR, LABSPEC, PHURINE, GLUCOSEU, HGBUR, BILIRUBINUR, KETONESUR, PROTEINUR, UROBILINOGEN, NITRITE, LEUKOCYTESUR Sepsis Labs: !!!!!!!!!!!!!!!!!!!!!!!!!!!!!!!!!!!!!!!!!!!! @LABRCNTIP (procalcitonin:4,lacticidven:4) )No results found for this or any previous visit (from the past 240 hour(s)).   Radiological Exams on Admission: No results found.  Assessment/Plan Active Problems:   Chest pain   Chest pain  -He was started on IV heparin at [facility] ED prior to transfer. Will need to watch his platelet closely  -Cardiology consult  -Trend trop (0.27 in [place] ED) -Asprin   Persistent atrial fibrillation -Not on chronic anticoagulation due to chronic thrombocytopenia  -Amiodarone, toprol   Chronic systolic CHF -EF 08-65%   COPD -Without exacerbation  ESRD on dialysis TTS  -Last hemodialysis 4/7 -Nephrology consulted   Chronic thrombocytopenia -Hematology to follow as outpatient   COVID-19 rule out -Low risk, on room air, continue droplet/contact precaution. Lab at [facility] pending   PPE worn by physician: surgical mask, face shield, gown, gloves, shoe cover and hair bouffant.    DVT  prophylaxis: Heparin IV  Code Status: Full code, discussed with patient Family Communication: No family at bedside Disposition Plan: Pending further work-up Consults called: Cardiology, Nephrology  Admission status: Observation    Severity of Illness: The appropriate patient status for this patient is OBSERVATION. Observation status is judged to be reasonable and necessary in order to provide the required intensity of service to ensure the patient's safety. The patient's presenting symptoms, physical exam findings, and initial radiographic and laboratory data in the context of their medical condition is felt to place them at decreased risk for further clinical deterioration. Furthermore, it is anticipated that the patient will be medically stable for discharge from the hospital within 2 midnights of admission. The following factors support the patient status of observation.   " The patient's presenting symptoms include chest pain and exertional dyspea. " The physical exam findings include CTAB, irreg rhythm.   Dessa Phi, DO [facility] 12/23/2018, 1:34 PM    How to contact the [facility] Attending or Consulting provider [place] or covering provider during after hours [place], for this patient?  1. Check the care team in [facility] and look for a) attending/consulting TRH provider listed and b) the [facility] team listed 2. Log into [email or web address] and use [place] universal password to access. If you do not have the password, please contact the hospital operator. 3. Locate the [facility] provider you are looking for under [facility] and page to a number that you can be directly reached. 4. If you still have difficulty reaching the provider, please page the [facility] (Director on Call) for the Hospitalists listed on amion for assistance.

## 2018-12-23 NOTE — Progress Notes (Signed)
Pt admitted to [place] unit for r/o, pt is not having any respiratory distress or requiring any oxygen at this time.

## 2018-12-23 NOTE — Progress Notes (Addendum)
ANTICOAGULATION CONSULT NOTE - Follow-Up Consult  Pharmacy Consult for heparin Indication: chest pain/ACS  No Active Allergies  Patient Measurements: Height: 5\' 11"  (180.3 cm) Weight: 203 lb 11.3 oz (92.4 kg) IBW/kg (Calculated) : 75.3 Heparin Dosing Weight: 92 kg  Vital Signs: Temp: 97.6 F (36.4 C) (04/08 1649) Temp Source: Oral (04/08 1649) BP: 104/82 (04/08 1649) Pulse Rate: 94 (04/08 1649)  Labs: Recent Labs    12/23/18 1351 12/23/18 1944  HGB 11.9*  --   HCT 37.1*  --   PLT 56*  --   HEPARINUNFRC  --  <0.10*  CREATININE 7.33*  --   TROPONINI 0.26* 0.25*    Estimated Creatinine Clearance: 11.8 mL/min (A) (by C-G formula based on SCr of 7.33 mg/dL (H)).  Assessment: 68 yoM admitted for ACS workup, also in AFib. Initial heparin level undetectable, no issues with infusion per RN. Thrombocytopenia appears stable from last admit, HIT previously ruled out via SRA.  Goal of Therapy:  Heparin level 0.3-0.7 units/ml Monitor platelets by anticoagulation protocol: Yes   Plan:  -Heparin 2000 x1 then increase to 1200 units/hr -Recheck heparin level with morning labs   Arrie Senate, PharmD, BCPS Clinical Pharmacist [phone] Please check AMION for all [place] numbers 12/23/2018

## 2018-12-23 NOTE — Consult Note (Signed)
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  HPI   Larry Klein is a 63 y.o. male who is being seen today for the evaluation of chest pain and shortness of breath at the request of Dr. Maylene Roes. Requested consultation on this patient for chest pain and shortness of breath - transferred from [facility]. Chart reviewed - he has a history of CAD s/p CABG x 4 in [place], COPD, HTN, ESRD on HD and recent echo on 12/08/2018 showing severely reduced LVEF of 20-25% (afib) and global hypokinesis. He was seen by Dr. Stanford Breed at the time and  Noted to afib with RVR. Given presentation and concern for COVID-19, he was tested and that is pending. He was placed on heparin prior to transfer. He is treated with amiodarone for his afib and is not anticoagulated due to chronic thrombocytopenia. A recent stress test on 12/10/2018, demonstrated small area of apical ischemia and moderate-sized inferobasal scar - LVEF <30%. More concerning, is that he has recurrent thrombocytopenia - with intermittently low platelet count in the 30-40K range - it appears his recent platelet count at [facility] was normal at 257K. Troponin at [facility] was again elevated at 0.28. proBNP was 46000. He was started on diltiazem and heparin. Today he has no complaints - his chest pain has subsided. He tells me that he never feels better after dialysis with respect to breathing. The pressure is his chest has persisted. Apparently his LVEF was around 45% until after bypass  in 2013   PMHx   Past Medical History:  Diagnosis Date   COPD (chronic obstructive pulmonary disease) (HCC)    Coronary artery disease    Dyspnea    High cholesterol    Hypertension    Renal disorder     Past Surgical History:  Procedure Laterality Date   CARDIAC SURGERY     CHOLECYSTECTOMY     THROMBECTOMY W/ EMBOLECTOMY Left 09/18/2018   Procedure: ARTERIOVENOUS FISTULA LEFT ARM;  Surgeon: Rosetta Posner, MD;  Location: [facility] OR;  Service: Vascular;  Laterality: Left;    FAMHx   Family History  Problem Relation Age of Onset   Heart disease Mother    Heart disease Father     SOCHx    reports that he quit smoking about 3 years ago. He has never used smokeless tobacco. He reports previous alcohol use. He reports that he does not use drugs.  Outpatient Medications   No current facility-administered medications on file prior to encounter.    Current Outpatient Medications on File Prior to Encounter  Medication Sig Dispense Refill   acetaminophen (TYLENOL) 500 MG tablet Take 1,000 mg by mouth as needed for mild pain or headache.     albuterol (PROVENTIL HFA;VENTOLIN HFA) 108 (90 Base) MCG/ACT inhaler Inhale 2 puffs into the lungs every 6 (six) hours as needed for wheezing.     amiodarone (PACERONE) 200 MG tablet Take 400mg  (2 tabs) twice a day for 12 more days, then decrease to 200mg  (1 tab) daily 120 tablet 3   aspirin  EC 81 MG EC tablet Take 1 tablet (81 mg total) by mouth daily. 30 tablet 3   atorvastatin (LIPITOR) 40 MG tablet Take 1 tablet (40 mg total) by mouth at bedtime. 90 tablet 3   calcium carbonate (TUMS - DOSED IN MG ELEMENTAL CALCIUM) 500 MG chewable tablet Chew 2 tablets by mouth 3 (three) times daily before meals.     ipratropium-albuterol (DUONEB) 0.5-2.5 (3) MG/3ML SOLN Take 3 mLs by nebulization every 6 (six) hours as needed (wheezing).     metoprolol succinate (TOPROL-XL) 50 MG 24 hr tablet Take 1 tablet (50 mg total) by mouth daily. Take  with or immediately following a meal. 90 tablet 2   multivitamin (RENA-VIT) TABS tablet Take 1 tablet by mouth daily. 90 tablet 3   pantoprazole (PROTONIX) 40 MG tablet Take 1 tablet (40 mg total) by mouth daily before breakfast. 60 tablet 1   predniSONE (DELTASONE) 10 MG tablet Prednisone dosing: Take  Prednisone 40mg  (4 tabs) x 1 days, then taper to 30mg  (3 tabs) x 3 days, then 20mg  (2 tabs) x 3days, then 10mg  (1 tab) x 3days, then OFF. 22 tablet 0   umeclidinium-vilanterol (ANORO ELLIPTA) 62.5-25 MCG/INH AEPB Inhale 1 puff into the lungs daily. 30 each 2    Inpatient Medications    Scheduled Meds:  amiodarone  200 mg Oral Daily   aspirin  81 mg Oral Daily   atorvastatin  40 mg Oral QHS   metoprolol succinate  50 mg Oral Daily   [START ON 12/24/2018] pantoprazole  40 mg Oral QAC breakfast   sodium chloride flush  3 mL Intravenous Q12H    Continuous Infusions:  sodium chloride     heparin      PRN Meds: sodium chloride, acetaminophen **OR** acetaminophen, HYDROcodone-acetaminophen, ondansetron **OR** ondansetron (ZOFRAN) IV, sodium chloride flush   ALLERGIES   No Active Allergies  ROS   Pertinent items noted in HPI and remainder of comprehensive ROS otherwise negative.  Vitals   Vitals:   12/23/18 1120  BP: 128/80  Pulse: 96  Resp: 18  Temp: 97.6 F (36.4 C)  TempSrc: Oral  Weight: 92.4 kg  Height: 5\' 11"  (1.803 m)   No intake or output data in the 24 hours ending 12/23/18 1355 Filed Weights   12/23/18 1120  Weight: 92.4 kg    Physical Exam   General appearance: alert and no distress Neck: JVD - 3 cm above sternal notch and thyroid not enlarged, symmetric, no tenderness/mass/nodules Lungs: diminished breath sounds bibasilar Heart: irregularly irregular rhythm, S1, S2 normal and systolic murmur: early systolic 2/6, decrescendo and blowing at apex Abdomen: soft, non-tender; bowel sounds normal; no masses,  no organomegaly Extremities: edema 1+  bilateral LE and TMA of both feet Pulses: faint distal pulses Skin: Skin color, texture, turgor normal. No rashes or lesions Neurologic: Mental status: Alert, oriented, thought content appropriate Psych: Frustrated, impatient  Labs   No results found for this or any previous visit (from the past 67 hour(s)).  ECG   Afib with RVR at 106, RBBB - Personally Reviewed  Telemetry   Afib with RVR - Personally Reviewed  Radiology   No results found.  Cardiac Studies   Procedure: 2D Echo (12/08/2018)  Indications:    chest pain 427.31   History:        Patient has no prior history of Echocardiogram examinations. End                 stage renal disease, Atrial  Fibrillation; Signs/Symptoms: Chest                 Pain.   Sonographer:    12 Referring Phys: [id number] [place]    1. The left ventricle has severely reduced systolic function, with an ejection fraction of 20-25%. The cavity size was moderately dilated. There is mildly increased left ventricular wall thickness. Left ventricular diastolic function could not be  evaluated secondary to atrial fibrillation. Left ventricular diffuse hypokinesis.  2. The right ventricle has mildly reduced systolic function. The cavity was mildly enlarged. There is no increase in right ventricular wall thickness.  3. Left atrial size was moderately dilated.  4. Right atrial size was moderately dilated.  5. The mitral valve is degenerative. Moderate thickening of the mitral valve leaflet. Mitral valve regurgitation is moderate to severe by color flow Doppler. The MR jet is anteriorly-directed.  6. The tricuspid valve is grossly normal.  7. The aortic valve was not well visualized Moderate sclerosis of the aortic valve.  8. The inferior vena cava was dilated in size with <50% respiratory variability.  SUMMARY   LVEF 20-25%, moderately dilated LV, severe global hypokinesis, mildly reduced RV systolic function, moderate  biatrial enlargment, moderate to severe eccentric MR, mild TR, aortic valve sclerosis, dilated IVC  __________________________________________________________________________ Myoview stress test (12/10/2018)   No T wave inversion was noted during stress.  Defect 1: There is a medium defect of moderate severity present in the basal inferior and basal inferolateral location.  Defect 2: There is a small defect of moderate severity present in the apex location.  The left ventricular ejection fraction is severely decreased (<30%).  Findings consistent with ischemia and prior myocardial infarction.  This is a high risk study.   High risk pharmacological nuclear perfusion study due to severely depressed left ventricular systolic function. Perfusion images suggest a moderate-size inferobasal scar and a small area of apical ischemia, which is largely reversible. The degree of ventricular dysfunction is out of proportion to the area of the perfusion abnormalities, suggesting mixed etiology for the cardiomyopathy.  Impression   Principal Problem:   Chest pain Active Problems:   Dyspnea   ESRD (end stage renal disease) on dialysis [facility])   Atrial fibrillation with RVR (HCC)   Acute on chronic systolic heart failure ([place])   Cardiomyopathy (HCC)   Recommendation   1. Persistent chest pain - recent myoview with small area of ischemia, but otherwise fixed defect - he had 4 vessel CABG in 2017, then LVEF dropped from 45 -> 20%, suggesting that he may have lost a graft or grafts - this could also explain his pain. Dialysis with additional ultrafiltration is necessary given volume overload for CHF management. His symptoms are suggestive of angina - ultimately, I think he will require a repeat heart catheterization - possibly on Monday. We do need to have hematology investigate his intermittent thrombocytopenia - ?bleeding, splenic sequestration, ITP - HITT negative last admit. He is on heparin. We  could not proceed with cath/PCI unless we had some idea if he was at risk for recurrent thrombocytopenia since he would likely need DAPT. For now, will add Imdur for symptom relief and HF. Afib rate control is improved today. Appreciate medicine and nephrology assistance. Please report results of COVID-19 test in the chart from [facility] as soon as available.  Thanks for the consultation. Will continue to follow with you.  Time Spent Directly with Patient:  I have spent a total of 45  minutes with the patient reviewing hospital notes, telemetry, EKGs, labs and examining the patient as well as establishing an assessment and plan that was discussed personally with the patient.  > 50% of time was spent in direct patient care.  Length of Stay:  LOS: 0 days   Pixie Casino, MD, [facility], [place] Director of the Advanced Lipid Disorders &  Cardiovascular Risk Reduction Clinic Diplomate of the American Board of Clinical Lipidology Attending Cardiologist  Direct Dial: [phone]   Fax: [phone]  Website:  [email or web address] [email or web address] Ruari Duggan 12/23/2018, 1:55 PM

## 2018-12-24 DIAGNOSIS — D631 Anemia in chronic kidney disease: Secondary | ICD-10-CM | POA: Diagnosis present

## 2018-12-24 DIAGNOSIS — E875 Hyperkalemia: Secondary | ICD-10-CM | POA: Diagnosis present

## 2018-12-24 DIAGNOSIS — I998 Other disorder of circulatory system: Secondary | ICD-10-CM | POA: Diagnosis not present

## 2018-12-24 DIAGNOSIS — D649 Anemia, unspecified: Secondary | ICD-10-CM | POA: Diagnosis not present

## 2018-12-24 DIAGNOSIS — I5023 Acute on chronic systolic (congestive) heart failure: Secondary | ICD-10-CM | POA: Diagnosis present

## 2018-12-24 DIAGNOSIS — T18128A Food in esophagus causing other injury, initial encounter: Secondary | ICD-10-CM | POA: Diagnosis not present

## 2018-12-24 DIAGNOSIS — N261 Atrophy of kidney (terminal): Secondary | ICD-10-CM | POA: Diagnosis present

## 2018-12-24 DIAGNOSIS — E8889 Other specified metabolic disorders: Secondary | ICD-10-CM | POA: Diagnosis present

## 2018-12-24 DIAGNOSIS — K228 Other specified diseases of esophagus: Secondary | ICD-10-CM | POA: Diagnosis not present

## 2018-12-24 DIAGNOSIS — R0789 Other chest pain: Secondary | ICD-10-CM | POA: Diagnosis present

## 2018-12-24 DIAGNOSIS — I255 Ischemic cardiomyopathy: Secondary | ICD-10-CM | POA: Diagnosis not present

## 2018-12-24 DIAGNOSIS — I4891 Unspecified atrial fibrillation: Secondary | ICD-10-CM | POA: Diagnosis not present

## 2018-12-24 DIAGNOSIS — R2242 Localized swelling, mass and lump, left lower limb: Secondary | ICD-10-CM | POA: Diagnosis not present

## 2018-12-24 DIAGNOSIS — N186 End stage renal disease: Secondary | ICD-10-CM | POA: Diagnosis present

## 2018-12-24 DIAGNOSIS — I472 Ventricular tachycardia: Secondary | ICD-10-CM | POA: Diagnosis not present

## 2018-12-24 DIAGNOSIS — R0602 Shortness of breath: Secondary | ICD-10-CM

## 2018-12-24 DIAGNOSIS — M79671 Pain in right foot: Secondary | ICD-10-CM | POA: Diagnosis not present

## 2018-12-24 DIAGNOSIS — R945 Abnormal results of liver function studies: Secondary | ICD-10-CM | POA: Diagnosis present

## 2018-12-24 DIAGNOSIS — N2581 Secondary hyperparathyroidism of renal origin: Secondary | ICD-10-CM | POA: Diagnosis present

## 2018-12-24 DIAGNOSIS — I25110 Atherosclerotic heart disease of native coronary artery with unstable angina pectoris: Secondary | ICD-10-CM | POA: Diagnosis present

## 2018-12-24 DIAGNOSIS — Z20828 Contact with and (suspected) exposure to other viral communicable diseases: Secondary | ICD-10-CM | POA: Diagnosis present

## 2018-12-24 DIAGNOSIS — I428 Other cardiomyopathies: Secondary | ICD-10-CM | POA: Diagnosis present

## 2018-12-24 DIAGNOSIS — I771 Stricture of artery: Secondary | ICD-10-CM | POA: Diagnosis not present

## 2018-12-24 DIAGNOSIS — Z9049 Acquired absence of other specified parts of digestive tract: Secondary | ICD-10-CM | POA: Diagnosis not present

## 2018-12-24 DIAGNOSIS — Z01810 Encounter for preprocedural cardiovascular examination: Secondary | ICD-10-CM | POA: Diagnosis not present

## 2018-12-24 DIAGNOSIS — J690 Pneumonitis due to inhalation of food and vomit: Secondary | ICD-10-CM | POA: Diagnosis not present

## 2018-12-24 DIAGNOSIS — E785 Hyperlipidemia, unspecified: Secondary | ICD-10-CM | POA: Diagnosis present

## 2018-12-24 DIAGNOSIS — M79609 Pain in unspecified limb: Secondary | ICD-10-CM | POA: Diagnosis not present

## 2018-12-24 DIAGNOSIS — K222 Esophageal obstruction: Secondary | ICD-10-CM | POA: Diagnosis not present

## 2018-12-24 DIAGNOSIS — I2510 Atherosclerotic heart disease of native coronary artery without angina pectoris: Secondary | ICD-10-CM | POA: Diagnosis not present

## 2018-12-24 DIAGNOSIS — I080 Rheumatic disorders of both mitral and aortic valves: Secondary | ICD-10-CM | POA: Diagnosis present

## 2018-12-24 DIAGNOSIS — I4821 Permanent atrial fibrillation: Secondary | ICD-10-CM | POA: Diagnosis present

## 2018-12-24 DIAGNOSIS — D693 Immune thrombocytopenic purpura: Secondary | ICD-10-CM | POA: Diagnosis present

## 2018-12-24 DIAGNOSIS — I739 Peripheral vascular disease, unspecified: Secondary | ICD-10-CM | POA: Diagnosis not present

## 2018-12-24 DIAGNOSIS — D696 Thrombocytopenia, unspecified: Secondary | ICD-10-CM | POA: Diagnosis not present

## 2018-12-24 DIAGNOSIS — Z992 Dependence on renal dialysis: Secondary | ICD-10-CM | POA: Diagnosis not present

## 2018-12-24 DIAGNOSIS — J449 Chronic obstructive pulmonary disease, unspecified: Secondary | ICD-10-CM | POA: Diagnosis present

## 2018-12-24 DIAGNOSIS — N185 Chronic kidney disease, stage 5: Secondary | ICD-10-CM | POA: Diagnosis not present

## 2018-12-24 DIAGNOSIS — R110 Nausea: Secondary | ICD-10-CM | POA: Diagnosis not present

## 2018-12-24 DIAGNOSIS — M7989 Other specified soft tissue disorders: Secondary | ICD-10-CM | POA: Diagnosis not present

## 2018-12-24 DIAGNOSIS — I200 Unstable angina: Secondary | ICD-10-CM | POA: Diagnosis not present

## 2018-12-24 DIAGNOSIS — I132 Hypertensive heart and chronic kidney disease with heart failure and with stage 5 chronic kidney disease, or end stage renal disease: Secondary | ICD-10-CM | POA: Diagnosis present

## 2018-12-24 DIAGNOSIS — I252 Old myocardial infarction: Secondary | ICD-10-CM | POA: Diagnosis not present

## 2018-12-24 DIAGNOSIS — K219 Gastro-esophageal reflux disease without esophagitis: Secondary | ICD-10-CM | POA: Diagnosis present

## 2018-12-24 DIAGNOSIS — R1310 Dysphagia, unspecified: Secondary | ICD-10-CM | POA: Diagnosis not present

## 2018-12-24 LAB — BASIC METABOLIC PANEL
Anion gap: 16 — ABNORMAL HIGH (ref 5–15)
BUN: 52 mg/dL — ABNORMAL HIGH (ref 8–23)
CO2: 21 mmol/L — ABNORMAL LOW (ref 22–32)
Calcium: 8.9 mg/dL (ref 8.9–10.3)
Chloride: 98 mmol/L (ref 98–111)
Creatinine, Ser: 8.33 mg/dL — ABNORMAL HIGH (ref 0.61–1.24)
GFR calc Af Amer: 7 mL/min — ABNORMAL LOW (ref >60)
GFR calc non Af Amer: 6 mL/min — ABNORMAL LOW (ref >60)
Glucose, Bld: 101 mg/dL — ABNORMAL HIGH (ref 70–99)
Potassium: 5.3 mmol/L — ABNORMAL HIGH (ref 3.5–5.1)
Sodium: 135 mmol/L (ref 135–145)

## 2018-12-24 LAB — CBC
HCT: 37.7 % — ABNORMAL LOW (ref 39.0–52.0)
Hemoglobin: 11.9 g/dL — ABNORMAL LOW (ref 13.0–17.0)
MCH: 28.8 pg (ref 26.0–34.0)
MCHC: 31.6 g/dL (ref 30.0–36.0)
MCV: 91.3 fL (ref 80.0–100.0)
Platelets: 57 10*3/uL — ABNORMAL LOW (ref 150–400)
RBC: 4.13 MIL/uL — ABNORMAL LOW (ref 4.22–5.81)
RDW: 20.9 % — ABNORMAL HIGH (ref 11.5–15.5)
WBC: 10.3 10*3/uL (ref 4.0–10.5)
nRBC: 0.5 % — ABNORMAL HIGH (ref 0.0–0.2)

## 2018-12-24 LAB — HEPARIN LEVEL (UNFRACTIONATED)
Heparin Unfractionated: 0.27 IU/mL — ABNORMAL LOW (ref 0.30–0.70)
Heparin Unfractionated: 0.3 IU/mL (ref 0.30–0.70)

## 2018-12-24 LAB — TSH: TSH: 2.734 u[IU]/mL (ref 0.350–4.500)

## 2018-12-24 LAB — SAVE SMEAR(SSMR), FOR PROVIDER SLIDE REVIEW

## 2018-12-24 LAB — TROPONIN I: Troponin I: 0.23 ng/mL (ref <0.03)

## 2018-12-24 MED ORDER — RENA-VITE PO TABS
1.0000 | ORAL_TABLET | Freq: Every day | ORAL | Status: DC
  Administered 2018-12-24 – 2019-01-21 (×28): 1 via ORAL
  Filled 2018-12-24 (×30): qty 1

## 2018-12-24 MED ORDER — SODIUM CHLORIDE 0.9 % IV SOLN: 100.0000 mL | INTRAVENOUS | Status: DC | PRN

## 2018-12-24 MED ORDER — HEPARIN SODIUM (PORCINE) 1000 UNIT/ML DIALYSIS
1000.0000 [IU] | INTRAMUSCULAR | Status: DC | PRN
  Administered 2018-12-24: 1000 [IU] via INTRAVENOUS_CENTRAL
  Filled 2018-12-24 (×2): qty 1

## 2018-12-24 MED ORDER — ISOSORBIDE MONONITRATE ER 30 MG PO TB24
30.0000 mg | ORAL_TABLET | Freq: Every day | ORAL | Status: DC
  Administered 2018-12-25 – 2019-01-11 (×18): 30 mg via ORAL
  Filled 2018-12-24 (×20): qty 1

## 2018-12-24 MED ORDER — ALBUTEROL SULFATE HFA 108 (90 BASE) MCG/ACT IN AERS
2.0000 | INHALATION_SPRAY | Freq: Four times a day (QID) | RESPIRATORY_TRACT | Status: DC | PRN
  Filled 2018-12-24: qty 6.7

## 2018-12-24 MED ORDER — ALTEPLASE 2 MG IJ SOLR
2.0000 mg | Freq: Once | INTRAMUSCULAR | Status: DC | PRN
  Filled 2018-12-24: qty 2

## 2018-12-24 MED ORDER — HEPARIN SODIUM (PORCINE) 1000 UNIT/ML IJ SOLN
INTRAMUSCULAR | Status: AC
  Administered 2018-12-24: 1000 [IU] via INTRAVENOUS_CENTRAL
  Filled 2018-12-24: qty 4

## 2018-12-24 MED ORDER — UMECLIDINIUM-VILANTEROL 62.5-25 MCG/INH IN AEPB
1.0000 | INHALATION_SPRAY | Freq: Every day | RESPIRATORY_TRACT | Status: DC
  Administered 2018-12-24 – 2019-01-20 (×21): 1 via RESPIRATORY_TRACT
  Filled 2018-12-24 (×4): qty 14

## 2018-12-24 NOTE — Progress Notes (Signed)
ANTICOAGULATION CONSULT NOTE - Follow-Up Consult  Pharmacy Consult for heparin Indication: chest pain/ACS  No Active Allergies  Patient Measurements: Height: 5\' 11"  (180.3 cm) Weight: 203 lb 11.3 oz (92.4 kg) IBW/kg (Calculated) : 75.3 Heparin Dosing Weight: 92 kg  Vital Signs: Temp: 97.6 F (36.4 C) (04/09 0849) Temp Source: Oral (04/09 0849) BP: 133/88 (04/09 0849) Pulse Rate: 90 (04/09 0849)  Labs: Recent Labs    12/23/18 1351 12/23/18 1944 12/24/18 0218 12/24/18 1148  HGB 11.9*  --  11.9*  --   HCT 37.1*  --  37.7*  --   PLT 56*  --  57*  --   HEPARINUNFRC  --  <0.10* 0.27* 0.30  CREATININE 7.33*  --  8.33*  --   TROPONINI 0.26* 0.25* 0.23*  --     Estimated Creatinine Clearance: 10.4 mL/min (A) (by C-G formula based on SCr of 8.33 mg/dL (H)).  Assessment: 51 yoM admitted for ACS workup, also in AFib. Initial heparin level undetectable, no issues with infusion per RN. Thrombocytopenia appears stable from last admit, HIT previously ruled out via SRA.  Hep lvl now within goal  Goal of Therapy:  Heparin level 0.3-0.7 units/ml Monitor platelets by anticoagulation protocol: Yes   Plan:  heparin 1300 units/hr Daily HL CBC F/U cards plans  Levester Fresh, PharmD, BCPS, BCCCP Clinical Pharmacist [phone]  Please check AMION for all [place] numbers  12/24/2018 12:40 PM

## 2018-12-24 NOTE — Progress Notes (Signed)
PROGRESS NOTE    Larry Klein  RDE:[id number] DOB: March 26, 1956 DOA: 12/23/2018 PCP: Patient, No Pcp Per   Brief Narrative:  HPI per Dr. Dessa Phi on 12/23/2018 Larry Klein is a 63 y.o. male with medical history significant of CAD, COPD, hypertension, ESRD on dialysis.  He reports chest pressure and tightness across his entire chest as well as dyspnea with exertion over the past few days.  This is constant in nature.  He denies any fevers or cough.  Had some nausea without vomiting, no diarrhea.  No worsening peripheral edema from his baseline.  He presented to [facility], underwent CTA chest which was negative for PE or any acute findings.  ED physician discussed with [facility] cardiology, was recommended to transfer to [facility] and start on IV heparin.  Patient was apparently also tested for low risk COVID-19 rule out.  Summary of [facility] ED work up  EKG from [place] with A fib, rate 100s, PVC, no ST change WBC 10.2, Hgb 12.9, platelet 83, INR 1.6 ABG 7.51 / 27 / 99  Na 133, K 4.7, Cr 5.4, proBNP 66100 AST 355, ALT 295 CRP 41.4 Procalcitonin 0.52 Ferritin 938 Troponin 0.27 CXR stable cardiac prominence and postop change, stable aortic tortuosity, no edema or consolidation, stable central catheter positioning, no pneumothorax  CTA chest no PE, tortuous aorta, recommend annual imaging follow up, enlargement main pulmonary outflow tract, right pleural effusion   **Interim History  Continues to have chest pain and cardiology considering cath in the patient.  Cardiology added Imdur patient is to be dialyzed today.  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  Assessment & Plan:   Principal Problem:   Chest pain Active Problems:   Dyspnea   ESRD (end stage renal disease) on dialysis [facility])   Atrial fibrillation with RVR (HCC)   Acute on chronic systolic heart failure (HCC)   Cardiomyopathy (HCC)  Chest Pain r/o ACS  -He was  started on IV heparin at [facility] ED prior to transfer and will Continue -Trended Troponin and went from 0.26 -> 0.25 -> 0.23 -C/w ASA 81 mg po Daily, Atorvastatin 40 mg po qHS, Metoprolol Succinate 50 mg po Daily -Cardiology consulted  And they added Isosorbide Mononitrate 30 mg po Daily for symptom relief  -Had a Recent Stress Test that demonstrated small area of apical ischemia and Moderate-sized inferobasal scar -C/w Heparin gtt for now -[name] had a CABG x4 in 2017 -Cardiology considering Cardiac Catheterization and possibly Monday. They would not proceed with Cath/PCI unless they had some idea if he was at risk for recurrent thrombocytopenia since he would likely need DAPT but after my discussion with 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 and is to follow up with Hematology in [place]  Persistent Atrial Fibrillation with [facility] -Not on Chronic Anticoagulation due to chronic thrombocytopenia  -C/w Amiodarone 200 mg po Daily and Metoprolol Succinate 50 mg po Daily -C/w Metoprolol Tartrate 5 mg IV q18min PRN for HR>130  Acute on Chronic systolic CHF -Recent EF 63-14% on ECHO on 12/08/2018 -Will need Dialysis with additional ultrafiltration given volume overload for CHF Management -Cardiology Consulted for further Evaluation and recommendations -Per Cardiology may need Repeat Cardiac Catheterization -C/w Metoprolol Succinate 50 mg po Daily  -No ACE/ARB due to Renal Fxn -Continue to Monitor Volume Status Carefully -Now Patient on Isosorbide Mononitrate per Cardiology  COPD -Currently Without Exacerbation -C/w Albuterol IH 2 puff IH q6hprn for Wheezing and C/w Anoro Ellipta IH  1 puff into the lungs   ESRD on dialysis TTS  Hyperkalemia -Last Hemodialysis was 4/7 -Nephrology consulted for Maintenance of HD -BUN/Cr went from 44/7.33 -> 52/8.33 -Patient to be Dialyzed today  -K+ was mild and went from 4.7 -> 5.3 -Continue to Monitor and Trend Renal Fxn  -Repeat CMP in AM   Chronic  Thrombocytopenia -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 [name] -HIT Ab and SRA Negative last admission -Platelet Count went from 56 -> 57  -Discussed case with Hematology 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 Obtain Smear -Last Smear obtained in January was reviewed by Dr. [name] and showed "Platelet morphology appeared normal.  No schistocytes.  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   -Currently There is no indication for treatment in the absence of bleeding and with a stable platelet count above 50,000. -Continue to Monitor for S/Sx of Bleeding -Had been referred to outpatient Hematology in [place]   COVID-19 rule out -Low risk, on room air,  -Continue Droplet/Contact precautions as patient is ruling out.  -Lab at [facility] pending still -Follow up SARS-CoV-2  PPE worn by physician: Pincus Badder, Gown, Gloves, Shoe cover and hair bouffant.  DVT prophylaxis: Anticoagulated with Heparin gtt Code Status: FULL CODE Family Communication: No family present at bedside Disposition Plan: Remain Inpatient for continued workup and treatment   Consultants:   Cardiology  Nephrology  Discussed Case with Hematology    Procedures: None   Antimicrobials:  Anti-infectives (From admission, onward)   None     Subjective: Seen and examined at bedside and states that he was doing okay but still complained of some chest pain described as a 4 out of 10 in severity and if somebody was sitting on his chest.  States that he gets very short of breath with exertion.  Has some leg swelling.  No lightheadedness or dizziness.  No other concerns or complaints at this time and questioning if he is getting dialysis today.   Objective: Vitals:    12/23/18 1120 12/23/18 1649 12/23/18 2307 12/24/18 0849  BP: 128/80 104/82 (!) 192/180 133/88  Pulse: 96 94 60 90  Resp: 18 16 20    Temp: 97.6 F (36.4 C) 97.6 F (36.4 C) 97.6 F (36.4 C) 97.6 F (36.4 C)  TempSrc: Oral Oral Oral Oral  SpO2:  100% 95% 97%  Weight: 92.4 kg     Height: 5\' 11"  (1.803 m)       Intake/Output Summary (Last 24 hours) at 12/24/2018 1322 Last data filed at 12/23/2018 1500 Gross per 24 hour  Intake 7.15 ml  Output -  Net 7.15 ml   Filed Weights   12/23/18 1120  Weight: 92.4 kg   Examination: Physical Exam:  Constitutional: WN/WD overweight Caucasian male in NAD and appears calm but slightly uncomfortable Eyes: Lids and conjunctivae normal, sclerae anicteric  ENMT: External Ears, Nose appear normal. Grossly normal hearing.  Neck: Appears normal, supple, no cervical masses, normal ROM, no appreciable thyromegaly; no JVD Respiratory: Diminished to auscultation bilaterally, no wheezing, rales, rhonchi or crackles. Normal respiratory effort and patient is not tachypenic. No accessory muscle use.  Cardiovascular: Irregularly Irregular, no murmurs / rubs / gallops. S1 and S2 auscultated. No extremity edema. 2+ pedal pulses. No carotid bruits.  Abdomen: Soft, non-tender, Mildly distended.  No masses palpated. No appreciable hepatosplenomegaly. Bowel sounds positive.  GU: Deferred. Musculoskeletal: No clubbing / cyanosis of digits/nails. No joint deformity upper and lower extremities.  Skin: Has bruising and skin discoloration on arms and legs. No induration; Warm and dry.  Neurologic: CN 2-12 grossly intact with no focal deficits. Romberg sign and cerebellar reflexes not assessed.  Psychiatric: Normal judgment and insight. Alert and oriented x 3. Pleasant mood and appropriate affect.   Data Reviewed: I have personally reviewed following labs and imaging studies  CBC: Recent Labs  Lab 12/23/18 1351 12/24/18 0218  WBC 9.7 10.3  HGB 11.9* 11.9*  HCT 37.1*  37.7*  MCV 90.9 91.3  PLT 56* 57*   Basic Metabolic Panel: Recent Labs  Lab 12/23/18 1351 12/24/18 0218  NA 136 135  K 4.7 5.3*  CL 97* 98  CO2 18* 21*  GLUCOSE 81 101*  BUN 44* 52*  CREATININE 7.33* 8.33*  CALCIUM 9.2 8.9   GFR: Estimated Creatinine Clearance: 10.4 mL/min (A) (by C-G formula based on SCr of 8.33 mg/dL (H)). Liver Function Tests: No results for input(s): AST, ALT, ALKPHOS, BILITOT, PROT, ALBUMIN in the last 168 hours. No results for input(s): LIPASE, AMYLASE in the last 168 hours. No results for input(s): AMMONIA in the last 168 hours. Coagulation Profile: No results for input(s): INR, PROTIME in the last 168 hours. Cardiac Enzymes: Recent Labs  Lab 12/23/18 1351 12/23/18 1944 12/24/18 0218  TROPONINI 0.26* 0.25* 0.23*   BNP (last 3 results) No results for input(s): PROBNP in the last 8760 hours. HbA1C: No results for input(s): HGBA1C in the last 72 hours. CBG: No results for input(s): GLUCAP in the last 168 hours. Lipid Profile: No results for input(s): CHOL, HDL, LDLCALC, TRIG, CHOLHDL, LDLDIRECT in the last 72 hours. Thyroid Function Tests: Recent Labs    12/24/18 0218  TSH 2.734   Anemia Panel: No results for input(s): VITAMINB12, FOLATE, FERRITIN, TIBC, IRON, RETICCTPCT in the last 72 hours. Sepsis Labs: No results for input(s): PROCALCITON, LATICACIDVEN in the last 168 hours.  No results found for this or any previous visit (from the past 240 hour(s)).    Radiology Studies: No results found. Scheduled Meds: . amiodarone  200 mg Oral Daily  . aspirin  81 mg Oral Daily  . atorvastatin  40 mg Oral QHS  . Chlorhexidine Gluconate Cloth  6 each Topical Q0600  . [START ON 12/25/2018] isosorbide mononitrate  30 mg Oral Daily  . metoprolol succinate  50 mg Oral Daily  . pantoprazole  40 mg Oral QAC breakfast  . sodium chloride flush  3 mL Intravenous Q12H   Continuous Infusions: . sodium chloride    . heparin 1,300 Units/hr (12/24/18  0251)    LOS: 0 days   Kerney Elbe, DO [facility] PAGER is on [place]  If 7PM-7AM, please contact night-coverage [email or web address] Password [email or web address] 12/24/2018, 1:22 PM

## 2018-12-24 NOTE — Progress Notes (Signed)
ANTICOAGULATION CONSULT NOTE - Follow-Up Consult  Pharmacy Consult for Heparin Indication: chest pain/ACS  No Active Allergies  Patient Measurements: Height: 5\' 11"  (180.3 cm) Weight: 203 lb 11.3 oz (92.4 kg) IBW/kg (Calculated) : 75.3 Heparin Dosing Weight: 92 kg  Vital Signs: Temp: 97.6 F (36.4 C) (04/08 2307) Temp Source: Oral (04/08 2307) BP: 192/180 (04/08 2307) Pulse Rate: 60 (04/08 2307)  Labs: Recent Labs    12/23/18 1351 12/23/18 1944 12/24/18 0218  HGB 11.9*  --  11.9*  HCT 37.1*  --  37.7*  PLT 56*  --  57*  HEPARINUNFRC  --  <0.10* 0.27*  CREATININE 7.33*  --  8.33*  TROPONINI 0.26* 0.25*  --     Estimated Creatinine Clearance: 10.4 mL/min (A) (by C-G formula based on SCr of 8.33 mg/dL (H)).  Assessment: 24 yoM admitted for ACS workup, also in AFib. Initial heparin level undetectable, no issues with infusion per RN. Thrombocytopenia appears stable from last admit, HIT previously ruled out via SRA.  4/9 AM update: heparin level just below goal, no issues per RN.   Goal of Therapy:  Heparin level 0.3-0.7 units/ml Monitor platelets by anticoagulation protocol: Yes   Plan:  -Inc heparin to 1300 units/hr -Re-check heparin level in 8 hours  Narda Bonds, PharmD, [place] Pharmacist Phone: [phone]

## 2018-12-24 NOTE — Care Management Obs Status (Signed)
[place] NOTIFICATION   Patient Details  Name: Malosi Hemstreet MRN: [id number] Date of Birth: [birth year]   Medicare Observation Status Notification Given:  Yes    Gerrianne Scale Vonnie Ligman, LCSW 12/24/2018, 11:46 AM

## 2018-12-24 NOTE — Progress Notes (Signed)
Larry Klein has chronic thrombocytopenia.  He was evaluated by Dr. 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  I discussed the case with Dr. Alfredia Ferguson.  Hematology is available to see Larry Klein as needed.  Outpatient follow-up will be scheduled at the [facility] cancer center. Please call hematology as needed.

## 2018-12-24 NOTE — Progress Notes (Signed)
[facility] ROUNDING NOTE   Subjective:   This is a 59 gentleman Tuesday Thursday Saturday dialysis at [facility] kidney center.  He has history of coronary disease and COPD he was admitted with chest pain and is ruled out.  He also is rule out COVID-19 and is low risk  Blood pressure 133/88 pulse 89 temperature 97.6 O2 sats 97% room air  Sodium 135 potassium 5.3 chloride 98 CO2 21 BUN 53 creatinine 8.33 calcium 8.9 phosphorus 4.4 Albumin 3.3 hemoglobin 11.9 WBC 10.3 platelets 57  Medications amiodarone 200 mg daily, atorvastatin 40 mg daily, aspirin 81 mg daily, Toprol-XL 50 mg daily, Protonix 40 mg daily, heparin IV,  Objective:  Vital signs in last 24 hours:  Temp:  [97.6 F (36.4 C)] 97.6 F (36.4 C) (04/09 0849) Pulse Rate:  [60-96] 90 (04/09 0849) Resp:  [16-20] 20 (04/08 2307) BP: (104-192)/(80-180) 133/88 (04/09 0849) SpO2:  [95 %-100 %] 97 % (04/09 0849) Weight:  [92.4 kg] 92.4 kg (04/08 1120)  Weight change:  Filed Weights   12/23/18 1120  Weight: 92.4 kg    Intake/Output: I/O last 3 completed shifts: In: 7.2 [I.V.:7.2] Out: -    Intake/Output this shift:  No intake/output data recorded.  CVS-regular rate and rhythm with no murmurs rubs or gallops RS- CTA no wheezes or rales left IJ cath left AV graft ABD- BS present soft non-distended EXT- no edema   Basic Metabolic Panel: Recent Labs  Lab 12/23/18 1351 12/24/18 0218  NA 136 135  K 4.7 5.3*  CL 97* 98  CO2 18* 21*  GLUCOSE 81 101*  BUN 44* 52*  CREATININE 7.33* 8.33*  CALCIUM 9.2 8.9    Liver Function Tests: No results for input(s): AST, ALT, ALKPHOS, BILITOT, PROT, ALBUMIN in the last 168 hours. No results for input(s): LIPASE, AMYLASE in the last 168 hours. No results for input(s): AMMONIA in the last 168 hours.  CBC: Recent Labs  Lab 12/23/18 1351 12/24/18 0218  WBC 9.7 10.3  HGB 11.9* 11.9*  HCT 37.1* 37.7*  MCV 90.9 91.3  PLT 56* 57*    Cardiac Enzymes: Recent  Labs  Lab 12/23/18 1351 12/23/18 1944 12/24/18 0218  TROPONINI 0.26* 0.25* 0.23*    BNP: Invalid input(s): POCBNP  CBG: No results for input(s): GLUCAP in the last 168 hours.  Microbiology: Results for orders placed or performed during the hospital encounter of 12/07/18  MRSA PCR Screening     Status: None   Collection Time: 12/07/18  9:07 PM  Result Value Ref Range Status   MRSA by PCR NEGATIVE NEGATIVE Final    Comment:        The GeneXpert MRSA Assay (FDA approved for NASAL specimens only), is one component of a comprehensive MRSA colonization surveillance program. It is not intended to diagnose MRSA infection nor to guide or monitor treatment for MRSA infections. Performed at [facility], [place] [address], [place], [place] [address]     Coagulation Studies: No results for input(s): LABPROT, INR in the last 72 hours.  Urinalysis: No results for input(s): COLORURINE, LABSPEC, PHURINE, GLUCOSEU, HGBUR, BILIRUBINUR, KETONESUR, PROTEINUR, UROBILINOGEN, NITRITE, LEUKOCYTESUR in the last 72 hours.  Invalid input(s): APPERANCEUR    Imaging: No results found.   Medications:   . sodium chloride    . heparin 1,300 Units/hr (12/24/18 0251)   . amiodarone  200 mg Oral Daily  . aspirin  81 mg Oral Daily  . atorvastatin  40 mg Oral QHS  .  Chlorhexidine Gluconate Cloth  6 each Topical Q0600  . metoprolol succinate  50 mg Oral Daily  . pantoprazole  40 mg Oral QAC breakfast  . sodium chloride flush  3 mL Intravenous Q12H   sodium chloride, acetaminophen **OR** acetaminophen, HYDROcodone-acetaminophen, metoprolol tartrate, ondansetron **OR** ondansetron (ZOFRAN) IV, sodium chloride flush  Assessment/ Plan:   ESRD-Monday and Tuesday Thursday Saturday dialysis plan dialysis treatment for 05/2019  Chest pain appreciate assistance from [name].  Concerned about the cardiac catheterization in setting of thrombocytopenia  Thrombocytopenia appears to be chronic  and has been present since January he had a heparin-induced antibody that was negative in 12/11/2018  Anemia stable does not appear to be an issue at this time no ESA's as an outpatient  Metabolic bone disease phosphorus appears to be in good control will continue to follow  Hyperlipidemia continues on atorvastatin  GERD continues on Protonix  Atrial fibrillation rate controlled no anticoagulation secondary thrombocytopenia TSH 2.734   LOS: 0 Sherril Croon @TODAY @10 :31 AM

## 2018-12-25 DIAGNOSIS — D696 Thrombocytopenia, unspecified: Secondary | ICD-10-CM

## 2018-12-25 DIAGNOSIS — I255 Ischemic cardiomyopathy: Secondary | ICD-10-CM

## 2018-12-25 DIAGNOSIS — R0789 Other chest pain: Secondary | ICD-10-CM

## 2018-12-25 LAB — HEPARIN LEVEL (UNFRACTIONATED): Heparin Unfractionated: 0.38 IU/mL (ref 0.30–0.70)

## 2018-12-25 LAB — MAGNESIUM: Magnesium: 2 mg/dL (ref 1.7–2.4)

## 2018-12-25 LAB — CBC WITH DIFFERENTIAL/PLATELET
Abs Immature Granulocytes: 0.06 10*3/uL (ref 0.00–0.07)
Basophils Absolute: 0.1 10*3/uL (ref 0.0–0.1)
Basophils Relative: 1 %
Eosinophils Absolute: 0.3 10*3/uL (ref 0.0–0.5)
Eosinophils Relative: 3 %
HCT: 37.9 % — ABNORMAL LOW (ref 39.0–52.0)
Hemoglobin: 12 g/dL — ABNORMAL LOW (ref 13.0–17.0)
Immature Granulocytes: 1 %
Lymphocytes Relative: 8 %
Lymphs Abs: 0.7 10*3/uL (ref 0.7–4.0)
MCH: 29.1 pg (ref 26.0–34.0)
MCHC: 31.7 g/dL (ref 30.0–36.0)
MCV: 92 fL (ref 80.0–100.0)
Monocytes Absolute: 0.7 10*3/uL (ref 0.1–1.0)
Monocytes Relative: 8 %
Neutro Abs: 7.2 10*3/uL (ref 1.7–7.7)
Neutrophils Relative %: 79 %
Platelets: 42 10*3/uL — ABNORMAL LOW (ref 150–400)
RBC: 4.12 MIL/uL — ABNORMAL LOW (ref 4.22–5.81)
RDW: 21.1 % — ABNORMAL HIGH (ref 11.5–15.5)
WBC: 9 10*3/uL (ref 4.0–10.5)
nRBC: 0.2 % (ref 0.0–0.2)

## 2018-12-25 LAB — PHOSPHORUS: Phosphorus: 4.9 mg/dL — ABNORMAL HIGH (ref 2.5–4.6)

## 2018-12-25 MED ORDER — CHLORHEXIDINE GLUCONATE CLOTH 2 % EX PADS: 6.0000 | MEDICATED_PAD | Freq: Every day | CUTANEOUS | Status: DC

## 2018-12-25 NOTE — Progress Notes (Addendum)
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  Subjective   Due to the current COVID-19 pandemic and the desire to minimize use of PPE and reduce provider exposure, the patient exam was deferred or conducted via video or teleconference.  Larry Klein had dialysis yesterday - 3L UF was removed. Platelet count has dropped again this admission - now in the 40-50K range - appreciate Dr. 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 Given this chronic condition, he is not a candidate for cath/PCI as he will not tolerate DAPT.  Objective   Vitals:   12/24/18 2330 12/24/18 2339 12/25/18 0050 12/25/18 0733  BP: 106/74 113/78 (!) 139/121 (!) 136/125  Pulse: 68 72 88 (!) 57  Resp:  18  18  Temp:  97.8 F (36.6 C) 97.8 F (36.6 C)   TempSrc:  Oral Oral   SpO2:  97%  95%  Weight:  88.1 kg    Height:        Intake/Output Summary (Last 24 hours) at 12/25/2018 0920 Last data filed at 12/24/2018 2339 Gross per 24 hour  Intake 360 ml  Output 3000 ml  Net -2640 ml   Filed Weights   12/23/18 1120 12/24/18 1930 12/24/18 2339  Weight: 92.4 kg 92.1 kg 88.1 kg    Physical Exam   Due to the current COVID-19 pandemic and the desire to minimize use of PPE and reduce provider exposure, the patient exam was deferred or conducted via video or teleconference.  Inpatient Medications    Scheduled Meds: . amiodarone  200 mg Oral Daily  . aspirin  81 mg Oral Daily  . atorvastatin  40  mg Oral QHS  . Chlorhexidine Gluconate Cloth  6 each Topical Q0600  . isosorbide mononitrate  30 mg Oral Daily  . metoprolol succinate  50 mg Oral Daily  . multivitamin  1 tablet Oral Daily  . pantoprazole  40 mg Oral QAC breakfast  . sodium chloride flush  3 mL Intravenous Q12H  . umeclidinium-vilanterol  1 puff Inhalation Daily    Continuous Infusions: . sodium chloride    . sodium chloride    . sodium chloride    . heparin 1,300 Units/hr (12/24/18 0251)    PRN Meds: sodium chloride, sodium chloride, sodium chloride, acetaminophen **OR** acetaminophen, albuterol, alteplase, heparin, HYDROcodone-acetaminophen, metoprolol tartrate, ondansetron **OR** ondansetron (ZOFRAN) IV, sodium chloride flush   Labs   Results for orders placed or performed during the hospital encounter of 12/23/18 (from the past 48 hour(s))  CBC     Status: Abnormal   Collection Time: 12/23/18  1:51 PM  Result Value Ref Range   WBC 9.7 4.0 - 10.5 K/uL   RBC 4.08 (L) 4.22 - 5.81 MIL/uL   Hemoglobin 11.9 (L) 13.0 - 17.0 g/dL   HCT 37.1 (L) 39.0 - 52.0 %   MCV 90.9 80.0 - 100.0 fL   MCH 29.2 26.0 - 34.0 pg   MCHC 32.1 30.0 - 36.0 g/dL   RDW 21.0 (H) 11.5 -  15.5 %   Platelets 56 (L) 150 - 400 K/uL    Comment: REPEATED TO VERIFY PLATELET COUNT CONFIRMED BY SMEAR Immature Platelet Fraction may be clinically indicated, consider ordering this additional test [id number]    nRBC 0.3 (H) 0.0 - 0.2 %    Comment: Performed at [facility] [address], [place], [place] [address]  Basic metabolic panel     Status: Abnormal   Collection Time: 12/23/18  1:51 PM  Result Value Ref Range   Sodium 136 135 - 145 mmol/L   Potassium 4.7 3.5 - 5.1 mmol/L   Chloride 97 (L) 98 - 111 mmol/L   CO2 18 (L) 22 - 32 mmol/L   Glucose, Bld 81 70 - 99 mg/dL   BUN 44 (H) 8 - 23 mg/dL   Creatinine, Ser 7.33 (H) 0.61 - 1.24 mg/dL   Calcium 9.2 8.9 - 10.3 mg/dL   GFR calc non Af Amer 7 (L) >60 mL/min   GFR calc Af Amer 8  (L) >60 mL/min   Anion gap 21 (H) 5 - 15    Comment: Performed at [facility], [place] [address], [place], [place] [address]  Troponin I - Now Then Q6H     Status: Abnormal   Collection Time: 12/23/18  1:51 PM  Result Value Ref Range   Troponin I 0.26 (HH) <0.03 ng/mL    Comment: CRITICAL RESULT CALLED TO, READ BACK BY AND VERIFIED WITH: RODDY,B RN @ 2993 12/23/18 LEONARD,A Performed at [facility], [place] [address], [place], [place] [address]   Troponin I - Now Then Q6H     Status: Abnormal   Collection Time: 12/23/18  7:44 PM  Result Value Ref Range   Troponin I 0.25 (HH) <0.03 ng/mL    Comment: CRITICAL VALUE NOTED.  VALUE IS CONSISTENT WITH PREVIOUSLY REPORTED AND CALLED VALUE. Performed at [facility], [place] [address], [place], [place] [address]   Heparin level (unfractionated)     Status: Abnormal   Collection Time: 12/23/18  7:44 PM  Result Value Ref Range   Heparin Unfractionated <0.10 (L) 0.30 - 0.70 IU/mL    Comment: (NOTE) If heparin results are below expected values, and patient dosage has  been confirmed, suggest follow up testing of antithrombin III levels. Performed at [facility], [place] [address] [place], [place] [address]   Troponin I - Now Then Q6H     Status: Abnormal   Collection Time: 12/24/18  2:18 AM  Result Value Ref Range   Troponin I 0.23 (HH) <0.03 ng/mL    Comment: CRITICAL VALUE NOTED.  VALUE IS CONSISTENT WITH PREVIOUSLY REPORTED AND CALLED VALUE. Performed at [facility], [place] [address], [place], [place] [address]   Heparin level (unfractionated)     Status: Abnormal   Collection Time: 12/24/18  2:18 AM  Result Value Ref Range   Heparin Unfractionated 0.27 (L) 0.30 - 0.70 IU/mL    Comment: (NOTE) If heparin results are below expected values, and patient dosage has  been confirmed, suggest follow up testing of antithrombin III levels. Performed at [facility], [place] [address], [place] [address]   CBC      Status: Abnormal   Collection Time: 12/24/18  2:18 AM  Result Value Ref Range   WBC 10.3 4.0 - 10.5 K/uL   RBC 4.13 (L) 4.22 - 5.81 MIL/uL   Hemoglobin 11.9 (L) 13.0 - 17.0 g/dL  HCT 37.7 (L) 39.0 - 52.0 %   MCV 91.3 80.0 - 100.0 fL   MCH 28.8 26.0 - 34.0 pg   MCHC 31.6 30.0 - 36.0 g/dL   RDW 20.9 (H) 11.5 - 15.5 %   Platelets 57 (L) 150 - 400 K/uL    Comment: Immature Platelet Fraction may be clinically indicated, consider ordering this additional test [id number] CONSISTENT WITH PREVIOUS RESULT    nRBC 0.5 (H) 0.0 - 0.2 %    Comment: Performed at [facility], [place] [address], [place], [place] [address]  Basic metabolic panel     Status: Abnormal   Collection Time: 12/24/18  2:18 AM  Result Value Ref Range   Sodium 135 135 - 145 mmol/L   Potassium 5.3 (H) 3.5 - 5.1 mmol/L   Chloride 98 98 - 111 mmol/L   CO2 21 (L) 22 - 32 mmol/L   Glucose, Bld 101 (H) 70 - 99 mg/dL   BUN 52 (H) 8 - 23 mg/dL   Creatinine, Ser 8.33 (H) 0.61 - 1.24 mg/dL   Calcium 8.9 8.9 - 10.3 mg/dL   GFR calc non Af Amer 6 (L) >60 mL/min   GFR calc Af Amer 7 (L) >60 mL/min   Anion gap 16 (H) 5 - 15    Comment: Performed at [facility] [address], [place], [place] [address]  TSH     Status: None   Collection Time: 12/24/18  2:18 AM  Result Value Ref Range   TSH 2.734 0.350 - 4.500 uIU/mL    Comment: Performed by a 3rd Generation assay with a functional sensitivity of <=0.01 uIU/mL. Performed at [facility], [place] [address] [place], [place] [address]   Save Smear     Status: None   Collection Time: 12/24/18  2:18 AM  Result Value Ref Range   Smear Review SMEAR STAINED AND AVAILABLE FOR REVIEW     Comment: Performed at [facility] [address], [place], [place] [address]  Heparin level (unfractionated)     Status: None   Collection Time: 12/24/18 11:48 AM  Result Value Ref Range   Heparin Unfractionated 0.30 0.30 - 0.70 IU/mL    Comment: (NOTE) If heparin results  are below expected values, and patient dosage has  been confirmed, suggest follow up testing of antithrombin III levels. Performed at [facility], [place] [address], [place], [place] [address]   Heparin level (unfractionated)     Status: None   Collection Time: 12/25/18  3:06 AM  Result Value Ref Range   Heparin Unfractionated 0.38 0.30 - 0.70 IU/mL    Comment: (NOTE) If heparin results are below expected values, and patient dosage has  been confirmed, suggest follow up testing of antithrombin III levels. Performed at [facility], [place] [address], [place], [place] [address]   CBC with Differential/Platelet     Status: Abnormal   Collection Time: 12/25/18  3:06 AM  Result Value Ref Range   WBC 9.0 4.0 - 10.5 K/uL   RBC 4.12 (L) 4.22 - 5.81 MIL/uL   Hemoglobin 12.0 (L) 13.0 - 17.0 g/dL   HCT 37.9 (L) 39.0 - 52.0 %   MCV 92.0 80.0 - 100.0 fL   MCH 29.1 26.0 - 34.0 pg   MCHC 31.7 30.0 - 36.0 g/dL   RDW 21.1 (H) 11.5 - 15.5 %   Platelets 42 (L) 150 - 400 K/uL    Comment: Immature Platelet  Fraction may be clinically indicated, consider ordering this additional test [id number] CONSISTENT WITH PREVIOUS RESULT    nRBC 0.2 0.0 - 0.2 %   Neutrophils Relative % 79 %   Neutro Abs 7.2 1.7 - 7.7 K/uL   Lymphocytes Relative 8 %   Lymphs Abs 0.7 0.7 - 4.0 K/uL   Monocytes Relative 8 %   Monocytes Absolute 0.7 0.1 - 1.0 K/uL   Eosinophils Relative 3 %   Eosinophils Absolute 0.3 0.0 - 0.5 K/uL   Basophils Relative 1 %   Basophils Absolute 0.1 0.0 - 0.1 K/uL   Immature Granulocytes 1 %   Abs Immature Granulocytes 0.06 0.00 - 0.07 K/uL   Polychromasia PRESENT     Comment: Performed at [facility], [address] [place], [place] [address]  Comprehensive metabolic panel     Status: Abnormal   Collection Time: 12/25/18  3:06 AM  Result Value Ref Range   Sodium 137 135 - 145 mmol/L   Potassium 3.7 3.5 - 5.1 mmol/L   Chloride 99 98 - 111 mmol/L   CO2 24 22 - 32 mmol/L    Glucose, Bld 104 (H) 70 - 99 mg/dL   BUN 24 (H) 8 - 23 mg/dL   Creatinine, Ser 5.38 (H) 0.61 - 1.24 mg/dL    Comment: DELTA CHECK NOTED   Calcium 8.5 (L) 8.9 - 10.3 mg/dL   Total Protein 5.7 (L) 6.5 - 8.1 g/dL   Albumin 3.3 (L) 3.5 - 5.0 g/dL   AST 107 (H) 15 - 41 U/L   ALT 228 (H) 0 - 44 U/L   Alkaline Phosphatase 105 38 - 126 U/L   Total Bilirubin 1.2 0.3 - 1.2 mg/dL   GFR calc non Af Amer 10 (L) >60 mL/min   GFR calc Af Amer 12 (L) >60 mL/min   Anion gap 14 5 - 15    Comment: Performed at [facility], [place] [address] [place], [place] [address]  Phosphorus     Status: Abnormal   Collection Time: 12/25/18  3:06 AM  Result Value Ref Range   Phosphorus 4.9 (H) 2.5 - 4.6 mg/dL    Comment: Performed at [facility] [address] [place], [place] [address]  Magnesium     Status: None   Collection Time: 12/25/18  3:06 AM  Result Value Ref Range   Magnesium 2.0 1.7 - 2.4 mg/dL    Comment: Performed at [facility] [address], [place], [place] [address]    ECG   N/A  Telemetry   Afib with CVR, occasional PVC's - Personally Reviewed  Radiology    No results found.  Cardiac Studies   N/A  Assessment   1. Principal Problem: 2.   Chest pain 3. Active Problems: 4.   Dyspnea 5.   ESRD (end stage renal disease) on dialysis ([place]) 6.   Atrial fibrillation with RVR ([place]) 7.   Acute on chronic systolic heart failure ([place]) 8.   Cardiomyopathy ([place]) 9.   Plan   1. Successful dialysis yesterday with 3L UF removed - plan to start imdur 30 mg daily for medical management of refractory angina. Blood pressure is up and down, but generally controlled. Cardiac regimen includes amiodarone 200 mg daily, atorvastatin 40 mg QHS, IV heparin (afib/VTE prophylaxis), toprol XL 50 mg daily. Given his progressive thrombocytopenia with platelet count <50K - will discontinue aspirin. No sign that he has had acute coronary syndrome with flat elevated  troponins.  Time  Spent Directly with Patient:  I have spent a total of 25 minutes with the patient reviewing hospital notes, telemetry, EKGs, labs and examining the patient as well as establishing an assessment and plan that was discussed personally with the patient.  > 50% of time was spent in direct patient care.  Length of Stay:  LOS: 1 day   Pixie Casino, MD, [facility], [place] Director of the Advanced Lipid Disorders &  Cardiovascular Risk Reduction Clinic Diplomate of the American Board of Clinical Lipidology Attending Cardiologist  Direct Dial: [phone]  Fax: [phone]  Website:  [email or web address] 12/25/2018, 9:20 AM

## 2018-12-25 NOTE — Progress Notes (Signed)
ANTICOAGULATION CONSULT NOTE - Follow-Up Consult  Pharmacy Consult for heparin Indication: chest pain/ACS  No Active Allergies  Patient Measurements: Height: 5\' 11"  (180.3 cm) Weight: 194 lb 3.6 oz (88.1 kg) IBW/kg (Calculated) : 75.3 Heparin Dosing Weight: 92 kg  Vital Signs: Temp: 97.8 F (36.6 C) (04/10 0050) Temp Source: Oral (04/10 0050) BP: 136/125 (04/10 0733) Pulse Rate: 57 (04/10 0733)  Labs: Recent Labs    12/23/18 1351  12/23/18 1944 12/24/18 0218 12/24/18 1148 12/25/18 0306  HGB 11.9*  --   --  11.9*  --  12.0*  HCT 37.1*  --   --  37.7*  --  37.9*  PLT 56*  --   --  57*  --  42*  HEPARINUNFRC  --    < > <0.10* 0.27* 0.30 0.38  CREATININE 7.33*  --   --  8.33*  --  5.38*  TROPONINI 0.26*  --  0.25* 0.23*  --   --    < > = values in this interval not displayed.    Estimated Creatinine Clearance: 14.8 mL/min (A) (by C-G formula based on SCr of 5.38 mg/dL (H)).  Assessment: 39 yoM admitted for ACS workup, also in AFib. Initial heparin level undetectable, no issues with infusion per RN. Thrombocytopenia appears stable from last admit, HIT previously ruled out via SRA. 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 No bleeding noted  Hep lvl now within goal at 0.38  Goal of Therapy:  Heparin level 0.3-0.7 units/ml Monitor platelets by anticoagulation protocol: Yes   Plan:  Continue heparin at 1300 units/hr Daily HL CBC F/U cards plans and need for further heparin  Suhaas Agena A. Levada Dy, PharmD, [place] Please utilize Amion for appropriate phone number to reach the unit pharmacist ([place])    12/25/2018 9:15 AM

## 2018-12-25 NOTE — Progress Notes (Signed)
D/w Dr Benay Spice - he does not believe there is an increased thrombotic risk with ITP - thought it was reasonable to stop aspirin given plt ct <50K. Still suspects chronic ITP - however, noted platelet count was low normal on admission and then declined - HITT negative previously, he said unlikely this has developed now. Still not able to pursue PCI due to inability to give antiplatelets for stent.  Pixie Casino, MD, [facility], [place] Director of the Advanced Lipid Disorders &  Cardiovascular Risk Reduction Clinic Diplomate of the American Board of Clinical Lipidology Attending Cardiologist  Direct Dial: [phone]  Fax: [phone]  Website:  [email or web address]

## 2018-12-25 NOTE — Progress Notes (Addendum)
PROGRESS NOTE    Larry Klein  KVQ:[id number] DOB: 1956-08-06 DOA: 12/23/2018 PCP: Patient, No Pcp Per   Brief Narrative:  HPI per Dr. Dessa Phi on 12/23/2018 Larry Klein is a 63 y.o. male with medical history significant of CAD, COPD, hypertension, ESRD on dialysis.  He reports chest pressure and tightness across his entire chest as well as dyspnea with exertion over the past few days.  This is constant in nature.  He denies any fevers or cough.  Had some nausea without vomiting, no diarrhea.  No worsening peripheral edema from his baseline.  He presented to [facility], underwent CTA chest which was negative for PE or any acute findings.  ED physician discussed with [name] cardiology, was recommended to transfer to [facility] and start on IV heparin.  Patient was apparently also tested for low risk COVID-19 rule out.  Summary of [name] ED work up  EKG from [name] with A fib, rate 100s, PVC, no ST change WBC 10.2, Hgb 12.9, platelet 83, INR 1.6 ABG 7.51 / 27 / 99  Na 133, K 4.7, Cr 5.4, proBNP 66100 AST 355, ALT 295 CRP 41.4 Procalcitonin 0.52 Ferritin 938 Troponin 0.27 CXR stable cardiac prominence and postop change, stable aortic tortuosity, no edema or consolidation, stable central catheter positioning, no pneumothorax  CTA chest no PE, tortuous aorta, recommend annual imaging follow up, enlargement main pulmonary outflow tract, right pleural effusion   **Interim History  Continued to have Chest Pain and Cardiology considering cath in the patient but have decided he is not a candidate due to his Chronic Thrombocytopenia.  Cardiology added Imdur patient was dialyzed yesterday and had 3 Liters removed.  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 He continues to be on a Heparin gtt.   Assessment & Plan:   Principal Problem:   Chest pain Active Problems:   Dyspnea   ESRD (end stage  renal disease) on dialysis [facility])   Atrial fibrillation with RVR (HCC)   Acute on chronic systolic heart failure (HCC)   Cardiomyopathy (HCC)  Chest Pain r/o ACS, improving  -He was started on IV heparin at [name] prior to transfer and will Continue -Trended Troponin and went from 0.26 -> 0.25 -> 0.23 -ASA 81 mg po Daily being stopped by Cardiology -C/w Atorvastatin 40 mg po qHS, Metoprolol Succinate 50 mg po Daily -Cardiology consulted  And they added Isosorbide Mononitrate 30 mg po Daily for symptom relief  -Had a Recent Stress Test that demonstrated small area of apical ischemia and Moderate-sized inferobasal scar -C/w Heparin gtt for now per Cardiology [name] (He felt there was increased thrombotic risk with ITP and being off of ASA but Discussed with [name] and he stated that is not the case with ITP); Will defer ASA resumption to Cardiology   -[name] had a CABG x4 in 2017 -Cardiology considering Cardiac Catheterization but now feel he is not a Candidate given Chronic ITP as he will not tolerate DAPT  Persistent Atrial Fibrillation with [facility] -Not on Chronic Anticoagulation due to chronic thrombocytopenia  -C/w Amiodarone 200 mg po Daily and Metoprolol Succinate 50 mg po Daily for now -Currently on Heparin gtt as above  -C/w Metoprolol Tartrate 5 mg IV q49min PRN for HR>130  Acute on Chronic systolic CHF -Recent EF 64-33% on ECHO on 12/08/2018 -Will need Dialysis with additional ultrafiltration given volume overload for CHF Management -Cardiology Consulted for further Evaluation  and recommendations -Per Cardiology may need Repeat Cardiac Catheterization -C/w Metoprolol Succinate 50 mg po Daily  -No ACE/ARB due to Renal Fxn -Strict I's and O's, Daily Weights, and Fluid Restrict to 1200 mL -Patient is -2.632 Liters and Weight is down 9 lbs since admission  -Continue to Monitor Volume Status Carefully -Now Patient on Isosorbide Mononitrate per Cardiology  COPD -Currently  Without Exacerbation -C/w Albuterol IH 2 puff IH q6hprn for Wheezing and C/w Anoro Ellipta IH 1 puff into the lungs   ESRD on dialysis TTS  Hyperkalemia -Last Hemodialysis was 4/7 -Nephrology consulted for Maintenance of HD; They are planning on Dialyzing the patient on 12/26/2018 -BUN/Cr went from 44/7.33 -> 52/8.33 -> 24/5.38 -K+ was mild and went from 4.7 -> 5.3 -> 3.7 -K+ was mild and went from 4.7 -> 5.3 -Continue to Monitor and Trend Renal Fxn  -Repeat CMP in AM   Chronic Thrombocytopenia -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 [name] -HIT Ab and SRA Negative last admission -Platelet Count went from 56 -> 57 -> 42 -Discussed case with Hematology 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 Obtained Smear -Last Smear obtained in January was reviewed by Dr. [name] and showed "Platelet morphology appeared normal.  No schistocytes.  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   -Currently There is no indication for treatment in the absence of bleeding and with a stable platelet count above 50,000. [name] recommends no Steroids, or IVIG at this time  -Continue to Monitor for S/Sx of Bleeding -Had been referred to outpatient Hematology in [place] but never been seen in the outpatient setting   COVID-19 rule out -Low risk, on room air,  -Continue Droplet/Contact precautions as patient is ruling out.  -Lab at [place] pending still -Follow up SARS-CoV-2  Abnormal LFT's/Elevated LFTs -Patient's AST this AM was 107 and ALT was 228 -Check Acute Hepatitis Panel and RUQ U/S -Continue to Monitor and Trend Liver Function -Repeat CMP in AM   Normocytic Anemia/Anemia of Chronic Kidney Disease -Patient's Hb/Hct went from 11.9/37.1 -> 11.9/37.7 ->  12.0/37.9 -Check Anemia Panel in the AM  -Continue to Monitor for S/Sx of Bleeding -Repeat CBC in AM   Hyperphosphatemia -Patient's Phos Level this AM was 4.9 -In the setting of Renal Failure -Continue to Monitor and Trend -Repeat Phos Level in AM  PPE worn by physician: Pincus Badder, Gown, Gloves, Shoe cover and hair bouffant.  DVT prophylaxis: Anticoagulated with Heparin gtt Code Status: FULL CODE Family Communication: No family present at bedside Disposition Plan: Remain Inpatient for continued workup and treatment   Consultants:   Cardiology  Nephrology  Discussed Case with Hematology    Procedures: None   Antimicrobials:  Anti-infectives (From admission, onward)   None     Subjective: Seen and examined at bedside and states his chest pain and improved but he just felt like a pressure today.  Also he said his shortness of breath is improved as well but still has some swollen legs.  No lightheadedness or dizziness.  No other concerns or complaints at this time.  Objective: Vitals:   12/24/18 2339 12/25/18 0050 12/25/18 0733 12/25/18 0949  BP: 113/78 (!) 139/121 (!) 136/125 (!) 138/94  Pulse: 72 88 (!) 57 60  Resp: 18  18   Temp: 97.8 F (  36.6 C) 97.8 F (36.6 C)    TempSrc: Oral Oral    SpO2: 97%  95%   Weight: 88.1 kg     Height:        Intake/Output Summary (Last 24 hours) at 12/25/2018 1332 Last data filed at 12/24/2018 2339 Gross per 24 hour  Intake 360 ml  Output 3000 ml  Net -2640 ml   Filed Weights   12/23/18 1120 12/24/18 1930 12/24/18 2339  Weight: 92.4 kg 92.1 kg 88.1 kg   Examination: Physical Exam:  Constitutional: Well-nourished, well-developed overweight Caucasian male currently no acute distress appears calm and more comfortable today Eyes: Lids and conjunctive are normal.  Sclera anicteric ENMT: External ears nose appear normal.  Grossly normal hearing Neck: Appears supple no JVD Respiratory: Diminished auscultation  bilaterally no appreciable wheezing, rales, rhonchi.  Patient when tachypneic or using any accessory muscles to breathe Cardiovascular: Irregularly irregular.  No appreciable murmurs, rubs, gallops.  Has 1-2+ lower extremity edema worse on the left compared to the right Abdomen: Soft, nontender, slightly distended.  Bowel sounds present GU: Deferred Musculoskeletal: No appreciable contractures or cyanosis.  No joint deformities in the upper and lower extremities. Skin: Has bruising in skin discoloration on arms and legs.  No appreciable induration.  Skin is warm and dry Neurologic: Cranial nerves II through XII grossly intact no appreciable focal deficits.  Romberg sign cerebellar reflexes were not assessed. Psychiatric: Normal judgment and insight.  Patient is awake, alert and oriented x3.  Has a normal mood and affect.  Data Reviewed: I have personally reviewed following labs and imaging studies  CBC: Recent Labs  Lab 12/23/18 1351 12/24/18 0218 12/25/18 0306  WBC 9.7 10.3 9.0  NEUTROABS  --   --  7.2  HGB 11.9* 11.9* 12.0*  HCT 37.1* 37.7* 37.9*  MCV 90.9 91.3 92.0  PLT 56* 57* 42*   Basic Metabolic Panel: Recent Labs  Lab 12/23/18 1351 12/24/18 0218 12/25/18 0306  NA 136 135 137  K 4.7 5.3* 3.7  CL 97* 98 99  CO2 18* 21* 24  GLUCOSE 81 101* 104*  BUN 44* 52* 24*  CREATININE 7.33* 8.33* 5.38*  CALCIUM 9.2 8.9 8.5*  MG  --   --  2.0  PHOS  --   --  4.9*   GFR: Estimated Creatinine Clearance: 14.8 mL/min (A) (by C-G formula based on SCr of 5.38 mg/dL (H)). Liver Function Tests: Recent Labs  Lab 12/25/18 0306  AST 107*  ALT 228*  ALKPHOS 105  BILITOT 1.2  PROT 5.7*  ALBUMIN 3.3*   No results for input(s): LIPASE, AMYLASE in the last 168 hours. No results for input(s): AMMONIA in the last 168 hours. Coagulation Profile: No results for input(s): INR, PROTIME in the last 168 hours. Cardiac Enzymes: Recent Labs  Lab 12/23/18 1351 12/23/18 1944 12/24/18 0218   TROPONINI 0.26* 0.25* 0.23*   BNP (last 3 results) No results for input(s): PROBNP in the last 8760 hours. HbA1C: No results for input(s): HGBA1C in the last 72 hours. CBG: No results for input(s): GLUCAP in the last 168 hours. Lipid Profile: No results for input(s): CHOL, HDL, LDLCALC, TRIG, CHOLHDL, LDLDIRECT in the last 72 hours. Thyroid Function Tests: Recent Labs    12/24/18 0218  TSH 2.734   Anemia Panel: No results for input(s): VITAMINB12, FOLATE, FERRITIN, TIBC, IRON, RETICCTPCT in the last 72 hours. Sepsis Labs: No results for input(s): PROCALCITON, LATICACIDVEN in the last 168 hours.  No results found for this  or any previous visit (from the past 240 hour(s)).    Radiology Studies: No results found. Scheduled Meds: . amiodarone  200 mg Oral Daily  . atorvastatin  40 mg Oral QHS  . Chlorhexidine Gluconate Cloth  6 each Topical Q0600  . isosorbide mononitrate  30 mg Oral Daily  . metoprolol succinate  50 mg Oral Daily  . multivitamin  1 tablet Oral Daily  . pantoprazole  40 mg Oral QAC breakfast  . sodium chloride flush  3 mL Intravenous Q12H  . umeclidinium-vilanterol  1 puff Inhalation Daily   Continuous Infusions: . sodium chloride    . sodium chloride    . sodium chloride    . heparin 1,300 Units/hr (12/25/18 1048)    LOS: 1 day   Kerney Elbe, DO [facility] PAGER is on [place]  If 7PM-7AM, please contact night-coverage [email or web address] Password [email or web address] 12/25/2018, 1:32 PM

## 2018-12-25 NOTE — Progress Notes (Signed)
[facility] ROUNDING NOTE   Subjective:   This is a 89 gentleman Tuesday Thursday Saturday dialysis at [facility] kidney center.  He has history of coronary disease and COPD he was admitted with chest pain and is ruled out.  He also is rule out COVID-19 and is low risk  Blood pressure 914/ systolic pulse of 57 temperature 97.8 O2 sats 95% room air.  Underwent successful dialysis with 3 L ultrafiltered 12/24/2018  Sodium 137 potassium 3.7 chloride 99 CO2 24 BUN 24 creatinine 5.38 glucose 104 calcium 8.5 phosphorus 4.9 magnesium 2.0 albumin 3.3 AST 107 ALT 228 WBC 9.0 hemoglobin 12.0 platelets 42  Medications amiodarone 200 mg daily, atorvastatin 40 mg daily, aspirin 81 mg daily, Toprol-XL 50 mg daily, Protonix 40 mg daily, heparin IV,  Objective:  Vital signs in last 24 hours:  Temp:  [97.8 F (36.6 C)] 97.8 F (36.6 C) (04/10 0050) Pulse Rate:  [52-88] 57 (04/10 0733) Resp:  [18] 18 (04/10 0733) BP: (106-139)/(74-125) 136/125 (04/10 0733) SpO2:  [95 %-97 %] 95 % (04/10 0733) Weight:  [88.1 kg-92.1 kg] 88.1 kg (04/09 2339)  Weight change: -0.3 kg Filed Weights   12/23/18 1120 12/24/18 1930 12/24/18 2339  Weight: 92.4 kg 92.1 kg 88.1 kg    Intake/Output: I/O last 3 completed shifts: In: 360 [P.O.:360] Out: 3000 [Other:3000]   Intake/Output this shift:  No intake/output data recorded.  CVS-regular rate and rhythm with no murmurs rubs or gallops RS- CTA no wheezes or rales left IJ cath left AV graft ABD- BS present soft non-distended EXT- no edema   Basic Metabolic Panel: Recent Labs  Lab 12/23/18 1351 12/24/18 0218 12/25/18 0306  NA 136 135 137  K 4.7 5.3* 3.7  CL 97* 98 99  CO2 18* 21* 24  GLUCOSE 81 101* 104*  BUN 44* 52* 24*  CREATININE 7.33* 8.33* 5.38*  CALCIUM 9.2 8.9 8.5*  MG  --   --  2.0  PHOS  --   --  4.9*    Liver Function Tests: Recent Labs  Lab 12/25/18 0306  AST 107*  ALT 228*  ALKPHOS 105  BILITOT 1.2  PROT 5.7*  ALBUMIN  3.3*   No results for input(s): LIPASE, AMYLASE in the last 168 hours. No results for input(s): AMMONIA in the last 168 hours.  CBC: Recent Labs  Lab 12/23/18 1351 12/24/18 0218 12/25/18 0306  WBC 9.7 10.3 9.0  NEUTROABS  --   --  7.2  HGB 11.9* 11.9* 12.0*  HCT 37.1* 37.7* 37.9*  MCV 90.9 91.3 92.0  PLT 56* 57* 42*    Cardiac Enzymes: Recent Labs  Lab 12/23/18 1351 12/23/18 1944 12/24/18 0218  TROPONINI 0.26* 0.25* 0.23*    BNP: Invalid input(s): POCBNP  CBG: No results for input(s): GLUCAP in the last 168 hours.  Microbiology: Results for orders placed or performed during the hospital encounter of 12/07/18  MRSA PCR Screening     Status: None   Collection Time: 12/07/18  9:07 PM  Result Value Ref Range Status   MRSA by PCR NEGATIVE NEGATIVE Final    Comment:        The GeneXpert MRSA Assay (FDA approved for NASAL specimens only), is one component of a comprehensive MRSA colonization surveillance program. It is not intended to diagnose MRSA infection nor to guide or monitor treatment for MRSA infections. Performed at [facility], [place] [address], [place], [place] [address]     Coagulation Studies: No results for input(s):  LABPROT, INR in the last 72 hours.  Urinalysis: No results for input(s): COLORURINE, LABSPEC, PHURINE, GLUCOSEU, HGBUR, BILIRUBINUR, KETONESUR, PROTEINUR, UROBILINOGEN, NITRITE, LEUKOCYTESUR in the last 72 hours.  Invalid input(s): APPERANCEUR    Imaging: No results found.   Medications:   . sodium chloride    . sodium chloride    . sodium chloride    . heparin 1,300 Units/hr (12/24/18 0251)   . amiodarone  200 mg Oral Daily  . aspirin  81 mg Oral Daily  . atorvastatin  40 mg Oral QHS  . Chlorhexidine Gluconate Cloth  6 each Topical Q0600  . isosorbide mononitrate  30 mg Oral Daily  . metoprolol succinate  50 mg Oral Daily  . multivitamin  1 tablet Oral Daily  . pantoprazole  40 mg Oral QAC breakfast  . sodium  chloride flush  3 mL Intravenous Q12H  . umeclidinium-vilanterol  1 puff Inhalation Daily   sodium chloride, sodium chloride, sodium chloride, acetaminophen **OR** acetaminophen, albuterol, alteplase, heparin, HYDROcodone-acetaminophen, metoprolol tartrate, ondansetron **OR** ondansetron (ZOFRAN) IV, sodium chloride flush  Assessment/ Plan:   ESRD-Monday and Tuesday Thursday Saturday dialysis plan dialysis treatment for 12/26/2018.  Chest pain appreciate assistance from [name].  Concerned about the cardiac catheterization in setting of thrombocytopenia.  Had recent Myoview with fixed defect.  History of CABG 2017.  No plans to proceed with cath and PCI at this time.  This is due to the thrombocytopenia risk of bleeding  Congestive heart failure with severely reduced systolic function 20 to 58% EF.  Thrombocytopenia appears to be chronic and has been present since January he had a heparin-induced antibody that was negative in 12/11/2018.  Appreciate assistance from 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  He can be followed up as an outpatient.  Anemia stable does not appear to be an issue at this time no ESA's as an outpatient  Metabolic bone disease phosphorus appears to be in good control will continue to follow  Hyperlipidemia continues on atorvastatin  GERD continues on Protonix  Atrial fibrillation rate controlled no anticoagulation secondary thrombocytopenia TSH 2.734   LOS: 1 Sherril Croon @TODAY @9 :07 AM

## 2018-12-26 ENCOUNTER — Inpatient Hospital Stay (HOSPITAL_COMMUNITY): Payer: Medicare Other

## 2018-12-26 LAB — CBC WITH DIFFERENTIAL/PLATELET
Abs Immature Granulocytes: 0.04 10*3/uL (ref 0.00–0.07)
Basophils Absolute: 0 10*3/uL (ref 0.0–0.1)
Basophils Relative: 1 %
Eosinophils Absolute: 0.4 10*3/uL (ref 0.0–0.5)
Eosinophils Relative: 5 %
HCT: 38 % — ABNORMAL LOW (ref 39.0–52.0)
Hemoglobin: 11.3 g/dL — ABNORMAL LOW (ref 13.0–17.0)
Immature Granulocytes: 1 %
Lymphocytes Relative: 11 %
Lymphs Abs: 0.8 10*3/uL (ref 0.7–4.0)
MCH: 28.1 pg (ref 26.0–34.0)
MCHC: 29.7 g/dL — ABNORMAL LOW (ref 30.0–36.0)
MCV: 94.5 fL (ref 80.0–100.0)
Monocytes Absolute: 0.7 10*3/uL (ref 0.1–1.0)
Monocytes Relative: 9 %
Neutro Abs: 5.3 10*3/uL (ref 1.7–7.7)
Neutrophils Relative %: 73 %
Platelets: 49 10*3/uL — ABNORMAL LOW (ref 150–400)
RBC: 4.02 MIL/uL — ABNORMAL LOW (ref 4.22–5.81)
RDW: 21 % — ABNORMAL HIGH (ref 11.5–15.5)
WBC: 7.2 10*3/uL (ref 4.0–10.5)
nRBC: 0 % (ref 0.0–0.2)

## 2018-12-26 LAB — COMPREHENSIVE METABOLIC PANEL
ALT: 228 U/L — ABNORMAL HIGH (ref 0–44)
ALT: 164 U/L — ABNORMAL HIGH (ref 0–44)
AST: 107 U/L — ABNORMAL HIGH (ref 15–41)
AST: 60 U/L — ABNORMAL HIGH (ref 15–41)
Albumin: 3.3 g/dL — ABNORMAL LOW (ref 3.5–5.0)
Albumin: 3.3 g/dL — ABNORMAL LOW (ref 3.5–5.0)
Alkaline Phosphatase: 105 U/L (ref 38–126)
Alkaline Phosphatase: 95 U/L (ref 38–126)
Anion gap: 14 (ref 5–15)
Anion gap: 15 (ref 5–15)
BUN: 24 mg/dL — ABNORMAL HIGH (ref 8–23)
BUN: 37 mg/dL — ABNORMAL HIGH (ref 8–23)
CO2: 24 mmol/L (ref 22–32)
CO2: 26 mmol/L (ref 22–32)
Calcium: 8.5 mg/dL — ABNORMAL LOW (ref 8.9–10.3)
Calcium: 8.8 mg/dL — ABNORMAL LOW (ref 8.9–10.3)
Chloride: 99 mmol/L (ref 98–111)
Chloride: 99 mmol/L (ref 98–111)
Creatinine, Ser: 5.38 mg/dL — ABNORMAL HIGH (ref 0.61–1.24)
Creatinine, Ser: 7.73 mg/dL — ABNORMAL HIGH (ref 0.61–1.24)
GFR calc Af Amer: 12 mL/min — ABNORMAL LOW (ref >60)
GFR calc Af Amer: 8 mL/min — ABNORMAL LOW (ref >60)
GFR calc non Af Amer: 10 mL/min — ABNORMAL LOW (ref >60)
GFR calc non Af Amer: 7 mL/min — ABNORMAL LOW (ref >60)
Glucose, Bld: 104 mg/dL — ABNORMAL HIGH (ref 70–99)
Glucose, Bld: 110 mg/dL — ABNORMAL HIGH (ref 70–99)
Potassium: 3.7 mmol/L (ref 3.5–5.1)
Potassium: 4.8 mmol/L (ref 3.5–5.1)
Sodium: 137 mmol/L (ref 135–145)
Sodium: 140 mmol/L (ref 135–145)
Total Bilirubin: 1.2 mg/dL (ref 0.3–1.2)
Total Bilirubin: 0.9 mg/dL (ref 0.3–1.2)
Total Protein: 5.7 g/dL — ABNORMAL LOW (ref 6.5–8.1)
Total Protein: 5.4 g/dL — ABNORMAL LOW (ref 6.5–8.1)

## 2018-12-26 LAB — MAGNESIUM: Magnesium: 2.2 mg/dL (ref 1.7–2.4)

## 2018-12-26 LAB — HEPARIN LEVEL (UNFRACTIONATED): Heparin Unfractionated: 0.35 IU/mL (ref 0.30–0.70)

## 2018-12-26 LAB — PHOSPHORUS: Phosphorus: 6.6 mg/dL — ABNORMAL HIGH (ref 2.5–4.6)

## 2018-12-26 MED ORDER — HEPARIN SODIUM (PORCINE) 1000 UNIT/ML IJ SOLN
4000.0000 [IU] | Freq: Once | INTRAMUSCULAR | Status: AC
  Administered 2018-12-26: 4000 [IU]
  Filled 2018-12-26: qty 4

## 2018-12-26 MED ORDER — HEPARIN SODIUM (PORCINE) 1000 UNIT/ML IJ SOLN
INTRAMUSCULAR | Status: AC
  Filled 2018-12-26: qty 4

## 2018-12-26 NOTE — Progress Notes (Signed)
[facility] ROUNDING NOTE   Subjective:   This is a 75 gentleman Tuesday Thursday Saturday dialysis at [facility] kidney center.  He has history of coronary disease and COPD he was admitted with chest pain and is ruled out.  He also is rule out COVID-19 and is low risk.  Results still pending  Blood pressure 96/82 pulse 87 temperature 97.6 O2 sats 96% room air  Sodium 140 potassium 4.8 chloride 99 CO2 26 BUN 37 creatinine 7.7 glucose 110 calcium 8.8 magnesium 2.2 phosphorus 6.6 albumin 3.3 WBC 7.7 hemoglobin 11.3 platelets 49  Medications amiodarone 200 mg daily, atorvastatin 40 mg daily, aspirin 81 mg daily, Toprol-XL 50 mg daily, Protonix 40 mg daily, heparin IV, Imdur 30 mg daily  Objective:  Vital signs in last 24 hours:  Temp:  [97.6 F (36.4 C)] 97.6 F (36.4 C) (04/11 0744) Pulse Rate:  [60-81] 81 (04/11 0744) Resp:  [18] 18 (04/10 2100) BP: (96-138)/(71-94) 96/82 (04/11 0744) SpO2:  [95 %-96 %] 96 % (04/11 0744)  Weight change:  Filed Weights   12/23/18 1120 12/24/18 1930 12/24/18 2339  Weight: 92.4 kg 92.1 kg 88.1 kg    Intake/Output: I/O last 3 completed shifts: In: 769.8 [I.V.:769.8] Out: 3000 [Other:3000]   Intake/Output this shift:  No intake/output data recorded.  CVS-regular rate and rhythm with no murmurs rubs or gallops RS- CTA no wheezes or rales left IJ cath left AV graft ABD- BS present soft non-distended EXT- no edema   Basic Metabolic Panel: Recent Labs  Lab 12/23/18 1351 12/24/18 0218 12/25/18 0306 12/26/18 0707  NA 136 135 137 140  K 4.7 5.3* 3.7 4.8  CL 97* 98 99 99  CO2 18* 21* 24 26  GLUCOSE 81 101* 104* 110*  BUN 44* 52* 24* 37*  CREATININE 7.33* 8.33* 5.38* 7.73*  CALCIUM 9.2 8.9 8.5* 8.8*  MG  --   --  2.0 2.2  PHOS  --   --  4.9* 6.6*    Liver Function Tests: Recent Labs  Lab 12/25/18 0306 12/26/18 0707  AST 107* 60*  ALT 228* 164*  ALKPHOS 105 95  BILITOT 1.2 0.9  PROT 5.7* 5.4*  ALBUMIN 3.3* 3.3*    No results for input(s): LIPASE, AMYLASE in the last 168 hours. No results for input(s): AMMONIA in the last 168 hours.  CBC: Recent Labs  Lab 12/23/18 1351 12/24/18 0218 12/25/18 0306 12/26/18 0707  WBC 9.7 10.3 9.0 7.2  NEUTROABS  --   --  7.2 5.3  HGB 11.9* 11.9* 12.0* 11.3*  HCT 37.1* 37.7* 37.9* 38.0*  MCV 90.9 91.3 92.0 94.5  PLT 56* 57* 42* 49*    Cardiac Enzymes: Recent Labs  Lab 12/23/18 1351 12/23/18 1944 12/24/18 0218  TROPONINI 0.26* 0.25* 0.23*    BNP: Invalid input(s): POCBNP  CBG: No results for input(s): GLUCAP in the last 168 hours.  Microbiology: Results for orders placed or performed during the hospital encounter of 12/07/18  MRSA PCR Screening     Status: None   Collection Time: 12/07/18  9:07 PM  Result Value Ref Range Status   MRSA by PCR NEGATIVE NEGATIVE Final    Comment:        The GeneXpert MRSA Assay (FDA approved for NASAL specimens only), is one component of a comprehensive MRSA colonization surveillance program. It is not intended to diagnose MRSA infection nor to guide or monitor treatment for MRSA infections. Performed at [facility], [place] [address] [place], [place]  [address]     Coagulation Studies: No results for input(s): LABPROT, INR in the last 72 hours.  Urinalysis: No results for input(s): COLORURINE, LABSPEC, PHURINE, GLUCOSEU, HGBUR, BILIRUBINUR, KETONESUR, PROTEINUR, UROBILINOGEN, NITRITE, LEUKOCYTESUR in the last 72 hours.  Invalid input(s): APPERANCEUR    Imaging: No results found.   Medications:   . sodium chloride    . sodium chloride    . sodium chloride    . heparin 1,300 Units/hr (12/25/18 1048)   . amiodarone  200 mg Oral Daily  . atorvastatin  40 mg Oral QHS  . Chlorhexidine Gluconate Cloth  6 each Topical Q0600  . isosorbide mononitrate  30 mg Oral Daily  . metoprolol succinate  50 mg Oral Daily  . multivitamin  1 tablet Oral Daily  . pantoprazole  40 mg Oral QAC breakfast   . sodium chloride flush  3 mL Intravenous Q12H  . umeclidinium-vilanterol  1 puff Inhalation Daily   sodium chloride, sodium chloride, sodium chloride, acetaminophen **OR** acetaminophen, albuterol, alteplase, heparin, HYDROcodone-acetaminophen, metoprolol tartrate, ondansetron **OR** ondansetron (ZOFRAN) IV, sodium chloride flush  Assessment/ Plan:   ESRD-Monday and Tuesday Thursday Saturday dialysis plan dialysis treatment for 12/26/2018.  Tolerated 3 L removed 12/24/2018  Chest pain appreciate assistance from [name].  Concerned about the cardiac catheterization in setting of thrombocytopenia.  Had recent Myoview with fixed defect.  History of CABG 2017.  No plans to proceed with cath and PCI at this time.  This is due to the thrombocytopenia risk of bleeding.  Have added Imdur 30 mg daily  Congestive heart failure with severely reduced systolic function 20 to 11% EF.  Thrombocytopenia appears to be chronic and has been present since January he had a heparin-induced antibody that was negative in 12/11/2018.  Appreciate assistance from 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  He can be followed up as an outpatient.  Hypotension.  This may limit ultrafiltration patient has had Imdur added was also on the beta-blocker metoprolol.  We may have to modify these medications unfortunately due to hypotension  Anemia stable does not appear to be an issue at this time no ESA's as an outpatient  Metabolic bone disease phosphorus appears to be in good control will continue to follow  Hyperlipidemia continues on atorvastatin  GERD continues on Protonix  Atrial fibrillation rate controlled no anticoagulation secondary thrombocytopenia TSH 2.734    LOS: 2 Sherril Croon @TODAY @8 :49 AM

## 2018-12-26 NOTE — Progress Notes (Signed)
Progress Note  Patient Name: Larry Klein Date of Encounter: 12/26/2018  Primary Cardiologist: Jenne Campus, MD   Due to the current COVID-19 pandemic and the desire to minimize use of PPE and reduce provider exposure, the patient exam was deferred or conducted via video or teleconference.  Subjective   Currently on HD, just started. No angina at this time, but did have some tightness last night  Inpatient Medications    Scheduled Meds: . amiodarone  200 mg Oral Daily  . atorvastatin  40 mg Oral QHS  . Chlorhexidine Gluconate Cloth  6 each Topical Q0600  . heparin      . isosorbide mononitrate  30 mg Oral Daily  . metoprolol succinate  50 mg Oral Daily  . multivitamin  1 tablet Oral Daily  . pantoprazole  40 mg Oral QAC breakfast  . sodium chloride flush  3 mL Intravenous Q12H  . umeclidinium-vilanterol  1 puff Inhalation Daily   Continuous Infusions: . sodium chloride    . sodium chloride    . sodium chloride    . heparin 1,300 Units/hr (12/25/18 1048)   PRN Meds: sodium chloride, sodium chloride, sodium chloride, acetaminophen **OR** acetaminophen, albuterol, alteplase, heparin, HYDROcodone-acetaminophen, metoprolol tartrate, ondansetron **OR** ondansetron (ZOFRAN) IV, sodium chloride flush   Vital Signs    Vitals:   12/25/18 2100 12/26/18 0744 12/26/18 1000 12/26/18 1012  BP: 101/76 96/82 97/64  106/83  Pulse: 66 81 66 64  Resp: 18  18 18   Temp: 97.6 F (36.4 C) 97.6 F (36.4 C)  97.6 F (36.4 C)  TempSrc: Oral Oral  Oral  SpO2: 95% 96% 96%   Weight:   90 kg   Height:        Intake/Output Summary (Last 24 hours) at 12/26/2018 1054 Last data filed at 12/26/2018 0400 Gross per 24 hour  Intake 769.84 ml  Output -  Net 769.84 ml   Last 3 Weights 12/26/2018 12/24/2018 12/24/2018  Weight (lbs) 198 lb 6.6 oz 194 lb 3.6 oz 203 lb 0.7 oz  Weight (kg) 90 kg 88.1 kg 92.1 kg      Telemetry    AFib, rate is controlled - Personally Reviewed  ECG    AFib,  RBBB, no acute ischemic changes - Personally Reviewed  Physical Exam  N/A   Labs    Chemistry Recent Labs  Lab 12/24/18 0218 12/25/18 0306 12/26/18 0707  NA 135 137 140  K 5.3* 3.7 4.8  CL 98 99 99  CO2 21* 24 26  GLUCOSE 101* 104* 110*  BUN 52* 24* 37*  CREATININE 8.33* 5.38* 7.73*  CALCIUM 8.9 8.5* 8.8*  PROT  --  5.7* 5.4*  ALBUMIN  --  3.3* 3.3*  AST  --  107* 60*  ALT  --  228* 164*  ALKPHOS  --  105 95  BILITOT  --  1.2 0.9  GFRNONAA 6* 10* 7*  GFRAA 7* 12* 8*  ANIONGAP 16* 14 15     Hematology Recent Labs  Lab 12/24/18 0218 12/25/18 0306 12/26/18 0707  WBC 10.3 9.0 7.2  RBC 4.13* 4.12* 4.02*  HGB 11.9* 12.0* 11.3*  HCT 37.7* 37.9* 38.0*  MCV 91.3 92.0 94.5  MCH 28.8 29.1 28.1  MCHC 31.6 31.7 29.7*  RDW 20.9* 21.1* 21.0*  PLT 57* 42* 49*    Cardiac Enzymes Recent Labs  Lab 12/23/18 1351 12/23/18 1944 12/24/18 0218  TROPONINI 0.26* 0.25* 0.23*   No results for input(s): TROPIPOC in the last 168 hours.  BNPNo results for input(s): BNP, PROBNP in the last 168 hours.   DDimer No results for input(s): DDIMER in the last 168 hours.   Radiology    No results found.  Cardiac Studies   12/08/2018 ECHO  1. The left ventricle has severely reduced systolic function, with an ejection fraction of 20-25%. The cavity size was moderately dilated. There is mildly increased left ventricular wall thickness. Left ventricular diastolic function could not be  evaluated secondary to atrial fibrillation. Left ventricular diffuse hypokinesis.  2. The right ventricle has mildly reduced systolic function. The cavity was mildly enlarged. There is no increase in right ventricular wall thickness.  3. Left atrial size was moderately dilated.  4. Right atrial size was moderately dilated.  5. The mitral valve is degenerative. Moderate thickening of the mitral valve leaflet. Mitral valve regurgitation is moderate to severe by color flow Doppler. The MR jet is  anteriorly-directed.  6. The tricuspid valve is grossly normal.  7. The aortic valve was not well visualized Moderate sclerosis of the aortic valve.  8. The inferior vena cava was dilated in size with <50% respiratory variability.  NUCLEAR STRESS 12/10/2018  No T wave inversion was noted during stress.  Defect 1: There is a medium defect of moderate severity present in the basal inferior and basal inferolateral location.  Defect 2: There is a small defect of moderate severity present in the apex location.  The left ventricular ejection fraction is severely decreased (<30%).  Findings consistent with ischemia and prior myocardial infarction.  This is a high risk study.   High risk pharmacological nuclear perfusion study due to severely depressed left ventricular systolic function. Perfusion images suggest a moderate-size inferobasal scar and a small area of apical ischemia, which is largely reversible. The degree of ventricular dysfunction is out of proportion to the area of the perfusion abnormalities, suggesting mixed etiology for the cardiomyopathy.  Patient Profile     63 y.o. 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 CAD/unstable angina: the ECG does not show new ischemic changes, the troponin elevation is mild and "flat", a very recent nuclear stress test only showed a small area of apical ischemia. Not a candidate for surgical or percutaneous revascularization (previous CABG, thrombocytopenia). Medical therapy is indicated, but is also somewhat limited due to low BP. On beta blocker and statin. Can try to increase isosorbide to 60 mg daily, if post-HD BP allows. 2. CHF (chronic  systolic): volume managed via HD. No overt symptoms of HF decompensation at this time.Meds limited by BP. 3. AFib: longstanding persistent, not anticoagulated due to platelet disorder. Rate controlled. On amiodarone and beta blocker.  [facility] will sign off.   Medication Recommendations:  As above, can increase isosorbide mononitrate to 60 mg daily if needed for chest discomfort control Other recommendations (labs, testing, etc):  n/a Follow up as an outpatient:  Has a 4/17 e-visit w Dr. Agustin Cree  For questions or updates, please contact [facility] Please consult [email or web address] for contact info under        Signed, Sanda Klein, MD  12/26/2018, 10:54 AM

## 2018-12-26 NOTE — Progress Notes (Signed)
PROGRESS NOTE    Larry Klein  BJY:[id number] DOB: 20-May-1956 DOA: 12/23/2018 PCP: Patient, No Pcp Per   Brief Narrative:  HPI per Dr. Dessa Phi on 12/23/2018 Larry Klein is a 63 y.o. male with medical history significant of CAD, COPD, hypertension, ESRD on dialysis.  He reports chest pressure and tightness across his entire chest as well as dyspnea with exertion over the past few days.  This is constant in nature.  He denies any fevers or cough.  Had some nausea without vomiting, no diarrhea.  No worsening peripheral edema from his baseline.  He presented to [facility], underwent CTA chest which was negative for PE or any acute findings.  ED physician discussed with [facility] cardiology, was recommended to transfer to [facility] and start on IV heparin.  Patient was apparently also tested for low risk COVID-19 rule out.  Summary of [facility] ED work up  EKG from [place] with A fib, rate 100s, PVC, no ST change WBC 10.2, Hgb 12.9, platelet 83, INR 1.6 ABG 7.51 / 27 / 99  Na 133, K 4.7, Cr 5.4, proBNP 66100 AST 355, ALT 295 CRP 41.4 Procalcitonin 0.52 Ferritin 938 Troponin 0.27 CXR stable cardiac prominence and postop change, stable aortic tortuosity, no edema or consolidation, stable central catheter positioning, no pneumothorax  CTA chest no PE, tortuous aorta, recommend annual imaging follow up, enlargement main pulmonary outflow tract, right pleural effusion   **Interim History  Continued to have Chest Pain and Cardiology was consulted and they were considering cath in the patient but have decided he is not a candidate due to his Chronic Thrombocytopenia.  Cardiology added Imdur patient was dialyzed the day before yesterday and had 3 Liters removed.  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 He continued to be on a Heparin gtt at this time and received Dialysis today. Discussed with Dr. Sallyanne Kuster  who recommended discontinuing the Heparin gtt. Dialysis limited by Hypotension today.   Assessment & Plan:   Principal Problem:   Chest pain Active Problems:   Dyspnea   ESRD (end stage renal disease) on dialysis [facility])   Atrial fibrillation with RVR (HCC)   Acute on chronic systolic heart failure (HCC)   Cardiomyopathy (HCC)  Chest Pain r/o ACS, improving  -He was started on IV heparin at [facility] ED prior to transfer and this is to be discontinued now  -Trended Troponin and went from 0.26 -> 0.25 -> 0.23 -ASA 81 mg po Daily being stopped by Cardiology -C/w Atorvastatin 40 mg po qHS, Metoprolol Succinate 50 mg po Daily -Cardiology consulted  And they added Isosorbide Mononitrate 30 mg po Daily for symptom relief but have to be careful due to it limiting UF for Dialysis due to Hypotension. Cardiology recommends to increase to 60 mg post-HD if BP allows but will keep at 30  -Had a Recent Stress Test that demonstrated small area of apical ischemia and Moderate-sized inferobasal scar -C/w Heparin gtt for now per Cardiology [name] (He felt there was increased thrombotic risk with ITP and being off of ASA but Discussed with [name] and he stated that is not the case with ITP); Will defer ASA resumption to Cardiology   -Larry Klein had a CABG x4 in 2017 -Cardiology considering Cardiac Catheterization but now feel he is not a Candidate given Chronic ITP as he will not tolerate DAPT -Will C/w BB and Statin and Isosorbide at D/C  -Cardiology  to follow up as an outpatient   Persistent Atrial Fibrillation with [facility] -Not on Chronic Anticoagulation due to chronic thrombocytopenia  -C/w Amiodarone 200 mg po Daily and Metoprolol Succinate 50 mg po Daily for now -Was on Heparin gtt as above but now stopped  -C/w Metoprolol Tartrate 5 mg IV q12min PRN for HR>130  Acute on Chronic systolic CHF -Recent EF 70-26% on ECHO on 12/08/2018 -Will need Dialysis with additional ultrafiltration given volume  overload for CHF Management -Cardiology Consulted for further Evaluation and recommendations -Per Cardiology may need Repeat Cardiac Catheterization -C/w Metoprolol Succinate 50 mg po Daily  -No ACE/ARB due to Renal Fxn -Strict I's and O's, Daily Weights, and Fluid Restrict to 1200 mL -Patient is -3.727 Liters and Weight is down 10 lbs since admission; Had 3 Liters removed on 12/24/2018 and 2 Liters today -Continue to Monitor Volume Status Carefully -Now Patient on Isosorbide Mononitrate per Cardiology and will continue at D/C  COPD -Currently Without Exacerbation -C/w Albuterol IH 2 puff IH q6hprn for Wheezing and C/w Anoro Ellipta IH 1 puff into the lungs   ESRD on dialysis TTS  Hyperkalemia -Last Hemodialysis was 4/7 -Nephrology consulted for Maintenance of HD; They are planning on Dialyzing the patient on 12/26/2018 -BUN/Cr went from 44/7.33 -> 52/8.33 -> 24/5.38 -> 37/7.73 -K+ was mild and went from 4.7 -> 5.3 -> 3.7 -> 4.8 -Continue to Monitor and Trend Renal Fxn  -Repeat CMP in AM   Chronic Thrombocytopenia -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 [name] -HIT Ab and SRA Negative last admission -Platelet Count went from 56 -> 57 -> 42 -> 49 -Discussed case with Hematology 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 Obtained Smear -Last Smear obtained in January was reviewed by Dr. [name] and showed "Platelet morphology appeared normal.  No schistocytes.  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   -Currently There is no indication for treatment in the absence of bleeding and with a stable platelet count above 50,000. [name] recommends no Steroids, or IVIG at this time  -Continue to Monitor for S/Sx of Bleeding -Had been referred to  outpatient Hematology in [place] but never been seen in the outpatient setting   COVID-19 Ruled Out -Low risk, on room air,  -Continued Droplet/Contact precautions as patient was ruling out.  -Lab at [place] reviewed  -Follow up SARS-CoV-2 that was done at [facility] was NEGATIVE and not Detected.  -D/C Droplet and Contact   Abnormal LFT's/Elevated LFTs, trending down slightly  -Patient's AST yesterday AM was 107 and ALT was 228 -Check Acute Hepatitis Panel and RUQ U/S -AST is now 60 and ALT is 164 -Continue to Monitor and Trend Liver Function -Repeat CMP in AM   Normocytic Anemia/Anemia of Chronic Kidney Disease -Patient's Hb/Hct went from 11.9/37.1 -> 11.9/37.7 -> 12.0/37.9 -> 11.3/38.0 -Check Anemia Panel in the AM  -Continue to Monitor for S/Sx of Bleeding -Repeat CBC in AM   Hypotension -BP was 97/71 -Now improved to 130/88 -C/w Antihypertensives as above if tolerated -Patient Dialyzed today   Hyperphosphatemia -Patient's Phos Level this AM was 6.6 -In the setting of Renal Failure; Patient getting Dialysis today  -Continue to Monitor and Trend -Repeat Phos Level in AM  PPE worn by physician: Pincus Badder, Gown, Gloves, and hair bouffant.  DVT  prophylaxis: Heparin gtt now stopped Code Status: FULL CODE Family Communication: No family present at bedside Disposition Plan: Remain Inpatient for continued workup and treatment: Pending Clearance by Nephro now that Cardiology has signed off; Patient to be transferred off of COVID Floor now that he is COVID-19 Negative.   Consultants:   Cardiology  Nephrology  Discussed Case with Hematology    Procedures: None   Antimicrobials:  Anti-infectives (From admission, onward)   None     Subjective: Seen and examined at bedside and having some chest tightness noted 4 out of 10 in severity.  Still has some leg swelling.  No nausea or vomiting.  Continues to have some bruising.  No other concerns or complaints at  this time and hoping that fluid will be removed with dialysis.  Objective: Vitals:   12/26/18 1300 12/26/18 1330 12/26/18 1400 12/26/18 1412  BP: 112/72 96/60 120/70 130/88  Pulse: 68 66 68 66  Resp: 16 16 16 18   Temp:    98 F (36.7 C)  TempSrc:    Oral  SpO2:    98%  Weight:    87.8 kg  Height:        Intake/Output Summary (Last 24 hours) at 12/26/2018 1742 Last data filed at 12/26/2018 1500 Gross per 24 hour  Intake 905.07 ml  Output 2000 ml  Net -1094.93 ml   Filed Weights   12/24/18 2339 12/26/18 1000 12/26/18 1412  Weight: 88.1 kg 90 kg 87.8 kg   Examination: Physical Exam:  Constitutional: Nourished, well-developed overweight Caucasian male currently no acute distress but still complaining of some mild chest discomfort and awaiting dialysis. Eyes: Conjunctive are normal.  Sclera anicteric. ENMT: Lids and conjunctive are normal.  Grossly normal hearing. Neck: Appears Supple with no JVD Respiratory: Diminished to auscultation bilaterally no appreciable wheezing, rales, car.  Patient not tachypneic or using accessory muscles to breathe Cardiovascular: Irregularly irregular.  Has no appreciable murmurs, rubs, gallops.  Has 1-2 extremity edema slightly worse on the left compared to right Abdomen: Soft, nontender, mildly distended. Bowel Sounds present GU: Deferred Musculoskeletal: No appreciable contractures or cyanosis.  No joint deformities upper lower extremities Skin: Has bruising and skin discoloration on his arms legs.  No appreciable induration.  Skin is warm and dry Neurologic: Cranial nerves II through XII grossly intact no appreciable focal deficits. Psychiatric: Normal judgment and insight.  Patient is slightly anxious.  He is awake and alert and oriented x3  Data Reviewed: I have personally reviewed following labs and imaging studies  CBC: Recent Labs  Lab 12/23/18 1351 12/24/18 0218 12/25/18 0306 12/26/18 0707  WBC 9.7 10.3 9.0 7.2  NEUTROABS  --   --   7.2 5.3  HGB 11.9* 11.9* 12.0* 11.3*  HCT 37.1* 37.7* 37.9* 38.0*  MCV 90.9 91.3 92.0 94.5  PLT 56* 57* 42* 49*   Basic Metabolic Panel: Recent Labs  Lab 12/23/18 1351 12/24/18 0218 12/25/18 0306 12/26/18 0707  NA 136 135 137 140  K 4.7 5.3* 3.7 4.8  CL 97* 98 99 99  CO2 18* 21* 24 26  GLUCOSE 81 101* 104* 110*  BUN 44* 52* 24* 37*  CREATININE 7.33* 8.33* 5.38* 7.73*  CALCIUM 9.2 8.9 8.5* 8.8*  MG  --   --  2.0 2.2  PHOS  --   --  4.9* 6.6*   GFR: Estimated Creatinine Clearance: 10.3 mL/min (A) (by C-G formula based on SCr of 7.73 mg/dL (H)). Liver Function Tests: Recent Labs  Lab 12/25/18  0518 12/26/18 0707  AST 107* 60*  ALT 228* 164*  ALKPHOS 105 95  BILITOT 1.2 0.9  PROT 5.7* 5.4*  ALBUMIN 3.3* 3.3*   No results for input(s): LIPASE, AMYLASE in the last 168 hours. No results for input(s): AMMONIA in the last 168 hours. Coagulation Profile: No results for input(s): INR, PROTIME in the last 168 hours. Cardiac Enzymes: Recent Labs  Lab 12/23/18 1351 12/23/18 1944 12/24/18 0218  TROPONINI 0.26* 0.25* 0.23*   BNP (last 3 results) No results for input(s): PROBNP in the last 8760 hours. HbA1C: No results for input(s): HGBA1C in the last 72 hours. CBG: No results for input(s): GLUCAP in the last 168 hours. Lipid Profile: No results for input(s): CHOL, HDL, LDLCALC, TRIG, CHOLHDL, LDLDIRECT in the last 72 hours. Thyroid Function Tests: Recent Labs    12/24/18 0218  TSH 2.734   Anemia Panel: No results for input(s): VITAMINB12, FOLATE, FERRITIN, TIBC, IRON, RETICCTPCT in the last 72 hours. Sepsis Labs: No results for input(s): PROCALCITON, LATICACIDVEN in the last 168 hours.  No results found for this or any previous visit (from the past 240 hour(s)).    Radiology Studies: No results found. Scheduled Meds: . amiodarone  200 mg Oral Daily  . atorvastatin  40 mg Oral QHS  . Chlorhexidine Gluconate Cloth  6 each Topical Q0600  . isosorbide  mononitrate  30 mg Oral Daily  . metoprolol succinate  50 mg Oral Daily  . multivitamin  1 tablet Oral Daily  . pantoprazole  40 mg Oral QAC breakfast  . sodium chloride flush  3 mL Intravenous Q12H  . umeclidinium-vilanterol  1 puff Inhalation Daily   Continuous Infusions: . sodium chloride    . sodium chloride    . sodium chloride    . heparin 1,300 Units/hr (12/25/18 1048)    LOS: 2 days   Kerney Elbe, DO [facility] PAGER is on [place]  If 7PM-7AM, please contact night-coverage [email or web address] Password [facility] 12/26/2018, 5:42 PM

## 2018-12-26 NOTE — Progress Notes (Signed)
ANTICOAGULATION CONSULT NOTE - Follow-Up Consult  Pharmacy Consult for heparin Indication: chest pain/ACS  No Active Allergies  Patient Measurements: Height: 5\' 11"  (180.3 cm) Weight: 194 lb 3.6 oz (88.1 kg) IBW/kg (Calculated) : 75.3 Heparin Dosing Weight: 92 kg  Vital Signs: Temp: 97.6 F (36.4 C) (04/11 0744) Temp Source: Oral (04/11 0744) BP: 96/82 (04/11 0744) Pulse Rate: 81 (04/11 0744)  Labs: Recent Labs    12/23/18 1351  12/23/18 1944 12/24/18 0218 12/24/18 1148 12/25/18 0306 12/26/18 0707  HGB 11.9*  --   --  11.9*  --  12.0* 11.3*  HCT 37.1*  --   --  37.7*  --  37.9* 38.0*  PLT 56*  --   --  57*  --  42* 49*  HEPARINUNFRC  --    < > <0.10* 0.27* 0.30 0.38 0.35  CREATININE 7.33*  --   --  8.33*  --  5.38* 7.73*  TROPONINI 0.26*  --  0.25* 0.23*  --   --   --    < > = values in this interval not displayed.    Estimated Creatinine Clearance: 10.3 mL/min (A) (by C-G formula based on SCr of 7.73 mg/dL (H)).  Assessment: 86 yoM admitted for ACS workup, also in AFib. Initial heparin level undetectable, no issues with infusion per RN. Thrombocytopenia appears stable from last admit, HIT previously ruled out via SRA. 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 No bleeding noted  4/11 AM: Heparin level continues to be therapeutic at 0.35 on 1300 units/hr. No bleeding noted.Hgb stable 11.3, pltc 42 >>49.  Goal of Therapy:  Heparin level 0.3-0.7 units/ml Monitor platelets by anticoagulation protocol: Yes   Plan:  Continue heparin at 1300 units/hr Daily HL CBC F/U cards plans and need for further heparin  Thank you for involving pharmacy in this patient's care.  Janae Bridgeman, PharmD PGY1 Pharmacy Resident Phone: [phone] 12/26/2018 8:15 AM

## 2018-12-27 ENCOUNTER — Inpatient Hospital Stay (HOSPITAL_COMMUNITY): Payer: Medicare Other

## 2018-12-27 DIAGNOSIS — M79609 Pain in unspecified limb: Secondary | ICD-10-CM

## 2018-12-27 DIAGNOSIS — Z992 Dependence on renal dialysis: Secondary | ICD-10-CM

## 2018-12-27 DIAGNOSIS — M7989 Other specified soft tissue disorders: Secondary | ICD-10-CM

## 2018-12-27 DIAGNOSIS — N186 End stage renal disease: Secondary | ICD-10-CM

## 2018-12-27 DIAGNOSIS — I998 Other disorder of circulatory system: Secondary | ICD-10-CM

## 2018-12-27 DIAGNOSIS — J449 Chronic obstructive pulmonary disease, unspecified: Secondary | ICD-10-CM

## 2018-12-27 LAB — COMPREHENSIVE METABOLIC PANEL
ALT: 133 U/L — ABNORMAL HIGH (ref 0–44)
AST: 49 U/L — ABNORMAL HIGH (ref 15–41)
Albumin: 3.3 g/dL — ABNORMAL LOW (ref 3.5–5.0)
Alkaline Phosphatase: 95 U/L (ref 38–126)
Anion gap: 12 (ref 5–15)
BUN: 20 mg/dL (ref 8–23)
CO2: 24 mmol/L (ref 22–32)
Calcium: 8.5 mg/dL — ABNORMAL LOW (ref 8.9–10.3)
Chloride: 101 mmol/L (ref 98–111)
Creatinine, Ser: 5.82 mg/dL — ABNORMAL HIGH (ref 0.61–1.24)
GFR calc Af Amer: 11 mL/min — ABNORMAL LOW (ref >60)
GFR calc non Af Amer: 9 mL/min — ABNORMAL LOW (ref >60)
Glucose, Bld: 94 mg/dL (ref 70–99)
Potassium: 4.5 mmol/L (ref 3.5–5.1)
Sodium: 137 mmol/L (ref 135–145)
Total Bilirubin: 0.7 mg/dL (ref 0.3–1.2)
Total Protein: 5.5 g/dL — ABNORMAL LOW (ref 6.5–8.1)

## 2018-12-27 LAB — CBC WITH DIFFERENTIAL/PLATELET
Abs Immature Granulocytes: 0.02 10*3/uL (ref 0.00–0.07)
Basophils Absolute: 0 10*3/uL (ref 0.0–0.1)
Basophils Relative: 1 %
Eosinophils Absolute: 0.4 10*3/uL (ref 0.0–0.5)
Eosinophils Relative: 6 %
HCT: 37.9 % — ABNORMAL LOW (ref 39.0–52.0)
Hemoglobin: 11.3 g/dL — ABNORMAL LOW (ref 13.0–17.0)
Immature Granulocytes: 0 %
Lymphocytes Relative: 16 %
Lymphs Abs: 1 10*3/uL (ref 0.7–4.0)
MCH: 28.2 pg (ref 26.0–34.0)
MCHC: 29.8 g/dL — ABNORMAL LOW (ref 30.0–36.0)
MCV: 94.5 fL (ref 80.0–100.0)
Monocytes Absolute: 0.8 10*3/uL (ref 0.1–1.0)
Monocytes Relative: 13 %
Neutro Abs: 3.9 10*3/uL (ref 1.7–7.7)
Neutrophils Relative %: 64 %
Platelets: 33 10*3/uL — ABNORMAL LOW (ref 150–400)
RBC: 4.01 MIL/uL — ABNORMAL LOW (ref 4.22–5.81)
RDW: 21 % — ABNORMAL HIGH (ref 11.5–15.5)
WBC: 6 10*3/uL (ref 4.0–10.5)
nRBC: 0.5 % — ABNORMAL HIGH (ref 0.0–0.2)

## 2018-12-27 LAB — IRON AND TIBC
Iron: 31 ug/dL — ABNORMAL LOW (ref 45–182)
Saturation Ratios: 12 % — ABNORMAL LOW (ref 17.9–39.5)
TIBC: 269 ug/dL (ref 250–450)
UIBC: 238 ug/dL

## 2018-12-27 LAB — PHOSPHORUS: Phosphorus: 5.1 mg/dL — ABNORMAL HIGH (ref 2.5–4.6)

## 2018-12-27 LAB — RETICULOCYTES
Immature Retic Fract: 23.6 % — ABNORMAL HIGH (ref 2.3–15.9)
RBC.: 4.01 MIL/uL — ABNORMAL LOW (ref 4.22–5.81)
Retic Count, Absolute: 120.3 10*3/uL (ref 19.0–186.0)
Retic Ct Pct: 3 % (ref 0.4–3.1)

## 2018-12-27 LAB — VITAMIN B12: Vitamin B-12: 1263 pg/mL — ABNORMAL HIGH (ref 180–914)

## 2018-12-27 LAB — HEPARIN LEVEL (UNFRACTIONATED): Heparin Unfractionated: <0.1 IU/mL — ABNORMAL LOW (ref 0.30–0.70)

## 2018-12-27 LAB — FOLATE: Folate: 23 ng/mL (ref >5.9)

## 2018-12-27 LAB — MAGNESIUM: Magnesium: 2 mg/dL (ref 1.7–2.4)

## 2018-12-27 LAB — FERRITIN: Ferritin: 425 ng/mL — ABNORMAL HIGH (ref 24–336)

## 2018-12-27 MED ORDER — ASPIRIN 81 MG PO CHEW
81.0000 mg | CHEWABLE_TABLET | Freq: Once | ORAL | Status: AC
  Administered 2018-12-27: 81 mg via ORAL
  Filled 2018-12-27: qty 1

## 2018-12-27 MED ORDER — ALBUTEROL SULFATE (2.5 MG/3ML) 0.083% IN NEBU
2.5000 mg | INHALATION_SOLUTION | Freq: Four times a day (QID) | RESPIRATORY_TRACT | Status: DC | PRN
  Administered 2018-12-27: 2.5 mg via RESPIRATORY_TRACT

## 2018-12-27 MED ORDER — IOHEXOL 350 MG/ML SOLN
100.0000 mL | Freq: Once | INTRAVENOUS | Status: AC | PRN
  Administered 2018-12-27: 100 mL via INTRAVENOUS

## 2018-12-27 MED ORDER — ALBUTEROL SULFATE (2.5 MG/3ML) 0.083% IN NEBU
3.0000 mL | INHALATION_SOLUTION | RESPIRATORY_TRACT | Status: DC | PRN
  Administered 2018-12-28 – 2019-01-18 (×8): 3 mL via RESPIRATORY_TRACT
  Filled 2018-12-27: qty 30
  Filled 2018-12-27 (×11): qty 3

## 2018-12-27 MED ORDER — ALBUTEROL SULFATE (2.5 MG/3ML) 0.083% IN NEBU
INHALATION_SOLUTION | RESPIRATORY_TRACT | Status: AC
  Filled 2018-12-27: qty 3

## 2018-12-27 NOTE — Progress Notes (Signed)
[facility] ROUNDING NOTE   Subjective:   This is a 41 gentleman Tuesday Thursday Saturday dialysis at [facility].  He has history of coronary disease and COPD he was admitted with chest pain and is ruled out.  COVID-19 test is ruled out.  Abnormal LFTs that are slowly trending down.  Right upper quadrant ultrasound and hepatitis panel ordered.  Repeat LFTs   Ultrasound 12/26/2018 showed status post cholecystectomy with small left hepatic cyst  Blood pressure 96/82 pulse 87 temperature 97.6 O2 sats 96% room air  Sodium 137 potassium 4.5 chloride 101 CO2 24 BUN 20 creatinine 5.82 glucose 94 calcium 8.5 phosphorus 5.1 albumin 3.3 T sats 12% vitamin B12 1263 WBC 6 hemoglobin 11.3 platelets 33 AST 49 ALT 133  Medications amiodarone 200 mg daily, atorvastatin 40 mg daily, aspirin 81 mg daily, Toprol-XL 50 mg daily, Protonix 40 mg daily, heparin IV, Imdur 30 mg daily  Objective:  Vital signs in last 24 hours:  Temp:  [97.6 F (36.4 C)-98 F (36.7 C)] 97.6 F (36.4 C) (04/12 0413) Pulse Rate:  [41-81] 81 (04/12 0932) Resp:  [16-20] 16 (04/12 0413) BP: (94-148)/(60-127) 112/86 (04/12 0932) SpO2:  [93 %-98 %] 95 % (04/12 0822) Weight:  [87.7 kg-87.9 kg] 87.7 kg (04/12 0414)  Weight change:  Filed Weights   12/26/18 1412 12/26/18 1950 12/27/18 0414  Weight: 87.8 kg 87.9 kg 87.7 kg    Intake/Output: I/O last 3 completed shifts: In: 1388.1 [P.O.:480; I.V.:908.1] Out: 2150 [Urine:150; Other:2000]   Intake/Output this shift:  No intake/output data recorded.  CVS-regular rate and rhythm with no murmurs rubs or gallops RS- CTA no wheezes or rales left IJ cath left AV graft ABD- BS present soft non-distended EXT- no edema   Basic Metabolic Panel: Recent Labs  Lab 12/23/18 1351 12/24/18 0218 12/25/18 0306 12/26/18 0707 12/27/18 0442  NA 136 135 137 140 137  K 4.7 5.3* 3.7 4.8 4.5  CL 97* 98 99 99 101  CO2 18* 21* 24 26 24   GLUCOSE 81 101* 104*  110* 94  BUN 44* 52* 24* 37* 20  CREATININE 7.33* 8.33* 5.38* 7.73* 5.82*  CALCIUM 9.2 8.9 8.5* 8.8* 8.5*  MG  --   --  2.0 2.2 2.0  PHOS  --   --  4.9* 6.6* 5.1*    Liver Function Tests: Recent Labs  Lab 12/25/18 0306 12/26/18 0707 12/27/18 0442  AST 107* 60* 49*  ALT 228* 164* 133*  ALKPHOS 105 95 95  BILITOT 1.2 0.9 0.7  PROT 5.7* 5.4* 5.5*  ALBUMIN 3.3* 3.3* 3.3*   No results for input(s): LIPASE, AMYLASE in the last 168 hours. No results for input(s): AMMONIA in the last 168 hours.  CBC: Recent Labs  Lab 12/23/18 1351 12/24/18 0218 12/25/18 0306 12/26/18 0707 12/27/18 0442  WBC 9.7 10.3 9.0 7.2 6.0  NEUTROABS  --   --  7.2 5.3 3.9  HGB 11.9* 11.9* 12.0* 11.3* 11.3*  HCT 37.1* 37.7* 37.9* 38.0* 37.9*  MCV 90.9 91.3 92.0 94.5 94.5  PLT 56* 57* 42* 49* 33*    Cardiac Enzymes: Recent Labs  Lab 12/23/18 1351 12/23/18 1944 12/24/18 0218  TROPONINI 0.26* 0.25* 0.23*    BNP: Invalid input(s): POCBNP  CBG: No results for input(s): GLUCAP in the last 168 hours.  Microbiology: Results for orders placed or performed during the hospital encounter of 12/07/18  MRSA PCR Screening     Status: None   Collection Time: 12/07/18  9:07 PM  Result Value Ref Range Status   MRSA by PCR NEGATIVE NEGATIVE Final    Comment:        The GeneXpert MRSA Assay (FDA approved for NASAL specimens only), is one component of a comprehensive MRSA colonization surveillance program. It is not intended to diagnose MRSA infection nor to guide or monitor treatment for MRSA infections. Performed at [facility], [place] [address], [place], [place] [address]     Coagulation Studies: No results for input(s): LABPROT, INR in the last 72 hours.  Urinalysis: No results for input(s): COLORURINE, LABSPEC, PHURINE, GLUCOSEU, HGBUR, BILIRUBINUR, KETONESUR, PROTEINUR, UROBILINOGEN, NITRITE, LEUKOCYTESUR in the last 72 hours.  Invalid input(s): APPERANCEUR    Imaging: US Abdomen  Limited Ruq  Result Date: 12/26/2018 CLINICAL DATA:  Abnormal liver function tests. EXAM: ULTRASOUND ABDOMEN LIMITED RIGHT UPPER QUADRANT COMPARISON:  None. FINDINGS: Gallbladder: Status post cholecystectomy. Common bile duct: Diameter: 7 mm which is within normal limits for post cholecystectomy status. Liver: 8 mm left hepatic cyst is noted. Within normal limits in parenchymal echogenicity. Portal vein is patent on color Doppler imaging with normal direction of blood flow towards the liver. IMPRESSION: Status post cholecystectomy. Small left hepatic cyst. No other abnormality seen in the right upper quadrant of the abdomen. Electronically Signed   By: Marijo Conception, M.D.   On: 12/26/2018 22:27     Medications:   . sodium chloride    . sodium chloride    . sodium chloride     . albuterol      . amiodarone  200 mg Oral Daily  . aspirin  81 mg Oral Once  . atorvastatin  40 mg Oral QHS  . Chlorhexidine Gluconate Cloth  6 each Topical Q0600  . isosorbide mononitrate  30 mg Oral Daily  . metoprolol succinate  50 mg Oral Daily  . multivitamin  1 tablet Oral Daily  . pantoprazole  40 mg Oral QAC breakfast  . sodium chloride flush  3 mL Intravenous Q12H  . umeclidinium-vilanterol  1 puff Inhalation Daily   sodium chloride, sodium chloride, sodium chloride, acetaminophen **OR** acetaminophen, albuterol, alteplase, heparin, HYDROcodone-acetaminophen, metoprolol tartrate, ondansetron **OR** ondansetron (ZOFRAN) IV, sodium chloride flush  Assessment/ Plan:   ESRD-Monday and Tuesday Thursday Saturday dialysis underwent successful dialysis 12/26/2018 with removal of 2.1 L.  Next dialysis session will be on Tuesday, 12/29/2018  Chest pain appreciate assistance from [name].  Concerned about the cardiac catheterization in setting of thrombocytopenia.  Had recent Myoview with fixed defect.  History of CABG 2017.  No plans to proceed with cath and PCI at this time.  This is due to the thrombocytopenia  risk of bleeding.  Continues on Imdur 30 mg daily  Congestive heart failure with severely reduced systolic function 20 to 29% EF.  Thrombocytopenia appears to be chronic and has been present since January he had a heparin-induced antibody that was negative in 12/11/2018.  Appreciate assistance from 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  He can be followed up as an outpatient.  Hypotension.  This may limit ultrafiltration patient has had Imdur added was also on the beta-blocker metoprolol.  We may have to modify these medications unfortunately due to hypotension  Anemia stable does not appear to be an issue at this time no ESA's as an outpatient  Metabolic bone disease phosphorus appears to be in good control will continue to follow  Hyperlipidemia continues on atorvastatin  GERD continues on Protonix  Atrial fibrillation rate  controlled no anticoagulation secondary thrombocytopenia TSH 2.734    LOS: 3 Sherril Croon @TODAY @10 :35 AM

## 2018-12-27 NOTE — Consult Note (Addendum)
Referring Physician: Dr Lupita Leash  Patient name: Larry Klein MRN: [id number] DOB: Jun 30, 1956 Sex: male  REASON FOR CONSULT: right foot ischemia  HPI: Larry Klein is a 63 y.o. male, with a one month history of worsening pain in the right foot.  He has bilateral leg edema.  He does not really describe claudication.  He has ESRD and dialysis T Th Sat.  He was admitted with chest pain and most likely has symptomatic CAD but has been deemed not a candidate for intervention based on his thrombocytopenia preventing antiplatelet therapy.  He has a diagnosis of ITP. Other medical problems include hypertension elevated cholesterol COPD which are stable.  He is a former smoker quit in 2017.  Past Medical History:  Diagnosis Date  . COPD (chronic obstructive pulmonary disease) ([place])   . Coronary artery disease   . Dyspnea   . High cholesterol   . Hypertension   . Renal disorder    Past Surgical History:  Procedure Laterality Date  . CARDIAC SURGERY    . CHOLECYSTECTOMY    . THROMBECTOMY W/ EMBOLECTOMY Left 09/18/2018   Procedure: ARTERIOVENOUS FISTULA LEFT ARM;  Surgeon: Rosetta Posner, MD;  Location: [facility] OR;  Service: Vascular;  Laterality: Left;    Family History  Problem Relation Age of Onset  . Heart disease Mother   . Heart disease Father     SOCIAL HISTORY: Social History   Socioeconomic History  . Marital status: Single    Spouse name: Not on file  . Number of children: Not on file  . Years of education: Not on file  . Highest education level: Not on file  Occupational History  . Not on file  Social Needs  . Financial resource strain: Not on file  . Food insecurity:    Worry: Not on file    Inability: Not on file  . Transportation needs:    Medical: Not on file    Non-medical: Not on file  Tobacco Use  . Smoking status: Former Smoker    Last attempt to quit: 09/18/2015    Years since quitting: 3.2  . Smokeless tobacco: Never Used  Substance and Sexual Activity  .  Alcohol use: Not Currently  . Drug use: Never  . Sexual activity: Not Currently  Lifestyle  . Physical activity:    Days per week: Not on file    Minutes per session: Not on file  . Stress: Not on file  Relationships  . Social connections:    Talks on phone: Not on file    Gets together: Not on file    Attends religious service: Not on file    Active member of club or organization: Not on file    Attends meetings of clubs or organizations: Not on file    Relationship status: Not on file  . Intimate partner violence:    Fear of current or ex partner: Not on file    Emotionally abused: Not on file    Physically abused: Not on file    Forced sexual activity: Not on file  Other Topics Concern  . Not on file  Social History Narrative  . Not on file    No Active Allergies  Current Facility-Administered Medications  Medication Dose Route Frequency Provider Last Rate Last Dose  . 0.9 %  sodium chloride infusion  250 mL Intravenous PRN Dessa Phi, DO      . 0.9 %  sodium chloride infusion  100 mL Intravenous PRN Justin Mend,  Hassell Done, MD      . 0.9 %  sodium chloride infusion  100 mL Intravenous PRN Edrick Oh, MD      . acetaminophen (TYLENOL) tablet 650 mg  650 mg Oral Q6H PRN Dessa Phi, DO       Or  . acetaminophen (TYLENOL) suppository 650 mg  650 mg Rectal Q6H PRN Dessa Phi, DO      . albuterol (PROVENTIL) (2.5 MG/3ML) 0.083% nebulizer solution 3 mL  3 mL Inhalation Q4H PRN Kc, Ramesh, MD      . albuterol (PROVENTIL) (2.5 MG/3ML) 0.083% nebulizer solution           . alteplase (CATHFLO ACTIVASE) injection 2 mg  2 mg Intracatheter Once PRN Edrick Oh, MD      . amiodarone (PACERONE) tablet 200 mg  200 mg Oral Daily Dessa Phi, DO   200 mg at 12/27/18 0930  . atorvastatin (LIPITOR) tablet 40 mg  40 mg Oral QHS Dessa Phi, DO   40 mg at 12/26/18 2324  . Chlorhexidine Gluconate Cloth 2 % PADS 6 each  6 each Topical Q0600 Edrick Oh, MD   6 each at 12/27/18 0530   . heparin injection 1,000 Units  1,000 Units Dialysis PRN Edrick Oh, MD   1,000 Units at 12/24/18 2357  . HYDROcodone-acetaminophen (NORCO/VICODIN) 5-325 MG per tablet 1-2 tablet  1-2 tablet Oral Q4H PRN Dessa Phi, DO   2 tablet at 12/26/18 2325  . isosorbide mononitrate (IMDUR) 24 hr tablet 30 mg  30 mg Oral Daily Hilty, Nadean Corwin, MD   30 mg at 12/27/18 0930  . metoprolol succinate (TOPROL-XL) 24 hr tablet 50 mg  50 mg Oral Daily Dessa Phi, DO   50 mg at 12/27/18 0930  . metoprolol tartrate (LOPRESSOR) injection 5 mg  5 mg Intravenous Q5 min PRN Dessa Phi, DO      . multivitamin (RENA-VIT) tablet 1 tablet  1 tablet Oral Daily Raiford Noble [place], [place]   1 tablet at 12/27/18 7846  . ondansetron (ZOFRAN) tablet 4 mg  4 mg Oral Q6H PRN Dessa Phi, DO       Or  . ondansetron [facility]) injection 4 mg  4 mg Intravenous Q6H PRN Dessa Phi, DO   4 mg at 12/24/18 1755  . pantoprazole (PROTONIX) EC tablet 40 mg  40 mg Oral QAC breakfast Dessa Phi, DO   40 mg at 12/27/18 9629  . sodium chloride flush (NS) 0.9 % injection 3 mL  3 mL Intravenous Q12H Dessa Phi, DO   3 mL at 12/27/18 0928  . sodium chloride flush (NS) 0.9 % injection 3 mL  3 mL Intravenous PRN Dessa Phi, DO      . umeclidinium-vilanterol (ANORO ELLIPTA) 62.5-25 MCG/INH 1 puff  1 puff Inhalation Daily Raiford Noble [place], DO   1 puff at 12/27/18 0821    ROS:   General:  No weight loss, Fever, chills  HEENT: No recent headaches, no nasal bleeding, no visual changes, no sore throat  Neurologic: No dizziness, blackouts, seizures. No recent symptoms of stroke or mini- stroke. No recent episodes of slurred speech, or temporary blindness.  Cardiac: + recent episodes of chest pain/pressure, no shortness of breath at rest.  + shortness of breath with exertion.  Denies history of atrial fibrillation or irregular heartbeat  Vascular: + history of rest pain in feet.  No history of claudication.  No history of  non-healing ulcer, No history of DVT   Pulmonary: No home oxygen,  no productive cough, no hemoptysis,  No asthma or wheezing  Musculoskeletal:  [X]  Arthritis, [X]  Low back pain,  [X]  Joint pain  Hematologic:No history of hypercoagulable state.  No history of easy bleeding.  No history of anemia  Gastrointestinal: No hematochezia or melena,  No gastroesophageal reflux, no trouble swallowing  Urinary: [X]  chronic Kidney disease, [X]  on HD - [ ]  MWF or [X]  TTHS, [ ]  Burning with urination, [ ]  Frequent urination, [ ]  Difficulty urinating;   Skin: No rashes  Psychological: No history of anxiety,  No history of depression   Physical Examination  Vitals:   12/27/18 0414 12/27/18 0822 12/27/18 0932 12/27/18 1653  BP:   112/86 104/77  Pulse:   81 74  Resp:    20  Temp:    97.8 F (36.6 C)  TempSrc:    Oral  SpO2:  95%  98%  Weight: 87.7 kg     Height:        Body mass index is 26.97 kg/m.  General:  Alert and oriented, no acute distress HEENT: Normal Neck: No JVD Pulmonary: Clear to auscultation bilaterally Cardiac: Regular Rate and Rhythm  Abdomen: Soft, non-tender, non-distended, no mass, no scars Skin: No rash, ruborous foot bilaterally Extremity Pulses:  2+  femoral, absent popliteal dorsalis pedis, posterior tibial pulses bilaterally Musculoskeletal: left foot deformity partial amputation 1+ pretibial and pedal edema  Neurologic: Upper and lower extremity motor 5/5 and symmetric  DATA:  ABI right 0.7. left 0.9  CBC    Component Value Date/Time   WBC 6.0 12/27/2018 0442   RBC 4.01 (L) 12/27/2018 0442   RBC 4.01 (L) 12/27/2018 0442   HGB 11.3 (L) 12/27/2018 0442   HCT 37.9 (L) 12/27/2018 0442   PLT 33 (L) 12/27/2018 0442   MCV 94.5 12/27/2018 0442   MCH 28.2 12/27/2018 0442   MCHC 29.8 (L) 12/27/2018 0442   RDW 21.0 (H) 12/27/2018 0442   LYMPHSABS 1.0 12/27/2018 0442   MONOABS 0.8 12/27/2018 0442   EOSABS 0.4 12/27/2018 0442   BASOSABS 0.0 12/27/2018 0442     BMET    Component Value Date/Time   NA 137 12/27/2018 0442   K 4.5 12/27/2018 0442   CL 101 12/27/2018 0442   CO2 24 12/27/2018 0442   GLUCOSE 94 12/27/2018 0442   BUN 20 12/27/2018 0442   CREATININE 5.82 (H) 12/27/2018 0442   CALCIUM 8.5 (L) 12/27/2018 0442   GFRNONAA 9 (L) 12/27/2018 0442   GFRAA 11 (L) 12/27/2018 0442    ASSESSMENT:  Pt with ruborous foot most likely chronic ischemia which is surprising with ABI 0.7.  This is probably artificially inflated due to calcification  Thrombocytopenia secondary to ITP currently 30   PLAN: Will try to do arteriogram later this week.  Will have to find a time slot on a non dialysis day and most likely platelet transfusion to reduce risk of bleeding.  Will get CTA with runoff tomorrow to see if he may have any endovascular options since he would not be a bypass candidate with ongoing chest pain and symptomatic CAD   Ruta Hinds, MD [facility]: [phone] Pager: [phone]

## 2018-12-27 NOTE — Progress Notes (Addendum)
Right lower extremity venous duplex completed. Refer to "CV Proc" under chart review to view preliminary results.  Patient encounter: right calf appears to be cool and pale compared to left calf.  Preliminary results and patient encounter relayed to Dr. Lupita Leash.  12/27/2018 3:12 PM Maudry Mayhew, MHA, RVT, RDCS, RDMS

## 2018-12-27 NOTE — Progress Notes (Signed)
Pt advised to ambulate per MD Order. Pt states that he has been ambulating independently in the room today; reports baseline physical activity, regardless of right foot condition.

## 2018-12-27 NOTE — Progress Notes (Signed)
Vascular Tech spoke with Dr Lupita Leash. ABI study will be performed at bedside, tech en route to patient at this time.

## 2018-12-27 NOTE — Progress Notes (Signed)
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 fib with RVR, chronic thrombocytopenia, anemia, COPD, hypertension admitted on 12/23/2018 with c/o chest pain, dyspnea on exertion a few days initially presented to [facility] ER where CTA chest  negative for PE or any acute findings, discussed with [facility],  and transferred to [facility], was apparently also tested for low risk COVID-19. IN ER, p/w A fib, rate 100s, PVC, no ST change WBC 10.2, Hgb 12.9, platelet 83, INR 1.6, ABG 7.51 / 27 / 99 , Na 133, K 4.7, Cr 5.4, proBNP 66100, AST 355, ALT 295, CRP 41.4, Procalcitonin 0.52, Ferritin 938, Troponin 0.27. Patient was seen by cardiology, nephrology, managed for CAD/unstable angina, with heparin drip. Per cardiology felt he is not a candidate for surgical or percutaneous revascularization (previous CABG, thrombocytopenia). Cardiology added Imdur. He complains of swelling on the right lower extremity, urine dialyze at times difficulty for fluid removal due to hypotension. Off heparin gtt an COVID neg and transferred to  [facility] 4/12, cardio signed off.  Subjective: Concerned about the swelling in his right lower extremity mild redness.  Also complains of dyspnea on exertion.  " The swelling, and my heart needs to be fixed"  Assessment & Plan:  CAD with unstable angina, EKG without new ischemic changes, troponin elevation is flat and mild and recent nuclear stress test only showed a small area of apical ischemia.  Seen by cardiology off heparin drip, cardiology feels is not a candidate for surgical or percutaneous revascularization advised to continue on medical therapy: Lipitor, metoprolol, Imdur. Can increase Imdur to 60 mg if has  ongoing chest pain if post HD BP allows.  Not able to use aspirin due to his low platelet count.  Follow-up with cardiology as outpatient.  Acute on Chronic systolic CHF, w dyspnea on exertion, leg swelling: fluid is being managed in dialysis.  Continue Toprol 50, monitor blood pressure.Hypotension limiting ultrafiltration.  ESRD on HD tts, last HD 4/11, tolerated treatment for removal on 4/9.  Given hypotension is limiting his ultrafiltration.  Also on Imdur and metoprolol dose may need to be adjusted to prevent hypotension.  This morning blood pressure stable.  Ordered for nephrology plan for further fluid removal.   Lower ext swelling/ let s/p TMA old, and Right leg swelling.  With hyperpigmented skin changes- will obtain duplex of the lower extremities on the right.  Recent ABI on Rt normal and left w Moderate PVD 12/08/2018  Hypotension postdialysis.  On Imdur and metoprolol.  Monitor and adjust.  Persistent Atrial fibrillation with RVR : Rate controlled. C/w amiodarone, metoprolol. Not on anticoagulation due to thrombocytopenia.  COPD:Stable.  Continue his nebulizer and anoro Ellipta.  COVID 19 is negative. No pneumonia or fever.  Chronic thrombocytopenia suspecting chronic ITP.  Has been low since January patient reports it has been very low. I see as low as 30,000. Trend as below. HITT neg previous admission.  He has been referred to outpatient hematology in [place].Currently no obvious bleeding.  Recent Labs  Lab 12/23/18 1351 12/24/18 0218 12/25/18 0306 12/26/18 0707 12/27/18 0442  PLT 56* 57* 42* 49* 33*   Mild transaminitis suspect due to CHF.  Acute hepatitis panel pending. RUQ [place] s/p chole, no  acute findings,  Anemia of chronic renal disease.  Hemoglobin is stable.  no ESA as outpatient.  GERD on Protonix.  DVT prophylaxis: SCD Code Status: full Family Communication: patient Disposition Plan: remains inpatient pending clinical improvement.await nephro clearance. Pt  is reluctant to go home. We will ambulate him.  Consultants:  Nephrology Cardiology Case discussed with hematology.  Procedures:  Antimicrobials: Anti-infectives (From admission, onward)   None       Objective: Vitals:   12/27/18 0413 12/27/18 0414 12/27/18 0822 12/27/18 0932  BP: 101/83   112/86  Pulse: 65   81  Resp: 16     Temp: 97.6 F (36.4 C)     TempSrc: Oral     SpO2: 95%  95%   Weight:  87.7 kg    Height:        Intake/Output Summary (Last 24 hours) at 12/27/2018 0948 Last data filed at 12/27/2018 0821 Gross per 24 hour  Intake 618.23 ml  Output 2150 ml  Net -1531.77 ml   Filed Weights   12/26/18 1412 12/26/18 1950 12/27/18 0414  Weight: 87.8 kg 87.9 kg 87.7 kg   Weight change:   Body mass index is 26.97 kg/m.  Intake/Output from previous day: 04/11 0701 - 04/12 0700 In: 618.2 [P.O.:480; I.V.:138.2] Out: 2150 [Urine:150] Intake/Output this shift: No intake/output data recorded.  Examination:  General exam: Appears calm and comfortable,Not in distress, older fore the age HEENT:PERRL,Oral mucosa moist, Ear/Nose normal on gross exam Respiratory system: Bilateral equal air entry, normal vesicular breath sounds, no wheezes or crackles  Cardiovascular system: S1 & S2 heard,No JVD, murmurs. Gastrointestinal system: Abdomen is  soft, non tender, non distended, BS +  Nervous System:Alert and oriented. No focal neurological deficits/moving extremities, sensation intact. Extremities: RLE edema w erythema, chronic hyperpigmentation and skin changes.  Lt TMA chronic hyperpigmentation and skin changes  Skin: No rashes, lesions, no icterus MSK: Normal muscle bulk,tone ,power  Medications:  Scheduled Meds: . albuterol      . amiodarone  200 mg Oral Daily  . atorvastatin  40 mg Oral QHS  . Chlorhexidine Gluconate Cloth  6 each Topical Q0600  . isosorbide mononitrate  30 mg Oral Daily  . metoprolol succinate  50 mg Oral Daily  . multivitamin  1 tablet  Oral Daily  . pantoprazole  40 mg Oral QAC breakfast  . sodium chloride flush  3 mL Intravenous Q12H  . umeclidinium-vilanterol  1 puff Inhalation Daily   Continuous Infusions: . sodium chloride    . sodium chloride    . sodium chloride      Data Reviewed: I have personally reviewed following labs and imaging studies  CBC: Recent Labs  Lab 12/23/18 1351 12/24/18 0218 12/25/18 0306 12/26/18 0707 12/27/18 0442  WBC 9.7 10.3 9.0 7.2 6.0  NEUTROABS  --   --  7.2 5.3 3.9  HGB 11.9* 11.9* 12.0* 11.3* 11.3*  HCT 37.1* 37.7* 37.9* 38.0* 37.9*  MCV 90.9 91.3 92.0 94.5 94.5  PLT 56* 57* 42* 49* 33*   Basic Metabolic Panel: Recent Labs  Lab 12/23/18 1351 12/24/18 0218 12/25/18 0306 12/26/18 0707 12/27/18 0442  NA 136 135 137 140 137  K 4.7 5.3* 3.7 4.8 4.5  CL 97* 98 99 99 101  CO2 18* 21* 24 26 24   GLUCOSE 81 101* 104* 110* 94  BUN 44* 52* 24* 37* 20  CREATININE 7.33* 8.33* 5.38* 7.73* 5.82*  CALCIUM 9.2 8.9 8.5* 8.8* 8.5*  MG  --   --  2.0 2.2 2.0  PHOS  --   --  4.9* 6.6* 5.1*   GFR: Estimated Creatinine Clearance: 13.7 mL/min (A) (by C-G formula based on SCr of 5.82 mg/dL (H)). Liver Function Tests: Recent Labs  Lab 12/25/18 0306 12/26/18 0707 12/27/18 0442  AST 107* 60* 49*  ALT 228* 164* 133*  ALKPHOS 105 95 95  BILITOT 1.2 0.9 0.7  PROT 5.7* 5.4* 5.5*  ALBUMIN 3.3* 3.3* 3.3*   No results for input(s): LIPASE, AMYLASE in the last 168 hours. No results for input(s): AMMONIA in the last 168 hours. Coagulation Profile: No results for input(s): INR, PROTIME in the last 168 hours. Cardiac Enzymes: Recent Labs  Lab 12/23/18 1351 12/23/18 1944 12/24/18 0218  TROPONINI 0.26* 0.25* 0.23*   BNP (last 3 results) No results for input(s): PROBNP in the last 8760 hours. HbA1C: No results for input(s): HGBA1C in the last 72 hours. CBG: No results for input(s): GLUCAP in the last 168 hours. Lipid Profile: No results for input(s): CHOL, HDL, LDLCALC, TRIG,  CHOLHDL, LDLDIRECT in the last 72 hours. Thyroid Function Tests: No results for input(s): TSH, T4TOTAL, FREET4, T3FREE, THYROIDAB in the last 72 hours. Anemia Panel: Recent Labs    12/27/18 0442  VITAMINB12 1,263*  FOLATE 23.0  FERRITIN 425*  TIBC 269  IRON 31*  RETICCTPCT 3.0   Sepsis Labs: No results for input(s): PROCALCITON, LATICACIDVEN in the last 168 hours.  No results found for this or any previous visit (from the past 240 hour(s)).    Radiology Studies: US Abdomen Limited Ruq  Result Date: 12/26/2018 CLINICAL DATA:  Abnormal liver function tests. EXAM: ULTRASOUND ABDOMEN LIMITED RIGHT UPPER QUADRANT COMPARISON:  None. FINDINGS: Gallbladder: Status post cholecystectomy. Common bile duct: Diameter: 7 mm which is within normal limits for post cholecystectomy status. Liver: 8 mm left hepatic cyst is noted. Within normal limits in parenchymal echogenicity. Portal vein is patent on color Doppler imaging with normal direction of blood flow towards the liver. IMPRESSION: Status post cholecystectomy. Small left hepatic cyst. No other abnormality seen in the right upper quadrant of the abdomen. Electronically Signed   By: Marijo Conception, M.D.   On: 12/26/2018 22:27      LOS: 3 days   Time spent: More than 50% of that time was spent in counseling and/or coordination of care.  Antonieta Pert, MD [facility]  12/27/2018, 9:48 AM

## 2018-12-27 NOTE — Progress Notes (Signed)
ABI completed. Refer to "CV Proc" under chart review to view preliminary results.  Preliminary results discussed with Dr. Lupita Leash.  12/27/2018 4:44 PM Maudry Mayhew, MHA, RVT, RDCS, RDMS

## 2018-12-28 LAB — HEPATITIS PANEL, ACUTE
HCV Ab: 0.1 s/co ratio (ref 0.0–0.9)
HCV Ab: 0.1 s/co ratio (ref 0.0–0.9)
Hep A IgM: NEGATIVE
Hep A IgM: NEGATIVE
Hep B C IgM: NEGATIVE
Hep B C IgM: NEGATIVE
Hepatitis B Surface Ag: NEGATIVE
Hepatitis B Surface Ag: NEGATIVE

## 2018-12-28 LAB — CBC
HCT: 36.2 % — ABNORMAL LOW (ref 39.0–52.0)
Hemoglobin: 11.3 g/dL — ABNORMAL LOW (ref 13.0–17.0)
MCH: 29.3 pg (ref 26.0–34.0)
MCHC: 31.2 g/dL (ref 30.0–36.0)
MCV: 93.8 fL (ref 80.0–100.0)
Platelets: 32 10*3/uL — ABNORMAL LOW (ref 150–400)
RBC: 3.86 MIL/uL — ABNORMAL LOW (ref 4.22–5.81)
RDW: 21 % — ABNORMAL HIGH (ref 11.5–15.5)
WBC: 5.8 10*3/uL (ref 4.0–10.5)
nRBC: 0 % (ref 0.0–0.2)

## 2018-12-28 LAB — MAGNESIUM: Magnesium: 2.1 mg/dL (ref 1.7–2.4)

## 2018-12-28 LAB — BASIC METABOLIC PANEL
Anion gap: 15 (ref 5–15)
BUN: 32 mg/dL — ABNORMAL HIGH (ref 8–23)
CO2: 22 mmol/L (ref 22–32)
Calcium: 8.5 mg/dL — ABNORMAL LOW (ref 8.9–10.3)
Chloride: 101 mmol/L (ref 98–111)
Creatinine, Ser: 7.72 mg/dL — ABNORMAL HIGH (ref 0.61–1.24)
GFR calc Af Amer: 8 mL/min — ABNORMAL LOW (ref >60)
GFR calc non Af Amer: 7 mL/min — ABNORMAL LOW (ref >60)
Glucose, Bld: 127 mg/dL — ABNORMAL HIGH (ref 70–99)
Potassium: 4.1 mmol/L (ref 3.5–5.1)
Sodium: 138 mmol/L (ref 135–145)

## 2018-12-28 MED ORDER — SODIUM CHLORIDE 0.9% IV SOLUTION: Freq: Once | INTRAVENOUS | Status: DC

## 2018-12-28 MED ORDER — CHLORHEXIDINE GLUCONATE CLOTH 2 % EX PADS: 6.0000 | MEDICATED_PAD | Freq: Every day | CUTANEOUS | Status: DC

## 2018-12-28 NOTE — Progress Notes (Signed)
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 fib with RVR, chronic thrombocytopenia, anemia, COPD, hypertension admitted on 12/23/2018 with c/o chest pain, dyspnea on exertion a few days initially presented to [facility] where CTA chest  negative for PE or any acute findings, discussed with [facility],  and transferred to [facility], was apparently also tested for low risk COVID-19. IN ER, p/w A fib, rate 100s, PVC, no ST change WBC 10.2, Hgb 12.9, platelet 83, INR 1.6, ABG 7.51 / 27 / 99 , Na 133, K 4.7, Cr 5.4, proBNP 66100, AST 355, ALT 295, CRP 41.4, Procalcitonin 0.52, Ferritin 938, Troponin 0.27. Patient was seen by cardiology, nephrology, managed for CAD/unstable angina, with heparin drip. Per cardiology felt he is not a candidate for surgical or percutaneous revascularization (previous CABG, thrombocytopenia). Cardiology added Imdur. He complains of swelling on the right lower extremity, urine dialyze at times difficulty for fluid removal due to hypotension. Off heparin gtt an COVID neg and transferred to  [facility] 4/12, cardio signed off.  Patient had hyperpigmented skin changes in the right lower leg negative for DVT but suspecting PVD and vascular surgery was consulted 4/12.  Subjective: Reports that he feels okay, "it is getting there" , has  some shortness of breath but overall feels better No nausea vomiting fever chills.  Assessment & Plan:  CAD with unstable angina, EKG without new ischemic changes, troponin elevation is flat and mild and recent nuclear stress test only showed a small area of apical ischemia.  Seen by cardiology off heparin drip, cardiology feels is not a candidate for surgical or percutaneous  revascularization advised to continue on medical therapy and signed of, Cont on Lipitor, metoprolol, Imdur and can increase Imdur to 60 mg if has ongoing chest pain  And if post HD BP allows.Not able to use aspirin due to his low platelet count.  Follow-up with cardiology as outpatient.  Acute on Chronic systolic CHF, w dyspnea on exertion, leg swelling: fluid is being managed in dialysis.  Continue Toprol 50, monitor blood pressure.   ESRD on HD TTSS, last HD 4/11- hypotension limiting his ultrafiltration.  Also on Imdur and metoprolol dose may need to be adjusted to prevent hypotension.  Blood pressure currently stable, due for dialysis tomorrow.    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 No DVT in duplex. Vascular surgery consulted and CTA 4/13 to see if any in the vascular option since not a candidate for bypass. Noted plan for arteriopgram on no HD day and may need plt transfusion-will type and cross.  Hypotension postdialysis.  On Imdur and metoprolol. BP is stable currently.  Persistent Atrial fibrillation:Rate controlled on amiodarone, metoprolol. Not on anticoagulation due to thrombocytopenia. Monitr w/ NSVT- check k and mag and replete , cont current meds.  COPD:Stable.  Continue his nebulizer and anoro Ellipta.  COVID 19 is negative. No pneumonia or fever.  Chronic thrombocytopenia suspecting chronic ITP.  Has been low since January patient reports it has been very low. I see as low as 30,000. Trend as below. HITT neg previous admission.  He has been referred to  outpatient hematology in [place].Currently no obvious bleeding and platelets are low but stable.  Recent Labs  Lab 12/24/18 0218 12/25/18 0306 12/26/18 0707 12/27/18 0442 12/28/18 0456  PLT 57* 42* 49* 33* 32*   Mild transaminitis suspect due to CHF.  Acute hepatitis panel pending. RUQ [place] s/p chole, no acute  findings,  Anemia of chronic renal disease.  Hemoglobin is stable.  no ESA as outpatient.  GERD on Protonix.  DVT prophylaxis: SCD Code Status: full Family Communication: patient Disposition Plan: Remains inpatient pending clinical improvement and clearance from vascular and nephrology.    Consultants:  Nephrology Cardiology Case discussed with hematology.  Procedures:  Antimicrobials: Anti-infectives (From admission, onward)   None       Objective: Vitals:   12/28/18 0406 12/28/18 0408 12/28/18 0800 12/28/18 0830  BP: 109/67  116/81   Pulse: 65  82   Resp: 20  18   Temp: 97.8 F (36.6 C)  97.7 F (36.5 C)   TempSrc: Oral  Oral   SpO2: 94%  96% 96%  Weight:  88.5 kg    Height:        Intake/Output Summary (Last 24 hours) at 12/28/2018 1117 Last data filed at 12/28/2018 0841 Gross per 24 hour  Intake 700 ml  Output 2 ml  Net 698 ml   Filed Weights   12/26/18 1950 12/27/18 0414 12/28/18 0408  Weight: 87.9 kg 87.7 kg 88.5 kg   Weight change: -1.548 kg  Body mass index is 27.2 kg/m.  Intake/Output from previous day: 04/12 0701 - 04/13 0700 In: 84 [P.O.:820] Out: 302 [Urine:302] Intake/Output this shift: Total I/O In: 120 [P.O.:120] Out: -   Examination:  General exam: Alert awake oriented, not in acute distress.   HEENT:PERRL,Oral mucosa moist, Ear/Nose normal on gross exam Respiratory system: CTA b/l no crackles  Cardiovascular system: S1 & S2 heard,No JVD, murmurs. Gastrointestinal system: Abdomen is  soft, non tender, non distended, BS +  Nervous System:Alert and oriented. No focal neurological deficits/moving extremities, sensation intact. Extremities: RLE w erythema/edema hyperpigmented skin changes. Lt TMA chronic hyperpigmentation and skin changes  Skin: No rashes, lesions, no icterus MSK: Normal muscle bulk,tone ,power  Medications:  Scheduled Meds:  amiodarone  200 mg Oral Daily   atorvastatin  40 mg Oral QHS   Chlorhexidine  Gluconate Cloth  6 each Topical Q0600   isosorbide mononitrate  30 mg Oral Daily   metoprolol succinate  50 mg Oral Daily   multivitamin  1 tablet Oral Daily   pantoprazole  40 mg Oral QAC breakfast   sodium chloride flush  3 mL Intravenous Q12H   umeclidinium-vilanterol  1 puff Inhalation Daily   Continuous Infusions:  sodium chloride     sodium chloride     sodium chloride      Data Reviewed: I have personally reviewed following labs and imaging studies  CBC: Recent Labs  Lab 12/24/18 0218 12/25/18 0306 12/26/18 0707 12/27/18 0442 12/28/18 0456  WBC 10.3 9.0 7.2 6.0 5.8  NEUTROABS  --  7.2 5.3 3.9  --   HGB 11.9* 12.0* 11.3* 11.3* 11.3*  HCT 37.7* 37.9* 38.0* 37.9* 36.2*  MCV 91.3 92.0 94.5 94.5 93.8  PLT 57* 42* 49* 33* 32*   Basic Metabolic Panel: Recent Labs  Lab 12/23/18 1351 12/24/18 0218 12/25/18 0306 12/26/18 0707 12/27/18 0442  NA 136 135 137 140 137  K 4.7 5.3* 3.7 4.8 4.5  CL 97* 98 99 99 101  CO2 18* 21* 24 26  24  GLUCOSE 81 101* 104* 110* 94  BUN 44* 52* 24* 37* 20  CREATININE 7.33* 8.33* 5.38* 7.73* 5.82*  CALCIUM 9.2 8.9 8.5* 8.8* 8.5*  MG  --   --  2.0 2.2 2.0  PHOS  --   --  4.9* 6.6* 5.1*   GFR: Estimated Creatinine Clearance: 13.7 mL/min (A) (by C-G formula based on SCr of 5.82 mg/dL (H)). Liver Function Tests: Recent Labs  Lab 12/25/18 0306 12/26/18 0707 12/27/18 0442  AST 107* 60* 49*  ALT 228* 164* 133*  ALKPHOS 105 95 95  BILITOT 1.2 0.9 0.7  PROT 5.7* 5.4* 5.5*  ALBUMIN 3.3* 3.3* 3.3*   No results for input(s): LIPASE, AMYLASE in the last 168 hours. No results for input(s): AMMONIA in the last 168 hours. Coagulation Profile: No results for input(s): INR, PROTIME in the last 168 hours. Cardiac Enzymes: Recent Labs  Lab 12/23/18 1351 12/23/18 1944 12/24/18 0218  TROPONINI 0.26* 0.25* 0.23*   BNP (last 3 results) No results for input(s): PROBNP in the last 8760 hours. HbA1C: No results for input(s): HGBA1C  in the last 72 hours. CBG: No results for input(s): GLUCAP in the last 168 hours. Lipid Profile: No results for input(s): CHOL, HDL, LDLCALC, TRIG, CHOLHDL, LDLDIRECT in the last 72 hours. Thyroid Function Tests: No results for input(s): TSH, T4TOTAL, FREET4, T3FREE, THYROIDAB in the last 72 hours. Anemia Panel: Recent Labs    12/27/18 0442  VITAMINB12 1,263*  FOLATE 23.0  FERRITIN 425*  TIBC 269  IRON 31*  RETICCTPCT 3.0   Sepsis Labs: No results for input(s): PROCALCITON, LATICACIDVEN in the last 168 hours.  No results found for this or any previous visit (from the past 240 hour(s)).    Radiology Studies: Ct Angio Ao+bifem W & Or Wo Contrast  Result Date: 12/28/2018 CLINICAL DATA:  Right foot ischemia EXAM: CT ANGIOGRAPHY OF ABDOMINAL AORTA WITH ILIOFEMORAL RUNOFF TECHNIQUE: Multidetector CT imaging of the abdomen, pelvis and lower extremities was performed using the standard protocol during bolus administration of intravenous contrast. Multiplanar CT image reconstructions and MIPs were obtained to evaluate the vascular anatomy. CONTRAST:  138mL OMNIPAQUE IOHEXOL 350 MG/ML SOLN COMPARISON:  None. FINDINGS: VASCULAR Aorta: Descending thoracic aorta is 4.2 cm in caliber. An aorto bi-iliac stent graft is in place excluding an aortic aneurysm. Maximal AP and transverse diameters are 6.2 and 5.5 cm. Aortic and iliac limbs are patent. There is no obvious endoleak. Pre contrast and venous phase images are not included in this study. Celiac: Patent. SMA: Mild smooth soft plaque along the wall of the proximal SMA with some atherosclerotic calcification at the origin. No significant narrowing. Renals: A dominant right upper pole renal artery is patent. A lower pole accessory right renal artery does not opacify with contrast likely due to exclusion by the stent graft. A single left renal artery is patent. IMA: Origin has been sacrificed. RIGHT Lower Extremity Inflow: Right common iliac landing zone  is patent, distal and nonaneurysmal. There are atherosclerotic changes in the right internal and external iliac arteries without significant narrowing. Outflow: Right common femoral artery is patent with minimal atherosclerotic changes. Profunda femoral artery is patent. The right superficial femoral artery is mildly diffusely diseased but without significant focal narrowing. Runoff: The right popliteal artery is partially obscured by motion artifact. There is 50% narrowing above the knee joint due to smooth soft plaque on image 274 of series 6. There is grossly 2 vessel runoff via the right posterior tibial and peroneal  arteries. LEFT Lower Extremity Inflow: Left common iliac landing zone is distal common nonaneurysmal, and patent. Left external iliac artery is patent with mild diffuse atherosclerotic changes. There is significant narrowing at the origin of the left internal iliac artery. Outflow: Left common femoral artery is patent and mildly disease. The profunda femoral artery is patent. The origin of the left superficial femoral artery is diminutive. This vessel is diffusely diminutive and moderately diseased without significant focal narrowing. Runoff: The popliteal artery is also obscured partially due to motion artifact. It is grossly patent. There is 1 vessel runoff via the left posterior tibial artery. Review of the MIP images confirms the above findings. NON-VASCULAR Lower chest: Small right pleural effusion. Subsegmental atelectasis at the dependent lung bases. Cardiomegaly. Hepatobiliary: Post cholecystectomy. There is a very small rim calcified area measuring less than 1 cm in the gallbladder fossa of unknown significance which may be related to postoperative changes or fat necrosis in surgical bed. Liver is otherwise within normal limits. Pancreas: Unremarkable Spleen: Unremarkable Adrenals/Urinary Tract: There is severe atrophy of both kidneys. There is a large calculus in the left kidney measuring  1.5 cm. There are hyperdense lesions in the lower pole of the left kidney which are multiple. The largest measures 2.6 cm on image 73. It is associated with a rim calcification. This is a nonspecific finding and may represent a hyperdense complex cyst or an enhancing mass. The bladder contains hyperdense material representing either hemorrhage or contrast. Stomach/Bowel: No obvious mass in the colon. Small bowel is decompressed. Stomach contains enteric contents. Lymphatic: There is no abnormal retroperitoneal adenopathy. Reproductive: Normal prostate. Other: There is moderate free fluid within the pelvis. There is a very small amount of fluid anterior to the liver. Musculoskeletal: No vertebral compression deformity. Bilateral L5 pars defects with grade 1 spondylolisthesis measuring 7 mm. It is associated with severe degenerative disc disease at L5-S1. There is a large mass in the calf just deep to the Achilles tendon measuring 3.9 x 6.5 x 12.9 cm. It is somewhat heterogeneous and relatively low-density. Hounsfield unit measurements are well above simple fluid at 43. It is associated with calcifications. IMPRESSION: VASCULAR Motion artifact limits this study. The popliteal arteries were partially obscured but are grossly patent. Aorto bi-iliac stent graft is patent excluding an aortic aneurysm. There is no obvious endoleak. Maximal aneurysm sac diameter is 6.2 cm. There are no prior studies available to assess for interval growth. No significant narrowing in the right or left external iliac arteries. There is significant narrowing at the origin of the left internal iliac artery. Right common femoral and femoral arteries are patent with atherosclerotic disease. There is 50% narrowing in the right popliteal artery above the knee. Two vessel runoff to the right ankle. Left common femoral artery is patent. The left superficial femoral artery is diffusely moderately disease without significant focal narrowing. There is  1 vessel runoff to the left ankle via the posterior tibial artery. NON-VASCULAR Small right pleural effusion.  Bibasilar atelectasis. Postcholecystectomy. Calcified area in the gallbladder bed likely is associated with postoperative change. Left nephrolithiasis. There are hypodense lesions in the lower pole of the left kidney which are nonspecific. Differential diagnosis includes hyperdense cyst or enhancing mass. MRI may be helpful to further characterize. If MRI is not feasible, ultrasound may be helpful to characterize these areas as cystic. Hyperdense material in the bladder is nonspecific. Differential diagnosis includes excreted contrast or hemorrhage. Moderate free fluid in the pelvis. Very minimal fluid in the abdomen  about the liver. Large mass within the left calf as described. Differential diagnosis includes nonspecific fluid, hemorrhage, or enhancing neoplasm. Ultrasound or MRI may be helpful. Electronically Signed   By: Marybelle Killings M.D.   On: 12/28/2018 07:47   Vas [place] Burnard Bunting With/wo Tbi  Result Date: 12/27/2018 LOWER EXTREMITY DOPPLER STUDY Indications: Pain, discoloration.  Performing Technologist: Maudry Mayhew MHA, RVT, RDCS, RDMS  Examination Guidelines: A complete evaluation includes at minimum, Doppler waveform signals and systolic blood pressure reading at the level of bilateral brachial, anterior tibial, and posterior tibial arteries, when vessel segments are accessible. Bilateral testing is considered an integral part of a complete examination. Photoelectric Plethysmograph (PPG) waveforms and toe systolic pressure readings are included as required and additional duplex testing as needed. Limited examinations for reoccurring indications may be performed as noted.  ABI Findings: +---------+------------------+-----+----------+--------+  Right     Rt Pressure (mmHg) Index Waveform   Comment   +---------+------------------+-----+----------+--------+  Brachial  136                       triphasic            +---------+------------------+-----+----------+--------+  PTA       94                 0.69  monophasic           +---------+------------------+-----+----------+--------+  DP        98                 0.72  monophasic           +---------+------------------+-----+----------+--------+  Great Toe 36                 0.26                       +---------+------------------+-----+----------+--------+ +--------+------------------+-----+----------+-------+  Left     Lt Pressure (mmHg) Index Waveform   Comment  +--------+------------------+-----+----------+-------+  Brachial 125                      triphasic           +--------+------------------+-----+----------+-------+  PTA      91                 0.67  monophasic          +--------+------------------+-----+----------+-------+  DP       116                0.85  monophasic          +--------+------------------+-----+----------+-------+ +-------+-----------+---------------------+------------+---------------------+  ABI/TBI Today's ABI Today's TBI           Previous ABI Previous TBI           +-------+-----------+---------------------+------------+---------------------+  Right   0.72        0.26                  0.98         Not obtained           +-------+-----------+---------------------+------------+---------------------+  Left    0.85        Unable to be obtained 0.53         Unable to be obtained  +-------+-----------+---------------------+------------+---------------------+ Right ABIs appear decreased compared to prior study on 12/08/2018.  Summary: Right: Resting right ankle-brachial index indicates moderate right lower extremity arterial disease. The right toe-brachial index is abnormal. Left: Resting left  ankle-brachial index indicates mild left lower extremity arterial disease. Unable to obtain left great toe pressure due to transmetatarsal amputation x50 years post truck falling on his foot. Given anatomic distortion to the left foot and  ankle, Doppler interrogation and results can be difficult to duplicate.  *See table(s) above for measurements and observations.  Electronically signed by Ruta Hinds MD on 12/27/2018 at 5:03:01 PM.   Final    Vas [place] Lower Extremity Venous (dvt)  Result Date: 12/27/2018  Lower Venous Study Indications: Pain, Swelling, and Erythema.  Performing Technologist: Maudry Mayhew MHA, RDMS, RVT, RDCS  Examination Guidelines: A complete evaluation includes B-mode imaging, spectral Doppler, color Doppler, and power Doppler as needed of all accessible portions of each vessel. Bilateral testing is considered an integral part of a complete examination. Limited examinations for reoccurring indications may be performed as noted.  Right Venous Findings: +---------+---------------+---------+-----------+----------+--------------+            Compressibility Phasicity Spontaneity Properties Summary         +---------+---------------+---------+-----------+----------+--------------+  CFV       Full            No        Yes                    pulsatile flow  +---------+---------------+---------+-----------+----------+--------------+  SFJ       Full                                                             +---------+---------------+---------+-----------+----------+--------------+  FV Prox   Full                                                             +---------+---------------+---------+-----------+----------+--------------+  FV Mid    Full                                                             +---------+---------------+---------+-----------+----------+--------------+  FV Distal Full                                                             +---------+---------------+---------+-----------+----------+--------------+  PFV       Full                                                             +---------+---------------+---------+-----------+----------+--------------+  POP       Full            No        Yes  pulsatile flow  +---------+---------------+---------+-----------+----------+--------------+  PTV       Full                                                             +---------+---------------+---------+-----------+----------+--------------+  PERO      Full                                                             +---------+---------------+---------+-----------+----------+--------------+  Left Venous Findings: +---+---------------+---------+-----------+----------+--------------+      Compressibility Phasicity Spontaneity Properties Summary         +---+---------------+---------+-----------+----------+--------------+  CFV Full            No        Yes                    pulsatile flow  +---+---------------+---------+-----------+----------+--------------+    Summary: Right: There is no evidence of deep vein thrombosis in the lower extremity. No cystic structure found in the popliteal fossa. Left: No evidence of common femoral vein obstruction.  *See table(s) above for measurements and observations. Electronically signed by Ruta Hinds MD on 12/27/2018 at 5:03:22 PM.    Final    US Abdomen Limited Ruq  Result Date: 12/26/2018 CLINICAL DATA:  Abnormal liver function tests. EXAM: ULTRASOUND ABDOMEN LIMITED RIGHT UPPER QUADRANT COMPARISON:  None. FINDINGS: Gallbladder: Status post cholecystectomy. Common bile duct: Diameter: 7 mm which is within normal limits for post cholecystectomy status. Liver: 8 mm left hepatic cyst is noted. Within normal limits in parenchymal echogenicity. Portal vein is patent on color Doppler imaging with normal direction of blood flow towards the liver. IMPRESSION: Status post cholecystectomy. Small left hepatic cyst. No other abnormality seen in the right upper quadrant of the abdomen. Electronically Signed   By: Marijo Conception, M.D.   On: 12/26/2018 22:27      LOS: 4 days   Time spent: More than 50% of that time was spent in counseling and/or coordination of  care.  Antonieta Pert, MD [facility]  12/28/2018, 11:17 AM

## 2018-12-28 NOTE — Consult Note (Signed)
Pt scheduled for lower extremity arteriogram on Thursday by my partner Dr Carlis Abbott We will arrange to transfuse platelets periprocedure.  He will be high risk but foot is currently threatened.  Ruta Hinds, MD [facility] Office: [phone] Pager: [phone]

## 2018-12-28 NOTE — Progress Notes (Signed)
[facility] ROUNDING NOTE   Subjective:   This is a 72 gentleman Tuesday Thursday Saturday dialysis at [facility].  He has history of coronary disease and COPD he was admitted with chest pain and is ruled out.  COVID-19 test is ruled out.  Abnormal LFTs that are slowly trending down.  Right upper quadrant ultrasound and hepatitis panel ordered.  Repeat LFTs   Ultrasound 12/26/2018 showed status post cholecystectomy with small left hepatic cyst.   Objective:  Vital signs in last 24 hours:  Temp:  [97.7 F (36.5 C)-98.3 F (36.8 C)] 98.1 F (36.7 C) (04/13 1226) Pulse Rate:  [65-82] 73 (04/13 1226) Resp:  [16-20] 18 (04/13 1226) BP: (98-116)/(67-81) 104/78 (04/13 1226) SpO2:  [94 %-99 %] 98 % (04/13 1226) Weight:  [88.5 kg] 88.5 kg (04/13 0408)  Weight change: -1.548 kg Filed Weights   12/26/18 1950 12/27/18 0414 12/28/18 0408  Weight: 87.9 kg 87.7 kg 88.5 kg    Intake/Output: I/O last 3 completed shifts: In: 8250 [P.O.:1300; I.V.:3] Out: 452 [Urine:452]   Intake/Output this shift:  Total I/O In: 240 [P.O.:240] Out: -   Exam Alert , Ox 3, eating breakfast, sitting on side of bed No jvd CVS-regular rate and rhythm with no murmurs rubs or gallops RS- mild scattered exp wheezing bilat, no rales or ^wob, L IJ cath  ABD- BS present soft non-distended EXT- no LE edema, R foot > lower leg erythematous, tender. L foot TMA is wrapped.  L RC AVF+bruit NF, Ox 3   TTS HD  4h  400/800  92.5kg  3K/2.25 bath   LIJ TDC/ L RC AVF (created 09/18/18) Hep none - calcitriol 0.5 tiw  Assessment/ Plan:   R foot chronic ischemia? - VVS in process of working this up.   ESRD- TTS HD.  Next HD tomorrow, we can work HD around any vascular procedures that need to be done. Under dry wt. No vol^ on exam.   Chest pain appreciate assistance from [name].  Concerned about the cardiac catheterization in setting of thrombocytopenia.  Had recent Myoview with fixed  defect.  History of CABG 2017.  No plans to proceed with cath and PCI at this time.  This is due to the thrombocytopenia risk of bleeding.  Continues on Imdur 30 mg daily  Congestive heart failure with severely reduced systolic function 20 to 53% EF.  Thrombocytopenia- appears to be chronic and has been present since January he had a heparin-induced antibody that was negative in 12/11/2018.  Appreciate assistance from 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  He can be followed up as an outpatient.  Hypotension - this may limit ultrafiltration patient has had Imdur added was also on the beta-blocker metoprolol.  We may have to modify these medications unfortunately due to hypotension. Looks a bit dry on exam today. Let volume / BP come up some, he is under dry wt 4kg  Anemia stable does not appear to be an issue at this time no ESA's as an outpatient  Metabolic bone disease phosphorus appears to be in good control will continue to follow  Hyperlipidemia continues on atorvastatin  GERD continues on Protonix  Atrial fibrillation rate controlled no anticoagulation secondary thrombocytopenia TSH 2.734    LOS: 4 Sandy Salaam Kanai Hilger @TODAY @1 :41 PM   Basic Metabolic Panel: Recent Labs  Lab 12/24/18 0218 12/25/18 0306 12/26/18 0707 12/27/18 0442 12/28/18 1022  NA 135 137 140 137 138  K 5.3* 3.7 4.8 4.5  4.1  CL 98 99 99 101 101  CO2 21* 24 26 24 22   GLUCOSE 101* 104* 110* 94 127*  BUN 52* 24* 37* 20 32*  CREATININE 8.33* 5.38* 7.73* 5.82* 7.72*  CALCIUM 8.9 8.5* 8.8* 8.5* 8.5*  MG  --  2.0 2.2 2.0 2.1  PHOS  --  4.9* 6.6* 5.1*  --     Liver Function Tests: Recent Labs  Lab 12/25/18 0306 12/26/18 0707 12/27/18 0442  AST 107* 60* 49*  ALT 228* 164* 133*  ALKPHOS 105 95 95  BILITOT 1.2 0.9 0.7  PROT 5.7* 5.4* 5.5*  ALBUMIN 3.3* 3.3* 3.3*   No results for input(s): LIPASE, AMYLASE in the last 168 hours. No results for input(s): AMMONIA in the last 168  hours.  CBC: Recent Labs  Lab 12/24/18 0218 12/25/18 0306 12/26/18 0707 12/27/18 0442 12/28/18 0456  WBC 10.3 9.0 7.2 6.0 5.8  NEUTROABS  --  7.2 5.3 3.9  --   HGB 11.9* 12.0* 11.3* 11.3* 11.3*  HCT 37.7* 37.9* 38.0* 37.9* 36.2*  MCV 91.3 92.0 94.5 94.5 93.8  PLT 57* 42* 49* 33* 32*    Cardiac Enzymes: Recent Labs  Lab 12/23/18 1351 12/23/18 1944 12/24/18 0218  TROPONINI 0.26* 0.25* 0.23*    BNP: Invalid input(s): POCBNP  CBG: No results for input(s): GLUCAP in the last 168 hours.  Microbiology: Results for orders placed or performed during the hospital encounter of 12/07/18  MRSA PCR Screening     Status: None   Collection Time: 12/07/18  9:07 PM  Result Value Ref Range Status   MRSA by PCR NEGATIVE NEGATIVE Final    Comment:        The GeneXpert MRSA Assay (FDA approved for NASAL specimens only), is one component of a comprehensive MRSA colonization surveillance program. It is not intended to diagnose MRSA infection nor to guide or monitor treatment for MRSA infections. Performed at [facility], [place] [address], [place], [place] [address]     Coagulation Studies: No results for input(s): LABPROT, INR in the last 72 hours.  Urinalysis: No results for input(s): COLORURINE, LABSPEC, PHURINE, GLUCOSEU, HGBUR, BILIRUBINUR, KETONESUR, PROTEINUR, UROBILINOGEN, NITRITE, LEUKOCYTESUR in the last 72 hours.  Invalid input(s): APPERANCEUR    Imaging: Ct Angio Ao+bifem W & Or Wo Contrast  Result Date: 12/28/2018 CLINICAL DATA:  Right foot ischemia EXAM: CT ANGIOGRAPHY OF ABDOMINAL AORTA WITH ILIOFEMORAL RUNOFF TECHNIQUE: Multidetector CT imaging of the abdomen, pelvis and lower extremities was performed using the standard protocol during bolus administration of intravenous contrast. Multiplanar CT image reconstructions and MIPs were obtained to evaluate the vascular anatomy. CONTRAST:  167mL OMNIPAQUE IOHEXOL 350 MG/ML SOLN COMPARISON:  None. FINDINGS:  VASCULAR Aorta: Descending thoracic aorta is 4.2 cm in caliber. An aorto bi-iliac stent graft is in place excluding an aortic aneurysm. Maximal AP and transverse diameters are 6.2 and 5.5 cm. Aortic and iliac limbs are patent. There is no obvious endoleak. Pre contrast and venous phase images are not included in this study. Celiac: Patent. SMA: Mild smooth soft plaque along the wall of the proximal SMA with some atherosclerotic calcification at the origin. No significant narrowing. Renals: A dominant right upper pole renal artery is patent. A lower pole accessory right renal artery does not opacify with contrast likely due to exclusion by the stent graft. A single left renal artery is patent. IMA: Origin has been sacrificed. RIGHT Lower Extremity Inflow: Right common iliac landing zone is patent, distal and nonaneurysmal. There  are atherosclerotic changes in the right internal and external iliac arteries without significant narrowing. Outflow: Right common femoral artery is patent with minimal atherosclerotic changes. Profunda femoral artery is patent. The right superficial femoral artery is mildly diffusely diseased but without significant focal narrowing. Runoff: The right popliteal artery is partially obscured by motion artifact. There is 50% narrowing above the knee joint due to smooth soft plaque on image 274 of series 6. There is grossly 2 vessel runoff via the right posterior tibial and peroneal arteries. LEFT Lower Extremity Inflow: Left common iliac landing zone is distal common nonaneurysmal, and patent. Left external iliac artery is patent with mild diffuse atherosclerotic changes. There is significant narrowing at the origin of the left internal iliac artery. Outflow: Left common femoral artery is patent and mildly disease. The profunda femoral artery is patent. The origin of the left superficial femoral artery is diminutive. This vessel is diffusely diminutive and moderately diseased without significant  focal narrowing. Runoff: The popliteal artery is also obscured partially due to motion artifact. It is grossly patent. There is 1 vessel runoff via the left posterior tibial artery. Review of the MIP images confirms the above findings. NON-VASCULAR Lower chest: Small right pleural effusion. Subsegmental atelectasis at the dependent lung bases. Cardiomegaly. Hepatobiliary: Post cholecystectomy. There is a very small rim calcified area measuring less than 1 cm in the gallbladder fossa of unknown significance which may be related to postoperative changes or fat necrosis in surgical bed. Liver is otherwise within normal limits. Pancreas: Unremarkable Spleen: Unremarkable Adrenals/Urinary Tract: There is severe atrophy of both kidneys. There is a large calculus in the left kidney measuring 1.5 cm. There are hyperdense lesions in the lower pole of the left kidney which are multiple. The largest measures 2.6 cm on image 73. It is associated with a rim calcification. This is a nonspecific finding and may represent a hyperdense complex cyst or an enhancing mass. The bladder contains hyperdense material representing either hemorrhage or contrast. Stomach/Bowel: No obvious mass in the colon. Small bowel is decompressed. Stomach contains enteric contents. Lymphatic: There is no abnormal retroperitoneal adenopathy. Reproductive: Normal prostate. Other: There is moderate free fluid within the pelvis. There is a very small amount of fluid anterior to the liver. Musculoskeletal: No vertebral compression deformity. Bilateral L5 pars defects with grade 1 spondylolisthesis measuring 7 mm. It is associated with severe degenerative disc disease at L5-S1. There is a large mass in the calf just deep to the Achilles tendon measuring 3.9 x 6.5 x 12.9 cm. It is somewhat heterogeneous and relatively low-density. Hounsfield unit measurements are well above simple fluid at 43. It is associated with calcifications. IMPRESSION: VASCULAR Motion  artifact limits this study. The popliteal arteries were partially obscured but are grossly patent. Aorto bi-iliac stent graft is patent excluding an aortic aneurysm. There is no obvious endoleak. Maximal aneurysm sac diameter is 6.2 cm. There are no prior studies available to assess for interval growth. No significant narrowing in the right or left external iliac arteries. There is significant narrowing at the origin of the left internal iliac artery. Right common femoral and femoral arteries are patent with atherosclerotic disease. There is 50% narrowing in the right popliteal artery above the knee. Two vessel runoff to the right ankle. Left common femoral artery is patent. The left superficial femoral artery is diffusely moderately disease without significant focal narrowing. There is 1 vessel runoff to the left ankle via the posterior tibial artery. NON-VASCULAR Small right pleural effusion.  Bibasilar  atelectasis. Postcholecystectomy. Calcified area in the gallbladder bed likely is associated with postoperative change. Left nephrolithiasis. There are hypodense lesions in the lower pole of the left kidney which are nonspecific. Differential diagnosis includes hyperdense cyst or enhancing mass. MRI may be helpful to further characterize. If MRI is not feasible, ultrasound may be helpful to characterize these areas as cystic. Hyperdense material in the bladder is nonspecific. Differential diagnosis includes excreted contrast or hemorrhage. Moderate free fluid in the pelvis. Very minimal fluid in the abdomen about the liver. Large mass within the left calf as described. Differential diagnosis includes nonspecific fluid, hemorrhage, or enhancing neoplasm. Ultrasound or MRI may be helpful. Electronically Signed   By: Marybelle Killings M.D.   On: 12/28/2018 07:47   Vas [place] Burnard Bunting With/wo Tbi  Result Date: 12/27/2018 LOWER EXTREMITY DOPPLER STUDY Indications: Pain, discoloration.  Performing Technologist: Maudry Mayhew  MHA, RVT, RDCS, RDMS  Examination Guidelines: A complete evaluation includes at minimum, Doppler waveform signals and systolic blood pressure reading at the level of bilateral brachial, anterior tibial, and posterior tibial arteries, when vessel segments are accessible. Bilateral testing is considered an integral part of a complete examination. Photoelectric Plethysmograph (PPG) waveforms and toe systolic pressure readings are included as required and additional duplex testing as needed. Limited examinations for reoccurring indications may be performed as noted.  ABI Findings: +---------+------------------+-----+----------+--------+ Right    Rt Pressure (mmHg)IndexWaveform  Comment  +---------+------------------+-----+----------+--------+ Brachial 136                    triphasic          +---------+------------------+-----+----------+--------+ PTA      94                0.69 monophasic         +---------+------------------+-----+----------+--------+ DP       98                0.72 monophasic         +---------+------------------+-----+----------+--------+ Great Toe36                0.26                    +---------+------------------+-----+----------+--------+ +--------+------------------+-----+----------+-------+ Left    Lt Pressure (mmHg)IndexWaveform  Comment +--------+------------------+-----+----------+-------+ [id number]                    triphasic         +--------+------------------+-----+----------+-------+ PTA     91                0.67 monophasic        +--------+------------------+-----+----------+-------+ DP      116               0.85 monophasic        +--------+------------------+-----+----------+-------+ +-------+-----------+---------------------+------------+---------------------+ ABI/TBIToday's ABIToday's TBI          Previous ABIPrevious TBI          +-------+-----------+---------------------+------------+---------------------+ Right   0.72       0.26                 0.98        Not obtained          +-------+-----------+---------------------+------------+---------------------+ Left   0.85       Unable to be obtained0.53        Unable to be obtained +-------+-----------+---------------------+------------+---------------------+ Right ABIs appear decreased compared to prior study on 12/08/2018.  Summary: Right: Resting right ankle-brachial index  indicates moderate right lower extremity arterial disease. The right toe-brachial index is abnormal. Left: Resting left ankle-brachial index indicates mild left lower extremity arterial disease. Unable to obtain left great toe pressure due to transmetatarsal amputation x50 years post truck falling on his foot. Given anatomic distortion to the left foot and ankle, Doppler interrogation and results can be difficult to duplicate.  *See table(s) above for measurements and observations.  Electronically signed by Ruta Hinds MD on 12/27/2018 at 5:03:01 PM.   Final    Vas [place] Lower Extremity Venous (dvt)  Result Date: 12/27/2018  Lower Venous Study Indications: Pain, Swelling, and Erythema.  Performing Technologist: Maudry Mayhew MHA, RDMS, RVT, RDCS  Examination Guidelines: A complete evaluation includes B-mode imaging, spectral Doppler, color Doppler, and power Doppler as needed of all accessible portions of each vessel. Bilateral testing is considered an integral part of a complete examination. Limited examinations for reoccurring indications may be performed as noted.  Right Venous Findings: +---------+---------------+---------+-----------+----------+--------------+          CompressibilityPhasicitySpontaneityPropertiesSummary        +---------+---------------+---------+-----------+----------+--------------+ CFV      Full           No       Yes                  pulsatile flow +---------+---------------+---------+-----------+----------+--------------+ SFJ      Full                                                         +---------+---------------+---------+-----------+----------+--------------+ FV Prox  Full                                                        +---------+---------------+---------+-----------+----------+--------------+ FV Mid   Full                                                        +---------+---------------+---------+-----------+----------+--------------+ FV DistalFull                                                        +---------+---------------+---------+-----------+----------+--------------+ PFV      Full                                                        +---------+---------------+---------+-----------+----------+--------------+ POP      Full           No       Yes                  pulsatile flow +---------+---------------+---------+-----------+----------+--------------+ PTV      Full                                                        +---------+---------------+---------+-----------+----------+--------------+  PERO     Full                                                        +---------+---------------+---------+-----------+----------+--------------+  Left Venous Findings: +---+---------------+---------+-----------+----------+--------------+    CompressibilityPhasicitySpontaneityPropertiesSummary        +---+---------------+---------+-----------+----------+--------------+ CFVFull           No       Yes                  pulsatile flow +---+---------------+---------+-----------+----------+--------------+    Summary: Right: There is no evidence of deep vein thrombosis in the lower extremity. No cystic structure found in the popliteal fossa. Left: No evidence of common femoral vein obstruction.  *See table(s) above for measurements and observations. Electronically signed by Ruta Hinds MD on 12/27/2018 at 5:03:22 PM.    Final    US Abdomen Limited Ruq  Result Date:  12/26/2018 CLINICAL DATA:  Abnormal liver function tests. EXAM: ULTRASOUND ABDOMEN LIMITED RIGHT UPPER QUADRANT COMPARISON:  None. FINDINGS: Gallbladder: Status post cholecystectomy. Common bile duct: Diameter: 7 mm which is within normal limits for post cholecystectomy status. Liver: 8 mm left hepatic cyst is noted. Within normal limits in parenchymal echogenicity. Portal vein is patent on color Doppler imaging with normal direction of blood flow towards the liver. IMPRESSION: Status post cholecystectomy. Small left hepatic cyst. No other abnormality seen in the right upper quadrant of the abdomen. Electronically Signed   By: Marijo Conception, M.D.   On: 12/26/2018 22:27     Medications:   . sodium chloride    . sodium chloride    . sodium chloride     . amiodarone  200 mg Oral Daily  . atorvastatin  40 mg Oral QHS  . Chlorhexidine Gluconate Cloth  6 each Topical Q0600  . isosorbide mononitrate  30 mg Oral Daily  . metoprolol succinate  50 mg Oral Daily  . multivitamin  1 tablet Oral Daily  . pantoprazole  40 mg Oral QAC breakfast  . sodium chloride flush  3 mL Intravenous Q12H  . umeclidinium-vilanterol  1 puff Inhalation Daily   sodium chloride, sodium chloride, sodium chloride, acetaminophen **OR** acetaminophen, albuterol, alteplase, heparin, HYDROcodone-acetaminophen, metoprolol tartrate, ondansetron **OR** ondansetron (ZOFRAN) IV, sodium chloride flush

## 2018-12-28 NOTE — Progress Notes (Signed)
Lab work ordered per MD

## 2018-12-28 NOTE — Progress Notes (Signed)
Patient with 9 beats of wide QRS, patient asleep, asymptomatic. Notified MD Antonieta Pert.

## 2018-12-29 ENCOUNTER — Encounter (HOSPITAL_COMMUNITY): Payer: Self-pay | Admitting: Nephrology

## 2018-12-29 ENCOUNTER — Inpatient Hospital Stay (HOSPITAL_COMMUNITY): Payer: Medicare Other

## 2018-12-29 ENCOUNTER — Inpatient Hospital Stay: Payer: Self-pay | Admitting: Critical Care Medicine

## 2018-12-29 DIAGNOSIS — D649 Anemia, unspecified: Secondary | ICD-10-CM

## 2018-12-29 LAB — CBC
HCT: 40 % (ref 39.0–52.0)
Hemoglobin: 12.4 g/dL — ABNORMAL LOW (ref 13.0–17.0)
MCH: 29.2 pg (ref 26.0–34.0)
MCHC: 31 g/dL (ref 30.0–36.0)
MCV: 94.1 fL (ref 80.0–100.0)
Platelets: 36 10*3/uL — ABNORMAL LOW (ref 150–400)
RBC: 4.25 MIL/uL (ref 4.22–5.81)
RDW: 20.8 % — ABNORMAL HIGH (ref 11.5–15.5)
WBC: 6.7 10*3/uL (ref 4.0–10.5)
nRBC: 0 % (ref 0.0–0.2)

## 2018-12-29 LAB — RENAL FUNCTION PANEL
Albumin: 3.7 g/dL (ref 3.5–5.0)
Anion gap: 18 — ABNORMAL HIGH (ref 5–15)
BUN: 42 mg/dL — ABNORMAL HIGH (ref 8–23)
CO2: 21 mmol/L — ABNORMAL LOW (ref 22–32)
Calcium: 9 mg/dL (ref 8.9–10.3)
Chloride: 100 mmol/L (ref 98–111)
Creatinine, Ser: 8.81 mg/dL — ABNORMAL HIGH (ref 0.61–1.24)
GFR calc Af Amer: 7 mL/min — ABNORMAL LOW (ref >60)
GFR calc non Af Amer: 6 mL/min — ABNORMAL LOW (ref >60)
Glucose, Bld: 89 mg/dL (ref 70–99)
Phosphorus: 6.4 mg/dL — ABNORMAL HIGH (ref 2.5–4.6)
Potassium: 4.6 mmol/L (ref 3.5–5.1)
Sodium: 139 mmol/L (ref 135–145)

## 2018-12-29 LAB — TYPE AND SCREEN
ABO/RH(D): O POS
Antibody Screen: NEGATIVE

## 2018-12-29 MED ORDER — LIDOCAINE-PRILOCAINE 2.5-2.5 % EX CREA
1.0000 "application " | TOPICAL_CREAM | CUTANEOUS | Status: DC | PRN
  Filled 2018-12-29: qty 5

## 2018-12-29 MED ORDER — BISMUTH SUBSALICYLATE 262 MG/15ML PO SUSP
30.0000 mL | Freq: Four times a day (QID) | ORAL | Status: DC | PRN
  Administered 2019-01-12: 30 mL via ORAL
  Filled 2018-12-29 (×2): qty 236

## 2018-12-29 MED ORDER — LIDOCAINE HCL (PF) 1 % IJ SOLN: 5.0000 mL | INTRAMUSCULAR | Status: DC | PRN

## 2018-12-29 MED ORDER — SODIUM CHLORIDE 0.9 % IV SOLN: 100.0000 mL | INTRAVENOUS | Status: DC | PRN

## 2018-12-29 MED ORDER — HEPARIN SODIUM (PORCINE) 1000 UNIT/ML DIALYSIS
1000.0000 [IU] | INTRAMUSCULAR | Status: DC | PRN
  Administered 2018-12-29: 3900 [IU] via INTRAVENOUS_CENTRAL
  Filled 2018-12-29 (×2): qty 1

## 2018-12-29 MED ORDER — PREDNISONE 50 MG PO TABS
60.0000 mg | ORAL_TABLET | Freq: Every day | ORAL | Status: DC
  Administered 2018-12-30 – 2018-12-31 (×2): 60 mg via ORAL
  Filled 2018-12-29 (×2): qty 1

## 2018-12-29 MED ORDER — ALBUTEROL SULFATE (2.5 MG/3ML) 0.083% IN NEBU
2.5000 mg | INHALATION_SOLUTION | Freq: Three times a day (TID) | RESPIRATORY_TRACT | Status: DC
  Administered 2018-12-29 – 2019-01-10 (×28): 2.5 mg via RESPIRATORY_TRACT
  Filled 2018-12-29 (×28): qty 3

## 2018-12-29 MED ORDER — PENTAFLUOROPROP-TETRAFLUOROETH EX AERO: 1.0000 "application " | INHALATION_SPRAY | CUTANEOUS | Status: DC | PRN

## 2018-12-29 MED ORDER — ALTEPLASE 2 MG IJ SOLR
2.0000 mg | Freq: Once | INTRAMUSCULAR | Status: AC | PRN
  Administered 2018-12-29: 4 mg

## 2018-12-29 MED ORDER — ALTEPLASE 2 MG IJ SOLR
INTRAMUSCULAR | Status: AC
  Filled 2018-12-29: qty 4

## 2018-12-29 MED ORDER — HEPARIN SODIUM (PORCINE) 1000 UNIT/ML IJ SOLN
INTRAMUSCULAR | Status: AC
  Administered 2018-12-29: 3900 [IU] via INTRAVENOUS_CENTRAL
  Filled 2018-12-29: qty 4

## 2018-12-29 MED ORDER — ACETAMINOPHEN 325 MG PO TABS
ORAL_TABLET | ORAL | Status: AC
  Filled 2018-12-29: qty 2

## 2018-12-29 NOTE — Progress Notes (Addendum)
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 fib with RVR, chronic thrombocytopenia, anemia, COPD, hypertension admitted on 12/23/2018 with c/o chest pain, dyspnea on exertion a few days initially presented to [facility] ER where CTA chest  negative for PE or any acute findings, discussed with [facility],  and transferred to [facility], was apparently also tested for low risk COVID-19. IN ER, p/w A fib, rate 100s, PVC, no ST change WBC 10.2, Hgb 12.9, platelet 83, INR 1.6, ABG 7.51 / 27 / 99 , Na 133, K 4.7, Cr 5.4, proBNP 66100, AST 355, ALT 295, CRP 41.4, Procalcitonin 0.52, Ferritin 938, Troponin 0.27. Patient was seen by cardiology, nephrology, managed for CAD/unstable angina, with heparin drip. Per cardiology felt he is not a candidate for surgical or percutaneous revascularization (previous CABG, thrombocytopenia). Cardiology added Imdur. He complains of swelling on the right lower extremity, , having difficulty for fluid removal due to hypotension post HD. Off heparin gtt,COVID neg and transferred to  [facility] on 4/12, and cardio signed off.  Patient had hyperpigmented skin changes in the right lower leg negative for DVT w decreased ABI on duplex- suspecting PVD and vascular surgery was consulted 4/12. He is being scheduled for arteriogram 4/16 by vascular.  Subjective: Sitting on the edge of the bed, on room air, denies nausea vomiting.  Waiting for his dialysis today.   Still complains of swelling of the right lower extremity with redness no pain or ulcer.    Assessment & Plan:  CAD with unstable angina, EKG without new ischemic changes, troponin elevation is flat and mild and recent nuclear stress test only showed a small  area of apical ischemia.  Seen by cardiology off heparin drip, cardiology feels is not a candidate for surgical or percutaneous revascularization advised to continue on medical therapy and signed of, Cont on Lipitor, metoprolol, Imdur and can increase Imdur to 60 mg if has ongoing chest pain  And if post HD BP allows.Not able to use aspirin due to his low platelet count.  Follow-up with cardiology as outpatient.  Acute on Chronic systolic CHF, w dyspnea on exertion, leg swelling: fluid is being managed in dialysis.  Continue Toprol 50. Not on acre/arb , he has renal dysfucntion/hypotension. monitor blood pressure.   ESRD on HD TTSS, last HD 4/11- hypotension limiting his ultrafiltration.  Also on Imdur and metoprolol dose may need to be adjusted to prevent hypotension.  For HD today, appreciate nephrology inputs.  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 No DVT in duplex. Vascular surgery consulted and s/p CTA 4/1, not a candidate for bypass.  Vascular planning for arteriogram on Thursday with platelet transfusion peri-Procedure.  Hypotension, postdialysis.  On Imdur and metoprolol. BP is stable currently. Watch in HD.  Persistent Atrial fibrillation:Rate controlled on amiodarone, metoprolol. Not on anticoagulation due to thrombocytopenia. Monitor w/ NSVT- monitor k and mag.  COPD:Stable.  Continue his nebulizer and anoro Ellipta.  COVID 19 is negative. No pneumonia or fever.resp status stable.  Chronic thrombocytopenia suspecting chronic ITP.  Has been low since January patient reports it has been very low. I see as  low as 30,000. Trend as below and slight uptrend. HITT was neg previous admission 3/27. He has been referred to outpatient hematology in [place].Currently no obvious bleeding and platelets are low but stable.  Hypodense lesion in the lower pole of the left kidney incidentally noted on  the CTA scan, nonspecific could be seen non specific- check US renal.    Large mass in the left calf incidentally noted on the CTA, will obtain ultrasound   Recent Labs  Lab 12/25/18 0306 12/26/18 0707 12/27/18 0442 12/28/18 0456 12/29/18 0807  PLT 42* 49* 33* 32* 36*   Mild transaminitis suspect due to CHF.  Acute hepatitis panel Neg.RUQ [place] s/p ccy, no acute findings,  Anemia of chronic renal disease.  Hemoglobin is stable.  no ESA as outpatient.  GERD on Protonix.  DVT prophylaxis: SCD Code Status: full Family Communication: patient Disposition Plan: Remains inpatient pending clinical improvement and clearance from vascular and nephrology.    Consultants:  Nephrology Cardiology Case discussed with hematology.  Procedures:  Antimicrobials: Anti-infectives (From admission, onward)   None       Objective: Vitals:   12/29/18 0407 12/29/18 0409 12/29/18 0838 12/29/18 0915  BP: 131/67  98/67   Pulse: 67  64   Resp: 20     Temp: 97.6 F (36.4 C)     TempSrc: Oral     SpO2: 98%   98%  Weight:  89.3 kg    Height:        Intake/Output Summary (Last 24 hours) at 12/29/2018 0954 Last data filed at 12/29/2018 0300 Gross per 24 hour  Intake 480 ml  Output 525 ml  Net -45 ml   Filed Weights   12/27/18 0414 12/28/18 0408 12/29/18 0409  Weight: 87.7 kg 88.5 kg 89.3 kg   Weight change: 0.816 kg  Body mass index is 27.45 kg/m.  Intake/Output from previous day: 04/13 0701 - 04/14 0700 In: 600 [P.O.:600] Out: 525 [Urine:525] Intake/Output this shift: No intake/output data recorded.  Examination: General exam: Calm, comfortable, not in acute distress, older for age, average built.  HEENT:Oral mucosa moist, Ear/Nose WNL grossly, dentition normal. Respiratory system: Bilateral equal air entry, no crackles and wheezing, no use of accessory muscle, non tender on palpation. Cardiovascular system: regular rate and rhythm, S1 & S2 heard, No  JVD/murmurs. Gastrointestinal system: Abdomen soft, non-tender, non-distended, BS +. No hepatosplenomegaly palpable. Nervous System:Alert, awake and oriented at baseline. Able to move UE and LE, sensation intact. Extremities: lt old TMA.  Hyperpigmented and swollen right ankle Skin: No rashes,no icterus. MSK: Normal muscle bulk,tone, power  Medications:  Scheduled Meds:  amiodarone  200 mg Oral Daily   atorvastatin  40 mg Oral QHS   Chlorhexidine Gluconate Cloth  6 each Topical Q0600   isosorbide mononitrate  30 mg Oral Daily   metoprolol succinate  50 mg Oral Daily   multivitamin  1 tablet Oral Daily   pantoprazole  40 mg Oral QAC breakfast   sodium chloride flush  3 mL Intravenous Q12H   umeclidinium-vilanterol  1 puff Inhalation Daily   Continuous Infusions:  sodium chloride     sodium chloride     sodium chloride      Data Reviewed: I have personally reviewed following labs and imaging studies  CBC: Recent Labs  Lab 12/25/18 0306 12/26/18 0707 12/27/18 0442 12/28/18 0456 12/29/18 0807  WBC 9.0 7.2 6.0 5.8 6.7  NEUTROABS 7.2 5.3 3.9  --   --   HGB 12.0* 11.3* 11.3*  11.3* 12.4*  HCT 37.9* 38.0* 37.9* 36.2* 40.0  MCV 92.0 94.5 94.5 93.8 94.1  PLT 42* 49* 33* 32* 36*   Basic Metabolic Panel: Recent Labs  Lab 12/25/18 0306 12/26/18 0707 12/27/18 0442 12/28/18 1022 12/29/18 0807  NA 137 140 137 138 139  K 3.7 4.8 4.5 4.1 4.6  CL 99 99 101 101 100  CO2 24 26 24 22  21*  GLUCOSE 104* 110* 94 127* 89  BUN 24* 37* 20 32* 42*  CREATININE 5.38* 7.73* 5.82* 7.72* 8.81*  CALCIUM 8.5* 8.8* 8.5* 8.5* 9.0  MG 2.0 2.2 2.0 2.1  --   PHOS 4.9* 6.6* 5.1*  --  6.4*   GFR: Estimated Creatinine Clearance: 9 mL/min (A) (by C-G formula based on SCr of 8.81 mg/dL (H)). Liver Function Tests: Recent Labs  Lab 12/25/18 0306 12/26/18 0707 12/27/18 0442 12/29/18 0807  AST 107* 60* 49*  --   ALT 228* 164* 133*  --   ALKPHOS 105 95 95  --   BILITOT 1.2 0.9 0.7   --   PROT 5.7* 5.4* 5.5*  --   ALBUMIN 3.3* 3.3* 3.3* 3.7   No results for input(s): LIPASE, AMYLASE in the last 168 hours. No results for input(s): AMMONIA in the last 168 hours. Coagulation Profile: No results for input(s): INR, PROTIME in the last 168 hours. Cardiac Enzymes: Recent Labs  Lab 12/23/18 1351 12/23/18 1944 12/24/18 0218  TROPONINI 0.26* 0.25* 0.23*   BNP (last 3 results) No results for input(s): PROBNP in the last 8760 hours. HbA1C: No results for input(s): HGBA1C in the last 72 hours. CBG: No results for input(s): GLUCAP in the last 168 hours. Lipid Profile: No results for input(s): CHOL, HDL, LDLCALC, TRIG, CHOLHDL, LDLDIRECT in the last 72 hours. Thyroid Function Tests: No results for input(s): TSH, T4TOTAL, FREET4, T3FREE, THYROIDAB in the last 72 hours. Anemia Panel: Recent Labs    12/27/18 0442  VITAMINB12 1,263*  FOLATE 23.0  FERRITIN 425*  TIBC 269  IRON 31*  RETICCTPCT 3.0   Sepsis Labs: No results for input(s): PROCALCITON, LATICACIDVEN in the last 168 hours.  No results found for this or any previous visit (from the past 240 hour(s)).    Radiology Studies: Ct Angio Ao+bifem W & Or Wo Contrast  Result Date: 12/28/2018 CLINICAL DATA:  Right foot ischemia EXAM: CT ANGIOGRAPHY OF ABDOMINAL AORTA WITH ILIOFEMORAL RUNOFF TECHNIQUE: Multidetector CT imaging of the abdomen, pelvis and lower extremities was performed using the standard protocol during bolus administration of intravenous contrast. Multiplanar CT image reconstructions and MIPs were obtained to evaluate the vascular anatomy. CONTRAST:  132mL OMNIPAQUE IOHEXOL 350 MG/ML SOLN COMPARISON:  None. FINDINGS: VASCULAR Aorta: Descending thoracic aorta is 4.2 cm in caliber. An aorto bi-iliac stent graft is in place excluding an aortic aneurysm. Maximal AP and transverse diameters are 6.2 and 5.5 cm. Aortic and iliac limbs are patent. There is no obvious endoleak. Pre contrast and venous phase  images are not included in this study. Celiac: Patent. SMA: Mild smooth soft plaque along the wall of the proximal SMA with some atherosclerotic calcification at the origin. No significant narrowing. Renals: A dominant right upper pole renal artery is patent. A lower pole accessory right renal artery does not opacify with contrast likely due to exclusion by the stent graft. A single left renal artery is patent. IMA: Origin has been sacrificed. RIGHT Lower Extremity Inflow: Right common iliac landing zone is patent, distal and nonaneurysmal. There are atherosclerotic changes in  the right internal and external iliac arteries without significant narrowing. Outflow: Right common femoral artery is patent with minimal atherosclerotic changes. Profunda femoral artery is patent. The right superficial femoral artery is mildly diffusely diseased but without significant focal narrowing. Runoff: The right popliteal artery is partially obscured by motion artifact. There is 50% narrowing above the knee joint due to smooth soft plaque on image 274 of series 6. There is grossly 2 vessel runoff via the right posterior tibial and peroneal arteries. LEFT Lower Extremity Inflow: Left common iliac landing zone is distal common nonaneurysmal, and patent. Left external iliac artery is patent with mild diffuse atherosclerotic changes. There is significant narrowing at the origin of the left internal iliac artery. Outflow: Left common femoral artery is patent and mildly disease. The profunda femoral artery is patent. The origin of the left superficial femoral artery is diminutive. This vessel is diffusely diminutive and moderately diseased without significant focal narrowing. Runoff: The popliteal artery is also obscured partially due to motion artifact. It is grossly patent. There is 1 vessel runoff via the left posterior tibial artery. Review of the MIP images confirms the above findings. NON-VASCULAR Lower chest: Small right pleural  effusion. Subsegmental atelectasis at the dependent lung bases. Cardiomegaly. Hepatobiliary: Post cholecystectomy. There is a very small rim calcified area measuring less than 1 cm in the gallbladder fossa of unknown significance which may be related to postoperative changes or fat necrosis in surgical bed. Liver is otherwise within normal limits. Pancreas: Unremarkable Spleen: Unremarkable Adrenals/Urinary Tract: There is severe atrophy of both kidneys. There is a large calculus in the left kidney measuring 1.5 cm. There are hyperdense lesions in the lower pole of the left kidney which are multiple. The largest measures 2.6 cm on image 73. It is associated with a rim calcification. This is a nonspecific finding and may represent a hyperdense complex cyst or an enhancing mass. The bladder contains hyperdense material representing either hemorrhage or contrast. Stomach/Bowel: No obvious mass in the colon. Small bowel is decompressed. Stomach contains enteric contents. Lymphatic: There is no abnormal retroperitoneal adenopathy. Reproductive: Normal prostate. Other: There is moderate free fluid within the pelvis. There is a very small amount of fluid anterior to the liver. Musculoskeletal: No vertebral compression deformity. Bilateral L5 pars defects with grade 1 spondylolisthesis measuring 7 mm. It is associated with severe degenerative disc disease at L5-S1. There is a large mass in the calf just deep to the Achilles tendon measuring 3.9 x 6.5 x 12.9 cm. It is somewhat heterogeneous and relatively low-density. Hounsfield unit measurements are well above simple fluid at 43. It is associated with calcifications. IMPRESSION: VASCULAR Motion artifact limits this study. The popliteal arteries were partially obscured but are grossly patent. Aorto bi-iliac stent graft is patent excluding an aortic aneurysm. There is no obvious endoleak. Maximal aneurysm sac diameter is 6.2 cm. There are no prior studies available to assess  for interval growth. No significant narrowing in the right or left external iliac arteries. There is significant narrowing at the origin of the left internal iliac artery. Right common femoral and femoral arteries are patent with atherosclerotic disease. There is 50% narrowing in the right popliteal artery above the knee. Two vessel runoff to the right ankle. Left common femoral artery is patent. The left superficial femoral artery is diffusely moderately disease without significant focal narrowing. There is 1 vessel runoff to the left ankle via the posterior tibial artery. NON-VASCULAR Small right pleural effusion.  Bibasilar atelectasis. Postcholecystectomy. Calcified area  in the gallbladder bed likely is associated with postoperative change. Left nephrolithiasis. There are hypodense lesions in the lower pole of the left kidney which are nonspecific. Differential diagnosis includes hyperdense cyst or enhancing mass. MRI may be helpful to further characterize. If MRI is not feasible, ultrasound may be helpful to characterize these areas as cystic. Hyperdense material in the bladder is nonspecific. Differential diagnosis includes excreted contrast or hemorrhage. Moderate free fluid in the pelvis. Very minimal fluid in the abdomen about the liver. Large mass within the left calf as described. Differential diagnosis includes nonspecific fluid, hemorrhage, or enhancing neoplasm. Ultrasound or MRI may be helpful. Electronically Signed   By: Marybelle Killings M.D.   On: 12/28/2018 07:47   Vas [place] Burnard Bunting With/wo Tbi  Result Date: 12/27/2018 LOWER EXTREMITY DOPPLER STUDY Indications: Pain, discoloration.  Performing Technologist: Maudry Mayhew MHA, RVT, RDCS, RDMS  Examination Guidelines: A complete evaluation includes at minimum, Doppler waveform signals and systolic blood pressure reading at the level of bilateral brachial, anterior tibial, and posterior tibial arteries, when vessel segments are accessible. Bilateral  testing is considered an integral part of a complete examination. Photoelectric Plethysmograph (PPG) waveforms and toe systolic pressure readings are included as required and additional duplex testing as needed. Limited examinations for reoccurring indications may be performed as noted.  ABI Findings: +---------+------------------+-----+----------+--------+  Right     Rt Pressure (mmHg) Index Waveform   Comment   +---------+------------------+-----+----------+--------+  Brachial  136                      triphasic            +---------+------------------+-----+----------+--------+  PTA       94                 0.69  monophasic           +---------+------------------+-----+----------+--------+  DP        98                 0.72  monophasic           +---------+------------------+-----+----------+--------+  Great Toe 36                 0.26                       +---------+------------------+-----+----------+--------+ +--------+------------------+-----+----------+-------+  Left     Lt Pressure (mmHg) Index Waveform   Comment  +--------+------------------+-----+----------+-------+  Brachial 125                      triphasic           +--------+------------------+-----+----------+-------+  PTA      91                 0.67  monophasic          +--------+------------------+-----+----------+-------+  DP       116                0.85  monophasic          +--------+------------------+-----+----------+-------+ +-------+-----------+---------------------+------------+---------------------+  ABI/TBI Today's ABI Today's TBI           Previous ABI Previous TBI           +-------+-----------+---------------------+------------+---------------------+  Right   0.72        0.26                  0.98  Not obtained           +-------+-----------+---------------------+------------+---------------------+  Left    0.85        Unable to be obtained 0.53         Unable to be obtained   +-------+-----------+---------------------+------------+---------------------+ Right ABIs appear decreased compared to prior study on 12/08/2018.  Summary: Right: Resting right ankle-brachial index indicates moderate right lower extremity arterial disease. The right toe-brachial index is abnormal. Left: Resting left ankle-brachial index indicates mild left lower extremity arterial disease. Unable to obtain left great toe pressure due to transmetatarsal amputation x50 years post truck falling on his foot. Given anatomic distortion to the left foot and ankle, Doppler interrogation and results can be difficult to duplicate.  *See table(s) above for measurements and observations.  Electronically signed by Ruta Hinds MD on 12/27/2018 at 5:03:01 PM.   Final    Vas [place] Lower Extremity Venous (dvt)  Result Date: 12/27/2018  Lower Venous Study Indications: Pain, Swelling, and Erythema.  Performing Technologist: Maudry Mayhew MHA, RDMS, RVT, RDCS  Examination Guidelines: A complete evaluation includes B-mode imaging, spectral Doppler, color Doppler, and power Doppler as needed of all accessible portions of each vessel. Bilateral testing is considered an integral part of a complete examination. Limited examinations for reoccurring indications may be performed as noted.  Right Venous Findings: +---------+---------------+---------+-----------+----------+--------------+            Compressibility Phasicity Spontaneity Properties Summary         +---------+---------------+---------+-----------+----------+--------------+  CFV       Full            No        Yes                    pulsatile flow  +---------+---------------+---------+-----------+----------+--------------+  SFJ       Full                                                             +---------+---------------+---------+-----------+----------+--------------+  FV Prox   Full                                                              +---------+---------------+---------+-----------+----------+--------------+  FV Mid    Full                                                             +---------+---------------+---------+-----------+----------+--------------+  FV Distal Full                                                             +---------+---------------+---------+-----------+----------+--------------+  PFV       Full                                                             +---------+---------------+---------+-----------+----------+--------------+  POP       Full            No        Yes                    pulsatile flow  +---------+---------------+---------+-----------+----------+--------------+  PTV       Full                                                             +---------+---------------+---------+-----------+----------+--------------+  PERO      Full                                                             +---------+---------------+---------+-----------+----------+--------------+  Left Venous Findings: +---+---------------+---------+-----------+----------+--------------+      Compressibility Phasicity Spontaneity Properties Summary         +---+---------------+---------+-----------+----------+--------------+  CFV Full            No        Yes                    pulsatile flow  +---+---------------+---------+-----------+----------+--------------+    Summary: Right: There is no evidence of deep vein thrombosis in the lower extremity. No cystic structure found in the popliteal fossa. Left: No evidence of common femoral vein obstruction.  *See table(s) above for measurements and observations. Electronically signed by Ruta Hinds MD on 12/27/2018 at 5:03:22 PM.    Final       LOS: 5 days   Time spent: More than 50% of that time was spent in counseling and/or coordination of care.  Antonieta Pert, MD [facility]  12/29/2018, 9:54 AM

## 2018-12-29 NOTE — Progress Notes (Signed)
[facility] ROUNDING NOTE   Subjective:   This is a 42 gentleman Tuesday Thursday Saturday dialysis at [facility].  He has history of coronary disease and COPD he was admitted with chest pain and is ruled out.  COVID-19 test is ruled out.  Abnormal LFTs that are slowly trending down.  Right upper quadrant ultrasound and hepatitis panel ordered.    Ultrasound 12/26/2018 showed status post cholecystectomy with small left hepatic cyst.   Today pt is w/o any new c/o's, seen in pt's room.   Objective:  Vital signs in last 24 hours:  Temp:  [97.6 F (36.4 C)-98.1 F (36.7 C)] 97.8 F (36.6 C) (04/14 1155) Pulse Rate:  [47-73] 65 (04/14 1155) Resp:  [16-20] 18 (04/14 1155) BP: (98-131)/(67-82) 117/82 (04/14 1155) SpO2:  [97 %-98 %] 98 % (04/14 1155) Weight:  [89.3 kg] 89.3 kg (04/14 0409)  Weight change: 0.816 kg Filed Weights   12/27/18 0414 12/28/18 0408 12/29/18 0409  Weight: 87.7 kg 88.5 kg 89.3 kg    Intake/Output: I/O last 3 completed shifts: In: 700 [P.O.:700] Out: 527 [Urine:527]   Intake/Output this shift:  No intake/output data recorded.  Exam Alert , Ox 3 No jvd CVS-regular rate and rhythm with no murmurs rubs or gallops RS- mild scattered exp wheezing bilat, no rales or ^wob, L IJ cath  ABD- BS present soft non-distended EXT- no LE edema, R foot/ lower leg erythematous, tender. L foot TMA is wrapped.  L RC AVF+bruit NF, Ox 3   TTS HD  4h  400/800  92.5kg  3K/2.25 bath   LIJ TDC/ L RC AVF (created 09/18/18) Hep none - calcitriol 0.5 tiw  Assessment/ Plan:   R foot erythema , possible ischemia - VVS planning arteriogram 4/16. Will schedule HD around the procedure.    ESRD- TTS HD. HD today.  Under dry wt. No vol^ on exam.   Chest pain appreciate assistance from [name].  Concerned about the cardiac catheterization in setting of thrombocytopenia.  Had recent Myoview with fixed defect.  History of CABG 2017.  No plans to proceed  with cath and PCI at this time.  This is due to the thrombocytopenia risk of bleeding.  Continues on Imdur 30 mg daily  Congestive heart failure with severely reduced systolic function 20 to 86% EF.  Thrombocytopenia- appears to be chronic and has been present since January he had a heparin-induced antibody that was negative in 12/11/2018.  Appreciate assistance from 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  He can be followed up as an outpatient.  BP - BP's up some, good, 120/80 on metoprolol xl 50 and imdur 30 mg. Let volume / BP come up some, he is under dry wt 4kg  Anemia ckd:  stable does not appear to be an issue at this time no ESA's as an outpatient  Metabolic bone disease phosphorus appears to be in good control will continue to follow  Hyperlipidemia continues on atorvastatin  GERD continues on Protonix  Atrial fibrillation rate controlled no anticoagulation secondary thrombocytopenia TSH 2.734    LOS: 4 Sandy Salaam Nicholle Falzon @TODAY @1 :41 PM   Basic Metabolic Panel: Recent Labs  Lab 12/25/18 0306 12/26/18 0707 12/27/18 0442 12/28/18 1022 12/29/18 0807  NA 137 140 137 138 139  K 3.7 4.8 4.5 4.1 4.6  CL 99 99 101 101 100  CO2 24 26 24 22  21*  GLUCOSE 104* 110* 94 127* 89  BUN 24* 37* 20 32* 42*  CREATININE 5.38* 7.73* 5.82* 7.72* 8.81*  CALCIUM 8.5* 8.8* 8.5* 8.5* 9.0  MG 2.0 2.2 2.0 2.1  --   PHOS 4.9* 6.6* 5.1*  --  6.4*    Liver Function Tests: Recent Labs  Lab 12/25/18 0306 12/26/18 0707 12/27/18 0442 12/29/18 0807  AST 107* 60* 49*  --   ALT 228* 164* 133*  --   ALKPHOS 105 95 95  --   BILITOT 1.2 0.9 0.7  --   PROT 5.7* 5.4* 5.5*  --   ALBUMIN 3.3* 3.3* 3.3* 3.7   No results for input(s): LIPASE, AMYLASE in the last 168 hours. No results for input(s): AMMONIA in the last 168 hours.  CBC: Recent Labs  Lab 12/25/18 0306 12/26/18 0707 12/27/18 0442 12/28/18 0456 12/29/18 0807  WBC 9.0 7.2 6.0 5.8 6.7  NEUTROABS 7.2 5.3 3.9  --   --    HGB 12.0* 11.3* 11.3* 11.3* 12.4*  HCT 37.9* 38.0* 37.9* 36.2* 40.0  MCV 92.0 94.5 94.5 93.8 94.1  PLT 42* 49* 33* 32* 36*    Cardiac Enzymes: Recent Labs  Lab 12/23/18 1351 12/23/18 1944 12/24/18 0218  TROPONINI 0.26* 0.25* 0.23*    BNP: Invalid input(s): POCBNP  CBG: No results for input(s): GLUCAP in the last 168 hours.  Microbiology: Results for orders placed or performed during the hospital encounter of 12/07/18  MRSA PCR Screening     Status: None   Collection Time: 12/07/18  9:07 PM  Result Value Ref Range Status   MRSA by PCR NEGATIVE NEGATIVE Final    Comment:        The GeneXpert MRSA Assay (FDA approved for NASAL specimens only), is one component of a comprehensive MRSA colonization surveillance program. It is not intended to diagnose MRSA infection nor to guide or monitor treatment for MRSA infections. Performed at [facility], [place] [address], [place], [place] [address]     Coagulation Studies: No results for input(s): LABPROT, INR in the last 72 hours.  Urinalysis: No results for input(s): COLORURINE, LABSPEC, PHURINE, GLUCOSEU, HGBUR, BILIRUBINUR, KETONESUR, PROTEINUR, UROBILINOGEN, NITRITE, LEUKOCYTESUR in the last 72 hours.  Invalid input(s): APPERANCEUR    Imaging: Ct Angio Ao+bifem W & Or Wo Contrast  Result Date: 12/28/2018 CLINICAL DATA:  Right foot ischemia EXAM: CT ANGIOGRAPHY OF ABDOMINAL AORTA WITH ILIOFEMORAL RUNOFF TECHNIQUE: Multidetector CT imaging of the abdomen, pelvis and lower extremities was performed using the standard protocol during bolus administration of intravenous contrast. Multiplanar CT image reconstructions and MIPs were obtained to evaluate the vascular anatomy. CONTRAST:  144mL OMNIPAQUE IOHEXOL 350 MG/ML SOLN COMPARISON:  None. FINDINGS: VASCULAR Aorta: Descending thoracic aorta is 4.2 cm in caliber. An aorto bi-iliac stent graft is in place excluding an aortic aneurysm. Maximal AP and transverse diameters are  6.2 and 5.5 cm. Aortic and iliac limbs are patent. There is no obvious endoleak. Pre contrast and venous phase images are not included in this study. Celiac: Patent. SMA: Mild smooth soft plaque along the wall of the proximal SMA with some atherosclerotic calcification at the origin. No significant narrowing. Renals: A dominant right upper pole renal artery is patent. A lower pole accessory right renal artery does not opacify with contrast likely due to exclusion by the stent graft. A single left renal artery is patent. IMA: Origin has been sacrificed. RIGHT Lower Extremity Inflow: Right common iliac landing zone is patent, distal and nonaneurysmal. There are atherosclerotic changes in the right internal and external iliac arteries without significant narrowing.  Outflow: Right common femoral artery is patent with minimal atherosclerotic changes. Profunda femoral artery is patent. The right superficial femoral artery is mildly diffusely diseased but without significant focal narrowing. Runoff: The right popliteal artery is partially obscured by motion artifact. There is 50% narrowing above the knee joint due to smooth soft plaque on image 274 of series 6. There is grossly 2 vessel runoff via the right posterior tibial and peroneal arteries. LEFT Lower Extremity Inflow: Left common iliac landing zone is distal common nonaneurysmal, and patent. Left external iliac artery is patent with mild diffuse atherosclerotic changes. There is significant narrowing at the origin of the left internal iliac artery. Outflow: Left common femoral artery is patent and mildly disease. The profunda femoral artery is patent. The origin of the left superficial femoral artery is diminutive. This vessel is diffusely diminutive and moderately diseased without significant focal narrowing. Runoff: The popliteal artery is also obscured partially due to motion artifact. It is grossly patent. There is 1 vessel runoff via the left posterior tibial  artery. Review of the MIP images confirms the above findings. NON-VASCULAR Lower chest: Small right pleural effusion. Subsegmental atelectasis at the dependent lung bases. Cardiomegaly. Hepatobiliary: Post cholecystectomy. There is a very small rim calcified area measuring less than 1 cm in the gallbladder fossa of unknown significance which may be related to postoperative changes or fat necrosis in surgical bed. Liver is otherwise within normal limits. Pancreas: Unremarkable Spleen: Unremarkable Adrenals/Urinary Tract: There is severe atrophy of both kidneys. There is a large calculus in the left kidney measuring 1.5 cm. There are hyperdense lesions in the lower pole of the left kidney which are multiple. The largest measures 2.6 cm on image 73. It is associated with a rim calcification. This is a nonspecific finding and may represent a hyperdense complex cyst or an enhancing mass. The bladder contains hyperdense material representing either hemorrhage or contrast. Stomach/Bowel: No obvious mass in the colon. Small bowel is decompressed. Stomach contains enteric contents. Lymphatic: There is no abnormal retroperitoneal adenopathy. Reproductive: Normal prostate. Other: There is moderate free fluid within the pelvis. There is a very small amount of fluid anterior to the liver. Musculoskeletal: No vertebral compression deformity. Bilateral L5 pars defects with grade 1 spondylolisthesis measuring 7 mm. It is associated with severe degenerative disc disease at L5-S1. There is a large mass in the calf just deep to the Achilles tendon measuring 3.9 x 6.5 x 12.9 cm. It is somewhat heterogeneous and relatively low-density. Hounsfield unit measurements are well above simple fluid at 43. It is associated with calcifications. IMPRESSION: VASCULAR Motion artifact limits this study. The popliteal arteries were partially obscured but are grossly patent. Aorto bi-iliac stent graft is patent excluding an aortic aneurysm. There is  no obvious endoleak. Maximal aneurysm sac diameter is 6.2 cm. There are no prior studies available to assess for interval growth. No significant narrowing in the right or left external iliac arteries. There is significant narrowing at the origin of the left internal iliac artery. Right common femoral and femoral arteries are patent with atherosclerotic disease. There is 50% narrowing in the right popliteal artery above the knee. Two vessel runoff to the right ankle. Left common femoral artery is patent. The left superficial femoral artery is diffusely moderately disease without significant focal narrowing. There is 1 vessel runoff to the left ankle via the posterior tibial artery. NON-VASCULAR Small right pleural effusion.  Bibasilar atelectasis. Postcholecystectomy. Calcified area in the gallbladder bed likely is associated with postoperative change.  Left nephrolithiasis. There are hypodense lesions in the lower pole of the left kidney which are nonspecific. Differential diagnosis includes hyperdense cyst or enhancing mass. MRI may be helpful to further characterize. If MRI is not feasible, ultrasound may be helpful to characterize these areas as cystic. Hyperdense material in the bladder is nonspecific. Differential diagnosis includes excreted contrast or hemorrhage. Moderate free fluid in the pelvis. Very minimal fluid in the abdomen about the liver. Large mass within the left calf as described. Differential diagnosis includes nonspecific fluid, hemorrhage, or enhancing neoplasm. Ultrasound or MRI may be helpful. Electronically Signed   By: Marybelle Killings M.D.   On: 12/28/2018 07:47   Vas [place] Burnard Bunting With/wo Tbi  Result Date: 12/27/2018 LOWER EXTREMITY DOPPLER STUDY Indications: Pain, discoloration.  Performing Technologist: Maudry Mayhew MHA, RVT, RDCS, RDMS  Examination Guidelines: A complete evaluation includes at minimum, Doppler waveform signals and systolic blood pressure reading at the level of  bilateral brachial, anterior tibial, and posterior tibial arteries, when vessel segments are accessible. Bilateral testing is considered an integral part of a complete examination. Photoelectric Plethysmograph (PPG) waveforms and toe systolic pressure readings are included as required and additional duplex testing as needed. Limited examinations for reoccurring indications may be performed as noted.  ABI Findings: +---------+------------------+-----+----------+--------+ Right    Rt Pressure (mmHg)IndexWaveform  Comment  +---------+------------------+-----+----------+--------+ Brachial 136                    triphasic          +---------+------------------+-----+----------+--------+ PTA      94                0.69 monophasic         +---------+------------------+-----+----------+--------+ DP       98                0.72 monophasic         +---------+------------------+-----+----------+--------+ Great Toe36                0.26                    +---------+------------------+-----+----------+--------+ +--------+------------------+-----+----------+-------+ Left    Lt Pressure (mmHg)IndexWaveform  Comment +--------+------------------+-----+----------+-------+ [id number]                    triphasic         +--------+------------------+-----+----------+-------+ PTA     91                0.67 monophasic        +--------+------------------+-----+----------+-------+ DP      116               0.85 monophasic        +--------+------------------+-----+----------+-------+ +-------+-----------+---------------------+------------+---------------------+ ABI/TBIToday's ABIToday's TBI          Previous ABIPrevious TBI          +-------+-----------+---------------------+------------+---------------------+ Right  0.72       0.26                 0.98        Not obtained          +-------+-----------+---------------------+------------+---------------------+ Left   0.85        Unable to be obtained0.53        Unable to be obtained +-------+-----------+---------------------+------------+---------------------+ Right ABIs appear decreased compared to prior study on 12/08/2018.  Summary: Right: Resting right ankle-brachial index indicates moderate right lower extremity arterial disease. The right toe-brachial index is abnormal. Left:  Resting left ankle-brachial index indicates mild left lower extremity arterial disease. Unable to obtain left great toe pressure due to transmetatarsal amputation x50 years post truck falling on his foot. Given anatomic distortion to the left foot and ankle, Doppler interrogation and results can be difficult to duplicate.  *See table(s) above for measurements and observations.  Electronically signed by Ruta Hinds MD on 12/27/2018 at 5:03:01 PM.   Final    Vas [place] Lower Extremity Venous (dvt)  Result Date: 12/27/2018  Lower Venous Study Indications: Pain, Swelling, and Erythema.  Performing Technologist: Maudry Mayhew MHA, RDMS, RVT, RDCS  Examination Guidelines: A complete evaluation includes B-mode imaging, spectral Doppler, color Doppler, and power Doppler as needed of all accessible portions of each vessel. Bilateral testing is considered an integral part of a complete examination. Limited examinations for reoccurring indications may be performed as noted.  Right Venous Findings: +---------+---------------+---------+-----------+----------+--------------+          CompressibilityPhasicitySpontaneityPropertiesSummary        +---------+---------------+---------+-----------+----------+--------------+ CFV      Full           No       Yes                  pulsatile flow +---------+---------------+---------+-----------+----------+--------------+ SFJ      Full                                                        +---------+---------------+---------+-----------+----------+--------------+ FV Prox  Full                                                         +---------+---------------+---------+-----------+----------+--------------+ FV Mid   Full                                                        +---------+---------------+---------+-----------+----------+--------------+ FV DistalFull                                                        +---------+---------------+---------+-----------+----------+--------------+ PFV      Full                                                        +---------+---------------+---------+-----------+----------+--------------+ POP      Full           No       Yes                  pulsatile flow +---------+---------------+---------+-----------+----------+--------------+ PTV      Full                                                        +---------+---------------+---------+-----------+----------+--------------+  PERO     Full                                                        +---------+---------------+---------+-----------+----------+--------------+  Left Venous Findings: +---+---------------+---------+-----------+----------+--------------+    CompressibilityPhasicitySpontaneityPropertiesSummary        +---+---------------+---------+-----------+----------+--------------+ CFVFull           No       Yes                  pulsatile flow +---+---------------+---------+-----------+----------+--------------+    Summary: Right: There is no evidence of deep vein thrombosis in the lower extremity. No cystic structure found in the popliteal fossa. Left: No evidence of common femoral vein obstruction.  *See table(s) above for measurements and observations. Electronically signed by Ruta Hinds MD on 12/27/2018 at 5:03:22 PM.    Final      Medications:   . sodium chloride    . sodium chloride    . sodium chloride     . amiodarone  200 mg Oral Daily  . atorvastatin  40 mg Oral QHS  . Chlorhexidine Gluconate Cloth  6 each Topical Q0600  .  isosorbide mononitrate  30 mg Oral Daily  . metoprolol succinate  50 mg Oral Daily  . multivitamin  1 tablet Oral Daily  . pantoprazole  40 mg Oral QAC breakfast  . [START ON 12/30/2018] predniSONE  60 mg Oral Q breakfast  . sodium chloride flush  3 mL Intravenous Q12H  . umeclidinium-vilanterol  1 puff Inhalation Daily   sodium chloride, sodium chloride, sodium chloride, acetaminophen **OR** acetaminophen, albuterol, alteplase, bismuth subsalicylate, heparin, heparin, HYDROcodone-acetaminophen, lidocaine (PF), lidocaine-prilocaine, metoprolol tartrate, ondansetron **OR** ondansetron (ZOFRAN) IV, pentafluoroprop-tetrafluoroeth, sodium chloride flush

## 2018-12-29 NOTE — TOC Initial Note (Signed)
Transition of Care [facility]) - Initial/Assessment Note    Patient Details  Name: Larry Klein MRN: [id number] Date of Birth: 1956/05/02  [facility] CM/SW Contact:    Sherrilyn Rist Phone Number: [phone] 12/29/2018, 2:00 PM  Clinical Narrative:                 Patient lives at home, goes to the [facility] for primary care; has private insurance with Medicare; CM following for progression of care.  Expected Discharge Plan: Home/Self Care Barriers to Discharge: No Barriers Identified   Patient Goals and CMS Choice Patient states their goals for this hospitalization and ongoing recovery are:: to get better CMS Medicare.gov Compare Post Acute Care list provided to:: Patient Choice offered to / list presented to : NA  Expected Discharge Plan and Services Expected Discharge Plan: Home/Self Care In-house Referral: NA Discharge Planning Services: CM Consult Post Acute Care Choice: NA Living arrangements for the past 2 months: Single Family Home                 DME Arranged: N/A DME Agency: NA HH Arranged: NA HH Agency: NA  Prior Living Arrangements/Services Living arrangements for the past 2 months: Single Family Home Lives with:: Self Patient language and need for interpreter reviewed:: No Do you feel safe going back to the place where you live?: Yes      Need for Family Participation in Patient Care: No (Comment) Care giver support system in place?: Yes (comment)   Criminal Activity/Legal Involvement Pertinent to Current Situation/Hospitalization: No - Comment as needed  Activities of Daily Living Home Assistive Devices/Equipment: Cane (specify quad or straight), Built-in shower seat ADL Screening (condition at time of admission) Patient's cognitive ability adequate to safely complete daily activities?: Yes Is the patient deaf or have difficulty hearing?: No Does the patient have difficulty seeing, even when wearing  glasses/contacts?: No Does the patient have difficulty concentrating, remembering, or making decisions?: No Patient able to express need for assistance with ADLs?: Yes Does the patient have difficulty dressing or bathing?: No Independently performs ADLs?: Yes (appropriate for developmental age) Does the patient have difficulty walking or climbing stairs?: Yes Weakness of Legs: None Weakness of Arms/Hands: None  Permission Sought/Granted Permission sought to share information with : Case Manager                Emotional Assessment Appearance:: Developmentally appropriate Attitude/Demeanor/Rapport: (pt was appropriate) Affect (typically observed): Accepting Orientation: : Oriented to Self, Oriented to  Time, Oriented to Situation Alcohol / Substance Use: Not Applicable Psych Involvement: No (comment)  Admission diagnosis:  CHF DOE RENAL FAILURE Patient Active Problem List   Diagnosis Date Noted  . Severe left ventricular systolic dysfunction   . Cardiomyopathy ([place])   . COPD with acute exacerbation ([place]) 12/11/2018  . Acute on chronic systolic heart failure ([place]) 12/11/2018  . Chest pain 12/08/2018  . Atrial fibrillation with RVR ([place]) 12/07/2018  . Atrial fibrillation ([place]) 11/12/2018  . Thrombocytopenia ([place]) 09/20/2018  . ESRD (end stage renal disease) on dialysis ([place])   . Hepatomegaly   . Dyspnea 09/17/2018   PCP:  Patient, No Pcp Per Pharmacy:   [facility] [place], [place] AT [address] [facility] [address] Phone: [phone] Fax: [phone]     Social Determinants of Health (SDOH) Interventions    Readmission Risk Interventions No flowsheet data found.

## 2018-12-29 NOTE — Consult Note (Addendum)
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         +---------+---------------+---------+-----------+----------+--------------+  CFV       Full            No        Yes                    pulsatile flow  +---------+---------------+---------+-----------+----------+--------------+  SFJ       Full                                                             +---------+---------------+---------+-----------+----------+--------------+  FV Prox   Full                                                             +---------+---------------+---------+-----------+----------+--------------+  FV Mid    Full                                                             +---------+---------------+---------+-----------+----------+--------------+  FV Distal Full                                                             +---------+---------------+---------+-----------+----------+--------------+  PFV       Full                                                             +---------+---------------+---------+-----------+----------+--------------+  POP       Full             No        Yes                    pulsatile flow  +---------+---------------+---------+-----------+----------+--------------+  PTV       Full                                                             +---------+---------------+---------+-----------+----------+--------------+  PERO      Full                                                             +---------+---------------+---------+-----------+----------+--------------+  Left Venous Findings: +---+---------------+---------+-----------+----------+--------------+      Compressibility Phasicity Spontaneity Properties Summary         +---+---------------+---------+-----------+----------+--------------+  CFV Full            No        Yes                    pulsatile flow  +---+---------------+---------+-----------+----------+--------------+    Summary: Right: There is no evidence of deep vein thrombosis in the lower extremity. No cystic structure found in the popliteal fossa. Left: No evidence of common femoral vein obstruction.  *See table(s) above for measurements and observations. Electronically signed by Ruta Hinds MD on 12/27/2018 at 5:03:22 PM.    Final    US Abdomen Limited Ruq  Result Date: 12/26/2018 CLINICAL DATA:  Abnormal liver function tests. EXAM: ULTRASOUND ABDOMEN LIMITED RIGHT UPPER QUADRANT COMPARISON:  None. FINDINGS: Gallbladder: Status post cholecystectomy. Common bile duct: Diameter: 7 mm which is within normal limits for post cholecystectomy status. Liver: 8 mm left hepatic cyst is noted. Within normal limits in parenchymal echogenicity. Portal vein is patent on color Doppler imaging with normal direction of blood flow towards the liver. IMPRESSION: Status post cholecystectomy. Small left hepatic cyst. No other abnormality seen in the right upper quadrant of the abdomen. Electronically Signed   By: Marijo Conception, M.D.   On: 12/26/2018 22:27    ASSESSMENT AND PLAN:  This is a 63 year old male with a long-standing history of  thrombocytopenia. 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 He also has anemia which could be related to his chronic kidney disease.  Patient was also noted to have a large mass in his left calf.  1. Begin prednisone 60 mg daily.  Monitor platelet count closely.  Vascular surgery is planning to administer platelets prior to his lower extremity arteriogram scheduled for Thursday.  However, this may not be needed if he responds to the prednisone.  We can arrange for outpatient follow-up with hematology in [place]. 2.  His anemia is relatively stable.  Suspect this is related to his chronic kidney disease.  Not currently on ESAs.  Iron studies, folate, and vitamin B12 are not deficient. Transfuse if his hemoglobin is less than 7.0. 3.  Etiology of the large mass  in his left calf is unclear.  May need to biopsy this in the near future.  Thank you for this referral.  Larry Bussing, DNP, AGPCNP-BC, AOCNP  Mr. Meleski was interviewed and examined.  I reviewed his treatment course over the past several months.  I reviewed the peripheral blood smear and imaging studies.  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  He has a history of thrombocytopenia dating at least to January of this year and by his report the thrombocytopenia is chronic.  There have been several falls in the platelet count while hospitalized at [facility].  I have a low clinical suspicion for HIT and serotonin release/antibody testing for HIT has been negative.  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  It appears the platelet count increased when he was placed on prednisone during a February 2020 hospital admission.  I recommend a trial of prednisone as a diagnostic/therapeutic maneuver.  If the platelet count rises over the next few days he may be  able to undergo interventional procedures without transfusion support.  I see no absolute contraindication to anticoagulation therapy if recommended by cardiology or vascular surgery, though he will be at increased risk for bleeding if the platelet count remains in the 30-40,000 range.  We can consider a trial of pulse Decadron in an attempt to obtain a long-term remission if the thrombocytopenia response to prednisone.  The anemia is likely secondary to renal failure, but the peripheral blood smear findings suggest the possibility of an underlying bone marrow process such as myelodysplasia.  This can be evaluated further if the hemoglobin falls.  I will review the CT images to assess the left calf mass and consider the need for further evaluation.  Hematology will continue following Mr. Lelon Frohlich while he is in the hospital.  Outpatient follow-up will be scheduled at the [facility] cancer center.

## 2018-12-30 MED ORDER — GABAPENTIN 600 MG PO TABS
300.0000 mg | ORAL_TABLET | Freq: Every day | ORAL | Status: DC
  Administered 2018-12-31 – 2019-01-20 (×20): 300 mg via ORAL
  Filled 2018-12-30 (×20): qty 1

## 2018-12-30 MED ORDER — CHLORHEXIDINE GLUCONATE CLOTH 2 % EX PADS: 6.0000 | MEDICATED_PAD | Freq: Every day | CUTANEOUS | Status: DC

## 2018-12-30 MED ORDER — GABAPENTIN 600 MG PO TABS: 300.0000 mg | ORAL_TABLET | Freq: Two times a day (BID) | ORAL | Status: DC

## 2018-12-30 MED ORDER — DIAZEPAM 5 MG PO TABS
10.0000 mg | ORAL_TABLET | Freq: Once | ORAL | Status: AC
  Administered 2018-12-30: 01:00:00 10 mg via ORAL
  Filled 2018-12-30: qty 2

## 2018-12-30 NOTE — Progress Notes (Signed)
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 fib with RVR, chronic thrombocytopenia, anemia, COPD, hypertension admitted on 12/23/2018 with c/o chest pain, dyspnea on exertion a few days initially presented to [facility] ER where CTA chest  negative for PE or any acute findings, discussed with [facility]iology,  and transferred to [facility], was apparently also tested for low risk COVID-19. IN ER, p/w A fib, rate 100s, PVC, no ST change WBC 10.2, Hgb 12.9, platelet 83, INR 1.6, ABG 7.51 / 27 / 99 , Na 133, K 4.7, Cr 5.4, proBNP 66100, AST 355, ALT 295, CRP 41.4, Procalcitonin 0.52, Ferritin 938, Troponin 0.27. Patient was seen by cardiology, nephrology, managed for CAD/unstable angina, with heparin drip. Per cardiology felt he is not a candidate for surgical or percutaneous revascularization (previous CABG, thrombocytopenia). Cardiology added Imdur. He complains of swelling on the right lower extremity, , having difficulty for fluid removal due to hypotension post HD. Off heparin gtt,COVID neg and transferred to  [place] on 4/12, and cardio signed off.  Patient had hyperpigmented skin changes in the right lower leg negative for DVT w decreased ABI on duplex- suspecting PVD and vascular surgery was consulted 4/12. He is being scheduled for arteriogram 4/16 by vascular.   HPI/Recap of past 24 hours:  Sitting up at the edge of the bed, reports leg pain has not changed, no fever, no hypoxia  Assessment/Plan: Principal Problem:   Chest pain Active Problems:   Dyspnea   ESRD (end stage renal disease) on dialysis [facility])   Atrial fibrillation with RVR (HCC)   Acute on chronic systolic heart failure (HCC)   Cardiomyopathy ([place])   CAD  with unstable angina, EKG without new ischemic changes, troponin elevation is flat and mild and recent nuclear stress test only showed a small area of apical ischemia.  Seen by cardiology off heparin drip, cardiology feels is not a candidate for surgical or percutaneous revascularization advised to continue on medical therapy and signed of, Cont on Lipitor, metoprolol, Imdur and can increase Imdur to 60 mg if has ongoing chest pain  And if post HD BP allows.Not able to use aspirin due to his low platelet count.  Follow-up with cardiology as outpatient.  Acute on Chronic systolic CHF, w dyspnea on exertion, leg swelling: fluid is being managed in dialysis.  Continue Toprol 50. Not on acre/arb , he has renal dysfucntion/hypotension. monitor blood pressure.   Mild transaminitis suspect due to CHF.  Acute hepatitis panel Neg.RUQ [place] s/p ccy, no acute findings,  ESRD on HD TTSS, last HD 4/11- hypotension limiting his ultrafiltration.  Also on Imdur and metoprolol dose may need to be adjusted to prevent hypotension.  For HD today, appreciate nephrology inputs. Anemia of chronic renal disease.  Hemoglobin is stable.  no ESA as outpatient. Hypotension, postdialysis.  On Imdur and metoprolol. BP is stable currently. Watch in HD.  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 No DVT in duplex. Vascular surgery consulted and s/p CTA 4/1, not a candidate for bypass.  Vascular planning for arteriogram on Thursday with platelet transfusion peri-Procedure. start trial of neurontin  Persistent Atrial fibrillation:Rate  controlled on amiodarone, metoprolol. Not on anticoagulation due to thrombocytopenia. Monitor w/ NSVT- monitor k and mag.  COPD:Stable.  Continue his nebulizer and anoro Ellipta.  COVID 19 is negative. No pneumonia or fever.resp status stable.  Chronic thrombocytopenia suspecting chronic ITP.  Has  been low since January patient reports it has been very low. I see as low as 30,000. Trend as below and slight uptrend. HITT was neg previous admission 3/27. He has been referred to outpatient hematology in [place].Currently no obvious bleeding and platelets are low but stable. Oncology started patient on steroids  Hypodense lesion in the lower pole of the left kidney incidentally noted on the CTA scan, nonspecific could be seen non specific- cT US renal.   "Atrophic kidneys bilaterally similar to that seen on prior CT.  Lower pole nonobstructing stone on the left.  Cystic lesions in the left kidney which correspond to the hyperdense lesions on prior CT."  Large mass in the left calf incidentally noted on the CTA,  Left lower extremity ultrasoundThere was no significant vascularity noted and could represent a posttraumatic hematoma.  An MRI was recommended.  We will consider additional work-up of this area as an outpatient. Defer to oncology    GERD on Protonix.    Code Status: full  Family Communication: patient   Disposition Plan: not ready to discharge   Consultants:  Nephrology for dialysis  Vascular surgery for possible ischemic leg   Oncology for thrombocytopenia, lung mass, left leg mass  Cardiology for chest pain/cad  Procedures:  Angiogram right lower extremity  Antibiotics:  none   Objective: BP 127/83 (BP Location: Left Arm)   Pulse 78   Temp 97.8 F (36.6 C) (Oral)   Resp 20   Ht 5\' 11"  (1.803 m)   Wt 90.5 kg   SpO2 99%   BMI 27.84 kg/m   Intake/Output Summary (Last 24 hours) at 12/30/2018 1346 Last data filed at 12/30/2018 0858 Gross per 24 hour  Intake 723 ml  Output 0 ml  Net 723 ml   Filed Weights   12/29/18 1400 12/29/18 1915 12/30/18 0121  Weight: 90.2 kg 90.2 kg 90.5 kg    Exam: Patient is examined daily including today on 12/30/2018, exams remain the same as of yesterday except that has changed    General:  NAD   Cardiovascular: RRR  Respiratory: CTABL  Abdomen: Soft/ND/NT, positive BS  Musculoskeletal: right foot/lower leg erythematous, tender, left foot h/o TMA  Many years ago  Neuro: alert, oriented   Data Reviewed: Basic Metabolic Panel: Recent Labs  Lab 12/25/18 0306 12/26/18 0707 12/27/18 0442 12/28/18 1022 12/29/18 0807  NA 137 140 137 138 139  K 3.7 4.8 4.5 4.1 4.6  CL 99 99 101 101 100  CO2 24 26 24 22  21*  GLUCOSE 104* 110* 94 127* 89  BUN 24* 37* 20 32* 42*  CREATININE 5.38* 7.73* 5.82* 7.72* 8.81*  CALCIUM 8.5* 8.8* 8.5* 8.5* 9.0  MG 2.0 2.2 2.0 2.1  --   PHOS 4.9* 6.6* 5.1*  --  6.4*   Liver Function Tests: Recent Labs  Lab 12/25/18 0306 12/26/18 0707 12/27/18 0442 12/29/18 0807  AST 107* 60* 49*  --   ALT 228* 164* 133*  --   ALKPHOS 105 95 95  --   BILITOT 1.2 0.9 0.7  --   PROT 5.7* 5.4* 5.5*  --   ALBUMIN 3.3* 3.3* 3.3* 3.7   No results for input(s): LIPASE, AMYLASE in the last 168  hours. No results for input(s): AMMONIA in the last 168 hours. CBC: Recent Labs  Lab 12/25/18 0306 12/26/18 0707 12/27/18 0442 12/28/18 0456 12/29/18 0807  WBC 9.0 7.2 6.0 5.8 6.7  NEUTROABS 7.2 5.3 3.9  --   --   HGB 12.0* 11.3* 11.3* 11.3* 12.4*  HCT 37.9* 38.0* 37.9* 36.2* 40.0  MCV 92.0 94.5 94.5 93.8 94.1  PLT 42* 49* 33* 32* 36*   Cardiac Enzymes:   Recent Labs  Lab 12/23/18 1351 12/23/18 1944 12/24/18 0218  TROPONINI 0.26* 0.25* 0.23*   BNP (last 3 results) Recent Labs    12/07/18 2237  BNP 1,637.3*    ProBNP (last 3 results) No results for input(s): PROBNP in the last 8760 hours.  CBG: No results for input(s): GLUCAP in the last 168 hours.  No results found for this or any previous visit (from the past 240 hour(s)).   Studies: US Renal  Result Date: 12/30/2018 CLINICAL DATA:  Possible mass on recent CT examination EXAM: RENAL / URINARY TRACT ULTRASOUND COMPLETE COMPARISON:  None. FINDINGS: Right Kidney: Renal measurements: 8.2 x 3.5 x  3.7 cm = volume: 57 mL. Diffuse cortical thinning is noted. No focal mass or hydronephrosis is noted. Left Kidney: Renal measurements: 10.1 x 4.7 x 3.3 cm = volume: 84 mL. Cortical thinning is noted. Triangular shaped calcification is noted in the lower pole measuring 1.3 cm in greatest dimension similar to that seen on prior CT examination. Cystic lesions are noted which correspond to the hyperdense lesion seen on prior CT examination. Peripheral calcifications are noted similar to that seen on prior CT examination. Bladder: Decompressed IMPRESSION: Atrophic kidneys bilaterally similar to that seen on prior CT. Lower pole nonobstructing stone on the left. Cystic lesions in the left kidney which correspond to the hyperdense lesions on prior CT. Electronically Signed   By: Inez Catalina M.D.   On: 12/30/2018 02:11   [place] Lt Lower Extrem Ltd Soft Tissue Non Vascular  Result Date: 12/30/2018 CLINICAL DATA:  Possible left calf mass EXAM: ULTRASOUND LEFT LOWER EXTREMITY LIMITED TECHNIQUE: Ultrasound examination of the lower extremity soft tissues was performed in the area of clinical concern. COMPARISON:  12/27/2018 FINDINGS: Scanning in the area of clinical concern reveals a complex predominantly solid mass lesion measuring approximately 11.5 x 6.0 cm. This corresponds to that seen on recent CT examination. No significant vascularity is noted within. IMPRESSION: Soft tissue mass lesion as described. No significant vascularity is noted within. This may represent a posttraumatic hematoma. Correlation with the clinical history is recommended. MRI would be helpful for further characterization. Electronically Signed   By: Inez Catalina M.D.   On: 12/30/2018 02:06    Scheduled Meds: . albuterol  2.5 mg Nebulization TID  . amiodarone  200 mg Oral Daily  . atorvastatin  40 mg Oral QHS  . Chlorhexidine Gluconate Cloth  6 each Topical Q0600  . isosorbide mononitrate  30 mg Oral Daily  . metoprolol succinate  50 mg Oral  Daily  . multivitamin  1 tablet Oral Daily  . pantoprazole  40 mg Oral QAC breakfast  . predniSONE  60 mg Oral Q breakfast  . sodium chloride flush  3 mL Intravenous Q12H  . umeclidinium-vilanterol  1 puff Inhalation Daily    Continuous Infusions: . sodium chloride       Time spent: 1mins I have personally reviewed and interpreted on  12/30/2018 daily labs, tele strips, imagings as discussed above under date review session and assessment and plans.  I reviewed all nursing notes, pharmacy notes, consultant notes,  vitals, pertinent old records  I have discussed plan of care as described above with RN , patient  on 12/30/2018   Florencia Reasons MD, PhD  [facility] Pager [phone]. If 7PM-7AM, please contact night-coverage at [email or web address], password [facility] 12/30/2018, 1:46 PM  LOS: 6 days

## 2018-12-30 NOTE — Progress Notes (Addendum)
HEMATOLOGY-ONCOLOGY PROGRESS NOTE  SUBJECTIVE: Patient feels well today.  Denies bleeding.  Still has ongoing discomfort in his bilateral lower extremities.  Denies chest pain he reports that his breathing is better.  REVIEW OF SYSTEMS:   The rest of the 14-point review of systems was negative except as noted in the HPI.   I have reviewed the past medical history, past surgical history, social history and family history with the patient and they are unchanged from previous note.   PHYSICAL EXAMINATION:  Vitals:   12/30/18 0935 12/30/18 1214  BP:  127/83  Pulse: 72 78  Resp: 20 20  Temp:  97.8 F (36.6 C)  SpO2: 98% 99%   Filed Weights   12/29/18 1400 12/29/18 1915 12/30/18 0121  Weight: 198 lb 13.7 oz (90.2 kg) 198 lb 13.7 oz (90.2 kg) 199 lb 9.6 oz (90.5 kg)    GENERAL:alert, no distress and comfortable SKIN: No petechiae. Multiple ecchymotic areas to bilateral arms, chronic discoloration at the lower legs with erythema over the right foot. EYES: normal, Conjunctiva are pink and non-injected, sclera clear OROPHARYNX:no exudate, no erythema and lips, buccal mucosa, and tongue normal  NECK: supple, thyroid normal size, non-tender, without nodularity LYMPH:  no palpable lymphadenopathy in the cervical, axillary or inguinal LUNGS: Diminished breath sounds bilaterally without wheezing or crackles.  HEART: Irregular, S1/S2, without murmur, rub or gallop. Extremities: hyperpigmentation of the right lower extremity. Large mass noted in the left lower calf. Has some tenderness when palpated. ABDOMEN:abdomen soft, non-tender and normal bowel sounds Musculoskeletal:no cyanosis of digits and no clubbing  NEURO: alert & oriented x 3 with fluent speech, no focal motor/sensory deficits  LABORATORY DATA:  I have reviewed the data as listed CMP Latest Ref Rng & Units 12/29/2018 12/28/2018 12/27/2018  Glucose 70 - 99 mg/dL 89 127(H) 94  BUN 8 - 23 mg/dL 42(H) 32(H) 20  Creatinine 0.61 - 1.24  mg/dL 8.81(H) 7.72(H) 5.82(H)  Sodium 135 - 145 mmol/L 139 138 137  Potassium 3.5 - 5.1 mmol/L 4.6 4.1 4.5  Chloride 98 - 111 mmol/L 100 101 101  CO2 22 - 32 mmol/L 21(L) 22 24  Calcium 8.9 - 10.3 mg/dL 9.0 8.5(L) 8.5(L)  Total Protein 6.5 - 8.1 g/dL - - 5.5(L)  Total Bilirubin 0.3 - 1.2 mg/dL - - 0.7  Alkaline Phos 38 - 126 U/L - - 95  AST 15 - 41 U/L - - 49(H)  ALT 0 - 44 U/L - - 133(H)    Lab Results  Component Value Date   WBC 6.7 12/29/2018   HGB 12.4 (L) 12/29/2018   HCT 40.0 12/29/2018   MCV 94.1 12/29/2018   PLT 36 (L) 12/29/2018   NEUTROABS 3.9 12/27/2018    ASSESSMENT AND PLAN: This is a 63 year old male with a long-standing history of thrombocytopenia. 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 He also has anemia which could be related to his chronic kidney disease.  Patient was also noted to have a large mass in his left calf.  Ultrasound of the left lower extremity a solid mass lesion measuring 11.5 x 6.0 cm.  There is no significant vascularity.  This could represent a posttraumatic hematoma.  1.  Continue prednisone 60 mg daily.  Recheck a CBC in the morning.  If his platelet count rises, he may be able to undergo interventional procedures without transfusion support.   2.  His anemia  has remained mild and likely due to his kidney disease.  Peripheral smear did suggest the possibility of an underlying bone marrow process such as myelodysplasia.  This can be evaluated further if his hemoglobin falls.  Recheck hemoglobin in the morning. 3.  Etiology of the left calf mass is unclear.  An ultrasound of his left lower extremity was performed yesterday.  There was no significant vascularity noted and could represent a posttraumatic hematoma.  An MRI was recommended.  We will consider additional work-up of this area as an outpatient.  Hematology will continue to follow the patient he is in the  hospital.  We will arrange for outpatient follow-up at the [facility] cancer center.  Larry Bussing, DNP, AGPCNP-BC, AOCNP  Larry Klein was interviewed and examined.  We will check the platelet count in the morning prior to the vascular procedure and decide on the need for transfusion.  I will review [place] with radiology to see if a noncontrast MRI would help confirm etiology of calf mass.  He reports the mass has been present for months, making a hematoma less likely.

## 2018-12-30 NOTE — Progress Notes (Signed)
I spoke with the patient regarding arteriogram tomorrow.  He will be n.p.o. after midnight.  Decision for platelet transfusion will be made by [name] in the morning   Annamarie Major

## 2018-12-30 NOTE — Progress Notes (Signed)
[facility] ROUNDING NOTE   Subjective:   This is a 57 gentleman Tuesday Thursday Saturday dialysis at [facility].  He has history of coronary disease and COPD he was admitted with chest pain and is ruled out.  COVID-19 test is ruled out.  Abnormal LFTs that are slowly trending down.  Right upper quadrant ultrasound and hepatitis panel ordered.    Ultrasound 12/26/2018 showed status post cholecystectomy with small left hepatic cyst.   Update daily:  today is resting in no distress, no new c/o's.    Objective:  Vital signs in last 24 hours:  Temp:  [97.6 F (36.4 C)-98.3 F (36.8 C)] 98 F (36.7 C) (04/15 0900) Pulse Rate:  [50-87] 72 (04/15 0935) Resp:  [13-24] 20 (04/15 0935) BP: (97-158)/(68-114) 121/76 (04/15 0900) SpO2:  [94 %-100 %] 98 % (04/15 0935) Weight:  [90.2 kg-90.5 kg] 90.5 kg (04/15 0121)  Weight change: 0.932 kg Filed Weights   12/29/18 1400 12/29/18 1915 12/30/18 0121  Weight: 90.2 kg 90.2 kg 90.5 kg    Intake/Output: I/O last 3 completed shifts: In: 363 [P.O.:360; I.V.:3] Out: 225 [Urine:225]   Intake/Output this shift:  Total I/O In: 480 [P.O.:480] Out: -   Exam Alert , Ox 3 No jvd CVS-regular rate and rhythm with no murmurs rubs or gallops RS- mild scattered exp wheezing bilat, no rales or ^wob, L IJ cath  ABD- BS present soft non-distended EXT- no LE edema, R foot/ lower leg erythematous, tender. L foot TMA is wrapped.  L RC AVF+bruit NF, Ox 3   TTS HD  4h  400/800  92.5kg  3K/2.25 bath   [facility] TDC/ L RC AVF (created 09/18/18) Hep none - calcitriol 0.5 tiw   Assessment/ Plan:   R foot erythema/ possible ischemia - hx L TMA.  VVS planning arteriogram 4/16    ESRD- TTS HD. HD tomorrow.   Chest pain - seen by Dr Debara Pickett. Concerned about the cardiac catheterization in setting of thrombocytopenia.  Had recent Myoview with fixed defect.  History of CABG 2017.  No plans to proceed with cath and PCI at this time due to the  thrombocytopenia and risk of bleeding.  Continues on Imdur 30 mg daily  Congestive heart failure with severely reduced systolic function 20 to 06% EF.  Thrombocytopenia- appears to be chronic and has been present since January he had a heparin-induced antibody that was negative in 12/11/2018.  Appreciate assistance from 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  He can be followed up as an outpatient.  BP/volume  - BP's up some, good, 120/80 on metoprolol xl 50 and imdur 30 mg. Has lost some body wt. euvolemic on exam  Anemia ckd:  stable does not appear to be an issue at this time no ESA's as an outpatient  Metabolic bone disease phosphorus appears to be in good control will continue to follow  Hyperlipidemia continues on atorvastatin  GERD continues on Protonix  Atrial fibrillation rate controlled no anticoagulation secondary thrombocytopenia TSH 2.734    LOS: 4 Sol Blazing @TODAY @1 :41 PM   Basic Metabolic Panel: Recent Labs  Lab 12/25/18 0306 12/26/18 0707 12/27/18 0442 12/28/18 1022 12/29/18 0807  NA 137 140 137 138 139  K 3.7 4.8 4.5 4.1 4.6  CL 99 99 101 101 100  CO2 24 26 24 22  21*  GLUCOSE 104* 110* 94 127* 89  BUN 24* 37* 20 32* 42*  CREATININE 5.38* 7.73* 5.82* 7.72* 8.81*  CALCIUM  8.5* 8.8* 8.5* 8.5* 9.0  MG 2.0 2.2 2.0 2.1  --   PHOS 4.9* 6.6* 5.1*  --  6.4*    Liver Function Tests: Recent Labs  Lab 12/25/18 0306 12/26/18 0707 12/27/18 0442 12/29/18 0807  AST 107* 60* 49*  --   ALT 228* 164* 133*  --   ALKPHOS 105 95 95  --   BILITOT 1.2 0.9 0.7  --   PROT 5.7* 5.4* 5.5*  --   ALBUMIN 3.3* 3.3* 3.3* 3.7   No results for input(s): LIPASE, AMYLASE in the last 168 hours. No results for input(s): AMMONIA in the last 168 hours.  CBC: Recent Labs  Lab 12/25/18 0306 12/26/18 0707 12/27/18 0442 12/28/18 0456 12/29/18 0807  WBC 9.0 7.2 6.0 5.8 6.7  NEUTROABS 7.2 5.3 3.9  --   --   HGB 12.0* 11.3* 11.3* 11.3* 12.4*  HCT 37.9* 38.0*  37.9* 36.2* 40.0  MCV 92.0 94.5 94.5 93.8 94.1  PLT 42* 49* 33* 32* 36*    Cardiac Enzymes: Recent Labs  Lab 12/23/18 1351 12/23/18 1944 12/24/18 0218  TROPONINI 0.26* 0.25* 0.23*    BNP: Invalid input(s): POCBNP  CBG: No results for input(s): GLUCAP in the last 168 hours.  Microbiology: Results for orders placed or performed during the hospital encounter of 12/07/18  MRSA PCR Screening     Status: None   Collection Time: 12/07/18  9:07 PM  Result Value Ref Range Status   MRSA by PCR NEGATIVE NEGATIVE Final    Comment:        The GeneXpert MRSA Assay (FDA approved for NASAL specimens only), is one component of a comprehensive MRSA colonization surveillance program. It is not intended to diagnose MRSA infection nor to guide or monitor treatment for MRSA infections. Performed at [facility], [place] [address] [place], [place] [address]     Coagulation Studies: No results for input(s): LABPROT, INR in the last 72 hours.  Urinalysis: No results for input(s): COLORURINE, LABSPEC, PHURINE, GLUCOSEU, HGBUR, BILIRUBINUR, KETONESUR, PROTEINUR, UROBILINOGEN, NITRITE, LEUKOCYTESUR in the last 72 hours.  Invalid input(s): APPERANCEUR    Imaging: US Renal  Result Date: 12/30/2018 CLINICAL DATA:  Possible mass on recent CT examination EXAM: RENAL / URINARY TRACT ULTRASOUND COMPLETE COMPARISON:  None. FINDINGS: Right Kidney: Renal measurements: 8.2 x 3.5 x 3.7 cm = volume: 57 mL. Diffuse cortical thinning is noted. No focal mass or hydronephrosis is noted. Left Kidney: Renal measurements: 10.1 x 4.7 x 3.3 cm = volume: 84 mL. Cortical thinning is noted. Triangular shaped calcification is noted in the lower pole measuring 1.3 cm in greatest dimension similar to that seen on prior CT examination. Cystic lesions are noted which correspond to the hyperdense lesion seen on prior CT examination. Peripheral calcifications are noted similar to that seen on prior CT examination.  Bladder: Decompressed IMPRESSION: Atrophic kidneys bilaterally similar to that seen on prior CT. Lower pole nonobstructing stone on the left. Cystic lesions in the left kidney which correspond to the hyperdense lesions on prior CT. Electronically Signed   By: Inez Catalina M.D.   On: 12/30/2018 02:11   [place] Lt Lower Extrem Ltd Soft Tissue Non Vascular  Result Date: 12/30/2018 CLINICAL DATA:  Possible left calf mass EXAM: ULTRASOUND LEFT LOWER EXTREMITY LIMITED TECHNIQUE: Ultrasound examination of the lower extremity soft tissues was performed in the area of clinical concern. COMPARISON:  12/27/2018 FINDINGS: Scanning in the area of clinical concern reveals a complex predominantly solid mass lesion measuring  approximately 11.5 x 6.0 cm. This corresponds to that seen on recent CT examination. No significant vascularity is noted within. IMPRESSION: Soft tissue mass lesion as described. No significant vascularity is noted within. This may represent a posttraumatic hematoma. Correlation with the clinical history is recommended. MRI would be helpful for further characterization. Electronically Signed   By: Inez Catalina M.D.   On: 12/30/2018 02:06     Medications:   . sodium chloride     . albuterol  2.5 mg Nebulization TID  . amiodarone  200 mg Oral Daily  . atorvastatin  40 mg Oral QHS  . Chlorhexidine Gluconate Cloth  6 each Topical Q0600  . isosorbide mononitrate  30 mg Oral Daily  . metoprolol succinate  50 mg Oral Daily  . multivitamin  1 tablet Oral Daily  . pantoprazole  40 mg Oral QAC breakfast  . predniSONE  60 mg Oral Q breakfast  . sodium chloride flush  3 mL Intravenous Q12H  . umeclidinium-vilanterol  1 puff Inhalation Daily   sodium chloride, acetaminophen **OR** acetaminophen, albuterol, bismuth subsalicylate, HYDROcodone-acetaminophen, metoprolol tartrate, ondansetron **OR** ondansetron (ZOFRAN) IV, sodium chloride flush

## 2018-12-30 NOTE — Telephone Encounter (Signed)
Patient has f/u appt on 01/01/2019

## 2018-12-31 ENCOUNTER — Encounter (HOSPITAL_COMMUNITY)
Admission: EM | Disposition: A | Discharge status: Home / Self Care | Payer: Self-pay | Source: Other Acute Inpatient Hospital | Attending: Internal Medicine

## 2018-12-31 DIAGNOSIS — I739 Peripheral vascular disease, unspecified: Secondary | ICD-10-CM

## 2018-12-31 LAB — RENAL FUNCTION PANEL
Albumin: 3.2 g/dL — ABNORMAL LOW (ref 3.5–5.0)
Anion gap: 15 (ref 5–15)
BUN: 43 mg/dL — ABNORMAL HIGH (ref 8–23)
CO2: 24 mmol/L (ref 22–32)
Calcium: 8.8 mg/dL — ABNORMAL LOW (ref 8.9–10.3)
Chloride: 98 mmol/L (ref 98–111)
Creatinine, Ser: 7.81 mg/dL — ABNORMAL HIGH (ref 0.61–1.24)
GFR calc Af Amer: 8 mL/min — ABNORMAL LOW (ref >60)
GFR calc non Af Amer: 7 mL/min — ABNORMAL LOW (ref >60)
Glucose, Bld: 127 mg/dL — ABNORMAL HIGH (ref 70–99)
Phosphorus: 3.6 mg/dL (ref 2.5–4.6)
Potassium: 4.4 mmol/L (ref 3.5–5.1)
Sodium: 137 mmol/L (ref 135–145)

## 2018-12-31 LAB — CBC
HCT: 34.6 % — ABNORMAL LOW (ref 39.0–52.0)
Hemoglobin: 10.9 g/dL — ABNORMAL LOW (ref 13.0–17.0)
MCH: 29.1 pg (ref 26.0–34.0)
MCHC: 31.5 g/dL (ref 30.0–36.0)
MCV: 92.3 fL (ref 80.0–100.0)
Platelets: 46 10*3/uL — ABNORMAL LOW (ref 150–400)
RBC: 3.75 MIL/uL — ABNORMAL LOW (ref 4.22–5.81)
RDW: 19.9 % — ABNORMAL HIGH (ref 11.5–15.5)
WBC: 5.3 10*3/uL (ref 4.0–10.5)
nRBC: 0 % (ref 0.0–0.2)

## 2018-12-31 SURGERY — ABDOMINAL AORTOGRAM W/LOWER EXTREMITY
Anesthesia: LOCAL

## 2018-12-31 MED ORDER — DIAZEPAM 5 MG PO TABS
10.0000 mg | ORAL_TABLET | Freq: Once | ORAL | Status: AC
  Administered 2018-12-31: 10 mg via ORAL
  Filled 2018-12-31: qty 2

## 2018-12-31 MED ORDER — HEPARIN SODIUM (PORCINE) 1000 UNIT/ML IJ SOLN
INTRAMUSCULAR | Status: AC
  Filled 2018-12-31: qty 4

## 2018-12-31 NOTE — Progress Notes (Addendum)
[facility] Progress Note   Subjective: Seen on HD. Still has chest pain 1/10. Going for arteriogram later today.     Objective Vitals:   12/31/18 0830 12/31/18 0900 12/31/18 0930 12/31/18 1000  BP:  (!) 146/79 126/78 125/62  Pulse: 82 78 74 82  Resp: 12 11 13 13   Temp:      TempSrc:      SpO2:      Weight:      Height:       Physical Exam General: Pleasant older male in NAD Heart: S1.S2 RRR Lungs: CTAB at present Abdomen: Active BS Extremities:  BLE pedal edema- R foot/RLE erythematous, tender. L TMA erythematous.  Dialysis Access: [facility] drsg CDI. L AVF + bruit.   Additional Objective Labs: Basic Metabolic Panel: Recent Labs  Lab 12/27/18 0442 12/28/18 1022 12/29/18 0807 12/31/18 0749  NA 137 138 139 137  K 4.5 4.1 4.6 4.4  CL 101 101 100 98  CO2 24 22 21* 24  GLUCOSE 94 127* 89 127*  BUN 20 32* 42* 43*  CREATININE 5.82* 7.72* 8.81* 7.81*  CALCIUM 8.5* 8.5* 9.0 8.8*  PHOS 5.1*  --  6.4* 3.6   Liver Function Tests: Recent Labs  Lab 12/25/18 0306 12/26/18 0707 12/27/18 0442 12/29/18 0807 12/31/18 0749  AST 107* 60* 49*  --   --   ALT 228* 164* 133*  --   --   ALKPHOS 105 95 95  --   --   BILITOT 1.2 0.9 0.7  --   --   PROT 5.7* 5.4* 5.5*  --   --   ALBUMIN 3.3* 3.3* 3.3* 3.7 3.2*   No results for input(s): LIPASE, AMYLASE in the last 168 hours. CBC: Recent Labs  Lab 12/25/18 0306 12/26/18 0707 12/27/18 0442 12/28/18 0456 12/29/18 0807 12/31/18 0515  WBC 9.0 7.2 6.0 5.8 6.7 5.3  NEUTROABS 7.2 5.3 3.9  --   --   --   HGB 12.0* 11.3* 11.3* 11.3* 12.4* 10.9*  HCT 37.9* 38.0* 37.9* 36.2* 40.0 34.6*  MCV 92.0 94.5 94.5 93.8 94.1 92.3  PLT 42* 49* 33* 32* 36* 46*   Blood Culture No results found for: SDES, SPECREQUEST, CULT, REPTSTATUS  Cardiac Enzymes: No results for input(s): CKTOTAL, CKMB, CKMBINDEX, TROPONINI in the last 168 hours. CBG: No results for input(s): GLUCAP in the last 168 hours. Iron Studies: No results for  input(s): IRON, TIBC, TRANSFERRIN, FERRITIN in the last 72 hours. @lablastinr3 @ Studies/Results: US Renal  Result Date: 12/30/2018 CLINICAL DATA:  Possible mass on recent CT examination EXAM: RENAL / URINARY TRACT ULTRASOUND COMPLETE COMPARISON:  None. FINDINGS: Right Kidney: Renal measurements: 8.2 x 3.5 x 3.7 cm = volume: 57 mL. Diffuse cortical thinning is noted. No focal mass or hydronephrosis is noted. Left Kidney: Renal measurements: 10.1 x 4.7 x 3.3 cm = volume: 84 mL. Cortical thinning is noted. Triangular shaped calcification is noted in the lower pole measuring 1.3 cm in greatest dimension similar to that seen on prior CT examination. Cystic lesions are noted which correspond to the hyperdense lesion seen on prior CT examination. Peripheral calcifications are noted similar to that seen on prior CT examination. Bladder: Decompressed IMPRESSION: Atrophic kidneys bilaterally similar to that seen on prior CT. Lower pole nonobstructing stone on the left. Cystic lesions in the left kidney which correspond to the hyperdense lesions on prior CT. Electronically Signed   By: Inez Catalina M.D.   On: 12/30/2018 02:11   [place] Lt  [place] Soft Tissue Non Vascular  Result Date: 12/30/2018 CLINICAL DATA:  Possible left calf mass EXAM: ULTRASOUND LEFT LOWER EXTREMITY LIMITED TECHNIQUE: Ultrasound examination of the lower extremity soft tissues was performed in the area of clinical concern. COMPARISON:  12/27/2018 FINDINGS: Scanning in the area of clinical concern reveals a complex predominantly solid mass lesion measuring approximately 11.5 x 6.0 cm. This corresponds to that seen on recent CT examination. No significant vascularity is noted within. IMPRESSION: Soft tissue mass lesion as described. No significant vascularity is noted within. This may represent a posttraumatic hematoma. Correlation with the clinical history is recommended. MRI would be helpful for further characterization. Electronically Signed    By: Inez Catalina M.D.   On: 12/30/2018 02:06   Medications: . sodium chloride     . albuterol  2.5 mg Nebulization TID  . amiodarone  200 mg Oral Daily  . atorvastatin  40 mg Oral QHS  . Chlorhexidine Gluconate Cloth  6 each Topical Q0600  . gabapentin  300 mg Oral QHS  . isosorbide mononitrate  30 mg Oral Daily  . metoprolol succinate  50 mg Oral Daily  . multivitamin  1 tablet Oral Daily  . pantoprazole  40 mg Oral QAC breakfast  . predniSONE  60 mg Oral Q breakfast  . sodium chloride flush  3 mL Intravenous Q12H  . umeclidinium-vilanterol  1 puff Inhalation Daily   HD Orders: [place] T,Th,S 4 hrs 180NRe 400/800 92.5 kg 3.0 K/2.25 Ca UFP 2 [facility] TDC -No heparin -Calcitriol 0.5 mcg PO TIW  Assessment/Plan: 1. R foot erythema/ possible ischemia - hx L TMA.  VVS planning arteriogram 4/16   2. ESRD- TTS HD. HD today. K+4.4. Using 3.0 K bath 3. Chest pain - seen by Dr Debara Pickett. Concerned about the cardiac catheterization in setting of thrombocytopenia.  Had recent Myoview with fixed defect.  History of CABG 2017.  No plans to proceed with cath and PCI at this time due to the thrombocytopenia and risk of bleeding.  Continues on Imdur 30 mg daily 4. Congestive heart failure with severely reduced systolic function 20 to 56% EF. 5. Thrombocytopenia- appears to be chronic and has been present since January he had a heparin-induced antibody that was negative in 12/11/2018.  Appreciate assistance from 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  He can be followed up as an outpatient. 6. BP/volume  - BP's up some, good, 120/80 on metoprolol xl 50 and imdur 30 mg. Has lost some body wt. euvolemic on exam. Refused to allow full UFG to be removed in HD today. Attempting 1 liter.  7. Anemia ckd:  stable does not appear to be an issue at this time no ESA's as an outpatient 8. Metabolic bone disease phosphorus appears to be in good control will continue to follow 9. Hyperlipidemia continues on  atorvastatin 10. GERD continues on Protonix 11. Atrial fibrillation rate controlled no anticoagulation secondary thrombocytopenia TSH 2.734   Rita H. Brown NP-C 12/31/2018, 10:24 AM  [facility] [phone]  Pt seen, examined and agree w A/P as above.  [facility] 12/31/2018, 1:35 PM

## 2018-12-31 NOTE — Progress Notes (Signed)
IP PROGRESS NOTE  Subjective:   Larry Klein has no new complaint.  He continues to have discomfort in the lower legs.  He has exertional dyspnea.  Objective: Vital signs in last 24 hours: Blood pressure 106/88, pulse 97, temperature 97.9 F (36.6 C), temperature source Oral, resp. rate 20, height 5\' 11"  (1.803 m), weight 199 lb 4.7 oz (90.4 kg), SpO2 98 %.  Intake/Output from previous day: 04/15 0701 - 04/16 0700 In: 873 [P.O.:870; I.V.:3] Out: -   Physical Exam:  Extremities: There is masslike fullness in the left lower leg, erythema at the right and left foot     Lab Results: Recent Labs    12/29/18 0807 12/31/18 0515  WBC 6.7 5.3  HGB 12.4* 10.9*  HCT 40.0 34.6*  PLT 36* 46*    BMET Recent Labs    12/29/18 0807 12/31/18 0749  NA 139 137  K 4.6 4.4  CL 100 98  CO2 21* 24  GLUCOSE 89 127*  BUN 42* 43*  CREATININE 8.81* 7.81*  CALCIUM 9.0 8.8*    No results found for: CEA1  Studies/Results: US Renal  Result Date: 12/30/2018 CLINICAL DATA:  Possible mass on recent CT examination EXAM: RENAL / URINARY TRACT ULTRASOUND COMPLETE COMPARISON:  None. FINDINGS: Right Kidney: Renal measurements: 8.2 x 3.5 x 3.7 cm = volume: 57 mL. Diffuse cortical thinning is noted. No focal mass or hydronephrosis is noted. Left Kidney: Renal measurements: 10.1 x 4.7 x 3.3 cm = volume: 84 mL. Cortical thinning is noted. Triangular shaped calcification is noted in the lower pole measuring 1.3 cm in greatest dimension similar to that seen on prior CT examination. Cystic lesions are noted which correspond to the hyperdense lesion seen on prior CT examination. Peripheral calcifications are noted similar to that seen on prior CT examination. Bladder: Decompressed IMPRESSION: Atrophic kidneys bilaterally similar to that seen on prior CT. Lower pole nonobstructing stone on the left. Cystic lesions in the left kidney which correspond to the hyperdense lesions on prior CT. Electronically Signed    By: Inez Catalina M.D.   On: 12/30/2018 02:11   [place] Lt Lower Extrem Ltd Soft Tissue Non Vascular  Result Date: 12/30/2018 CLINICAL DATA:  Possible left calf mass EXAM: ULTRASOUND LEFT LOWER EXTREMITY LIMITED TECHNIQUE: Ultrasound examination of the lower extremity soft tissues was performed in the area of clinical concern. COMPARISON:  12/27/2018 FINDINGS: Scanning in the area of clinical concern reveals a complex predominantly solid mass lesion measuring approximately 11.5 x 6.0 cm. This corresponds to that seen on recent CT examination. No significant vascularity is noted within. IMPRESSION: Soft tissue mass lesion as described. No significant vascularity is noted within. This may represent a posttraumatic hematoma. Correlation with the clinical history is recommended. MRI would be helpful for further characterization. Electronically Signed   By: Inez Catalina M.D.   On: 12/30/2018 02:06    Medications: I have reviewed the patient's current medications.  Assessment/Plan:  1.  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  Renal failure-on hemodialysis 3.  Coronary artery disease 4.  COPD 5.  Peripheral vascular disease 6.  Anemia 7.  CHF 8.  Left calf mass on CTA 12/28/2018 9.  Elevated liver enzymes   The platelet count is slightly higher today.  He has been maintained on prednisone for only 2 doses.  I discussed the case with Dr. Carlis Abbott.  The planned angiogram procedure may be complicated secondary to the aortobiiliac graft.  There  may be less bleeding risk if the platelet count is higher.  Dr. Carlis Abbott plans to delay the vascular procedure to see if the thrombocytopenia response prednisone.  I will recommend a trial of pulse Decadron therapy if the thrombocytopenia is steroid responsive.  If he obtains a partial or complete remission with pulse Decadron he will be a candidate for antiplatelet or anticoagulation therapy.  I discussed the CT and  ultrasound results with radiology.  They recommend a noncontrast left lower extremity MRI to further evaluate the left calf mass.  This is felt to most likely represent a hematoma.   LOS: 7 days   Betsy Coder, MD   12/31/2018, 2:26 PM

## 2018-12-31 NOTE — Progress Notes (Signed)
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 fib with RVR, chronic thrombocytopenia, anemia, COPD, hypertension admitted on 12/23/2018 with c/o chest pain, dyspnea on exertion a few days initially presented to [facility] ER where CTA chest  negative for PE or any acute findings, discussed with [facility],  and transferred to [facility], was apparently also tested for low risk COVID-19. IN ER, p/w A fib, rate 100s, PVC, no ST change WBC 10.2, Hgb 12.9, platelet 83, INR 1.6, ABG 7.51 / 27 / 99 , Na 133, K 4.7, Cr 5.4, proBNP 66100, AST 355, ALT 295, CRP 41.4, Procalcitonin 0.52, Ferritin 938, Troponin 0.27. Patient was seen by cardiology, nephrology, managed for CAD/unstable angina, with heparin drip. Per cardiology felt he is not a candidate for surgical or percutaneous revascularization (previous CABG, thrombocytopenia). Cardiology added Imdur. He complains of swelling on the right lower extremity, , having difficulty for fluid removal due to hypotension post HD. Off heparin gtt,COVID neg and transferred to  [place] on 4/12, and cardio signed off.  Patient had hyperpigmented skin changes in the right lower leg negative for DVT w decreased ABI on duplex- suspecting PVD and vascular surgery was consulted 4/12. He is being scheduled for arteriogram 4/16 by vascular.   HPI/Recap of past 24 hours:  Sitting up at the edge of the bed, reports leg pain has not changed, no fever, no hypoxia  He is ambulating with steady gait, continue to c/o leg pain  Planned angiogram delayed till to next week due to thrombocytopenia  Assessment/Plan: Principal Problem:   Chest pain Active Problems:   Dyspnea   ESRD (end stage renal disease) on  dialysis [facility])   Atrial fibrillation with RVR (HCC)   Acute on chronic systolic heart failure (HCC)   Cardiomyopathy ([place])   CAD with unstable angina, EKG without new ischemic changes, troponin elevation is flat and mild and recent nuclear stress test only showed a small area of apical ischemia.  Seen by cardiology off heparin drip, cardiology feels is not a candidate for surgical or percutaneous revascularization advised to continue on medical therapy and signed of, Cont on Lipitor, metoprolol, Imdur and can increase Imdur to 60 mg if has ongoing chest pain  And if post HD BP allows.Not able to use aspirin due to his low platelet count.  Follow-up with cardiology as outpatient.  Acute on Chronic systolic CHF, w dyspnea on exertion, leg swelling: fluid is being managed in dialysis.  Continue Toprol 50. Not on acre/arb , he has renal dysfucntion/hypotension. monitor blood pressure.   Mild transaminitis suspect due to CHF.  Acute hepatitis panel Neg.RUQ [place] s/p ccy, no acute findings,  ESRD on HD TTSS, last HD 4/11- hypotension limiting his ultrafiltration.  Also on Imdur and metoprolol dose may need to be adjusted to prevent hypotension.  For HD today, appreciate nephrology inputs. Anemia of chronic renal disease.  Hemoglobin is stable.  no ESA as outpatient. Hypotension, postdialysis.  On Imdur and metoprolol. BP is stable currently. Watch in HD.  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 No DVT in duplex. Vascular surgery consulted and s/p CTA 4/1, not  a candidate for bypass.  Vascular planning for arteriogram on Thursday with platelet transfusion peri-Procedure. start trial of neurontin  Persistent Atrial fibrillation:Rate controlled on amiodarone, metoprolol. Not on anticoagulation due to thrombocytopenia. Monitor w/ NSVT- monitor k and mag.  COPD:Stable.  Continue his nebulizer and anoro  Ellipta.  COVID 19 is negative. No pneumonia or fever.resp status stable.  Chronic thrombocytopenia suspecting chronic ITP.  Has been low since January patient reports it has been very low. I see as low as 30,000. Trend as below and slight uptrend. HITT was neg previous admission 3/27. He has been referred to outpatient hematology in [place].Currently no obvious bleeding and platelets are low but stable. Oncology started patient on steroids  Hypodense lesion in the lower pole of the left kidney incidentally noted on the CTA scan, nonspecific could be seen non specific- cT US renal.   "Atrophic kidneys bilaterally similar to that seen on prior CT.  Lower pole nonobstructing stone on the left.  Cystic lesions in the left kidney which correspond to the hyperdense lesions on prior CT."  Large mass in the left calf incidentally noted on the CTA,  Left lower extremity ultrasoundThere was no significant vascularity noted and could represent a posttraumatic hematoma.  An MRI was recommended.  We will consider additional work-up of this area as an outpatient. Defer to oncology    GERD on Protonix.    Code Status: full  Family Communication: patient   Disposition Plan: not ready to discharge   Consultants:  Nephrology for dialysis  Vascular surgery for possible ischemic leg   Oncology for thrombocytopenia, lung mass, left leg mass  Cardiology for chest pain/cad  Procedures:  Angiogram right lower extremity planned early next week, awaiting improvement of platelet number   Antibiotics:  none   Objective: BP 106/88 (BP Location: Right Arm)   Pulse 97   Temp 97.9 F (36.6 C) (Oral)   Resp 20   Ht 5\' 11"  (1.803 m)   Wt 90.4 kg   SpO2 100%   BMI 27.80 kg/m   Intake/Output Summary (Last 24 hours) at 12/31/2018 1308 Last data filed at 12/31/2018 1132 Gross per 24 hour  Intake 393 ml  Output 500 ml  Net -107 ml   Filed Weights   12/31/18 0013 12/31/18 0722  12/31/18 1132  Weight: 91.2 kg 91.4 kg 90.4 kg    Exam: Patient is examined daily including today on 12/31/2018, exams remain the same as of yesterday except that has changed    General:  NAD  Cardiovascular: RRR  Respiratory: CTABL  Abdomen: Soft/ND/NT, positive BS  Musculoskeletal: right foot/lower leg erythematous, tender, left foot h/o TMA  Many years ago  Neuro: alert, oriented   Data Reviewed: Basic Metabolic Panel: Recent Labs  Lab 12/25/18 0306 12/26/18 0707 12/27/18 0442 12/28/18 1022 12/29/18 0807 12/31/18 0749  NA 137 140 137 138 139 137  K 3.7 4.8 4.5 4.1 4.6 4.4  CL 99 99 101 101 100 98  CO2 24 26 24 22  21* 24  GLUCOSE 104* 110* 94 127* 89 127*  BUN 24* 37* 20 32* 42* 43*  CREATININE 5.38* 7.73* 5.82* 7.72* 8.81* 7.81*  CALCIUM 8.5* 8.8* 8.5* 8.5* 9.0 8.8*  MG 2.0 2.2 2.0 2.1  --   --   PHOS 4.9* 6.6* 5.1*  --  6.4* 3.6   Liver Function Tests: Recent Labs  Lab 12/25/18 0306 12/26/18 0707 12/27/18 0442 12/29/18 0807 12/31/18 0749  AST 107* 60* 49*  --   --  ALT 228* 164* 133*  --   --   ALKPHOS 105 95 95  --   --   BILITOT 1.2 0.9 0.7  --   --   PROT 5.7* 5.4* 5.5*  --   --   ALBUMIN 3.3* 3.3* 3.3* 3.7 3.2*   No results for input(s): LIPASE, AMYLASE in the last 168 hours. No results for input(s): AMMONIA in the last 168 hours. CBC: Recent Labs  Lab 12/25/18 0306 12/26/18 0707 12/27/18 0442 12/28/18 0456 12/29/18 0807 12/31/18 0515  WBC 9.0 7.2 6.0 5.8 6.7 5.3  NEUTROABS 7.2 5.3 3.9  --   --   --   HGB 12.0* 11.3* 11.3* 11.3* 12.4* 10.9*  HCT 37.9* 38.0* 37.9* 36.2* 40.0 34.6*  MCV 92.0 94.5 94.5 93.8 94.1 92.3  PLT 42* 49* 33* 32* 36* 46*   Cardiac Enzymes:   No results for input(s): CKTOTAL, CKMB, CKMBINDEX, TROPONINI in the last 168 hours. BNP (last 3 results) Recent Labs    12/07/18 2237  BNP 1,637.3*    ProBNP (last 3 results) No results for input(s): PROBNP in the last 8760 hours.  CBG: No results for input(s):  GLUCAP in the last 168 hours.  No results found for this or any previous visit (from the past 240 hour(s)).   Studies: No results found.  Scheduled Meds: . albuterol  2.5 mg Nebulization TID  . amiodarone  200 mg Oral Daily  . atorvastatin  40 mg Oral QHS  . Chlorhexidine Gluconate Cloth  6 each Topical Q0600  . gabapentin  300 mg Oral QHS  . heparin      . isosorbide mononitrate  30 mg Oral Daily  . metoprolol succinate  50 mg Oral Daily  . multivitamin  1 tablet Oral Daily  . pantoprazole  40 mg Oral QAC breakfast  . predniSONE  60 mg Oral Q breakfast  . sodium chloride flush  3 mL Intravenous Q12H  . umeclidinium-vilanterol  1 puff Inhalation Daily    Continuous Infusions: . sodium chloride       Time spent: 64mins I have personally reviewed and interpreted on  12/31/2018 daily labs, tele strips, imagings as discussed above under date review session and assessment and plans.  I reviewed all nursing notes, pharmacy notes, consultant notes,  vitals, pertinent old records  I have discussed plan of care as described above with RN , patient  on 12/31/2018   Florencia Reasons MD, PhD  [facility] Pager [phone]. If 7PM-7AM, please contact night-coverage at [email or web address], password [facility] 12/31/2018, 1:08 PM  LOS: 7 days

## 2018-12-31 NOTE — Progress Notes (Signed)
63 year old male that was scheduled for right lower extremity arteriogram today.  Discussed his case with [name] with medical oncology who has been following him for his thrombocytopenia and ITP.  Platelets 46 today.  The plan was to give the patient platelets today before his procedure but after discussing with [name] he feels like with steroids that have been started in the last couple of days he can probably get a fairly normal platelet count if he has appropriate response.  We would also be able to give anti-platelet after procedure if needed.  Given that his foot has no overt tissue loss at this time and just dependent rubor and this is chronic and then discussing with Dr. Oneida Alar we agreed to delay this until early next week when his risk of bleeding is significantly decreased once his platelet count is hopefully recovered.  He has an aorto biiliac graft which complicates things and we would need to have to come from his arm through his brachial artery or stick his right groin antegrade.  Discussed with the patient he is understanding.  [name], MD [facility] Office: [phone] Pager: [place]

## 2018-12-31 NOTE — Progress Notes (Signed)
Pt  To HD stable in bed, pt reported NPO for arteriogram today.

## 2019-01-01 ENCOUNTER — Telehealth: Payer: Medicare Other | Admitting: Cardiology

## 2019-01-01 ENCOUNTER — Inpatient Hospital Stay (HOSPITAL_COMMUNITY): Payer: Medicare Other

## 2019-01-01 LAB — CBC
HCT: 36 % — ABNORMAL LOW (ref 39.0–52.0)
Hemoglobin: 11.1 g/dL — ABNORMAL LOW (ref 13.0–17.0)
MCH: 28.6 pg (ref 26.0–34.0)
MCHC: 30.8 g/dL (ref 30.0–36.0)
MCV: 92.8 fL (ref 80.0–100.0)
Platelets: 69 10*3/uL — ABNORMAL LOW (ref 150–400)
RBC: 3.88 MIL/uL — ABNORMAL LOW (ref 4.22–5.81)
RDW: 20 % — ABNORMAL HIGH (ref 11.5–15.5)
WBC: 6.1 10*3/uL (ref 4.0–10.5)
nRBC: 0 % (ref 0.0–0.2)

## 2019-01-01 MED ORDER — PREDNISONE 20 MG PO TABS
40.0000 mg | ORAL_TABLET | Freq: Every day | ORAL | Status: DC
  Administered 2019-01-01 – 2019-01-04 (×4): 40 mg via ORAL
  Filled 2019-01-01 (×4): qty 2

## 2019-01-01 MED ORDER — DIAZEPAM 5 MG PO TABS
10.0000 mg | ORAL_TABLET | Freq: Once | ORAL | Status: AC
  Administered 2019-01-01: 10 mg via ORAL
  Filled 2019-01-01: qty 2

## 2019-01-01 MED ORDER — PREDNISONE 20 MG PO TABS: 40.0000 mg | ORAL_TABLET | Freq: Every day | ORAL | Status: DC

## 2019-01-01 NOTE — Progress Notes (Signed)
IP PROGRESS NOTE  Subjective:   Larry Klein has noted an increased appetite while on prednisone.  Stable discomfort in the lower legs and feet.  He is ambulating.  Objective: Vital signs in last 24 hours: Blood pressure 104/77, pulse (!) 55, temperature 97.6 F (36.4 C), temperature source Oral, resp. rate 18, height 5\' 11"  (1.803 m), weight 201 lb 6.4 oz (91.4 kg), SpO2 98 %.  Intake/Output from previous day: 04/16 0701 - 04/17 0700 In: 930 [P.O.:924; I.V.:6] Out: 500   Physical Exam: HEENT: No thrush Extremities: There is masslike fullness in the left lower leg, erythema at the right and left foot.  Trace pitting edema at the low leg bilaterally     Lab Results: Recent Labs    12/31/18 0515 01/01/19 0450  WBC 5.3 6.1  HGB 10.9* 11.1*  HCT 34.6* 36.0*  PLT 46* 69*    BMET Recent Labs    12/29/18 0807 12/31/18 0749  NA 139 137  K 4.6 4.4  CL 100 98  CO2 21* 24  GLUCOSE 89 127*  BUN 42* 43*  CREATININE 8.81* 7.81*  CALCIUM 9.0 8.8*    No results found for: CEA1  Studies/Results: No results found.  Medications: I have reviewed the patient's current medications.  Assessment/Plan:  1.  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  Renal failure-on hemodialysis 3.  Coronary artery disease 4.  COPD 5.  Peripheral vascular disease 6.  Anemia 7.  CHF 8.  Left calf mass on CTA 12/28/2018, ultrasound 12/29/2018- solid mass without vascularity 9.  Elevated liver enzymes   The platelet count is higher today.  The thrombocytopenia appears to be responding to prednisone.  I will taper the prednisone to 40 mg daily and check another CBC on 01/03/2019.  If the platelet count normalized on prednisone I will recommend a trial of pulse Decadron.  This will be in an attempt to get the ITP into remission.  He then may be a candidate for antiplatelet therapy.   I discussed the CT and ultrasound results with  radiology.  They recommend a noncontrast left lower extremity MRI to further evaluate the left calf mass.  This is felt to most likely represent a hematoma.  Recommendations: 1.  Continue prednisone, tapered to 40 mg daily 2.  Decision on proceeding with arteriogram and antiplatelet therapy per vascular surgery 3.  Noncontrast MRI of the left lower extremity to evaluate the left calf mass 4.  Please call Oncology over the weekend as needed, I will see him on 4/20, outpatient follow-up will be arranged at the [facility] cancer center   LOS: 8 days   Betsy Coder, MD   01/01/2019, 7:51 AM

## 2019-01-01 NOTE — Care Management Important Message (Signed)
Important Message  Patient Details  Name: Larry Klein MRN: [id number] Date of Birth: [birth year]   Medicare Important Message Given:  Yes    Breella Vanostrand [address] 01/01/2019, 3:02 PM

## 2019-01-01 NOTE — Progress Notes (Addendum)
[facility] Progress Note   Subjective: Seen in room - s/p MRI of L leg today, results pending. C/o very mild dyspnea - no CP at this time.    Objective Vitals:   01/01/19 0330 01/01/19 0454 01/01/19 0732 01/01/19 0841  BP:  104/77  137/76  Pulse:  (!) 55  (!) 107  Resp:  18  18  Temp:  97.6 F (36.4 C)  97.9 F (36.6 C)  TempSrc:  Oral  Oral  SpO2:  97% 98% 96%  Weight: 91.4 kg     Height:       Physical Exam General: Elderly man, NAD. Scattered bruising. Heart: RRR; no murmur Lungs: CTAB Abdomen: soft, distended Extremities: 1+ pitting LE edema; B stasis derm changes. ?mass to L calf Dialysis Access: TDC. L forearm AVF + thrill, but underdeveloped.  Additional Objective Labs: Basic Metabolic Panel: Recent Labs  Lab 12/27/18 0442 12/28/18 1022 12/29/18 0807 12/31/18 0749  NA 137 138 139 137  K 4.5 4.1 4.6 4.4  CL 101 101 100 98  CO2 24 22 21* 24  GLUCOSE 94 127* 89 127*  BUN 20 32* 42* 43*  CREATININE 5.82* 7.72* 8.81* 7.81*  CALCIUM 8.5* 8.5* 9.0 8.8*  PHOS 5.1*  --  6.4* 3.6   Liver Function Tests: Recent Labs  Lab 12/26/18 0707 12/27/18 0442 12/29/18 0807 12/31/18 0749  AST 60* 49*  --   --   ALT 164* 133*  --   --   ALKPHOS 95 95  --   --   BILITOT 0.9 0.7  --   --   PROT 5.4* 5.5*  --   --   ALBUMIN 3.3* 3.3* 3.7 3.2*   CBC: Recent Labs  Lab 12/26/18 0707 12/27/18 0442 12/28/18 0456 12/29/18 0807 12/31/18 0515 01/01/19 0450  WBC 7.2 6.0 5.8 6.7 5.3 6.1  NEUTROABS 5.3 3.9  --   --   --   --   HGB 11.3* 11.3* 11.3* 12.4* 10.9* 11.1*  HCT 38.0* 37.9* 36.2* 40.0 34.6* 36.0*  MCV 94.5 94.5 93.8 94.1 92.3 92.8  PLT 49* 33* 32* 36* 46* 69*   Medications: . sodium chloride     . albuterol  2.5 mg Nebulization TID  . amiodarone  200 mg Oral Daily  . atorvastatin  40 mg Oral QHS  . Chlorhexidine Gluconate Cloth  6 each Topical Q0600  . gabapentin  300 mg Oral QHS  . isosorbide mononitrate  30 mg Oral Daily  . metoprolol  succinate  50 mg Oral Daily  . multivitamin  1 tablet Oral Daily  . pantoprazole  40 mg Oral QAC breakfast  . predniSONE  40 mg Oral Q breakfast  . sodium chloride flush  3 mL Intravenous Q12H  . umeclidinium-vilanterol  1 puff Inhalation Daily    Dialysis Orders: TTS at [facility] 4 hrs 180NRe 400/800 92.5 kg 3.0 K/2.25 Ca UFP 2 LIJ TDC no heparin -Calcitriol 0.5 mcg PO TIW  Assessment/Plan: 1. R foot erythema: Hx L TMA.VVS planning arteriogram early next week. 2. ESRD: Continue HD per TTS schedule, next 4/18. 3. Chest pain- seen by Dr Debara Pickett.Concerned about the cardiac catheterization in setting of thrombocytopenia. Had recent Myoview with fixed defect. History of CABG 2017. No plans to proceed with cath and PCI at this time due to the thrombocytopenia andrisk of bleeding. Continues on Imdur 30 mg daily 4. Congestive heart failure with severely reduced systolic function 20 to 67% EF. 5. Thrombocytopenia: Chronic issue, negative HIT.  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 6. BP/volume: BP ok, does have some LE edema. 7. Anemia of CKD: Hgb 11.1 - no ESA for now. 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 Continue home meds. 9. Hyperlipidemia continues on atorvastatin 10. GERD continues on Protonix 11. Atrial fibrillation: Not on anticoagulation. TSH 2.734  12. L calf mass: Noted incidentally on imaging, MRI pending.  Veneta Penton, PA-C 01/01/2019, 2:33 PM  [facility] Pager: [phone]  Pt seen, examined and agree w A/P as above.  [facility] 01/01/2019, 3:33 PM

## 2019-01-01 NOTE — Progress Notes (Signed)
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 fib with RVR, chronic thrombocytopenia, anemia, COPD, hypertension admitted on 12/23/2018 with c/o chest pain, dyspnea on exertion a few days initially presented to [facility] ER where CTA chest  negative for PE or any acute findings, discussed with [facility],  and transferred to [facility], was apparently also tested for low risk COVID-19. IN ER, p/w A fib, rate 100s, PVC, no ST change WBC 10.2, Hgb 12.9, platelet 83, INR 1.6, ABG 7.51 / 27 / 99 , Na 133, K 4.7, Cr 5.4, proBNP 66100, AST 355, ALT 295, CRP 41.4, Procalcitonin 0.52, Ferritin 938, Troponin 0.27. Patient was seen by cardiology, nephrology, managed for CAD/unstable angina, with heparin drip. Per cardiology felt he is not a candidate for surgical or percutaneous revascularization (previous CABG, thrombocytopenia). Cardiology added Imdur. He complains of swelling on the right lower extremity, , having difficulty for fluid removal due to hypotension post HD. Off heparin gtt,COVID neg and transferred to  [place] on 4/12, and cardio signed off.  Patient had hyperpigmented skin changes in the right lower leg negative for DVT w decreased ABI on duplex- suspecting PVD and vascular surgery was consulted 4/12. He is being scheduled for arteriogram 4/16 by vascular.   HPI/Recap of past 24 hours:   plt 69 neurontin nightly started last night, he reports has not seen any different  for leg pain Sitting up at the edge of the bed,  no fever, no hypoxia, no cough  He is ambulating with steady gait, denies chest pain  Planned angiogram delayed till to next week due to thrombocytopenia  Assessment/Plan: Principal Problem:   Chest  pain Active Problems:   Dyspnea   ESRD (end stage renal disease) on dialysis [facility])   Atrial fibrillation with RVR (HCC)   Acute on chronic systolic heart failure (HCC)   Cardiomyopathy (HCC)   PVD (peripheral vascular disease) ([place])   CAD with unstable angina, EKG without new ischemic changes, troponin elevation is flat and mild and recent nuclear stress test only showed a small area of apical ischemia.  Seen by cardiology off heparin drip, cardiology feels is not a candidate for surgical or percutaneous revascularization advised to continue on medical therapy and signed of, Cont on Lipitor, metoprolol, Imdur and can increase Imdur to 60 mg if has ongoing chest pain  And if post HD BP allows.Not able to use aspirin due to his low platelet count.  Follow-up with cardiology as outpatient.  Acute on Chronic systolic CHF, w dyspnea on exertion, leg swelling: fluid is being managed in dialysis.  Continue Toprol 50. Not on acre/arb , he has renal dysfucntion/hypotension. monitor blood pressure.   Mild transaminitis suspect due to CHF.  Acute hepatitis panel Neg.RUQ [place] s/p ccy, no acute findings,  ESRD on HD TTSS, last HD 4/11- hypotension limiting his ultrafiltration.  Also on Imdur and metoprolol dose may need to be adjusted to prevent hypotension.  For HD today, appreciate nephrology inputs. Anemia of chronic renal disease.  Hemoglobin is stable.  no ESA as outpatient. Hypotension, postdialysis.  On Imdur and metoprolol. BP is stable currently. Watch in HD.  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 No DVT in duplex. Vascular surgery consulted and s/p CTA 4/1, not a candidate for bypass.  Vascular planning for arteriogram on Thursday with platelet transfusion peri-Procedure. start trial of neurontin  Persistent Atrial fibrillation:Rate controlled on amiodarone, metoprolol. Not on anticoagulation  due to thrombocytopenia. Monitor w/ NSVT- monitor k and mag.  COPD:Stable.  Continue his nebulizer and anoro Ellipta.  COVID 19 is negative. No pneumonia or fever.resp status stable.  Chronic thrombocytopenia suspecting chronic ITP.  Has been low since January patient reports it has been very low. I see as low as 30,000. Trend as below and slight uptrend. HITT was neg previous admission 3/27. He has been referred to outpatient hematology in [place].Currently no obvious bleeding and platelets are low but stable. Oncology started patient on steroids  Hypodense lesion in the lower pole of the left kidney incidentally noted on the CTA scan, nonspecific could be seen non specific- cT US renal.   "Atrophic kidneys bilaterally similar to that seen on prior CT.  Lower pole nonobstructing stone on the left.  Cystic lesions in the left kidney which correspond to the hyperdense lesions on prior CT."  Large mass in the left calf incidentally noted on the CTA,  Left lower extremity ultrasoundThere was no significant vascularity noted and could represent a posttraumatic hematoma.  An MRI was recommended.  We will consider additional work-up of this area as an outpatient. Defer to oncology    GERD on Protonix.    Code Status: full  Family Communication: patient   Disposition Plan: not ready to discharge   Consultants:  Nephrology for dialysis  Vascular surgery for possible ischemic leg   Oncology for thrombocytopenia, lung mass, left leg mass  Cardiology for chest pain/cad  Procedures:  Angiogram right lower extremity planned early next week, awaiting improvement of platelet number   Antibiotics:  none   Objective: BP 104/77 (BP Location: Right Arm)   Pulse (!) 55   Temp 97.6 F (36.4 C) (Oral)   Resp 18   Ht 5\' 11"  (1.803 m)   Wt 91.4 kg   SpO2 98%   BMI 28.09 kg/m   Intake/Output Summary (Last 24 hours) at 01/01/2019 0752 Last data filed at 01/01/2019 0500  Gross per 24 hour  Intake 930 ml  Output 500 ml  Net 430 ml   Filed Weights   12/31/18 0722 12/31/18 1132 01/01/19 0330  Weight: 91.4 kg 90.4 kg 91.4 kg    Exam: Patient is examined daily including today on 01/01/2019, exams remain the same as of yesterday except that has changed    General:  NAD  Cardiovascular: RRR  Respiratory: CTABL  Abdomen: Soft/ND/NT, positive BS  Musculoskeletal: right foot/lower leg erythematous, tender, left foot h/o TMA  Many years ago  Neuro: alert, oriented   Data Reviewed: Basic Metabolic Panel: Recent Labs  Lab 12/26/18 0707 12/27/18 0442 12/28/18 1022 12/29/18 0807 12/31/18 0749  NA 140 137 138 139 137  K 4.8 4.5 4.1 4.6 4.4  CL 99 101 101 100 98  CO2 26 24 22  21* 24  GLUCOSE 110* 94 127* 89 127*  BUN 37* 20 32* 42* 43*  CREATININE 7.73* 5.82* 7.72* 8.81* 7.81*  CALCIUM 8.8* 8.5* 8.5* 9.0 8.8*  MG 2.2 2.0 2.1  --   --   PHOS 6.6* 5.1*  --  6.4* 3.6   Liver Function Tests: Recent Labs  Lab 12/26/18 0707 12/27/18 0442 12/29/18 0807 12/31/18 0749  AST 60* 49*  --   --   ALT 164* 133*  --   --   ALKPHOS 95 95  --   --   BILITOT 0.9 0.7  --   --   PROT 5.4* 5.5*  --   --   ALBUMIN 3.3* 3.3* 3.7 3.2*   No results for input(s): LIPASE, AMYLASE in the last 168 hours. No results for input(s): AMMONIA in the last 168 hours. CBC: Recent Labs  Lab 12/26/18 0707 12/27/18 0442 12/28/18 0456 12/29/18 0807 12/31/18 0515 01/01/19 0450  WBC 7.2 6.0 5.8 6.7 5.3 6.1  NEUTROABS 5.3 3.9  --   --   --   --   HGB 11.3* 11.3* 11.3* 12.4* 10.9* 11.1*  HCT 38.0* 37.9* 36.2* 40.0 34.6* 36.0*  MCV 94.5 94.5 93.8 94.1 92.3 92.8  PLT 49* 33* 32* 36* 46* 69*   Cardiac Enzymes:   No results for input(s): CKTOTAL, CKMB, CKMBINDEX, TROPONINI in the last 168 hours. BNP (last 3 results) Recent Labs    12/07/18 2237  BNP 1,637.3*    ProBNP (last 3 results) No results for input(s): PROBNP in the last 8760 hours.  CBG: No results for  input(s): GLUCAP in the last 168 hours.  No results found for this or any previous visit (from the past 240 hour(s)).   Studies: No results found.  Scheduled Meds: . albuterol  2.5 mg Nebulization TID  . amiodarone  200 mg Oral Daily  . atorvastatin  40 mg Oral QHS  . Chlorhexidine Gluconate Cloth  6 each Topical Q0600  . gabapentin  300 mg Oral QHS  . isosorbide mononitrate  30 mg Oral Daily  . metoprolol succinate  50 mg Oral Daily  . multivitamin  1 tablet Oral Daily  . pantoprazole  40 mg Oral QAC breakfast  . predniSONE  60 mg Oral Q breakfast  . sodium chloride flush  3 mL Intravenous Q12H  . umeclidinium-vilanterol  1 puff Inhalation Daily    Continuous Infusions: . sodium chloride       Time spent: 35mins I have personally reviewed and interpreted on  01/01/2019 daily labs, tele strips, imagings as discussed above under date review session and assessment and plans.  I reviewed all nursing notes, pharmacy notes, consultant notes,  vitals, pertinent old records  I have discussed plan of care as described above with RN , patient  on 01/01/2019   Florencia Reasons MD, PhD  [facility] Pager [phone]. If 7PM-7AM, please contact night-coverage at [email or web address], password [facility] 01/01/2019, 7:52 AM  LOS: 8 days

## 2019-01-02 LAB — RENAL FUNCTION PANEL
Albumin: 3.3 g/dL — ABNORMAL LOW (ref 3.5–5.0)
Anion gap: 14 (ref 5–15)
BUN: 52 mg/dL — ABNORMAL HIGH (ref 8–23)
CO2: 25 mmol/L (ref 22–32)
Calcium: 8.6 mg/dL — ABNORMAL LOW (ref 8.9–10.3)
Chloride: 97 mmol/L — ABNORMAL LOW (ref 98–111)
Creatinine, Ser: 6.84 mg/dL — ABNORMAL HIGH (ref 0.61–1.24)
GFR calc Af Amer: 9 mL/min — ABNORMAL LOW (ref >60)
GFR calc non Af Amer: 8 mL/min — ABNORMAL LOW (ref >60)
Glucose, Bld: 143 mg/dL — ABNORMAL HIGH (ref 70–99)
Phosphorus: 3.6 mg/dL (ref 2.5–4.6)
Potassium: 4.5 mmol/L (ref 3.5–5.1)
Sodium: 136 mmol/L (ref 135–145)

## 2019-01-02 LAB — CBC
HCT: 38.6 % — ABNORMAL LOW (ref 39.0–52.0)
Hemoglobin: 11.6 g/dL — ABNORMAL LOW (ref 13.0–17.0)
MCH: 27.7 pg (ref 26.0–34.0)
MCHC: 30.1 g/dL (ref 30.0–36.0)
MCV: 92.1 fL (ref 80.0–100.0)
Platelets: 101 10*3/uL — ABNORMAL LOW (ref 150–400)
RBC: 4.19 MIL/uL — ABNORMAL LOW (ref 4.22–5.81)
RDW: 19.7 % — ABNORMAL HIGH (ref 11.5–15.5)
WBC: 9.9 10*3/uL (ref 4.0–10.5)
nRBC: 0 % (ref 0.0–0.2)

## 2019-01-02 MED ORDER — DIAZEPAM 5 MG PO TABS
10.0000 mg | ORAL_TABLET | Freq: Once | ORAL | Status: AC
  Administered 2019-01-02: 10 mg via ORAL
  Filled 2019-01-02: qty 2

## 2019-01-02 NOTE — Progress Notes (Signed)
[facility] Progress Note   Subjective: Seen in room, no new/ c/o.  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   Objective Vitals:   01/01/19 2048 01/02/19 0150 01/02/19 0530 01/02/19 0917  BP:   117/85   Pulse: 82  82   Resp: 16  20   Temp:   97.6 F (36.4 C)   TempSrc:      SpO2: 99%  98% 98%  Weight:  92.5 kg    Height:       Physical Exam General: Elderly man, NAD. Scattered bruising. Heart: RRR; no murmur Lungs: CTAB Abdomen: soft, distended Extremities: 1+ pitting LE edema; B stasis derm changes. ?mass to L calf Dialysis Access: TDC. L forearm AVF + thrill, but underdeveloped.  Additional Objective Labs: Basic Metabolic Panel: Recent Labs  Lab 12/29/18 0807 12/31/18 0749 01/02/19 1154  NA 139 137 136  K 4.6 4.4 4.5  CL 100 98 97*  CO2 21* 24 25  GLUCOSE 89 127* 143*  BUN 42* 43* 52*  CREATININE 8.81* 7.81* 6.84*  CALCIUM 9.0 8.8* 8.6*  PHOS 6.4* 3.6 3.6   Liver Function Tests: Recent Labs  Lab 12/27/18 0442 12/29/18 0807 12/31/18 0749 01/02/19 1154  AST 49*  --   --   --   ALT 133*  --   --   --   ALKPHOS 95  --   --   --   BILITOT 0.7  --   --   --   PROT 5.5*  --   --   --   ALBUMIN 3.3* 3.7 3.2* 3.3*   CBC: Recent Labs  Lab 12/27/18 0442 12/28/18 0456 12/29/18 0807 12/31/18 0515 01/01/19 0450 01/02/19 1056  WBC 6.0 5.8 6.7 5.3 6.1 9.9  NEUTROABS 3.9  --   --   --   --   --   HGB 11.3* 11.3* 12.4* 10.9* 11.1* 11.6*  HCT 37.9* 36.2* 40.0 34.6* 36.0* 38.6*  MCV 94.5 93.8 94.1 92.3 92.8 92.1  PLT 33* 32* 36* 46* 69* 101*   Medications: . sodium chloride     . albuterol  2.5 mg Nebulization TID  . amiodarone  200 mg Oral Daily  . atorvastatin  40 mg Oral QHS  . Chlorhexidine Gluconate Cloth  6 each Topical Q0600  . gabapentin  300 mg Oral QHS  . isosorbide mononitrate  30 mg Oral Daily  . metoprolol succinate  50 mg Oral Daily  . multivitamin  1 tablet Oral Daily  . pantoprazole  40 mg Oral QAC  breakfast  . predniSONE  40 mg Oral Q breakfast  . sodium chloride flush  3 mL Intravenous Q12H  . umeclidinium-vilanterol  1 puff Inhalation Daily    Dialysis Orders: TTS at [place] 4 hrs 180NRe 400/800 92.5 kg 3.0 K/2.25 Ca UFP 2 LIJ TDC no heparin -Calcitriol 0.5 mcg PO TIW  Assessment/Plan: 1. R foot erythema: Hx L TMA.VVS planning arteriogram next week if platelets improve.   2. ESRD: Continue HD per TTS schedule. HD today.  3. Chest pain- seen by Dr Debara Pickett.Concerned about the cardiac catheterization in setting of thrombocytopenia. Had recent Myoview with fixed defect. History of CABG 2017. No plans to proceed with cath and PCI at this time due to the thrombocytopenia andrisk of bleeding. Continues on Imdur 30 mg daily 4. Congestive heart failure with severely reduced systolic function 83% LVEF 5. Thrombocytopenia: Chronic issue, negative HIT. 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 Platelets rising  daily.  6. BP/volume: BP ok, does have some LE edema. At dry wt.  7. Anemia of CKD: Hgb 11.1 - no ESA for now. 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 Continue home meds. 9. Hyperlipidemia continues on atorvastatin 10. GERD continues on Protonix 11. Atrial fibrillation: Not on anticoagulation. TSH 2.734  12. L calf mass: MRI done, possible malignant.  Per ONC.   [facility] 01/02/2019, 1:29 PM

## 2019-01-02 NOTE — Progress Notes (Signed)
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 fib with RVR, chronic thrombocytopenia, anemia, COPD, hypertension admitted on 12/23/2018 with c/o chest pain, dyspnea on exertion a few days initially presented to [facility] ER where CTA chest  negative for PE or any acute findings, discussed with [facility]iology,  and transferred to [facility], was apparently also tested for low risk COVID-19. IN ER, p/w A fib, rate 100s, PVC, no ST change WBC 10.2, Hgb 12.9, platelet 83, INR 1.6, ABG 7.51 / 27 / 99 , Na 133, K 4.7, Cr 5.4, proBNP 66100, AST 355, ALT 295, CRP 41.4, Procalcitonin 0.52, Ferritin 938, Troponin 0.27. Patient was seen by cardiology, nephrology, managed for CAD/unstable angina, with heparin drip. Per cardiology felt he is not a candidate for surgical or percutaneous revascularization (previous CABG, thrombocytopenia). Cardiology added Imdur. He complains of swelling on the right lower extremity, , having difficulty for fluid removal due to hypotension post HD. Off heparin gtt,COVID neg and transferred to  [place] on 4/12, and cardio signed off.  Patient had hyperpigmented skin changes in the right lower leg negative for DVT w decreased ABI on duplex- suspecting PVD and vascular surgery was consulted 4/12. He is being scheduled for arteriogram 4/16 by vascular.   HPI/Recap of past 24 hours:   plt continue to improve today is at 101 neurontin nightly started two  Days ago, he reports has not seen any different  for leg pain He is seen in dialysis unit  no fever, no hypoxia, no cough   Planned angiogram delayed till to next week due to thrombocytopenia  Assessment/Plan: Principal Problem:   Chest pain Active Problems:  Dyspnea   ESRD (end stage renal disease) on dialysis [facility])   Atrial fibrillation with RVR (HCC)   Acute on chronic systolic heart failure (HCC)   Cardiomyopathy (HCC)   PVD (peripheral vascular disease) ([place])   CAD with unstable angina, EKG without new ischemic changes, troponin elevation is flat and mild and recent nuclear stress test only showed a small area of apical ischemia.  Seen by cardiology off heparin drip, cardiology feels is not a candidate for surgical or percutaneous revascularization advised to continue on medical therapy and signed of, Cont on Lipitor, metoprolol, Imdur and can increase Imdur to 60 mg if has ongoing chest pain  And if post HD BP allows.Not able to use aspirin due to his low platelet count.  Follow-up with cardiology as outpatient.  Acute on Chronic systolic CHF, w dyspnea on exertion, leg swelling: fluid is being managed in dialysis.  Continue Toprol 50. Not on acre/arb , he has renal dysfucntion/hypotension. monitor blood pressure.   Mild transaminitis suspect due to CHF.  Acute hepatitis panel Neg.RUQ [place] s/p ccy, no acute findings,  ESRD on HD TTSS, last HD 4/11- hypotension limiting his ultrafiltration.  Also on Imdur and metoprolol dose may need to be adjusted to prevent hypotension.  For HD today, appreciate nephrology inputs. Anemia of chronic renal disease.  Hemoglobin is stable.  no ESA as outpatient. Hypotension, postdialysis.  On Imdur and metoprolol. BP is stable currently. Watch in HD.  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 No DVT in duplex. Vascular surgery consulted and s/p CTA 4/1, not a candidate for bypass.  Vascular planning for arteriogram on Thursday with platelet transfusion peri-Procedure. start trial of neurontin  Persistent Atrial fibrillation:Rate controlled on amiodarone, metoprolol. Not on anticoagulation due to thrombocytopenia.  Monitor w/ NSVT- monitor k and mag.  COPD:Stable.  Continue his nebulizer and anoro Ellipta.  COVID 19 is negative. No pneumonia or fever.resp status stable.  Chronic thrombocytopenia suspecting chronic ITP.  Has been low since January patient reports it has been very low. I see as low as 30,000. Trend as below and slight uptrend. HITT was neg previous admission 3/27. He has been referred to outpatient hematology in [place].Currently no obvious bleeding and platelets are low but stable. Oncology started patient on steroids  Hypodense lesion in the lower pole of the left kidney incidentally noted on the CTA scan, nonspecific could be seen non specific- cT US renal.   "Atrophic kidneys bilaterally similar to that seen on prior CT.  Lower pole nonobstructing stone on the left.  Cystic lesions in the left kidney which correspond to the hyperdense lesions on prior CT."  Large mass in the left calf incidentally noted on the CTA,  Left lower extremity ultrasoundThere was no significant vascularity noted and could represent a posttraumatic hematoma.  An MRI was recommended.  We will consider additional work-up of this area as an outpatient. Defer to oncology    GERD on Protonix.    Code Status: full  Family Communication: patient   Disposition Plan: not ready to discharge   Consultants:  Nephrology for dialysis  Vascular surgery for possible ischemic leg   Oncology for thrombocytopenia, lung mass, left leg mass  Cardiology for chest pain/cad  Procedures:  Angiogram right lower extremity planned early next week, awaiting improvement of platelet number   Antibiotics:  none   Objective: BP (!) 99/56   Pulse 61   Temp 97.6 F (36.4 C) (Oral)   Resp 20   Ht 5\' 11"  (1.803 m)   Wt 93.3 kg Comment: stood to scale   SpO2 98%   BMI 28.69 kg/m   Intake/Output Summary (Last 24 hours) at 01/02/2019 1536 Last data filed at 01/02/2019 0745 Gross per 24 hour  Intake  720 ml  Output -  Net 720 ml   Filed Weights   01/01/19 0330 01/02/19 0150 01/02/19 1138  Weight: 91.4 kg 92.5 kg 93.3 kg    Exam: Patient is examined daily including today on 01/02/2019, exams remain the same as of yesterday except that has changed    General:  NAD  Cardiovascular: RRR  Respiratory: CTABL  Abdomen: Soft/ND/NT, positive BS  Musculoskeletal: right foot/lower leg erythematous, tender, left foot h/o TMA  Many years ago  Neuro: alert, oriented   Data Reviewed: Basic Metabolic Panel: Recent Labs  Lab 12/27/18 0442 12/28/18 1022 12/29/18 0807 12/31/18 0749 01/02/19 1154  NA 137 138 139 137 136  K 4.5 4.1 4.6 4.4 4.5  CL 101 101 100 98 97*  CO2 24 22 21* 24 25  GLUCOSE 94 127* 89 127* 143*  BUN 20 32* 42* 43* 52*  CREATININE 5.82* 7.72* 8.81* 7.81* 6.84*  CALCIUM 8.5* 8.5* 9.0 8.8* 8.6*  MG 2.0 2.1  --   --   --   PHOS 5.1*  --  6.4* 3.6 3.6   Liver Function Tests: Recent Labs  Lab 12/27/18 0442 12/29/18 0807 12/31/18 0749 01/02/19 1154  AST 49*  --   --   --  ALT 133*  --   --   --   ALKPHOS 95  --   --   --   BILITOT 0.7  --   --   --   PROT 5.5*  --   --   --   ALBUMIN 3.3* 3.7 3.2* 3.3*   No results for input(s): LIPASE, AMYLASE in the last 168 hours. No results for input(s): AMMONIA in the last 168 hours. CBC: Recent Labs  Lab 12/27/18 0442 12/28/18 0456 12/29/18 0807 12/31/18 0515 01/01/19 0450 01/02/19 1056  WBC 6.0 5.8 6.7 5.3 6.1 9.9  NEUTROABS 3.9  --   --   --   --   --   HGB 11.3* 11.3* 12.4* 10.9* 11.1* 11.6*  HCT 37.9* 36.2* 40.0 34.6* 36.0* 38.6*  MCV 94.5 93.8 94.1 92.3 92.8 92.1  PLT 33* 32* 36* 46* 69* 101*   Cardiac Enzymes:   No results for input(s): CKTOTAL, CKMB, CKMBINDEX, TROPONINI in the last 168 hours. BNP (last 3 results) Recent Labs    12/07/18 2237  BNP 1,637.3*    ProBNP (last 3 results) No results for input(s): PROBNP in the last 8760 hours.  CBG: No results for input(s): GLUCAP in the  last 168 hours.  No results found for this or any previous visit (from the past 240 hour(s)).   Studies: No results found.  Scheduled Meds: . albuterol  2.5 mg Nebulization TID  . amiodarone  200 mg Oral Daily  . atorvastatin  40 mg Oral QHS  . Chlorhexidine Gluconate Cloth  6 each Topical Q0600  . gabapentin  300 mg Oral QHS  . isosorbide mononitrate  30 mg Oral Daily  . metoprolol succinate  50 mg Oral Daily  . multivitamin  1 tablet Oral Daily  . pantoprazole  40 mg Oral QAC breakfast  . predniSONE  40 mg Oral Q breakfast  . sodium chloride flush  3 mL Intravenous Q12H  . umeclidinium-vilanterol  1 puff Inhalation Daily    Continuous Infusions: . sodium chloride       Time spent: 30mins I have personally reviewed and interpreted on  01/02/2019 daily labs, imagings as discussed above under date review session and assessment and plans.  I reviewed all nursing notes, pharmacy notes, consultant notes,  vitals, pertinent old records  I have discussed plan of care as described above with RN , patient  on 01/02/2019   Florencia Reasons MD, PhD  [facility] Pager [phone]. If 7PM-7AM, please contact night-coverage at [email or web address], password [facility] 01/02/2019, 3:36 PM  LOS: 9 days

## 2019-01-02 NOTE — Progress Notes (Signed)
RN found pt sleeping in the room, lying head on bedside table.  RN encouraged pt to lie in bed for safety concerns.  Pt. Refused and NT verified conversation.

## 2019-01-03 MED ORDER — DIAZEPAM 5 MG PO TABS
10.0000 mg | ORAL_TABLET | Freq: Once | ORAL | Status: AC
  Administered 2019-01-03: 10 mg via ORAL
  Filled 2019-01-03: qty 2

## 2019-01-03 NOTE — Progress Notes (Signed)
   [facility] Progress Note   Subjective: Seen in room, no new/ c/o.    Objective Vitals:   01/02/19 2130 01/03/19 0640 01/03/19 0642 01/03/19 0830  BP: 107/76 102/66    Pulse: 69 82    Resp:  18    Temp:  98.1 F (36.7 C)    TempSrc:  Oral    SpO2:  100%  97%  Weight:   90.5 kg   Height:       Physical Exam General: Elderly man, NAD. Scattered bruising. Heart: RRR; no murmur Lungs: CTAB Abdomen: soft, distended Extremities: 1+ pitting LE edema; B stasis derm changes. ?mass to L calf Dialysis Access: TDC. L forearm AVF + thrill, but underdeveloped.  Additional Objective Labs: Basic Metabolic Panel: Recent Labs  Lab 12/29/18 0807 12/31/18 0749 01/02/19 1154  NA 139 137 136  K 4.6 4.4 4.5  CL 100 98 97*  CO2 21* 24 25  GLUCOSE 89 127* 143*  BUN 42* 43* 52*  CREATININE 8.81* 7.81* 6.84*  CALCIUM 9.0 8.8* 8.6*  PHOS 6.4* 3.6 3.6   Liver Function Tests: Recent Labs  Lab 12/29/18 0807 12/31/18 0749 01/02/19 1154  ALBUMIN 3.7 3.2* 3.3*   CBC: Recent Labs  Lab 12/28/18 0456 12/29/18 0807 12/31/18 0515 01/01/19 0450 01/02/19 1056  WBC 5.8 6.7 5.3 6.1 9.9  HGB 11.3* 12.4* 10.9* 11.1* 11.6*  HCT 36.2* 40.0 34.6* 36.0* 38.6*  MCV 93.8 94.1 92.3 92.8 92.1  PLT 32* 36* 46* 69* 101*   Medications: . sodium chloride     . albuterol  2.5 mg Nebulization TID  . amiodarone  200 mg Oral Daily  . atorvastatin  40 mg Oral QHS  . Chlorhexidine Gluconate Cloth  6 each Topical Q0600  . gabapentin  300 mg Oral QHS  . isosorbide mononitrate  30 mg Oral Daily  . metoprolol succinate  50 mg Oral Daily  . multivitamin  1 tablet Oral Daily  . pantoprazole  40 mg Oral QAC breakfast  . predniSONE  40 mg Oral Q breakfast  . sodium chloride flush  3 mL Intravenous Q12H  . umeclidinium-vilanterol  1 puff Inhalation Daily    Dialysis Orders: TTS at [place] 4h   400/800    92.5kg 3K/2.25 bath  P2   [facility] TDC   Heparin none -Calcitriol 0.5 mcg PO  TIW  Assessment/Plan: 1. R foot erythema: Hx L TMA.VVS planning arteriogram this week but only if platelets improve.  2. Thrombocytopenia: acute on chronic issue, negative HIT. Hem/onc treating as ITP, getting course of prednisone and platelets rising daily 3. L calf mass: MRI done, possible malignant/ sarcoma per Rad report 4. ESRD: Continue HD per TTS schedule. HD Tuesday 5. BP/volume: BP ok, does have some LE edema. At dry wt.  6. Chest pain/ hx CABG- seen by Dr Debara Pickett.Concerned about the cardiac catheterization in setting of thrombocytopenia. Had recent Myoview with fixed defect. No plans to proceed with cath and PCI at this time due to the thrombocytopenia andrisk of bleeding. Continues on Imdur 30 mg daily 7. Congestive heart failure: LVEF 25% 8. Anemia of CKD: Hgb 11.1 - no ESA for now. 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 Continue home meds. 10. Hyperlipidemia continues on atorvastatin 11. GERD continues on Protonix 12. Atrial fibrillation: Not on anticoagulation. TSH 2.734    [facility] 01/03/2019, 12:33 PM

## 2019-01-03 NOTE — Progress Notes (Signed)
PROGRESS NOTE   Larry Klein  NTZ:[id number]    DOB: 13-Dec-1955    DOA: 12/23/2018  PCP: Patient, No Pcp Per   I have briefly reviewed patients previous medical records in [facility]  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 fib with RVR, chronic thrombocytopenia, anemia, COPD, HTN, admitted on 12/23/2018 with complaints of chest pain and dyspnea on exertion, initially presented to [facility] ED where CTA chest was negative for PE, transferred to [facility] for cardiology evaluation.  Cardiology evaluated for unstable angina, was on heparin drip, felt not a candidate for surgical or percutaneous revascularization (prior CABG, thrombocytopenia), added Imdur and signed off.  COVID testing negative.  Nephrology consulting for HD needs.Vascular surgery consulted for right foot pain and plan arteriogram 4/21 pending further improvement in thrombocytopenia.  Hematology consulted for thrombocytopenia.   Assessment & Plan:   Principal Problem:   Chest pain Active Problems:   Dyspnea   ESRD (end stage renal disease) on dialysis [facility])   Atrial fibrillation with RVR (HCC)   Acute on chronic systolic heart failure (HCC)   Cardiomyopathy (HCC)   PVD (peripheral vascular disease) ([place])    CAD with unstable angina -EKG without new ischemic changes, troponin elevation is flat and mild and recent nuclear stress test only showed a small area of apical ischemia.  - Seen by cardiology, off heparin drip, Cardiology feels he is not a candidate for surgical or percutaneous revascularization, advised to continue on medical therapy and signed off.  - Cont on Lipitor, metoprolol, Imdur and can increase Imdur to 60 mg if has ongoing chest pain and if post HD BP allows. - Not able to use aspirin due to his low platelet count.  -  Follow-up with Cardiology as outpatient. - No chest pain reported today.  Acute on Chronic systolic CHF - wdyspnea on exertion, leg swelling on admission: fluid is being managed in dialysis.  - Continue Toprol 50 MG daily. Not on acre/arb , he has renal dysfucntion/hypotension.monitor blood pressure.  -Patient reports that he still makes urine, was on oral Lasix until a month prior to admission and is asking if he can be on Lasix because he wants to continue to make urine.  Defer Lasix decision to nephrology. -Improved.  Mild transaminitis suspect due to CHF.  - Acute hepatitis panel Neg. RUQ [place] s/p cholecystectomy, no acute findings,  ESRD on HD TTSS, - hypotension limiting his ultrafiltration. Also on Imdur and metoprolol dose may need to be adjusted to prevent hypotension. Nephrology continues to see for HD needs, next HD on 4/21.  Anemia of chronic renal disease.  - Hemoglobin is stable. no ESA as outpatient.  Hypotension,postdialysis. - On Imdur and metoprolol. BP is stable currently. Watch in HD.  Lower ext swelling,s/p traumatic Left TMA old, and Right leg swelling worse than left w erythema - skin changes, suspecting PVD as per ABI. No DVT in duplex. Vascular surgery consulted and s/pCTA 4/1,not a candidate for bypass.Vascular planning for arteriogram on 4/21 pending further improvement in thrombocytopenia.  -Started trial of neurontin.  No lower extremity pain reported today.  Persistent Atrial fibrillation: - Rate controlled on amiodarone, metoprolol. Not on anticoagulation due to thrombocytopenia. Monitor w/ NSVT-monitor k and mag.  COPD: - Stable. Continue his nebulizer and anoro Ellipta.  COVID 19 is negative.  - No  pneumonia or fever.resp status stable.  Acute onChronic thrombocytopenia - suspecting chronic ITP. Has been low since January patient reports it has been very low. Seen as low as 30,000. HITTwasneg previous admission3/27.  He has been referred to outpatient hematology in [place].Currently no obvious bleeding and platelets are low but stable. Oncology started patient on steroids -Improving.  Platelet count up to 101 on 4/18.  No CBC drawn for today.  Will check CBC in a.m.  Hypodenselesionin the lower pole of the left kidney  - incidentally noted on the CTAscan, nonspecific could be seen non specific- cT US renal.  "Atrophic kidneys bilaterally similar to that seen on prior CT.  Lower pole nonobstructing stone on the left.  Cystic lesions in the left kidney which correspond to the hyperdense lesions on prior CT." -This will need to be followed up closely as outpatient.  Largemassin the left calf - incidentally noted on the CTA,  - Left lower extremity ultrasound there was no significant vascularity noted and could represent a posttraumatic hematoma.  -MRI suggest possible malignant/sarcoma.  Will discuss with vascular surgery/orthopedics in a.m.  GERD  - on Protonix.  Hyperlipidemia Continue statins.  DVT prophylaxis: SCDs. Code Status: Full Family Communication: None at bedside Disposition: Not medically ready for discharge yet   Consultants:  Nephrology Vascular surgery Hematology/oncology Cardiology  Procedures:  HD  Antimicrobials:  None   Subjective: Patient interviewed and examined along with RN at bedside.  States that he still makes urine, was on Lasix until a month ago and is requesting if he can go back on Lasix because he wants to continue to make urine.  No dyspnea or chest pain reported.  No lower extremity pain reported.  ROS: As above, otherwise negative.  Objective:  Vitals:   01/03/19 0640 01/03/19 0642 01/03/19 0830 01/03/19 1307  BP: 102/66   108/78  Pulse: 82   (!) 59  Resp: 18   20  Temp: 98.1 F (36.7 C)   97.6 F (36.4 C)  TempSrc: Oral   Oral  SpO2: 100%  97% 97%  Weight:  90.5 kg    Height:        Examination:  General exam: Pleasant  middle-age male, moderately built and nourished sitting up comfortably in bed. Respiratory system: Clear to auscultation. Respiratory effort normal. Cardiovascular system: S1 & S2 heard, RRR. No JVD, murmurs, rubs, gallops or clicks. No pedal edema. Gastrointestinal system: Abdomen is nondistended, soft and nontender. No organomegaly or masses felt. Normal bowel sounds heard. Central nervous system: Alert and oriented. No focal neurological deficits. Extremities: Symmetric 5 x 5 power.  Left TMA healed stump.  Mild patchy redness and trace edema of both ankles and foot.  No increased warmth. Skin: No rashes, lesions or ulcers Psychiatry: Judgement and insight appear normal. Mood & affect appropriate.     Data Reviewed: I have personally reviewed following labs and imaging studies  CBC: Recent Labs  Lab 12/28/18 0456 12/29/18 0807 12/31/18 0515 01/01/19 0450 01/02/19 1056  WBC 5.8 6.7 5.3 6.1 9.9  HGB 11.3* 12.4* 10.9* 11.1* 11.6*  HCT 36.2* 40.0 34.6* 36.0* 38.6*  MCV 93.8 94.1 92.3 92.8 92.1  PLT 32* 36* 46* 69* 397*   Basic Metabolic Panel: Recent Labs  Lab 12/28/18 1022 12/29/18 0807 12/31/18 0749 01/02/19 1154  NA 138 139 137 136  K 4.1 4.6 4.4 4.5  CL 101 100 98 97*  CO2 22 21* 24 25  GLUCOSE 127* 89 127* 143*  BUN 32*  42* 43* 52*  CREATININE 7.72* 8.81* 7.81* 6.84*  CALCIUM 8.5* 9.0 8.8* 8.6*  MG 2.1  --   --   --   PHOS  --  6.4* 3.6 3.6   Liver Function Tests: Recent Labs  Lab 12/29/18 0807 12/31/18 0749 01/02/19 1154  ALBUMIN 3.7 3.2* 3.3*    Radiology Studies: No results found.      Scheduled Meds: . albuterol  2.5 mg Nebulization TID  . amiodarone  200 mg Oral Daily  . atorvastatin  40 mg Oral QHS  . Chlorhexidine Gluconate Cloth  6 each Topical Q0600  . gabapentin  300 mg Oral QHS  . isosorbide mononitrate  30 mg Oral Daily  . metoprolol succinate  50 mg Oral Daily  . multivitamin  1 tablet Oral Daily  . pantoprazole  40 mg Oral QAC  breakfast  . predniSONE  40 mg Oral Q breakfast  . sodium chloride flush  3 mL Intravenous Q12H  . umeclidinium-vilanterol  1 puff Inhalation Daily   Continuous Infusions: . sodium chloride       LOS: 10 days     Vernell Leep, MD, FACP, [facility]. [facility]  To contact the attending provider between 7A-7P or the covering provider during after hours 7P-7A, please log into the web site [email or web address] and access using universal [place] password for that web site. If you do not have the password, please call the hospital operator.  01/03/2019, 3:26 PM

## 2019-01-03 NOTE — Progress Notes (Signed)
  Progress Note    01/03/2019 9:29 AM  Subjective:  Not having pain today  Vitals:   01/03/19 0640 01/03/19 0830  BP: 102/66   Pulse: 82   Resp: 18   Temp: 98.1 F (36.7 C)   SpO2: 100% 97%    Physical Exam: [id number] Bilateral femoral pulses are palpable Venous appearing changes of ble, healed left tma  CBC    Component Value Date/Time   WBC 9.9 01/02/2019 1056   RBC 4.19 (L) 01/02/2019 1056   HGB 11.6 (L) 01/02/2019 1056   HCT 38.6 (L) 01/02/2019 1056   PLT 101 (L) 01/02/2019 1056   MCV 92.1 01/02/2019 1056   MCH 27.7 01/02/2019 1056   MCHC 30.1 01/02/2019 1056   RDW 19.7 (H) 01/02/2019 1056   LYMPHSABS 1.0 12/27/2018 0442   MONOABS 0.8 12/27/2018 0442   EOSABS 0.4 12/27/2018 0442   BASOSABS 0.0 12/27/2018 0442    BMET    Component Value Date/Time   NA 136 01/02/2019 1154   K 4.5 01/02/2019 1154   CL 97 (L) 01/02/2019 1154   CO2 25 01/02/2019 1154   GLUCOSE 143 (H) 01/02/2019 1154   BUN 52 (H) 01/02/2019 1154   CREATININE 6.84 (H) 01/02/2019 1154   CALCIUM 8.6 (L) 01/02/2019 1154   GFRNONAA 8 (L) 01/02/2019 1154   GFRAA 9 (L) 01/02/2019 1154    INR    Component Value Date/Time   INR 1.2 12/10/2018 0246     Intake/Output Summary (Last 24 hours) at 01/03/2019 0929 Last data filed at 01/03/2019 0700 Gross per 24 hour  Intake 1080 ml  Output 2502 ml  Net -1422 ml     Assessment/plan:  63 y.o. male is here with esrd, right foot pain. Angiogram scheduled last week cancelled for thrombocytopenia now improved. Tentative plan for angiogram Tuesday.     Ariel Dimitri C. Donzetta Matters, MD [facility] Office: [phone] Pager: [phone]  01/03/2019 9:29 AM

## 2019-01-04 LAB — RENAL FUNCTION PANEL
Albumin: 3.5 g/dL (ref 3.5–5.0)
Anion gap: 16 — ABNORMAL HIGH (ref 5–15)
BUN: 65 mg/dL — ABNORMAL HIGH (ref 8–23)
CO2: 21 mmol/L — ABNORMAL LOW (ref 22–32)
Calcium: 8.7 mg/dL — ABNORMAL LOW (ref 8.9–10.3)
Chloride: 98 mmol/L (ref 98–111)
Creatinine, Ser: 6.75 mg/dL — ABNORMAL HIGH (ref 0.61–1.24)
GFR calc Af Amer: 9 mL/min — ABNORMAL LOW (ref >60)
GFR calc non Af Amer: 8 mL/min — ABNORMAL LOW (ref >60)
Glucose, Bld: 140 mg/dL — ABNORMAL HIGH (ref 70–99)
Phosphorus: 3 mg/dL (ref 2.5–4.6)
Potassium: 4.7 mmol/L (ref 3.5–5.1)
Sodium: 135 mmol/L (ref 135–145)

## 2019-01-04 LAB — CBC
HCT: 39.5 % (ref 39.0–52.0)
HCT: 38.9 % — ABNORMAL LOW (ref 39.0–52.0)
Hemoglobin: 12 g/dL — ABNORMAL LOW (ref 13.0–17.0)
Hemoglobin: 11.9 g/dL — ABNORMAL LOW (ref 13.0–17.0)
MCH: 27.8 pg (ref 26.0–34.0)
MCH: 28.1 pg (ref 26.0–34.0)
MCHC: 30.4 g/dL (ref 30.0–36.0)
MCHC: 30.6 g/dL (ref 30.0–36.0)
MCV: 91.6 fL (ref 80.0–100.0)
MCV: 91.7 fL (ref 80.0–100.0)
Platelets: 138 10*3/uL — ABNORMAL LOW (ref 150–400)
Platelets: 119 10*3/uL — ABNORMAL LOW (ref 150–400)
RBC: 4.31 MIL/uL (ref 4.22–5.81)
RBC: 4.24 MIL/uL (ref 4.22–5.81)
RDW: 19.4 % — ABNORMAL HIGH (ref 11.5–15.5)
RDW: 19.2 % — ABNORMAL HIGH (ref 11.5–15.5)
WBC: 10.3 10*3/uL (ref 4.0–10.5)
WBC: 10.3 10*3/uL (ref 4.0–10.5)
nRBC: 0 % (ref 0.0–0.2)
nRBC: 0 % (ref 0.0–0.2)

## 2019-01-04 MED ORDER — DIAZEPAM 5 MG PO TABS
10.0000 mg | ORAL_TABLET | Freq: Every evening | ORAL | Status: DC | PRN
  Administered 2019-01-04 – 2019-01-20 (×12): 10 mg via ORAL
  Filled 2019-01-04 (×13): qty 2

## 2019-01-04 MED ORDER — HEPARIN SODIUM (PORCINE) 1000 UNIT/ML IJ SOLN: 3800.0000 [IU] | Freq: Once | INTRAMUSCULAR | Status: DC

## 2019-01-04 MED ORDER — HEPARIN SODIUM (PORCINE) 1000 UNIT/ML IJ SOLN
INTRAMUSCULAR | Status: AC
  Filled 2019-01-04: qty 3

## 2019-01-04 MED ORDER — HEPARIN SODIUM (PORCINE) 1000 UNIT/ML IJ SOLN
3800.0000 [IU] | Freq: Once | INTRAMUSCULAR | Status: AC
  Administered 2019-01-04: 3800 [IU]

## 2019-01-04 MED ORDER — ACETAMINOPHEN 325 MG PO TABS
ORAL_TABLET | ORAL | Status: AC
  Filled 2019-01-04: qty 2

## 2019-01-04 NOTE — Progress Notes (Addendum)
IP PROGRESS NOTE  Subjective: Larry Klein has no new complaint.  Continues prednisone.  He reports no thrush.    Objective: Vital signs in last 24 hours: Blood pressure (!) 121/97, pulse 73, temperature (!) 97 F (36.1 C), resp. rate 18, height 5\' 11"  (1.803 m), weight 202 lb 4.8 oz (91.8 kg), SpO2 99 %.  Intake/Output from previous day: 04/19 0701 - 04/20 0700 In: 840 [P.O.:840] Out: -   Physical Exam: HEENT: No thrush Extremities: There is masslike fullness in the left lower leg, erythema at the right and left foot.  Trace pitting edema at the low leg bilaterally     Lab Results: Recent Labs    01/02/19 1056  WBC 9.9  HGB 11.6*  HCT 38.6*  PLT 101*    BMET Recent Labs    01/02/19 1154  NA 136  K 4.5  CL 97*  CO2 25  GLUCOSE 143*  BUN 52*  CREATININE 6.84*  CALCIUM 8.6*    No results found for: CEA1  Studies/Results: No results found.  Medications: I have reviewed the patient's current medications.  Assessment/Plan:  1.  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  Renal failure-on hemodialysis 3.  Coronary artery disease 4.  COPD 5.  Peripheral vascular disease 6.  Anemia 7.  CHF 8.  Left calf mass on CTA 12/28/2018, ultrasound 12/29/2018- solid mass without vascularity 9.  Elevated liver enzymes   The platelet count was higher on 01/02/2019.  The platelet count is pending today.  We will check the platelet count again tomorrow.  I discussed the case with vascular surgery.  They plan to proceed with an angiogram this week.  I will discuss the need for antiplatelet therapy with vascular surgery and cardiology.  We can consider a course of pulse Decadron therapy with the hope of obtaining a longer remission from the ITP.  The MRI of the left lower extremity is suggestive of a malignant process.  I discussed the MRI findings with Larry Klein.  We will make an orthopedic oncology referral as an  outpatient.    Recommendations: 1.  Continue prednisone, 2.  CBC 01/05/2019 3.  Outpatient follow-up at the [facility] cancer center and with orthopedic oncology will be arranged    LOS: 11 days   Betsy Coder, MD   01/04/2019, 8:06 AM

## 2019-01-04 NOTE — Consult Note (Signed)
[facility] [place]  Subjective  - pt stating nothing is happening in hospital frustrated by delays   Objective 102/60 60 98.2 F (36.8 C) (Oral) 18 97%  Intake/Output Summary (Last 24 hours) at 01/04/2019 2217 Last data filed at 01/04/2019 1905 Gross per 24 hour  Intake 240 ml  Output 2000 ml  Net -1760 ml   Right foot rubor unchanged  Assessment/Planning: Pt scheduled for aortogram with bilat runoff possible intervention Friday 4/24.  Discussed with pt risk benefits possible complications including but not limited to bleeding infection vessel injury.   Pt agitated his study was not being done tomorrow.  Tried to explain to pt that there is limited availability for a multitude of reasons but most of which are limited resources due to [place] pandemic.  He stated he understands but is frustrated.  If we have a slot open earlier in the week we will move him up  Also informed pt he possibly has a sarcoma in his left leg. I explained that this will need biopsy but that treatment if a sarcoma could involve amputation of his left leg.   He states that his medical service nor oncology had informed him about this.  I told him that Dr Benay Spice or Dr Algis Liming will arrange eval of this   From my standpoint pt could be dc'd home and return Friday if Dr Benay Spice believes his platelet count will be stable.  Ruta Hinds 01/04/2019 10:17 PM --  Laboratory Lab Results: Recent Labs    01/04/19 0816 01/04/19 1131  WBC 10.3 10.3  HGB 12.0* 11.9*  HCT 39.5 38.9*  PLT 138* 119*   BMET Recent Labs    01/02/19 1154 01/04/19 1131  NA 136 135  K 4.5 4.7  CL 97* 98  CO2 25 21*  GLUCOSE 143* 140*  BUN 52* 65*  CREATININE 6.84* 6.75*  CALCIUM 8.6* 8.7*    COAG Lab Results  Component Value Date   INR 1.2 12/10/2018   INR 1.18 09/18/2018   No results found for: PTT

## 2019-01-04 NOTE — Progress Notes (Signed)
Patient ID: Larry Klein, male   DOB: Nov 21, 1955, 63 y.o.   MRN: [id number] S: No new complaints or issues overnight. O:BP 130/84   Pulse 69   Temp (!) 97 F (36.1 C)   Resp 18   Ht 5\' 11"  (1.803 m)   Wt 91.8 kg Comment: scale a  SpO2 99%   BMI 28.22 kg/m   Intake/Output Summary (Last 24 hours) at 01/04/2019 0905 Last data filed at 01/04/2019 0600 Gross per 24 hour  Intake 600 ml  Output -  Net 600 ml   Intake/Output: I/O last 3 completed shifts: In: 1200 [P.O.:1200] Out: 0   Intake/Output this shift:  No intake/output data recorded. Weight change: -1.537 kg Gen: NAD CVS: no rub Resp: cta Abd: benign Ext: 1+ edema, mass of left calf, LAVF +T, [facility]  Recent Labs  Lab 12/28/18 1022 12/29/18 0807 12/31/18 0749 01/02/19 1154  NA 138 139 137 136  K 4.1 4.6 4.4 4.5  CL 101 100 98 97*  CO2 22 21* 24 25  GLUCOSE 127* 89 127* 143*  BUN 32* 42* 43* 52*  CREATININE 7.72* 8.81* 7.81* 6.84*  ALBUMIN  --  3.7 3.2* 3.3*  CALCIUM 8.5* 9.0 8.8* 8.6*  PHOS  --  6.4* 3.6 3.6   Liver Function Tests: Recent Labs  Lab 12/29/18 0807 12/31/18 0749 01/02/19 1154  ALBUMIN 3.7 3.2* 3.3*   No results for input(s): LIPASE, AMYLASE in the last 168 hours. No results for input(s): AMMONIA in the last 168 hours. CBC: Recent Labs  Lab 12/29/18 0807 12/31/18 0515 01/01/19 0450 01/02/19 1056  WBC 6.7 5.3 6.1 9.9  HGB 12.4* 10.9* 11.1* 11.6*  HCT 40.0 34.6* 36.0* 38.6*  MCV 94.1 92.3 92.8 92.1  PLT 36* 46* 69* 101*   Cardiac Enzymes: No results for input(s): CKTOTAL, CKMB, CKMBINDEX, TROPONINI in the last 168 hours. CBG: No results for input(s): GLUCAP in the last 168 hours.  Iron Studies: No results for input(s): IRON, TIBC, TRANSFERRIN, FERRITIN in the last 72 hours. Studies/Results: No results found. Marland Kitchen albuterol  2.5 mg Nebulization TID  . amiodarone  200 mg Oral Daily  . atorvastatin  40 mg Oral QHS  . Chlorhexidine Gluconate Cloth  6 each Topical Q0600  .  gabapentin  300 mg Oral QHS  . isosorbide mononitrate  30 mg Oral Daily  . metoprolol succinate  50 mg Oral Daily  . multivitamin  1 tablet Oral Daily  . pantoprazole  40 mg Oral QAC breakfast  . predniSONE  40 mg Oral Q breakfast  . sodium chloride flush  3 mL Intravenous Q12H  . umeclidinium-vilanterol  1 puff Inhalation Daily    BMET    Component Value Date/Time   NA 136 01/02/2019 1154   K 4.5 01/02/2019 1154   CL 97 (L) 01/02/2019 1154   CO2 25 01/02/2019 1154   GLUCOSE 143 (H) 01/02/2019 1154   BUN 52 (H) 01/02/2019 1154   CREATININE 6.84 (H) 01/02/2019 1154   CALCIUM 8.6 (L) 01/02/2019 1154   GFRNONAA 8 (L) 01/02/2019 1154   GFRAA 9 (L) 01/02/2019 1154   CBC    Component Value Date/Time   WBC 9.9 01/02/2019 1056   RBC 4.19 (L) 01/02/2019 1056   HGB 11.6 (L) 01/02/2019 1056   HCT 38.6 (L) 01/02/2019 1056   PLT 101 (L) 01/02/2019 1056   MCV 92.1 01/02/2019 1056   MCH 27.7 01/02/2019 1056   MCHC 30.1 01/02/2019 1056   RDW 19.7 (H) 01/02/2019  1056   LYMPHSABS 1.0 12/27/2018 0442   MONOABS 0.8 12/27/2018 0442   EOSABS 0.4 12/27/2018 0442   BASOSABS 0.0 12/27/2018 0442    Dialysis Orders: TTS at [place] 4h   400/800    92.5kg 3K/2.25 bath  P2   LIJ TDC   Heparin none -Calcitriol 0.5 mcg PO TIW  Assessment/Plan: 1. R foot erythema: Hx L TMA.VVS planning arteriogram tentatively tomorrow if platelets continue to improve.  2. Thrombocytopenia: acute on chronic issue, negative HIT. Hem/onc treating as ITP, getting course of prednisone and platelets rising daily 3. 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 To follow up with ortho-onc as an outpatient. 4. ESRD: Continue HD per TTS schedule.  Off schedule.  Will plan for HD today since he has angiogram tomorrow and will get back on schedule Thursday.  5. BP/volume: BP ok, does have some LE edema. At dry wt.  6. Chest pain/ hx CABG- seen by Dr Debara Pickett.Concerned about the cardiac  catheterization in setting of thrombocytopenia. Had recent Myoview with fixed defect. No plans to proceed with cath and PCI at this time due to the thrombocytopenia andrisk of bleeding. Continues on Imdur 30 mg daily 7. Congestive heart failure: LVEF 25% 8. Anemia of CKD: Hgb 11.1 - no ESA for now. 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 Continue home meds. 10. Hyperlipidemia continues on atorvastatin 11. GERD continues on Protonix 12. Atrial fibrillation: Not on anticoagulation. TSH 2.734  Donetta Potts, MD [employer] [phone]

## 2019-01-04 NOTE — Progress Notes (Addendum)
Patient in HD.

## 2019-01-04 NOTE — Progress Notes (Signed)
Pt states he would like to speak with Cardiologist, Nephrologist, and Hematologist in the AM.

## 2019-01-04 NOTE — Progress Notes (Signed)
PROGRESS NOTE   Larry Klein  HKV:[id number]    DOB: 1956/03/14    DOA: 12/23/2018  PCP: Patient, No Pcp Per   I have briefly reviewed patients previous medical records in [facility].  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 fib with RVR, chronic thrombocytopenia, anemia, COPD, HTN, admitted on 12/23/2018 with complaints of chest pain and dyspnea on exertion, initially presented to [facility] ED where CTA chest was negative for PE, transferred to [facility] for cardiology evaluation.  Cardiology evaluated for unstable angina, was on heparin drip, felt not a candidate for surgical or percutaneous revascularization (prior CABG, thrombocytopenia), added Imdur and signed off.  COVID testing negative.  Nephrology consulting for HD needs.Vascular surgery consulted for right foot pain and plan arteriogram 4/21 pending further improvement in thrombocytopenia.  Hematology consulted for thrombocytopenia.   Assessment & Plan:   Principal Problem:   Chest pain Active Problems:   Dyspnea   ESRD (end stage renal disease) on dialysis (HCC)   Atrial fibrillation with RVR (HCC)   Acute on chronic systolic heart failure (HCC)   Cardiomyopathy (HCC)   PVD (peripheral vascular disease) ([place])    CAD with unstable angina - Cardiology was consulted and assisted with evaluation and management.  Please see following note copied from cardiology signoff note 12/26/2018:  - "the ECG does not show new ischemic changes, the troponin elevation is mild and "flat", a very recent nuclear stress test only showed a small area of apical ischemia. Not a candidate for surgical or percutaneous revascularization (previous CABG, thrombocytopenia). Medical therapy is indicated, but is also somewhat limited due to low BP. On beta blocker and statin.  Can try to increase isosorbide to 60 mg daily, if post-HD BP allows."  - Has not complained of any further chest pain.  Continue atorvastatin, Imdur, Toprol-XL.  Not on aspirin due to thrombocytopenia. -Patient had a follow-up ED visit on 4/17 with Dr. Agustin Cree but remained hospitalized and hence this will need to be rescheduled at time of discharge.  Acute on Chronic systolic CHF - Volume management across HD.  Seems clinically compensated at this time. - Continue Toprol XL 50 MG daily. Not on ACEI/arb , he has renal dysfucntion/hypotension. - Patient previously was on Lasix and had asked regarding going back on Lasix so that he can continue to make urine.  I reviewed his chart with Nephrology today, has not been on Lasix for several weeks and they recommended monitoring off of Lasix and continue volume management across HD.  Persistent Atrial fibrillation: - Rate controlled on amiodarone, Toprol-XL. Not on anticoagulation due to thrombocytopenia.  Stable.  Mild transaminitis suspect due to CHF.  - Acute hepatitis panel Neg. RUQ [place] s/p cholecystectomy, no acute findings, -Mild transaminitis on 4/12.  Will add LFTs to today's labs and review.  ESRD on HD TTS, - hypotension limiting his ultrafiltration. Also on Imdur and metoprolol dose may need to be adjusted to prevent hypotension at HD. Nephrology continues to see for HD needs. -Patient being dialyzed off schedule 4/20 due to angiogram planned for 4/21.  He will be back on his TTS schedule from Thursday.  I discussed with Dr. Marval Regal.  Anemia of chronic renal disease.  - Hemoglobin is stable. no ESA as outpatient.  Anemia panel 4/12: Iron 31, TIBC 269, saturation ratio 12, ferritin 425,  folate 23, B12: 1263.  Lower ext swelling,s/p traumatic Left TMA old, and Right leg swelling worse than left w erythema - skin changes, suspecting PVD as per ABI. No DVT in duplex. Vascular surgery consulted and s/pCTA 4/1,not a  candidate for bypass. - Vascular planning for arteriogram on 4/21 pending further improvement in thrombocytopenia.  -Started trial of neurontin.  No lower extremity pain reported over the last 2 days.  COPD: - Stable. Continue his nebulizer and anoro Ellipta.  COVID 19 is negative.   Acute on Chronic thrombocytopenia - suspecting chronic ITP. Has been low since January patient reports it has been very low. Seen as low as 30,000. HITTwasneg previous admission3/27. He has been referred to outpatient hematology in [place].Currently no obvious bleeding and platelets are low but stable. -Discussed with [name], Hematology on 4/20.  His input appreciated.  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  Trial of prednisone started 12/29/2018.  Platelet counts have improved to 138/119 today.  He has discussed with vascular surgery who plan to proceed with angiogram.  He is considering a course of pulse Decadron therapy with hope of longer remission from ITP and will arrange follow-up at [place] center. -Follow CBC daily.  Hypodenselesionin the lower pole of the left kidney  - incidentally noted on the CTAscan, nonspecific could be seen non specific- cT US renal.  "Atrophic kidneys bilaterally similar to that seen on prior CT.  Lower pole nonobstructing stone on the left.  Cystic lesions in the left kidney which correspond to the hyperdense lesions on prior CT." -This will need to be followed up closely as outpatient.  Largemassin the left calf - incidentally noted on the CTA,  - Left lower extremity ultrasound there was no significant vascularity noted and could represent a posttraumatic hematoma.  - MRI suggest possible malignant/sarcoma.   -I discussed these findings with [name], Hematology/Oncology who indicated that he will refer him to [facility] as outpatient for evaluation.  GERD  - on Protonix.  Hyperlipidemia  Continue statins.  DVT prophylaxis: SCDs. Code Status: Full Family Communication: None at bedside Disposition: Not medically ready for discharge yet   Consultants:  Nephrology Vascular surgery Hematology/oncology Cardiology  Procedures:  HD  Antimicrobials:  None   Subjective: Patient seen this morning prior to procedure.  Denies complaints.  No chest pain or dyspnea.  No lower extremity pain.  ROS: As above, otherwise negative.  Objective:  Vitals:   01/04/19 1525 01/04/19 1535 01/04/19 1600 01/04/19 1635  BP: (!) 110/51 105/79 92/68 103/65  Pulse: 62 60 60 84  Resp: 20 20 18    Temp: 98.2 F (36.8 C)     TempSrc: Oral     SpO2: 99%     Weight: 92.9 kg     Height:        Examination:  General exam: Pleasant middle-age male, moderately built and nourished sitting up comfortably in chair this morning. Respiratory system: Clear to auscultation.  No increased work of breathing. Cardiovascular system: S1 & S2 heard, RRR. No JVD, murmurs, rubs, gallops or clicks.  Trace ankle edema. Gastrointestinal system: Abdomen is nondistended, soft and nontender. No organomegaly or masses felt. Normal bowel sounds heard.  Stable. Central nervous system: Alert and oriented. No focal neurological deficits.  Stable. Extremities: Symmetric 5 x 5 power.  Left TMA healed stump.  Mild patchy redness and trace edema of both ankles and foot.  No increased warmth. Skin: No rashes, lesions or ulcers Psychiatry: Judgement  and insight appear normal. Mood & affect appropriate.     Data Reviewed: I have personally reviewed following labs and imaging studies  CBC: Recent Labs  Lab 12/31/18 0515 01/01/19 0450 01/02/19 1056 01/04/19 0816 01/04/19 1131  WBC 5.3 6.1 9.9 10.3 10.3  HGB 10.9* 11.1* 11.6* 12.0* 11.9*  HCT 34.6* 36.0* 38.6* 39.5 38.9*  MCV 92.3 92.8 92.1 91.6 91.7  PLT 46* 69* 101* 138* 614*   Basic Metabolic Panel: Recent Labs  Lab 12/29/18 0807 12/31/18 0749  01/02/19 1154 01/04/19 1131  NA 139 137 136 135  K 4.6 4.4 4.5 4.7  CL 100 98 97* 98  CO2 21* 24 25 21*  GLUCOSE 89 127* 143* 140*  BUN 42* 43* 52* 65*  CREATININE 8.81* 7.81* 6.84* 6.75*  CALCIUM 9.0 8.8* 8.6* 8.7*  PHOS 6.4* 3.6 3.6 3.0   Liver Function Tests: Recent Labs  Lab 12/29/18 0807 12/31/18 0749 01/02/19 1154 01/04/19 1131  ALBUMIN 3.7 3.2* 3.3* 3.5    Radiology Studies: No results found.      Scheduled Meds: . albuterol  2.5 mg Nebulization TID  . amiodarone  200 mg Oral Daily  . atorvastatin  40 mg Oral QHS  . Chlorhexidine Gluconate Cloth  6 each Topical Q0600  . gabapentin  300 mg Oral QHS  . isosorbide mononitrate  30 mg Oral Daily  . metoprolol succinate  50 mg Oral Daily  . multivitamin  1 tablet Oral Daily  . pantoprazole  40 mg Oral QAC breakfast  . predniSONE  40 mg Oral Q breakfast  . sodium chloride flush  3 mL Intravenous Q12H  . umeclidinium-vilanterol  1 puff Inhalation Daily   Continuous Infusions: . sodium chloride       LOS: 11 days     Vernell Leep, MD, FACP, [facility]. [facility]  To contact the attending provider between 7A-7P or the covering provider during after hours 7P-7A, please log into the web site [email or web address] and access using universal [place] password for that web site. If you do not have the password, please call the hospital operator.  01/04/2019, 4:56 PM

## 2019-01-05 LAB — CBC
HCT: 38.4 % — ABNORMAL LOW (ref 39.0–52.0)
Hemoglobin: 11.8 g/dL — ABNORMAL LOW (ref 13.0–17.0)
MCH: 27.8 pg (ref 26.0–34.0)
MCHC: 30.7 g/dL (ref 30.0–36.0)
MCV: 90.6 fL (ref 80.0–100.0)
Platelets: 141 10*3/uL — ABNORMAL LOW (ref 150–400)
RBC: 4.24 MIL/uL (ref 4.22–5.81)
RDW: 19.3 % — ABNORMAL HIGH (ref 11.5–15.5)
WBC: 11.2 10*3/uL — ABNORMAL HIGH (ref 4.0–10.5)
nRBC: 0 % (ref 0.0–0.2)

## 2019-01-05 MED ORDER — PREDNISONE 20 MG PO TABS
40.0000 mg | ORAL_TABLET | Freq: Every day | ORAL | Status: DC
  Administered 2019-01-05: 40 mg via ORAL
  Filled 2019-01-05: qty 2

## 2019-01-05 MED ORDER — PREDNISONE 20 MG PO TABS
30.0000 mg | ORAL_TABLET | Freq: Every day | ORAL | Status: DC
  Administered 2019-01-06 – 2019-01-12 (×6): 30 mg via ORAL
  Filled 2019-01-05 (×7): qty 1

## 2019-01-05 NOTE — Progress Notes (Signed)
Patient ID: Larry Klein, male   DOB: 31-Aug-1956, 63 y.o.   MRN: [id number] S: Frustrated that his angiogram is not until Friday.  O:BP 109/84 (BP Location: Right Arm)   Pulse 68   Temp (!) 97.5 F (36.4 C) (Oral)   Resp 18   Ht 5\' 11"  (1.803 m)   Wt 90.8 kg   SpO2 100%   BMI 27.92 kg/m   Intake/Output Summary (Last 24 hours) at 01/05/2019 1139 Last data filed at 01/05/2019 0854 Gross per 24 hour  Intake 1080 ml  Output 2000 ml  Net -920 ml   Intake/Output: I/O last 3 completed shifts: In: 1 [P.O.:960] Out: 2000 [Other:2000]  Intake/Output this shift:  Total I/O In: 480 [P.O.:480] Out: -  Weight change: 1.137 kg Gen: NAD CVS: no rub Resp: cta Abd: benign Ext: 1+ edema, marked discoloration of bilateral lower ext.  Recent Labs  Lab 12/31/18 0749 01/02/19 1154 01/04/19 1131  NA 137 136 135  K 4.4 4.5 4.7  CL 98 97* 98  CO2 24 25 21*  GLUCOSE 127* 143* 140*  BUN 43* 52* 65*  CREATININE 7.81* 6.84* 6.75*  ALBUMIN 3.2* 3.3* 3.5  CALCIUM 8.8* 8.6* 8.7*  PHOS 3.6 3.6 3.0   Liver Function Tests: Recent Labs  Lab 12/31/18 0749 01/02/19 1154 01/04/19 1131  ALBUMIN 3.2* 3.3* 3.5   No results for input(s): LIPASE, AMYLASE in the last 168 hours. No results for input(s): AMMONIA in the last 168 hours. CBC: Recent Labs  Lab 01/01/19 0450 01/02/19 1056 01/04/19 0816 01/04/19 1131 01/05/19 0432  WBC 6.1 9.9 10.3 10.3 11.2*  HGB 11.1* 11.6* 12.0* 11.9* 11.8*  HCT 36.0* 38.6* 39.5 38.9* 38.4*  MCV 92.8 92.1 91.6 91.7 90.6  PLT 69* 101* 138* 119* 141*   Cardiac Enzymes: No results for input(s): CKTOTAL, CKMB, CKMBINDEX, TROPONINI in the last 168 hours. CBG: No results for input(s): GLUCAP in the last 168 hours.  Iron Studies: No results for input(s): IRON, TIBC, TRANSFERRIN, FERRITIN in the last 72 hours. Studies/Results: No results found. Marland Kitchen albuterol  2.5 mg Nebulization TID  . amiodarone  200 mg Oral Daily  . atorvastatin  40 mg Oral QHS  .  Chlorhexidine Gluconate Cloth  6 each Topical Q0600  . gabapentin  300 mg Oral QHS  . isosorbide mononitrate  30 mg Oral Daily  . metoprolol succinate  50 mg Oral Daily  . multivitamin  1 tablet Oral Daily  . pantoprazole  40 mg Oral QAC breakfast  . [START ON 01/06/2019] predniSONE  30 mg Oral Q breakfast  . sodium chloride flush  3 mL Intravenous Q12H  . umeclidinium-vilanterol  1 puff Inhalation Daily    BMET    Component Value Date/Time   NA 135 01/04/2019 1131   K 4.7 01/04/2019 1131   CL 98 01/04/2019 1131   CO2 21 (L) 01/04/2019 1131   GLUCOSE 140 (H) 01/04/2019 1131   BUN 65 (H) 01/04/2019 1131   CREATININE 6.75 (H) 01/04/2019 1131   CALCIUM 8.7 (L) 01/04/2019 1131   GFRNONAA 8 (L) 01/04/2019 1131   GFRAA 9 (L) 01/04/2019 1131   CBC    Component Value Date/Time   WBC 11.2 (H) 01/05/2019 0432   RBC 4.24 01/05/2019 0432   HGB 11.8 (L) 01/05/2019 0432   HCT 38.4 (L) 01/05/2019 0432   PLT 141 (L) 01/05/2019 0432   MCV 90.6 01/05/2019 0432   MCH 27.8 01/05/2019 0432   MCHC 30.7 01/05/2019 0432   RDW  19.3 (H) 01/05/2019 0432   LYMPHSABS 1.0 12/27/2018 0442   MONOABS 0.8 12/27/2018 0442   EOSABS 0.4 12/27/2018 0442   BASOSABS 0.0 12/27/2018 0442   Dialysis Orders: TTS at [place] 4h400/800 92.5kg 3K/2.25bath P2 LIJTDC Heparin none -Calcitriol 0.5 mcg PO TIW  Assessment/Plan: 1. R foot erythema: Hx L TMA.[facility] planning arteriogram 01/08/19. 2. Thrombocytopenia:acute on chronic issue, negative HIT.Hem/onc treating as ITP,getting course of prednisoneand platelets rising daily 3. 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 To follow up with ortho-onc as an outpatient. 2. Possible biopsy while he remains an inpatient to expedite treatment 4. ESRD: Off schedule.  Will plan for HD tomorrow since his angiogram is not scheduled until 01/08/19 and will get back on schedule Saturday if he is still an inpatient.  5. BP/volume:BP ok,  does have some LE edema. Currently below his dry wt.   Will continue to challenge edw as tolerated.  6. Chest pain/ hx CABG- seen by Dr Debara Pickett.Concerned about the cardiac catheterization in setting of thrombocytopenia. Had recent Myoview with fixed defect.No plans to proceed with cath and PCI at this time due to the thrombocytopenia andrisk of bleeding. Continues on Imdur 30 mg daily 7. Vascular access- failed left cimino avf.  Will ask [facility] if they could evaluate for possible AVG/AVF placement while he remains an inpatient.  8. Congestive heart failure:LVEF 25% 9. Anemia of CKD:Hgb 11.1 - no ESA for now. 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 Continue home meds. 11. Hyperlipidemia continues on atorvastatin 12. GERD continues on Protonix 13. Atrial fibrillation: Not on anticoagulation. TSH 2.734  Donetta Potts, MD [employer] [phone]

## 2019-01-05 NOTE — Progress Notes (Signed)
Initial Nutrition Assessment  RD working remotely.  DOCUMENTATION CODES:   Not applicable  INTERVENTION:   -Continue renal MVI daily -Continue with liberalized diet of Heart healthy  NUTRITION DIAGNOSIS:   Increased nutrient needs related to chronic illness(ESRD on HD) as evidenced by estimated needs.  GOAL:   Patient will meet greater than or equal to 90% of their needs  MONITOR:   PO intake, Supplement acceptance, Labs, Weight trends, Skin, I & O's  REASON FOR ASSESSMENT:   LOS    ASSESSMENT:   Larry Klein is a 63 y.o. male with medical history significant of CAD, COPD, hypertension, ESRD on dialysis.  He reports chest pressure and tightness across his entire chest as well as dyspnea with exertion over the past few days.  This is constant in nature.  He denies any fevers or cough.  Had some nausea without vomiting, no diarrhea.  No worsening peripheral edema from his baseline.  He presented to [facility], underwent CTA chest which was negative for PE or any acute findings.  ED physician discussed with [facility] cardiology, was recommended to transfer to [facility] and start on IV heparin.  Patient was apparently also tested for low risk COVID-19 rule out.  Pt admitted with CHF, chest pain, and persistent a-fib.   4/12- COVID-19 negative 4/13- CTA revealed lt calf mass 4/14- oncology consult; prednisone started due to thrombocytopenia likely related to ITP; ultrasound revealed solid lt calf mass without vascularity  Reviewed I/O's: -1.4 L x 24 hours and -2.3 L since admission  UOP: 0 ml x 24 hours  Spoke with pt on telephone, who was pleasant and good spirits. He reports he has a good appetite and has been consuming almost all of his meals that he has been receiving. Per doc flowsheets, meal completion 100%. PTA, pt reports good appetite, usually consuming 2 large meals (Breakfast and Dinner) and a snack in the middle of the day ("that's just  the way things work when you live by yourself").   Pt denies any weight loss. Per nephrology notes, dry wt 92.5 kg. Last HD 01/04/19. He reports "y'all are making me gain weight here". Discussed importance of continued good meal and supplement intake to promote healing. Pt with no further questions or concerns, however, expressed appreciation for RD calling him.   Per vascular surgery notes, plan foraortogram with bilat runoff possible intervention on 01/08/19. Pt with possible sarcoma in lt leg; he has been referred to orthopedic oncology.   Medications reviewed and include prednisone.  Labs reviewed: K and Phos WDL.   NUTRITION - FOCUSED PHYSICAL EXAM:    Most Recent Value  Orbital Region  Unable to assess  Upper Arm Region  Unable to assess  Thoracic and Lumbar Region  Unable to assess  Buccal Region  Unable to assess  Temple Region  Unable to assess  Clavicle Bone Region  Unable to assess  Clavicle and Acromion Bone Region  Unable to assess  Scapular Bone Region  Unable to assess  Dorsal Hand  Unable to assess  Patellar Region  Unable to assess  Anterior Thigh Region  Unable to assess  Posterior Calf Region  Unable to assess  Edema (RD Assessment)  Unable to assess  Hair  Unable to assess  Eyes  Unable to assess  Mouth  Unable to assess  Skin  Unable to assess  Nails  Unable to assess       Diet Order:   Diet Order  Diet Heart Room service appropriate? Yes; Fluid consistency: Thin  Diet effective now              EDUCATION NEEDS:   Education needs have been addressed  Skin:  Skin Assessment: Reviewed RN Assessment  Last BM:  01/04/19  Height:   Ht Readings from Last 1 Encounters:  12/26/18 5\' 11"  (1.803 m)    Weight:   Wt Readings from Last 1 Encounters:  01/05/19 90.8 kg    Ideal Body Weight:  78.2 kg  BMI:  Body mass index is 27.92 kg/m.  Estimated Nutritional Needs:   Kcal:  1950-2150  Protein:  100-115 grams  Fluid:  1000 ml +  UOP    Symphany Fleissner A. Jimmye Norman, RD, LDN, [place] Registered Dietitian II Certified Diabetes Care and Education Specialist Pager: [phone] After hours Pager: [phone]

## 2019-01-05 NOTE — Progress Notes (Addendum)
IP PROGRESS NOTE  Subjective: Larry Klein has no new complaints.  Continues prednisone.  He reports no thrush.   Objective: Vital signs in last 24 hours: Blood pressure 116/60, pulse 77, temperature 97.7 F (36.5 C), temperature source Oral, resp. rate 18, height 5\' 11"  (1.803 m), weight 200 lb 3.2 oz (90.8 kg), SpO2 100 %.  Intake/Output from previous day: 04/20 0701 - 04/21 0700 In: 600 [P.O.:600] Out: 2000   Physical Exam: HEENT: No thrush Extremities: There is masslike fullness in the left lower leg, erythema at the right and left foot.  Trace pitting edema at the low leg bilaterally  Lab Results: Recent Labs    01/04/19 1131 01/05/19 0432  WBC 10.3 11.2*  HGB 11.9* 11.8*  HCT 38.9* 38.4*  PLT 119* 141*    BMET Recent Labs    01/02/19 1154 01/04/19 1131  NA 136 135  K 4.5 4.7  CL 97* 98  CO2 25 21*  GLUCOSE 143* 140*  BUN 52* 65*  CREATININE 6.84* 6.75*  CALCIUM 8.6* 8.7*    No results found for: CEA1  Studies/Results: No results found.  Medications: I have reviewed the patient's current medications.  Assessment/Plan:  1.  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  Renal failure-on hemodialysis 3.  Coronary artery disease 4.  COPD 5.  Peripheral vascular disease 6.  Anemia 7.  CHF 8.  Left calf mass on CTA 12/28/2018, ultrasound 12/29/2018- solid mass without vascularity 9.  Elevated liver enzymes   The platelet count continues to improve.  Platelets are up to 141,000 this morning. Vascular surgery planning to proceed with an angiogram this week.  Currently scheduled for Friday 01/08/2019.  Will discuss the need for antiplatelet therapy with vascular surgery and cardiology.  We can consider a course of pulse Decadron therapy with the hope of obtaining a longer remission from the ITP.  The MRI of the left lower extremity is suggestive of a malignant process.  Results have previously been  discussed with the patient.  We will make an orthopedic oncology referral as an outpatient.   Recommendations: 1.  Continue prednisone, but will reduce the dose to 30 mg daily. 2.  CBC 01/06/2019 3.  Outpatient follow-up at the [facility] cancer center and with orthopedic oncology will be arranged    LOS: 12 days   [name], NP   01/05/2019, 10:03 AM  Larry Klein appears unchanged.  The thrombocytopenia has responded to prednisone.  He will have the angiogram procedure this week.  I recommend reaching out to cardiology to see if antiplatelet therapy is recommended for the coronary artery disease.  He may be a candidate for chronic antiplatelet therapy if the platelet count remains adequate.  I recommend a trial of pulse Decadron to begin later this week.

## 2019-01-05 NOTE — Progress Notes (Signed)
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 fib with RVR, chronic thrombocytopenia, anemia, COPD, HTN, admitted on 12/23/2018 with complaints of chest pain and dyspnea on exertion, initially presented to [facility] ED where CTA chest was negative for PE, transferred to [facility] for cardiology evaluation.  Cardiology evaluated for unstable angina, was on heparin drip, felt not a candidate for surgical or percutaneous revascularization (prior CABG, thrombocytopenia), added Imdur and signed off.  COVID testing negative.  Nephrology consulting for HD needs.Vascular surgery consulted for right foot pain and plan arteriogram 4/21 pending further improvement in thrombocytopenia.  Hematology consulted for thrombocytopenia.   Assessment & Plan:   Principal Problem:   Chest pain Active Problems:   Dyspnea   ESRD (end stage renal disease) on dialysis (HCC)   Atrial fibrillation with RVR (HCC)   Acute on chronic systolic heart failure (HCC)   Cardiomyopathy (HCC)   PVD (peripheral vascular disease) ([place])   Coronary artery disease with unstable angina Cardiology consulted and recommended not a candidate for surgical or percutaneous revascularization.  Initially patient had low platelets and antiplatelet therapy was not indicated at that time.  Currently platelets have improved and we will get in touch with cardiology to see if aspirin can be restarted.   Acute on chronic systolic heart failure Continue with volume management as per nephrology with hemodialysis Continue with beta-blocker,.    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 Hematology consulted recommended pulsed Decadron therapy  and outpatient follow-up.  Platelets have improved dramatically.    Persistent atrial fibrillation Rate controlled with amiodarone and Toprol at this time patient is not on anticoagulation due to thrombocytopenia.   Mild transaminitis due to CHF Patient denies any nausea vomiting or abdominal pain at this time.  Acute hepatitis panel negative.    End-stage renal disease on hemodialysis Nephrology on board.   Lower extremity swelling status post left TMA and right leg swelling Probably from peripheral vascular disease.  No DVT Venous duplex.  Vascular surgery consulted and plan for arteriogram on Friday.   COPD No wheezing heard continue with nebs as needed.  DVT prophylaxis: none. scd's couldn't be applied due to leg tenderness and swelling.  Code Status: full code.  Family Communication: none at bedside.  Disposition Plan: pending further work up.   Consultants:  Dr Oneida Alar with vascular surgery.  Nephrology with Dr Marval Regal.  Hematology Dr Benay Spice.    Procedures:  Arteriogram scheduled for Friday.   Antimicrobials:none.   Subjective: Frustrated that arteriogram isn ot going to happen till Friday.  Objective: Vitals:   01/05/19 0753 01/05/19 0853 01/05/19 1119 01/05/19 1422  BP:  116/60 109/84   Pulse:  77 68   Resp:  18 18   Temp:  97.7 F (36.5 C) (!) 97.5 F (36.4 C)   TempSrc:  Oral Oral   SpO2: 100% 100% 100% 99%  Weight:      Height:        Intake/Output Summary (Last 24 hours) at 01/05/2019 1521 Last data filed at 01/05/2019 1224 Gross per 24 hour  Intake 1200 ml  Output 2000 ml  Net -800 ml   Filed Weights   01/04/19 1525 01/04/19 1905 01/05/19 0025  Weight: 92.9  kg 90.8 kg 90.8 kg    Examination:  General exam: not in distress calm and comfortable.  Respiratory system: Clear to auscultation. Respiratory effort normal. Cardiovascular system: S1 & S2 heard, RRR.  Gastrointestinal system: Abdomen is nondistended, soft and nontender. .  Normal bowel sounds heard. Central nervous system: Alert and oriented. No focal neurological deficits. Extremities: bilateral lower extremity swelling and tenderness present. Left metatarsal amputation.  Skin: No rashes, lesions or ulcers Psychiatry: angry, and anxious.     Data Reviewed: I have personally reviewed following labs and imaging studies  CBC: Recent Labs  Lab 01/01/19 0450 01/02/19 1056 01/04/19 0816 01/04/19 1131 01/05/19 0432  WBC 6.1 9.9 10.3 10.3 11.2*  HGB 11.1* 11.6* 12.0* 11.9* 11.8*  HCT 36.0* 38.6* 39.5 38.9* 38.4*  MCV 92.8 92.1 91.6 91.7 90.6  PLT 69* 101* 138* 119* 785*   Basic Metabolic Panel: Recent Labs  Lab 12/31/18 0749 01/02/19 1154 01/04/19 1131  NA 137 136 135  K 4.4 4.5 4.7  CL 98 97* 98  CO2 24 25 21*  GLUCOSE 127* 143* 140*  BUN 43* 52* 65*  CREATININE 7.81* 6.84* 6.75*  CALCIUM 8.8* 8.6* 8.7*  PHOS 3.6 3.6 3.0   GFR: Estimated Creatinine Clearance: 12.7 mL/min (A) (by C-G formula based on SCr of 6.75 mg/dL (H)). Liver Function Tests: Recent Labs  Lab 12/31/18 0749 01/02/19 1154 01/04/19 1131  ALBUMIN 3.2* 3.3* 3.5   No results for input(s): LIPASE, AMYLASE in the last 168 hours. No results for input(s): AMMONIA in the last 168 hours. Coagulation Profile: No results for input(s): INR, PROTIME in the last 168 hours. Cardiac Enzymes: No results for input(s): CKTOTAL, CKMB, CKMBINDEX, TROPONINI in the last 168 hours. BNP (last 3 results) No results for input(s): PROBNP in the last 8760 hours. HbA1C: No results for input(s): HGBA1C in the last 72 hours. CBG: No results for input(s): GLUCAP in the last 168 hours. Lipid Profile: No results for input(s): CHOL, HDL, LDLCALC, TRIG, CHOLHDL, LDLDIRECT in the last 72 hours. Thyroid Function Tests: No results for input(s): TSH, T4TOTAL, FREET4, T3FREE, THYROIDAB in the last 72 hours. Anemia Panel: No results for input(s): VITAMINB12, FOLATE, FERRITIN, TIBC, IRON, RETICCTPCT in  the last 72 hours. Sepsis Labs: No results for input(s): PROCALCITON, LATICACIDVEN in the last 168 hours.  No results found for this or any previous visit (from the past 240 hour(s)).       Radiology Studies: No results found.      Scheduled Meds: . albuterol  2.5 mg Nebulization TID  . amiodarone  200 mg Oral Daily  . atorvastatin  40 mg Oral QHS  . Chlorhexidine Gluconate Cloth  6 each Topical Q0600  . gabapentin  300 mg Oral QHS  . isosorbide mononitrate  30 mg Oral Daily  . metoprolol succinate  50 mg Oral Daily  . multivitamin  1 tablet Oral Daily  . pantoprazole  40 mg Oral QAC breakfast  . [START ON 01/06/2019] predniSONE  30 mg Oral Q breakfast  . sodium chloride flush  3 mL Intravenous Q12H  . umeclidinium-vilanterol  1 puff Inhalation Daily   Continuous Infusions: . sodium chloride       LOS: 12 days    Time spent: 42 minutes.     Hosie Poisson, MD [facility] Pager [phone]  If 7PM-7AM, please contact night-coverage [email or web address] Password [email or web address] 01/05/2019, 3:21 PM

## 2019-01-05 NOTE — Care Management Important Message (Signed)
Important Message  Patient Details  Name: Larry Klein MRN: [id number] Date of Birth: 06-04-1956   Medicare Important Message Given:  Yes    Orbie Pyo 01/05/2019, 2:18 PM

## 2019-01-06 DIAGNOSIS — I739 Peripheral vascular disease, unspecified: Secondary | ICD-10-CM

## 2019-01-06 LAB — RENAL FUNCTION PANEL
Albumin: 3.3 g/dL — ABNORMAL LOW (ref 3.5–5.0)
Anion gap: 16 — ABNORMAL HIGH (ref 5–15)
BUN: 74 mg/dL — ABNORMAL HIGH (ref 8–23)
CO2: 23 mmol/L (ref 22–32)
Calcium: 8.4 mg/dL — ABNORMAL LOW (ref 8.9–10.3)
Chloride: 98 mmol/L (ref 98–111)
Creatinine, Ser: 6.74 mg/dL — ABNORMAL HIGH (ref 0.61–1.24)
GFR calc Af Amer: 9 mL/min — ABNORMAL LOW (ref >60)
GFR calc non Af Amer: 8 mL/min — ABNORMAL LOW (ref >60)
Glucose, Bld: 149 mg/dL — ABNORMAL HIGH (ref 70–99)
Phosphorus: 2.5 mg/dL (ref 2.5–4.6)
Potassium: 3.7 mmol/L (ref 3.5–5.1)
Sodium: 137 mmol/L (ref 135–145)

## 2019-01-06 LAB — CBC
HCT: 37.4 % — ABNORMAL LOW (ref 39.0–52.0)
Hemoglobin: 11.8 g/dL — ABNORMAL LOW (ref 13.0–17.0)
MCH: 28.6 pg (ref 26.0–34.0)
MCHC: 31.6 g/dL (ref 30.0–36.0)
MCV: 90.8 fL (ref 80.0–100.0)
Platelets: 136 10*3/uL — ABNORMAL LOW (ref 150–400)
RBC: 4.12 MIL/uL — ABNORMAL LOW (ref 4.22–5.81)
RDW: 19.4 % — ABNORMAL HIGH (ref 11.5–15.5)
WBC: 12.2 10*3/uL — ABNORMAL HIGH (ref 4.0–10.5)
nRBC: 0 % (ref 0.0–0.2)

## 2019-01-06 LAB — GLUCOSE, CAPILLARY: Glucose-Capillary: 189 mg/dL — ABNORMAL HIGH (ref 70–99)

## 2019-01-06 MED ORDER — SODIUM CHLORIDE 0.9 % IV SOLN: 250.0000 mL | INTRAVENOUS | Status: DC | PRN

## 2019-01-06 MED ORDER — SODIUM CHLORIDE 0.9% FLUSH: 3.0000 mL | INTRAVENOUS | Status: DC | PRN

## 2019-01-06 MED ORDER — SODIUM CHLORIDE 0.9% FLUSH
3.0000 mL | Freq: Two times a day (BID) | INTRAVENOUS | Status: DC
  Administered 2019-01-06: 3 mL via INTRAVENOUS

## 2019-01-06 MED ORDER — ASPIRIN 81 MG PO CHEW
81.0000 mg | CHEWABLE_TABLET | ORAL | Status: AC
  Administered 2019-01-07: 81 mg via ORAL
  Filled 2019-01-06: qty 1

## 2019-01-06 MED ORDER — SODIUM CHLORIDE 0.9 % IV SOLN
INTRAVENOUS | Status: DC
  Administered 2019-01-07: 06:00:00 via INTRAVENOUS

## 2019-01-06 NOTE — Progress Notes (Signed)
Patient ID: Jamael Hoffmann, male   DOB: Feb 12, 1956, 63 y.o.   MRN: [id number] S: feels well, no new complaints O:BP (!) 105/59 (BP Location: Right Arm)   Pulse 76   Temp 97.6 F (36.4 C) (Oral)   Resp 18   Ht 5\' 11"  (1.803 m)   Wt 92.4 kg Comment: scale a  SpO2 98%   BMI 28.41 kg/m   Intake/Output Summary (Last 24 hours) at 01/06/2019 1149 Last data filed at 01/06/2019 1019 Gross per 24 hour  Intake 760 ml  Output 0 ml  Net 760 ml   Intake/Output: I/O last 3 completed shifts: In: 8588 [P.O.:1420] Out: 2000 [Other:2000]  Intake/Output this shift:  Total I/O In: 300 [P.O.:300] Out: -  Weight change: -0.5 kg Gen: NAD CVS: no rub Resp: cta Abd: benign Ext: 1+ edema  Recent Labs  Lab 12/31/18 0749 01/02/19 1154 01/04/19 1131  NA 137 136 135  K 4.4 4.5 4.7  CL 98 97* 98  CO2 24 25 21*  GLUCOSE 127* 143* 140*  BUN 43* 52* 65*  CREATININE 7.81* 6.84* 6.75*  ALBUMIN 3.2* 3.3* 3.5  CALCIUM 8.8* 8.6* 8.7*  PHOS 3.6 3.6 3.0   Liver Function Tests: Recent Labs  Lab 12/31/18 0749 01/02/19 1154 01/04/19 1131  ALBUMIN 3.2* 3.3* 3.5   No results for input(s): LIPASE, AMYLASE in the last 168 hours. No results for input(s): AMMONIA in the last 168 hours. CBC: Recent Labs  Lab 01/02/19 1056 01/04/19 0816 01/04/19 1131 01/05/19 0432 01/06/19 0415  WBC 9.9 10.3 10.3 11.2* 12.2*  HGB 11.6* 12.0* 11.9* 11.8* 11.8*  HCT 38.6* 39.5 38.9* 38.4* 37.4*  MCV 92.1 91.6 91.7 90.6 90.8  PLT 101* 138* 119* 141* 136*   Cardiac Enzymes: No results for input(s): CKTOTAL, CKMB, CKMBINDEX, TROPONINI in the last 168 hours. CBG: No results for input(s): GLUCAP in the last 168 hours.  Iron Studies: No results for input(s): IRON, TIBC, TRANSFERRIN, FERRITIN in the last 72 hours. Studies/Results: No results found. Marland Kitchen albuterol  2.5 mg Nebulization TID  . amiodarone  200 mg Oral Daily  . atorvastatin  40 mg Oral QHS  . Chlorhexidine Gluconate Cloth  6 each Topical Q0600  .  gabapentin  300 mg Oral QHS  . isosorbide mononitrate  30 mg Oral Daily  . metoprolol succinate  50 mg Oral Daily  . multivitamin  1 tablet Oral Daily  . pantoprazole  40 mg Oral QAC breakfast  . predniSONE  30 mg Oral Q breakfast  . sodium chloride flush  3 mL Intravenous Q12H  . umeclidinium-vilanterol  1 puff Inhalation Daily    BMET    Component Value Date/Time   NA 135 01/04/2019 1131   K 4.7 01/04/2019 1131   CL 98 01/04/2019 1131   CO2 21 (L) 01/04/2019 1131   GLUCOSE 140 (H) 01/04/2019 1131   BUN 65 (H) 01/04/2019 1131   CREATININE 6.75 (H) 01/04/2019 1131   CALCIUM 8.7 (L) 01/04/2019 1131   GFRNONAA 8 (L) 01/04/2019 1131   GFRAA 9 (L) 01/04/2019 1131   CBC    Component Value Date/Time   WBC 12.2 (H) 01/06/2019 0415   RBC 4.12 (L) 01/06/2019 0415   HGB 11.8 (L) 01/06/2019 0415   HCT 37.4 (L) 01/06/2019 0415   PLT 136 (L) 01/06/2019 0415   MCV 90.8 01/06/2019 0415   MCH 28.6 01/06/2019 0415   MCHC 31.6 01/06/2019 0415   RDW 19.4 (H) 01/06/2019 0415   LYMPHSABS 1.0 12/27/2018 0442  MONOABS 0.8 12/27/2018 0442   EOSABS 0.4 12/27/2018 0442   BASOSABS 0.0 12/27/2018 0442    Dialysis Orders: TTS at [place] 4h400/800 92.5kg 3K/2.25bath P2 LIJTDC Heparin none -Calcitriol 0.5 mcg PO TIW  Assessment/Plan: 1. R foot erythema: Hx L TMA.[facility] planning arteriogram 01/08/19. 2. Thrombocytopenia:acute on chronic issue, negative HIT.Hem/onc treating as ITP,getting course of prednisoneand platelets rising daily 3. 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 Recommend biopsy while he remains an inpatient to expedite treatment as an outpatient biopsy will likely be delayed several weeks to months 4. ESRD:Off schedule. HD today since his angiogram is not scheduled until 01/08/19 and will get back on schedule Saturday if he is still an inpatient.  5. BP/volume:BP ok, does have some LE edema. Currently below his dry wt.   Will  continue to challenge edw as tolerated.  6. Chest pain/ hx CABG- seen by Dr Debara Pickett.Concerned about the cardiac catheterization in setting of thrombocytopenia. Had recent Myoview with fixed defect. 1. No plans to proceed with cath and PCI at this time due to the thrombocytopenia, although his platelet count has improved 2. Would recommend re-consulting Cardiology now that his platelet counts have improved. 3. Continues on Imdur 30 mg daily 7. Vascular access- failed left cimino avf.  Will ask [facility] if they could evaluate for possible AVG/AVF placement while he remains an inpatient.  8. Congestive heart failure:LVEF 25% 9. Anemia of CKD:Hgb 11.1 - no ESA for now. 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 Continue home meds. 11. Hyperlipidemia continues on atorvastatin 12. GERD continues on Protonix 13. Atrial fibrillation: Not on anticoagulation. TSH 2.734  Donetta Potts, MD [employer] [phone]

## 2019-01-06 NOTE — Progress Notes (Signed)
Consent on chart for right and left heart catheretization. EKG on chart

## 2019-01-06 NOTE — Progress Notes (Signed)
Patient returned from dialysis via bed. Awake and alert. 3/L removed during dialysis session.  Pt sitting on side of bed eating dinner and voices no c/o.

## 2019-01-06 NOTE — Progress Notes (Signed)
Patient is an independent Ambulator in his room.

## 2019-01-06 NOTE — Progress Notes (Addendum)
Progress Note  Patient Name: Larry Klein Date of Encounter: 01/06/2019  Primary Cardiologist: Jenne Campus, MD   Due to the current COVID-19 pandemic and the desire to minimize use of PPE and reduce provider exposure, the patient exam was deferred or conducted via video or teleconference.  Subjective   Legs hurt quite a bit at times. Cards MD in [place] was talking to him about a device.   No chest pain  Feels DOE, even though is compliant w/ HD fluid restrictions. Wishes that could get better.  Inpatient Medications    Scheduled Meds: . albuterol  2.5 mg Nebulization TID  . amiodarone  200 mg Oral Daily  . atorvastatin  40 mg Oral QHS  . Chlorhexidine Gluconate Cloth  6 each Topical Q0600  . gabapentin  300 mg Oral QHS  . isosorbide mononitrate  30 mg Oral Daily  . metoprolol succinate  50 mg Oral Daily  . multivitamin  1 tablet Oral Daily  . pantoprazole  40 mg Oral QAC breakfast  . predniSONE  30 mg Oral Q breakfast  . sodium chloride flush  3 mL Intravenous Q12H  . umeclidinium-vilanterol  1 puff Inhalation Daily   Continuous Infusions: . sodium chloride     PRN Meds: sodium chloride, acetaminophen **OR** acetaminophen, albuterol, bismuth subsalicylate, diazepam, HYDROcodone-acetaminophen, ondansetron **OR** ondansetron (ZOFRAN) IV, sodium chloride flush   Vital Signs    Vitals:   01/05/19 1924 01/06/19 0418 01/06/19 0421 01/06/19 0846  BP: 111/75  (!) 105/59   Pulse: (!) 55  76   Resp: 18  18   Temp: 98 F (36.7 C)  97.6 F (36.4 C)   TempSrc: Oral  Oral   SpO2: 98%  98% 98%  Weight:  92.4 kg    Height:        Intake/Output Summary (Last 24 hours) at 01/06/2019 1050 Last data filed at 01/06/2019 1019 Gross per 24 hour  Intake 760 ml  Output 0 ml  Net 760 ml   Last 3 Weights 01/06/2019 01/05/2019 01/04/2019  Weight (lbs) 203 lb 11.3 oz 200 lb 3.2 oz 200 lb 2.8 oz  Weight (kg) 92.4 kg 90.81 kg 90.8 kg      Telemetry    Not on - Personally  Reviewed  ECG    04/08 - AFib, RBBB, no acute ischemic changes - Personally Reviewed  Physical Exam    General: Well developed, chronically-ill appearing, male in no acute distress Head: Eyes PERRLA, No xanthomas.   Normocephalic and atraumatic Lungs: decreased BS bases to auscultation. Heart: Irreg R&R S1 S2, without RG, soft SEM. RUE Pulse 2+, LUE w/ HD access, unable to get pulses bilat LE. No JVD. Abdomen: Bowel sounds are present, abdomen soft and non-tender without masses or  hernias noted. Msk: Weak strength and tone for age. Extremities: No clubbing, cyanosis or edema.    Skin:  No rashes or lesions noted. Neuro: Alert and oriented X 3. Psych:  Good affect, responds appropriately   Labs    Chemistry Recent Labs  Lab 12/31/18 0749 01/02/19 1154 01/04/19 1131  NA 137 136 135  K 4.4 4.5 4.7  CL 98 97* 98  CO2 24 25 21*  GLUCOSE 127* 143* 140*  BUN 43* 52* 65*  CREATININE 7.81* 6.84* 6.75*  CALCIUM 8.8* 8.6* 8.7*  ALBUMIN 3.2* 3.3* 3.5  GFRNONAA 7* 8* 8*  GFRAA 8* 9* 9*  ANIONGAP 15 14 16*     Hematology Recent Labs  Lab 01/04/19 1131 01/05/19  0814 01/06/19 0415  WBC 10.3 11.2* 12.2*  RBC 4.24 4.24 4.12*  HGB 11.9* 11.8* 11.8*  HCT 38.9* 38.4* 37.4*  MCV 91.7 90.6 90.8  MCH 28.1 27.8 28.6  MCHC 30.6 30.7 31.6  RDW 19.2* 19.3* 19.4*  PLT 119* 141* 136*     Radiology    No results found.  Cardiac Studies   12/08/2018 ECHO  1. The left ventricle has severely reduced systolic function, with an ejection fraction of 20-25%. The cavity size was moderately dilated. There is mildly increased left ventricular wall thickness. Left ventricular diastolic function could not be  evaluated secondary to atrial fibrillation. Left ventricular diffuse hypokinesis.  2. The right ventricle has mildly reduced systolic function. The cavity was mildly enlarged. There is no increase in right ventricular wall thickness.  3. Left atrial size was moderately dilated.  4.  Right atrial size was moderately dilated.  5. The mitral valve is degenerative. Moderate thickening of the mitral valve leaflet. Mitral valve regurgitation is moderate to severe by color flow Doppler. The MR jet is anteriorly-directed.  6. The tricuspid valve is grossly normal.  7. The aortic valve was not well visualized Moderate sclerosis of the aortic valve.  8. The inferior vena cava was dilated in size with <50% respiratory variability.  NUCLEAR STRESS 12/10/2018  No T wave inversion was noted during stress.  Defect 1: There is a medium defect of moderate severity present in the basal inferior and basal inferolateral location.  Defect 2: There is a small defect of moderate severity present in the apex location.  The left ventricular ejection fraction is severely decreased (<30%).  Findings consistent with ischemia and prior myocardial infarction.  This is a high risk study.   High risk pharmacological nuclear perfusion study due to severely depressed left ventricular systolic function. Perfusion images suggest a moderate-size inferobasal scar and a small area of apical ischemia, which is largely reversible. The degree of ventricular dysfunction is out of proportion to the area of the perfusion abnormalities, suggesting mixed etiology for the cardiomyopathy.  Patient Profile     63 y.o. male with ESRD on HD, mixed ischemic and non-ischemic CM with LVEF 20-25%,history of CABG x 4 in [place], [place] in 2017,persistent afib with RVR, chronic thrombocytopenia of unknown etiology, COPD and anemia.  Admitted 03/23-03/29 w/ CP>>MV and echo done at that time. Low EF ?2nd tachycardia, recheck echo 3 mos. No ischemic eval planned.  Presented 12/24/2018 from [facility] with chest pain and dyspnea, elevated troponin, afib with RVR and possible COVID-19>>negative.   Last seen 04/11, MV was high-risk, but not felt cath candidate, Imdur added. Afib rate ok, no anticoag 2nd low  plt. Cards asked to see again 04/22 to see if ASA can be restarted.   Renal for HD, [facility] for LE vascular dz and mass, possible sarcoma, for LE angio 04/24. Hematology seeing for low plt, felt chronic ITP>>prednisone  Assessment & Plan    1. CAD/unstable angina:  - trop peak 0.26 on 04/08, c/w previous values 11/2018 - previously not cath candidate due to low plt (hx CABG x 4 in 2017, grafts unknown). - review data with MD now that plt are better - continue Toprol XL 25 mg qd, Imdur 30 mg qd, Lipitor 40 mg qd - do not feel BP high enough to increase doses - if HemOnc feels plt count can be maintained, restart ASA.  2. CHF (chronic systolic):  - volume mgt per HD - continue meds as above -  recheck echo in 3 months - Cards MD in [place] had discussed him getting a device in 2019, EF may have been low for > 6 months - BP limits med titration  3. AFib:  - basically permanent - rate control w/ amio, BB - CHA2DS2-VASc = 3 (CHF, CAD, HTN) - Prev no anticoag due to low plt  For questions or updates, please contact [place] Please consult [email or web address] for contact info under     Signed, Rosaria Ferries, PA-C  01/06/2019, 10:50 AM   Patient seen and examined and history reviewed. Agree with above findings and plan. 63 yo WM with complex medical history. Multiple hospitalizations this year for various problems. He has ESRD on dialysis, PAD, left leg mass, ITP, CAD s/p CABG in 2017 and chronic systolic CHF. Recently treated for ITP with steroids with improvement in platelet count. Planned LE arteriogram later his week due to severe right foot pain. 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 At time of CABG EF was 40%. Now down to 20-25%. He does describe significant chest tightness and DOE class 3. He has chronic Afib- now rate controlled. Not a candidate for anticoagulation due to ITP Previously invasive cardiac evaluation deferred due to low platelets. Now potentially needing PV  intervention and may require surgery for left leg mass. Platelet count improved.  Given need to further evaluate his cardiac symptoms, cause of cardiomyopathy, and for potential cardiac clearance I think we should proceed with right and left heart cath with coronary and graft angiography. He is going to be dialysed this afternoon so will schedule for tomorrow. Will attempt to get op note from prior bypass. Will need to clarify with hematology if patient would be a candidate for DAPT if a stent is needed.   Aalijah Lanphere [place], [place] 01/06/2019 1:51 PM

## 2019-01-06 NOTE — Progress Notes (Addendum)
IP PROGRESS NOTE  Subjective: Larry Klein has no new complaints.  Continues prednisone.  He reports no thrush.   Objective: Vital signs in last 24 hours: Blood pressure (!) 105/59, pulse 76, temperature 97.6 F (36.4 C), temperature source Oral, resp. rate 18, height 5\' 11"  (1.803 m), weight 203 lb 11.3 oz (92.4 kg), SpO2 98 %.  Intake/Output from previous day: 04/21 0701 - 04/22 0700 In: 940 [P.O.:940] Out: 0   Physical Exam: HEENT: No thrush Extremities: There is masslike fullness in the left lower leg, erythema at the right and left foot.  Trace pitting edema at the low leg bilaterally  Lab Results: Recent Labs    01/05/19 0432 01/06/19 0415  WBC 11.2* 12.2*  HGB 11.8* 11.8*  HCT 38.4* 37.4*  PLT 141* 136*    BMET Recent Labs    01/04/19 1131  NA 135  K 4.7  CL 98  CO2 21*  GLUCOSE 140*  BUN 65*  CREATININE 6.75*  CALCIUM 8.7*    No results found for: CEA1  Studies/Results: No results found.  Medications: I have reviewed the patient's current medications.  Assessment/Plan:  1.  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  Renal failure-on hemodialysis 3.  Coronary artery disease 4.  COPD 5.  Peripheral vascular disease 6.  Anemia 7.  CHF 8.  Left calf mass on CTA 12/28/2018, ultrasound 12/29/2018- solid mass without vascularity 9.  Elevated liver enzymes   The platelet count remained stable at 136,000 this morning. Vascular surgery planning to proceed with an angiogram this week.  Currently scheduled for Friday 01/08/2019.  Hospitalist plans to speak with cardiology regarding antiplatelet therapy.  We can consider a course of pulse Decadron therapy with the hope of obtaining a longer remission from the ITP.  The MRI of the left lower extremity is suggestive of a malignant process.  Results have previously been discussed with the patient.  We will make an orthopedic oncology referral as an  outpatient.   Recommendations:  1.  Continue prednisone.  Dose has been reduced to 30 mg daily starting today. 2.  CBC 01/08/2019 3.  Outpatient follow-up at the [facility] and with orthopedic oncology will be arranged    LOS: 13 days   Larry Bussing, NP   01/06/2019, 7:58 AM  Larry Klein appears unchanged.  I recommend continuing prednisone for now.  We are waiting on input from cardiology regarding anticoagulation therapy in the setting of coronary artery disease and atrial fibrillation.  He should be a candidate for anticoagulation with the current platelet count. The thrombocytopenia has responded to prednisone.  There is a chance he could obtain a lasting remission with pulse Decadron or rituximab.  This can be started as an outpatient.

## 2019-01-06 NOTE — Progress Notes (Signed)
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 fib with RVR, chronic thrombocytopenia, anemia, COPD, HTN, admitted on 12/23/2018 with complaints of chest pain and dyspnea on exertion, initially presented to [facility] ED where CTA chest was negative for PE, transferred to [facility] for cardiology evaluation.  Cardiology evaluated for unstable angina, was on heparin drip, felt not a candidate for surgical or percutaneous revascularization (prior CABG, thrombocytopenia), added Imdur and signed off.  COVID testing negative.  Nephrology consulting for HD needs.Vascular surgery consulted for right foot pain and plan arteriogram 4/21 pending further improvement in thrombocytopenia.  Hematology consulted for thrombocytopenia.   Assessment & Plan:   Principal Problem:   Chest pain Active Problems:   Dyspnea   ESRD (end stage renal disease) on dialysis (HCC)   Atrial fibrillation with RVR (HCC)   Acute on chronic systolic heart failure (HCC)   Cardiomyopathy (HCC)   PVD (peripheral vascular disease) ([place])   Coronary artery disease with unstable angina Cardiology consulted and recommended not a candidate for surgical or percutaneous revascularization.  Initially patient had low platelets and antiplatelet therapy was not indicated at that time.   Thrombocytopenia is resolved, requested cardiology to see if he can be restarted on anti platelet agents and to see if he is a candidate for PCI or defibrillator .  Currently he denies any chest pain, sob.    Acute on chronic systolic heart failure Continue with volume management with hemodialysis Continue with beta-blocker,.    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 Hematology consulted recommended pulse  Decadron therapy and outpatient follow-up.  Platelets have improved dramatically.  Currently on prednisone 30 mg daily.    Persistent atrial fibrillation Rate controlled with amiodarone and Toprol at this time, patient is not on anticoagulation due to thrombocytopenia.   Mild transaminitis due to CHF Patient denies any nausea vomiting or abdominal pain at this time.  Acute hepatitis panel negative. Repeat liver enzymes in am.     End-stage renal disease on hemodialysis Nephrology on board.   Lower extremity swelling status post left TMA and right leg swelling Probably from peripheral vascular disease.  No DVT Venous duplex.  Vascular surgery consulted and plan for arteriogram on Friday.   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  He will need referral to Orthopedic oncology at [place] for further evaluation.    COPD No wheezing heard continue with nebs as needed.  DVT prophylaxis: none. scd's couldn't be applied due to leg tenderness and swelling.  Code Status: full code.  Family Communication: none at bedside.  Disposition Plan: pending further work up.   Consultants:  Dr Oneida Alar with vascular surgery.  Nephrology with Dr Marval Regal.  Hematology Dr Benay Spice.    Procedures:  Arteriogram scheduled for Friday.   Antimicrobials:none.   Subjective: No chest pain or sob. No nausea, vomiting.  Persistent leg pain.   Objective: Vitals:   01/05/19 1924 01/06/19 0418 01/06/19 0421 01/06/19 0846  BP: 111/75  (!) 105/59   Pulse: (!) 55  76   Resp: 18  18   Temp: 98 F (36.7 C)  97.6 F (36.4 C)   TempSrc: Oral  Oral   SpO2:  98%  98% 98%  Weight:  92.4 kg    Height:        Intake/Output Summary (Last 24 hours) at 01/06/2019 1234 Last data filed at 01/06/2019 1019 Gross per 24 hour  Intake 520 ml  Output 0 ml  Net 520 ml   Filed Weights   01/04/19 1905  01/05/19 0025 01/06/19 0418  Weight: 90.8 kg 90.8 kg 92.4 kg    Examination:  General exam: not in distress , calm and comfortable.  Respiratory system: diminished at bases, no wheezing or rhonchi.  Cardiovascular system: S1 & S2 heard, irregular.  Gastrointestinal system: Abdomen is nondistended, soft and nontender. . Normal bowel sounds heard. Central nervous system: Alert and oriented. No focal neurological deficits. Extremities: bilateral lower extremity swelling and tenderness present. Left metatarsal amputation.  Skin: No rashes, lesions or ulcers Psychiatry: calm today.     Data Reviewed: I have personally reviewed following labs and imaging studies  CBC: Recent Labs  Lab 01/02/19 1056 01/04/19 0816 01/04/19 1131 01/05/19 0432 01/06/19 0415  WBC 9.9 10.3 10.3 11.2* 12.2*  HGB 11.6* 12.0* 11.9* 11.8* 11.8*  HCT 38.6* 39.5 38.9* 38.4* 37.4*  MCV 92.1 91.6 91.7 90.6 90.8  PLT 101* 138* 119* 141* 867*   Basic Metabolic Panel: Recent Labs  Lab 12/31/18 0749 01/02/19 1154 01/04/19 1131  NA 137 136 135  K 4.4 4.5 4.7  CL 98 97* 98  CO2 24 25 21*  GLUCOSE 127* 143* 140*  BUN 43* 52* 65*  CREATININE 7.81* 6.84* 6.75*  CALCIUM 8.8* 8.6* 8.7*  PHOS 3.6 3.6 3.0   GFR: Estimated Creatinine Clearance: 12.8 mL/min (A) (by C-G formula based on SCr of 6.75 mg/dL (H)). Liver Function Tests: Recent Labs  Lab 12/31/18 0749 01/02/19 1154 01/04/19 1131  ALBUMIN 3.2* 3.3* 3.5   No results for input(s): LIPASE, AMYLASE in the last 168 hours. No results for input(s): AMMONIA in the last 168 hours. Coagulation Profile: No results for input(s): INR, PROTIME in the last 168 hours. Cardiac Enzymes: No results for input(s): CKTOTAL, CKMB, CKMBINDEX, TROPONINI in the last 168 hours. BNP (last 3 results) No results for input(s): PROBNP in the last 8760 hours. HbA1C: No results for input(s): HGBA1C in the last 72 hours. CBG: No results for input(s): GLUCAP in the last 168  hours. Lipid Profile: No results for input(s): CHOL, HDL, LDLCALC, TRIG, CHOLHDL, LDLDIRECT in the last 72 hours. Thyroid Function Tests: No results for input(s): TSH, T4TOTAL, FREET4, T3FREE, THYROIDAB in the last 72 hours. Anemia Panel: No results for input(s): VITAMINB12, FOLATE, FERRITIN, TIBC, IRON, RETICCTPCT in the last 72 hours. Sepsis Labs: No results for input(s): PROCALCITON, LATICACIDVEN in the last 168 hours.  No results found for this or any previous visit (from the past 240 hour(s)).       Radiology Studies: No results found.      Scheduled Meds: . albuterol  2.5 mg Nebulization TID  . amiodarone  200 mg Oral Daily  . atorvastatin  40 mg Oral QHS  . Chlorhexidine Gluconate Cloth  6 each Topical Q0600  . gabapentin  300 mg Oral QHS  . isosorbide mononitrate  30 mg Oral Daily  . metoprolol succinate  50 mg Oral Daily  . multivitamin  1 tablet Oral Daily  . pantoprazole  40 mg Oral QAC breakfast  . predniSONE  30 mg Oral Q breakfast  . sodium chloride flush  3 mL Intravenous Q12H  . umeclidinium-vilanterol  1 puff Inhalation Daily  Continuous Infusions: . sodium chloride       LOS: 13 days    Time spent: 36 minutes.     Hosie Poisson, MD [facility] Pager [phone]  If 7PM-7AM, please contact night-coverage [email or web address] Password [facility] 01/06/2019, 12:34 PM

## 2019-01-06 NOTE — H&P (View-Only) (Signed)
Progress Note  Patient Name: Larry Klein Date of Encounter: 01/06/2019  Primary Cardiologist: Jenne Campus, MD   Due to the current COVID-19 pandemic and the desire to minimize use of PPE and reduce provider exposure, the patient exam was deferred or conducted via video or teleconference.  Subjective   Legs hurt quite a bit at times. Cards MD in [place] was talking to him about a device.   No chest pain  Feels DOE, even though is compliant w/ HD fluid restrictions. Wishes that could get better.  Inpatient Medications    Scheduled Meds: . albuterol  2.5 mg Nebulization TID  . amiodarone  200 mg Oral Daily  . atorvastatin  40 mg Oral QHS  . Chlorhexidine Gluconate Cloth  6 each Topical Q0600  . gabapentin  300 mg Oral QHS  . isosorbide mononitrate  30 mg Oral Daily  . metoprolol succinate  50 mg Oral Daily  . multivitamin  1 tablet Oral Daily  . pantoprazole  40 mg Oral QAC breakfast  . predniSONE  30 mg Oral Q breakfast  . sodium chloride flush  3 mL Intravenous Q12H  . umeclidinium-vilanterol  1 puff Inhalation Daily   Continuous Infusions: . sodium chloride     PRN Meds: sodium chloride, acetaminophen **OR** acetaminophen, albuterol, bismuth subsalicylate, diazepam, HYDROcodone-acetaminophen, ondansetron **OR** ondansetron (ZOFRAN) IV, sodium chloride flush   Vital Signs    Vitals:   01/05/19 1924 01/06/19 0418 01/06/19 0421 01/06/19 0846  BP: 111/75  (!) 105/59   Pulse: (!) 55  76   Resp: 18  18   Temp: 98 F (36.7 C)  97.6 F (36.4 C)   TempSrc: Oral  Oral   SpO2: 98%  98% 98%  Weight:  92.4 kg    Height:        Intake/Output Summary (Last 24 hours) at 01/06/2019 1050 Last data filed at 01/06/2019 1019 Gross per 24 hour  Intake 760 ml  Output 0 ml  Net 760 ml   Last 3 Weights 01/06/2019 01/05/2019 01/04/2019  Weight (lbs) 203 lb 11.3 oz 200 lb 3.2 oz 200 lb 2.8 oz  Weight (kg) 92.4 kg 90.81 kg 90.8 kg      Telemetry    Not on - Personally  Reviewed  ECG    04/08 - AFib, RBBB, no acute ischemic changes - Personally Reviewed  Physical Exam    General: Well developed, chronically-ill appearing, male in no acute distress Head: Eyes PERRLA, No xanthomas.   Normocephalic and atraumatic Lungs: decreased BS bases to auscultation. Heart: Irreg R&R S1 S2, without RG, soft SEM. RUE Pulse 2+, LUE w/ HD access, unable to get pulses bilat LE. No JVD. Abdomen: Bowel sounds are present, abdomen soft and non-tender without masses or  hernias noted. Msk: Weak strength and tone for age. Extremities: No clubbing, cyanosis or edema.    Skin:  No rashes or lesions noted. Neuro: Alert and oriented X 3. Psych:  Good affect, responds appropriately   Labs    Chemistry Recent Labs  Lab 12/31/18 0749 01/02/19 1154 01/04/19 1131  NA 137 136 135  K 4.4 4.5 4.7  CL 98 97* 98  CO2 24 25 21*  GLUCOSE 127* 143* 140*  BUN 43* 52* 65*  CREATININE 7.81* 6.84* 6.75*  CALCIUM 8.8* 8.6* 8.7*  ALBUMIN 3.2* 3.3* 3.5  GFRNONAA 7* 8* 8*  GFRAA 8* 9* 9*  ANIONGAP 15 14 16*     Hematology Recent Labs  Lab 01/04/19 1131 01/05/19  0454 01/06/19 0415  WBC 10.3 11.2* 12.2*  RBC 4.24 4.24 4.12*  HGB 11.9* 11.8* 11.8*  HCT 38.9* 38.4* 37.4*  MCV 91.7 90.6 90.8  MCH 28.1 27.8 28.6  MCHC 30.6 30.7 31.6  RDW 19.2* 19.3* 19.4*  PLT 119* 141* 136*     Radiology    No results found.  Cardiac Studies   12/08/2018 ECHO  1. The left ventricle has severely reduced systolic function, with an ejection fraction of 20-25%. The cavity size was moderately dilated. There is mildly increased left ventricular wall thickness. Left ventricular diastolic function could not be  evaluated secondary to atrial fibrillation. Left ventricular diffuse hypokinesis.  2. The right ventricle has mildly reduced systolic function. The cavity was mildly enlarged. There is no increase in right ventricular wall thickness.  3. Left atrial size was moderately dilated.  4.  Right atrial size was moderately dilated.  5. The mitral valve is degenerative. Moderate thickening of the mitral valve leaflet. Mitral valve regurgitation is moderate to severe by color flow Doppler. The MR jet is anteriorly-directed.  6. The tricuspid valve is grossly normal.  7. The aortic valve was not well visualized Moderate sclerosis of the aortic valve.  8. The inferior vena cava was dilated in size with <50% respiratory variability.  NUCLEAR STRESS 12/10/2018  No T wave inversion was noted during stress.  Defect 1: There is a medium defect of moderate severity present in the basal inferior and basal inferolateral location.  Defect 2: There is a small defect of moderate severity present in the apex location.  The left ventricular ejection fraction is severely decreased (<30%).  Findings consistent with ischemia and prior myocardial infarction.  This is a high risk study.   High risk pharmacological nuclear perfusion study due to severely depressed left ventricular systolic function. Perfusion images suggest a moderate-size inferobasal scar and a small area of apical ischemia, which is largely reversible. The degree of ventricular dysfunction is out of proportion to the area of the perfusion abnormalities, suggesting mixed etiology for the cardiomyopathy.  Patient Profile     63 y.o. male with ESRD on HD, mixed ischemic and non-ischemic CM with LVEF 20-25%,history of CABG x 4 in [place], [place] in 2017,persistent afib with RVR, chronic thrombocytopenia of unknown etiology, COPD and anemia.  Admitted 03/23-03/29 w/ CP>>MV and echo done at that time. Low EF ?2nd tachycardia, recheck echo 3 mos. No ischemic eval planned.  [facility]/24/[facility] with chest pain and dyspnea, elevated troponin, afib with RVR and possible COVID-19>>negative.   Last seen 04/11, MV was high-risk, but not felt cath candidate, Imdur added. Afib rate ok, no anticoag 2nd low  plt. Cards asked to see again 04/22 to see if ASA can be restarted.   Renal for HD, VVS for LE vascular dz and mass, possible sarcoma, for LE angio 04/24. Hematology seeing for low plt, felt chronic ITP>>prednisone  Assessment & Plan    1. CAD/unstable angina:  - trop peak 0.26 on 04/08, c/w previous values 11/2018 - previously not cath candidate due to low plt (hx CABG x 4 in 2017, grafts unknown). - review data with MD now that plt are better - continue Toprol XL 25 mg qd, Imdur 30 mg qd, Lipitor 40 mg qd - do not feel BP high enough to increase doses - if HemOnc feels plt count can be maintained, restart ASA.  2. CHF (chronic systolic):  - volume mgt per HD - continue meds as above -  recheck echo in 3 months - Cards MD in [place] had discussed him getting a device in 2019, EF may have been low for > 6 months - BP limits med titration  3. AFib:  - basically permanent - rate control w/ amio, BB - CHA2DS2-VASc = 3 (CHF, CAD, HTN) - Prev no anticoag due to low plt  For questions or updates, please contact [place] Please consult [email or web address] for contact info under     Signed, Rosaria Ferries, PA-C  01/06/2019, 10:50 AM   Patient seen and examined and history reviewed. Agree with above findings and plan. 63 yo WM with complex medical history. Multiple hospitalizations this year for various problems. He has ESRD on dialysis, PAD, left leg mass, ITP, CAD s/p CABG in 2017 and chronic systolic CHF. Recently treated for ITP with steroids with improvement in platelet count. Planned LE arteriogram later his week due to severe right foot pain. 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 At time of CABG EF was 40%. Now down to 20-25%. He does describe significant chest tightness and DOE class 3. He has chronic Afib- now rate controlled. Not a candidate for anticoagulation due to ITP Previously invasive cardiac evaluation deferred due to low platelets. Now potentially needing PV  intervention and may require surgery for left leg mass. Platelet count improved.  Given need to further evaluate his cardiac symptoms, cause of cardiomyopathy, and for potential cardiac clearance I think we should proceed with right and left heart cath with coronary and graft angiography. He is going to be dialysed this afternoon so will schedule for tomorrow. Will attempt to get op note from prior bypass. Will need to clarify with hematology if patient would be a candidate for DAPT if a stent is needed.   Jolin Benavides [place], [place] 01/06/2019 1:51 PM

## 2019-01-07 ENCOUNTER — Encounter (HOSPITAL_COMMUNITY)
Admission: EM | Disposition: A | Discharge status: Home / Self Care | Payer: Self-pay | Source: Other Acute Inpatient Hospital | Attending: Internal Medicine

## 2019-01-07 ENCOUNTER — Encounter (HOSPITAL_COMMUNITY): Payer: Self-pay | Admitting: Cardiovascular Disease

## 2019-01-07 ENCOUNTER — Inpatient Hospital Stay: Payer: Self-pay | Admitting: Critical Care Medicine

## 2019-01-07 DIAGNOSIS — I2510 Atherosclerotic heart disease of native coronary artery without angina pectoris: Secondary | ICD-10-CM

## 2019-01-07 HISTORY — PX: RIGHT/LEFT HEART CATH AND CORONARY/GRAFT ANGIOGRAPHY: CATH118267

## 2019-01-07 LAB — CBC
HCT: 39 % (ref 39.0–52.0)
Hemoglobin: 12.2 g/dL — ABNORMAL LOW (ref 13.0–17.0)
MCH: 28.7 pg (ref 26.0–34.0)
MCHC: 31.3 g/dL (ref 30.0–36.0)
MCV: 91.8 fL (ref 80.0–100.0)
Platelets: 127 10*3/uL — ABNORMAL LOW (ref 150–400)
RBC: 4.25 MIL/uL (ref 4.22–5.81)
RDW: 19.6 % — ABNORMAL HIGH (ref 11.5–15.5)
WBC: 14.4 10*3/uL — ABNORMAL HIGH (ref 4.0–10.5)
nRBC: 0 % (ref 0.0–0.2)

## 2019-01-07 LAB — COMPREHENSIVE METABOLIC PANEL
ALT: 64 U/L — ABNORMAL HIGH (ref 0–44)
AST: 32 U/L (ref 15–41)
Albumin: 3.3 g/dL — ABNORMAL LOW (ref 3.5–5.0)
Alkaline Phosphatase: 117 U/L (ref 38–126)
Anion gap: 14 (ref 5–15)
BUN: 42 mg/dL — ABNORMAL HIGH (ref 8–23)
CO2: 24 mmol/L (ref 22–32)
Calcium: 8.3 mg/dL — ABNORMAL LOW (ref 8.9–10.3)
Chloride: 102 mmol/L (ref 98–111)
Creatinine, Ser: 5.35 mg/dL — ABNORMAL HIGH (ref 0.61–1.24)
GFR calc Af Amer: 12 mL/min — ABNORMAL LOW (ref >60)
GFR calc non Af Amer: 10 mL/min — ABNORMAL LOW (ref >60)
Glucose, Bld: 130 mg/dL — ABNORMAL HIGH (ref 70–99)
Potassium: 3.9 mmol/L (ref 3.5–5.1)
Sodium: 140 mmol/L (ref 135–145)
Total Bilirubin: 0.8 mg/dL (ref 0.3–1.2)
Total Protein: 5.7 g/dL — ABNORMAL LOW (ref 6.5–8.1)

## 2019-01-07 LAB — POCT I-STAT EG7
Acid-Base Excess: 1 mmol/L (ref 0.0–2.0)
Bicarbonate: 28.1 mmol/L — ABNORMAL HIGH (ref 20.0–28.0)
Calcium, Ion: 1.13 mmol/L — ABNORMAL LOW (ref 1.15–1.40)
HCT: 40 % (ref 39.0–52.0)
Hemoglobin: 13.6 g/dL (ref 13.0–17.0)
O2 Saturation: 47 %
Potassium: 4 mmol/L (ref 3.5–5.1)
Sodium: 138 mmol/L (ref 135–145)
TCO2: 30 mmol/L (ref 22–32)
pCO2, Ven: 52.9 mmHg (ref 44.0–60.0)
pH, Ven: 7.334 (ref 7.250–7.430)
pO2, Ven: 28 mmHg — CL (ref 32.0–45.0)

## 2019-01-07 LAB — POCT I-STAT 7, (LYTES, BLD GAS, ICA,H+H)
Acid-Base Excess: 1 mmol/L (ref 0.0–2.0)
Bicarbonate: 26.1 mmol/L (ref 20.0–28.0)
Calcium, Ion: 1.11 mmol/L — ABNORMAL LOW (ref 1.15–1.40)
HCT: 40 % (ref 39.0–52.0)
Hemoglobin: 13.6 g/dL (ref 13.0–17.0)
O2 Saturation: 99 %
Potassium: 4 mmol/L (ref 3.5–5.1)
Sodium: 138 mmol/L (ref 135–145)
TCO2: 27 mmol/L (ref 22–32)
pCO2 arterial: 43 mmHg (ref 32.0–48.0)
pH, Arterial: 7.391 (ref 7.350–7.450)
pO2, Arterial: 120 mmHg — ABNORMAL HIGH (ref 83.0–108.0)

## 2019-01-07 SURGERY — RIGHT/LEFT HEART CATH AND CORONARY/GRAFT ANGIOGRAPHY
Anesthesia: LOCAL

## 2019-01-07 MED ORDER — MORPHINE SULFATE (PF) 10 MG/ML IV SOLN: 2.0000 mg | INTRAVENOUS | Status: DC | PRN

## 2019-01-07 MED ORDER — HEPARIN (PORCINE) IN NACL 1000-0.9 UT/500ML-% IV SOLN
INTRAVENOUS | Status: AC
  Filled 2019-01-07: qty 1000

## 2019-01-07 MED ORDER — LIDOCAINE HCL (PF) 1 % IJ SOLN
INTRAMUSCULAR | Status: AC
  Filled 2019-01-07: qty 30

## 2019-01-07 MED ORDER — LIDOCAINE HCL (PF) 1 % IJ SOLN
INTRAMUSCULAR | Status: DC | PRN
  Administered 2019-01-07: 25 mL

## 2019-01-07 MED ORDER — HYDRALAZINE HCL 20 MG/ML IJ SOLN: 10.0000 mg | INTRAMUSCULAR | Status: AC | PRN

## 2019-01-07 MED ORDER — ONDANSETRON HCL 4 MG/2ML IJ SOLN: 4.0000 mg | Freq: Four times a day (QID) | INTRAMUSCULAR | Status: DC | PRN

## 2019-01-07 MED ORDER — SODIUM CHLORIDE 0.9 % IV SOLN: INTRAVENOUS | Status: AC

## 2019-01-07 MED ORDER — MORPHINE SULFATE (PF) 2 MG/ML IV SOLN
2.0000 mg | INTRAVENOUS | Status: DC | PRN
  Filled 2019-01-07: qty 1

## 2019-01-07 MED ORDER — ATORVASTATIN CALCIUM 80 MG PO TABS
80.0000 mg | ORAL_TABLET | Freq: Every day | ORAL | Status: DC
  Administered 2019-01-07 – 2019-01-20 (×11): 80 mg via ORAL
  Filled 2019-01-07 (×15): qty 1

## 2019-01-07 MED ORDER — SODIUM CHLORIDE 0.9% FLUSH
3.0000 mL | Freq: Two times a day (BID) | INTRAVENOUS | Status: DC
  Administered 2019-01-07 – 2019-01-20 (×12): 3 mL via INTRAVENOUS

## 2019-01-07 MED ORDER — HEPARIN (PORCINE) IN NACL 1000-0.9 UT/500ML-% IV SOLN
INTRAVENOUS | Status: AC
  Filled 2019-01-07: qty 500

## 2019-01-07 MED ORDER — SODIUM CHLORIDE 0.9% FLUSH: 3.0000 mL | INTRAVENOUS | Status: DC | PRN

## 2019-01-07 MED ORDER — SODIUM CHLORIDE 0.9 % IV SOLN: 250.0000 mL | INTRAVENOUS | Status: DC | PRN

## 2019-01-07 MED ORDER — HEPARIN (PORCINE) IN NACL 1000-0.9 UT/500ML-% IV SOLN
INTRAVENOUS | Status: DC | PRN
  Administered 2019-01-07 (×3): 500 mL

## 2019-01-07 MED ORDER — IOHEXOL 350 MG/ML SOLN
INTRAVENOUS | Status: DC | PRN
  Administered 2019-01-07: 105 mL via INTRAVENOUS

## 2019-01-07 MED ORDER — ASPIRIN 81 MG PO CHEW
81.0000 mg | CHEWABLE_TABLET | Freq: Every day | ORAL | Status: DC
  Administered 2019-01-08 – 2019-01-21 (×13): 81 mg via ORAL
  Filled 2019-01-07 (×14): qty 1

## 2019-01-07 MED ORDER — ACETAMINOPHEN 325 MG PO TABS: 650.0000 mg | ORAL_TABLET | ORAL | Status: DC | PRN

## 2019-01-07 MED ORDER — LABETALOL HCL 5 MG/ML IV SOLN: 10.0000 mg | INTRAVENOUS | Status: AC | PRN

## 2019-01-07 SURGICAL SUPPLY — 11 items
CATH INFINITI 5FR MULTPACK ANG (CATHETERS) ×1 IMPLANT
CATH SWAN GANZ 7F STRAIGHT (CATHETERS) ×1 IMPLANT
CLOSURE MYNX CONTROL 5F (Vascular Products) ×1 IMPLANT
KIT HEART LEFT (KITS) ×2 IMPLANT
PACK CARDIAC CATHETERIZATION (CUSTOM PROCEDURE TRAY) ×2 IMPLANT
SHEATH PINNACLE 5F 10CM (SHEATH) ×1 IMPLANT
SHEATH PINNACLE 7F 10CM (SHEATH) ×1 IMPLANT
TRANSDUCER W/STOPCOCK (MISCELLANEOUS) ×2 IMPLANT
WIRE EMERALD 3MM-J .025X260CM (WIRE) ×1 IMPLANT
WIRE EMERALD 3MM-J .035X150CM (WIRE) ×1 IMPLANT
WIRE EMERALD 3MM-J .035X260CM (WIRE) ×1 IMPLANT

## 2019-01-07 NOTE — Progress Notes (Signed)
Progress Note  Patient Name: Larry Klein Date of Encounter: 01/07/2019  Primary Cardiologist: Jenne Campus, MD   Subjective   Feels well this am. Tolerated dialysis well last pm. States they removed 3 liters.   Inpatient Medications    Scheduled Meds: . albuterol  2.5 mg Nebulization TID  . amiodarone  200 mg Oral Daily  . [START ON 01/08/2019] aspirin  81 mg Oral Daily  . atorvastatin  80 mg Oral q1800  . Chlorhexidine Gluconate Cloth  6 each Topical Q0600  . gabapentin  300 mg Oral QHS  . isosorbide mononitrate  30 mg Oral Daily  . metoprolol succinate  50 mg Oral Daily  . multivitamin  1 tablet Oral Daily  . pantoprazole  40 mg Oral QAC breakfast  . predniSONE  30 mg Oral Q breakfast  . sodium chloride flush  3 mL Intravenous Q12H  . sodium chloride flush  3 mL Intravenous Q12H  . umeclidinium-vilanterol  1 puff Inhalation Daily   Continuous Infusions: . sodium chloride    . sodium chloride    . sodium chloride     PRN Meds: sodium chloride, sodium chloride, acetaminophen **OR** acetaminophen, acetaminophen, albuterol, bismuth subsalicylate, diazepam, hydrALAZINE, HYDROcodone-acetaminophen, labetalol, morphine injection, ondansetron **OR** ondansetron (ZOFRAN) IV, sodium chloride flush, sodium chloride flush   Vital Signs    Vitals:   01/07/19 0920 01/07/19 0925 01/07/19 0930 01/07/19 0935  BP: (!) 123/92 (!) 124/91 (!) 131/92 (!) 142/84  Pulse: (!) 45 60 (!) 123 73  Resp: 14 (!) 22 18 19   Temp:      TempSrc:      SpO2: 100% 100% 99% 100%  Weight:      Height:        Intake/Output Summary (Last 24 hours) at 01/07/2019 1048 Last data filed at 01/07/2019 5929 Gross per 24 hour  Intake 330 ml  Output 3000 ml  Net -2670 ml   Last 3 Weights 01/07/2019 01/06/2019 01/06/2019  Weight (lbs) 198 lb 12.8 oz 204 lb 5.9 oz 197 lb 1.5 oz  Weight (kg) 90.175 kg 92.7 kg 89.4 kg      Telemetry    AFib with controlled rate- Personally Reviewed  ECG    None  today -  Physical Exam   General: Well developed, chronically-ill appearing, male in no acute distress Head: Eyes PERRLA, No xanthomas.   Normocephalic and atraumatic Lungs: decreased BS bases to auscultation. Heart: Irreg R&R S1 S2, without RG, soft SEM. RUE Pulse 2+, LUE w/ HD access, unable to get pulses bilat LE. No JVD. Abdomen: Bowel sounds are present, abdomen soft and non-tender without masses or  hernias noted. Msk: Weak strength and tone for age. Mass left lower leg/calf. S/p metatarsal amputation on left.  Extremities: No clubbing, cyanosis or edema.    Skin:  No rashes or lesions noted. Neuro: Alert and oriented X 3. Psych:  Good affect, responds appropriately   Labs    Chemistry Recent Labs  Lab 01/04/19 1131 01/06/19 1239 01/07/19 0315  NA 135 137 140  K 4.7 3.7 3.9  CL 98 98 102  CO2 21* 23 24  GLUCOSE 140* 149* 130*  BUN 65* 74* 42*  CREATININE 6.75* 6.74* 5.35*  CALCIUM 8.7* 8.4* 8.3*  PROT  --   --  5.7*  ALBUMIN 3.5 3.3* 3.3*  AST  --   --  32  ALT  --   --  64*  ALKPHOS  --   --  117  BILITOT  --   --  0.8  GFRNONAA 8* 8* 10*  GFRAA 9* 9* 12*  ANIONGAP 16* 16* 14     Hematology Recent Labs  Lab 01/05/19 0432 01/06/19 0415 01/07/19 0315  WBC 11.2* 12.2* 14.4*  RBC 4.24 4.12* 4.25  HGB 11.8* 11.8* 12.2*  HCT 38.4* 37.4* 39.0  MCV 90.6 90.8 91.8  MCH 27.8 28.6 28.7  MCHC 30.7 31.6 31.3  RDW 19.3* 19.4* 19.6*  PLT 141* 136* 127*    Cardiac EnzymesNo results for input(s): TROPONINI in the last 168 hours. No results for input(s): TROPIPOC in the last 168 hours.   BNPNo results for input(s): BNP, PROBNP in the last 168 hours.   DDimer No results for input(s): DDIMER in the last 168 hours.   Radiology    No results found.  Cardiac Studies   12/08/2018 ECHO 1. The left ventricle has severely reduced systolic function, with an ejection fraction of 20-25%. The cavity size was moderately dilated. There is mildly increased left  ventricular wall thickness. Left ventricular diastolic function could not be  evaluated secondary to atrial fibrillation. Left ventricular diffuse hypokinesis. 2. The right ventricle has mildly reduced systolic function. The cavity was mildly enlarged. There is no increase in right ventricular wall thickness. 3. Left atrial size was moderately dilated. 4. Right atrial size was moderately dilated. 5. The mitral valve is degenerative. Moderate thickening of the mitral valve leaflet. Mitral valve regurgitation is moderate to severe by color flow Doppler. The MR jet is anteriorly-directed. 6. The tricuspid valve is grossly normal. 7. The aortic valve was not well visualized Moderate sclerosis of the aortic valve. 8. The inferior vena cava was dilated in size with <50% respiratory variability.  NUCLEAR STRESS 12/10/2018  No T wave inversion was noted during stress.  Defect 1: There is a medium defect of moderate severity present in the basal inferior and basal inferolateral location.  Defect 2: There is a small defect of moderate severity present in the apex location.  The left ventricular ejection fraction is severely decreased (<30%).  Findings consistent with ischemia and prior myocardial infarction.  This is a high risk study.  High risk pharmacological nuclear perfusion study due to severely depressed left ventricular systolic function. Perfusion images suggest a moderate-size inferobasal scar and a small area of apical ischemia, which is largely reversible. The degree of ventricular dysfunction is out of proportion to the area of the perfusion abnormalities, suggesting mixed etiology for the cardiomyopathy.  RIGHT/LEFT HEART CATH AND CORONARY/GRAFT ANGIOGRAPHY  Conclusion     Prox RCA to Mid RCA lesion is 100% stenosed.  Ost LM to Mid LM lesion is 50% stenosed.  Ost Cx lesion is 95% stenosed.  Prox LAD lesion is 100% stenosed.  Ost 1st Diag to 1st Diag lesion is  70% stenosed.  Origin to Insertion lesion before 1st Diag is 100% stenosed.   Larry Klein is a 63 y.o. male    [id number] LOCATION:  FACILITY: [facility]  PHYSICIAN: Quay Burow, M.D. Feb 08, 1956    Right Heart   Right Heart Pressures Right atrial pressure- 15/20 Right ventricular pressure- 55/10 Pulmonary artery pressure-56-28, mean 36 Pulmonary wedge pressure- A-wave 36, V wave 35, mean 36 LVEDP- 28 Cardiac output 3.24 L/min with an index of 1.54 L/min by per millimeter squared (Fick) Cardiac output 6 L/min, cardiac index 2.9 L/min/m (thermodilution)    Patient Profile     63 y.o. male with ESRD on HD, mixed ischemic and non-ischemic CM with LVEF 20-25%,history of CABG x 4 in [place],  [place] in 2017,persistent afib with RVR, chronic thrombocytopenia of unknown etiology, COPD and anemia.  Assessment & Plan    1. CAD/unstable angina:  - trop peak 0.26 on 04/08, c/w previous values 11/2018 - previously not cath candidate due to low plt (hx CABG x 4 in 2017, grafts unknown). - Cath today showed patent grafts to the LAD and RCA. There is a severe ostial LCx lesion. The segment of SVG between the diagonal and OM is patent but occluded proximally.  - based on anatomy I would recommend continued medical therapy. Symptoms of dyspnea on exertion more consistent with elevated LV filling pressures and secondary pulmonary HTN. I feel he would benefit most from tighter volume management.  - if he were to have refractory angina could consider PCI of the ostial LCx but this would require atherectomy and stenting into the left main and would require DAPT for one year. For now best managed medically.  - continue Toprol XL 25 mg qd, Imdur 30 mg qd, Lipitor 40 mg qd - do not feel BP high enough to increase doses - if HemOnc feels plt count can be maintained, restart ASA 81 mg daily.  2. CHF (chronic systolic):  - volume mgt per HD - based on right heart data he needs better volume  management.  - continue meds as above - recheck echo in 3 months - Cards MD in [place] had discussed him getting a device in 2019, EF may have been low for > 6 months. Not sure long term prognosis good enough to consider ICD. Await other evaluation particularly whether leg mass is cancerous.  - BP limits med titration  3. AFib:  - basically permanent - rate control w/ amio, BB - CHA2DS2-VASc = 3 (CHF, CAD, HTN) - Prev no anticoag due to low plt  4. ITP on steroids with improved platelet count.   5. PAD s/p aortic stent graft. Right leg pain. Plan LE arterial angiogram per VVS.   6. ESRD on hemodialysis.  7. 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 Anemia of chronic disease      For questions or updates, please contact [place] Please consult [email or web address] for contact info under        Signed, Nell Schrack [place], MD  01/07/2019, 10:48 AM

## 2019-01-07 NOTE — Progress Notes (Signed)
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 fib with RVR, chronic thrombocytopenia, anemia, COPD, HTN, admitted on 12/23/2018 with complaints of chest pain and dyspnea on exertion, initially presented to [facility] ED where CTA chest was negative for PE, transferred to [facility] for cardiology evaluation.  Cardiology evaluated for unstable angina, was on heparin drip, felt not a candidate for surgical or percutaneous revascularization (prior CABG, thrombocytopenia), added Imdur and signed off.  COVID testing negative.  Nephrology consulting for HD needs.Vascular surgery consulted for right foot pain and plan arteriogram 4/21 pending further improvement in thrombocytopenia.  Hematology consulted for thrombocytopenia.   Assessment & Plan:   Principal Problem:   Chest pain Active Problems:   Dyspnea   ESRD (end stage renal disease) on dialysis [facility])   Atrial fibrillation with RVR (HCC)   Acute on chronic systolic heart failure (HCC)   Cardiomyopathy (HCC)   PVD (peripheral vascular disease) ([place])   Coronary artery disease with unstable angina Previously not a candidate for intervention or anti platelet agets due to thrombocytopenia.  Cardiology re consulted as his platelets improved and he underwent cardiac cath, and recommend to continue medical therapy and better volume management with HD.  Can be restarted on aspirin 81 mg daily if hematology approves.    Acute on chronic systolic heart failure Continue with volume management with hemodialysis Continue with beta-blocker, 25 mg daily and Imdur 30 mg daily .  Will need better volume management with HD.  Cardiology recommend repeat echocardiogram in 3 months.    Defer ICD placement to cardiology.    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 Hematology consulted recommended pulse  Decadron therapy and outpatient follow-up.  Platelets have improved dramatically.  Currently on prednisone 30 mg daily.    Persistent atrial fibrillation Rate controlled with amiodarone and Toprol at this time, patient is not on anticoagulation due to thrombocytopenia.   Mild transaminitis due to CHF Patient denies any nausea vomiting or abdominal pain at this time.  Acute hepatitis panel negative. Repeat liver enzymes show alk phos of 117, AST OF 32 and ALT OF 64.  No further work up needed.    End-stage renal disease on hemodialysis Nephrology on board and appreciate recommendations.    Lower extremity swelling status post left TMA and right leg swelling Probably from peripheral vascular disease.  No DVT on Venous duplex.  Vascular surgery consulted and plan for arteriogram on Friday.   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  He will need referral to Orthopedic oncology at [place] for further evaluation.    COPD No wheezing heard continue with nebs as needed.   DVT prophylaxis: none. scd's couldn't be applied due to leg tenderness and swelling.  Code Status: full code.  Family Communication: none at bedside.  Disposition Plan: pending further work up.   Consultants:  Dr Oneida Alar with vascular surgery.  Nephrology with Dr Marval Regal.  Hematology Dr Benay Spice.    Procedures:  Arteriogram scheduled for Friday.   Antimicrobials:none.   Subjective: Calm and comfortable, denies any new complaints post cath.  He is happy that he got cath done and recommendations were medical management.   Objective: Vitals:  01/07/19 1030 01/07/19 1045 01/07/19 1100 01/07/19 1145  BP: (!) 129/110 116/78 116/80 129/85  Pulse: 60 95 76 60  Resp:      Temp:      TempSrc:      SpO2: 100% 100%   100%  Weight:      Height:        Intake/Output Summary (Last 24 hours) at 01/07/2019 1432 Last data filed at 01/07/2019 1210 Gross per 24 hour  Intake 690 ml  Output 3000 ml  Net -2310 ml   Filed Weights   01/06/19 1625 01/06/19 1700 01/07/19 0318  Weight: 89.4 kg 92.7 kg 90.2 kg    Examination:  General exam: not in distress , calm and comfortable.  Respiratory system: diminished at bases, no wheezing or rhonchi.  Cardiovascular system: S1 & S2 heard, irregular.  Gastrointestinal system: Abdomen is nondistended, soft and nontender. . Normal bowel sounds heard. Central nervous system: Alert and oriented. No focal neurological deficits. Extremities: bilateral lower extremity swelling and tenderness present. Left metatarsal amputation.  Skin: No rashes, lesions or ulcers Psychiatry: calm today.     Data Reviewed: I have personally reviewed following labs and imaging studies  CBC: Recent Labs  Lab 01/04/19 0816 01/04/19 1131 01/05/19 0432 01/06/19 0415 01/07/19 0315  WBC 10.3 10.3 11.2* 12.2* 14.4*  HGB 12.0* 11.9* 11.8* 11.8* 12.2*  HCT 39.5 38.9* 38.4* 37.4* 39.0  MCV 91.6 91.7 90.6 90.8 91.8  PLT 138* 119* 141* 136* 431*   Basic Metabolic Panel: Recent Labs  Lab 01/02/19 1154 01/04/19 1131 01/06/19 1239 01/07/19 0315  NA 136 135 137 140  K 4.5 4.7 3.7 3.9  CL 97* 98 98 102  CO2 25 21* 23 24  GLUCOSE 143* 140* 149* 130*  BUN 52* 65* 74* 42*  CREATININE 6.84* 6.75* 6.74* 5.35*  CALCIUM 8.6* 8.7* 8.4* 8.3*  PHOS 3.6 3.0 2.5  --    GFR: Estimated Creatinine Clearance: 14.9 mL/min (A) (by C-G formula based on SCr of 5.35 mg/dL (H)). Liver Function Tests: Recent Labs  Lab 01/02/19 1154 01/04/19 1131 01/06/19 1239 01/07/19 0315  AST  --   --   --  32  ALT  --   --   --  64*  ALKPHOS  --   --   --  117  BILITOT  --   --   --  0.8  PROT  --   --   --  5.7*  ALBUMIN 3.3* 3.5 3.3* 3.3*   No results for input(s): LIPASE, AMYLASE in the last 168  hours. No results for input(s): AMMONIA in the last 168 hours. Coagulation Profile: No results for input(s): INR, PROTIME in the last 168 hours. Cardiac Enzymes: No results for input(s): CKTOTAL, CKMB, CKMBINDEX, TROPONINI in the last 168 hours. BNP (last 3 results) No results for input(s): PROBNP in the last 8760 hours. HbA1C: No results for input(s): HGBA1C in the last 72 hours. CBG: Recent Labs  Lab 01/06/19 2015  GLUCAP 189*   Lipid Profile: No results for input(s): CHOL, HDL, LDLCALC, TRIG, CHOLHDL, LDLDIRECT in the last 72 hours. Thyroid Function Tests: No results for input(s): TSH, T4TOTAL, FREET4, T3FREE, THYROIDAB in the last 72 hours. Anemia Panel: No results for input(s): VITAMINB12, FOLATE, FERRITIN, TIBC, IRON, RETICCTPCT in the last 72 hours. Sepsis Labs: No results for input(s): PROCALCITON, LATICACIDVEN in the last 168 hours.  No results found for this or any previous visit (from the past 240 hour(s)).  Radiology Studies: No results found.      Scheduled Meds:  albuterol  2.5 mg Nebulization TID   amiodarone  200 mg Oral Daily   [START ON 01/08/2019] aspirin  81 mg Oral Daily   atorvastatin  80 mg Oral q1800   Chlorhexidine Gluconate Cloth  6 each Topical Q0600   gabapentin  300 mg Oral QHS   isosorbide mononitrate  30 mg Oral Daily   metoprolol succinate  50 mg Oral Daily   multivitamin  1 tablet Oral Daily   pantoprazole  40 mg Oral QAC breakfast   predniSONE  30 mg Oral Q breakfast   sodium chloride flush  3 mL Intravenous Q12H   sodium chloride flush  3 mL Intravenous Q12H   umeclidinium-vilanterol  1 puff Inhalation Daily   Continuous Infusions:  sodium chloride     sodium chloride       LOS: 14 days    Time spent: 26 minutes.     Hosie Poisson, MD [facility] Pager [phone]  If 7PM-7AM, please contact night-coverage [email or web address] Password [facility] 01/07/2019, 2:32 PM

## 2019-01-07 NOTE — Progress Notes (Signed)
Patient ID: Morrell Fluke, male   DOB: [birth year], 63 y.o.   MRN: [id number] S: Pt sedated post cardiac cath which revealed diffuse native disease, patent grafts, high grade L cx lesion  O:BP 129/85   Pulse 60   Temp 98.1 F (36.7 C) (Oral)   Resp 19   Ht 5\' 11"  (1.803 m)   Wt 90.2 kg Comment: A scale  SpO2 100%   BMI 27.73 kg/m   Intake/Output Summary (Last 24 hours) at 01/07/2019 1357 Last data filed at 01/07/2019 1210 Gross per 24 hour  Intake 690 ml  Output 3000 ml  Net -2310 ml   Intake/Output: I/O last 3 completed shifts: In: 76 [P.O.:730] Out: 3000 [Other:3000]  Intake/Output this shift:  Total I/O In: 360 [P.O.:360] Out: -  Weight change: 0.3 kg Gen: NAD CVS: no rub Resp: cta Abd: benign Ext: trace edema  Recent Labs  Lab 01/02/19 1154 01/04/19 1131 01/06/19 1239 01/07/19 0315  NA 136 135 137 140  K 4.5 4.7 3.7 3.9  CL 97* 98 98 102  CO2 25 21* 23 24  GLUCOSE 143* 140* 149* 130*  BUN 52* 65* 74* 42*  CREATININE 6.84* 6.75* 6.74* 5.35*  ALBUMIN 3.3* 3.5 3.3* 3.3*  CALCIUM 8.6* 8.7* 8.4* 8.3*  PHOS 3.6 3.0 2.5  --   AST  --   --   --  32  ALT  --   --   --  64*   Liver Function Tests: Recent Labs  Lab 01/04/19 1131 01/06/19 1239 01/07/19 0315  AST  --   --  32  ALT  --   --  64*  ALKPHOS  --   --  117  BILITOT  --   --  0.8  PROT  --   --  5.7*  ALBUMIN 3.5 3.3* 3.3*   No results for input(s): LIPASE, AMYLASE in the last 168 hours. No results for input(s): AMMONIA in the last 168 hours. CBC: Recent Labs  Lab 01/04/19 0816 01/04/19 1131 01/05/19 0432 01/06/19 0415 01/07/19 0315  WBC 10.3 10.3 11.2* 12.2* 14.4*  HGB 12.0* 11.9* 11.8* 11.8* 12.2*  HCT 39.5 38.9* 38.4* 37.4* 39.0  MCV 91.6 91.7 90.6 90.8 91.8  PLT 138* 119* 141* 136* 127*   Cardiac Enzymes: No results for input(s): CKTOTAL, CKMB, CKMBINDEX, TROPONINI in the last 168 hours. CBG: Recent Labs  Lab 01/06/19 2015  GLUCAP 189*    Iron Studies: No results for  input(s): IRON, TIBC, TRANSFERRIN, FERRITIN in the last 72 hours. Studies/Results: No results found. Marland Kitchen albuterol  2.5 mg Nebulization TID  . amiodarone  200 mg Oral Daily  . [START ON 01/08/2019] aspirin  81 mg Oral Daily  . atorvastatin  80 mg Oral q1800  . Chlorhexidine Gluconate Cloth  6 each Topical Q0600  . gabapentin  300 mg Oral QHS  . isosorbide mononitrate  30 mg Oral Daily  . metoprolol succinate  50 mg Oral Daily  . multivitamin  1 tablet Oral Daily  . pantoprazole  40 mg Oral QAC breakfast  . predniSONE  30 mg Oral Q breakfast  . sodium chloride flush  3 mL Intravenous Q12H  . sodium chloride flush  3 mL Intravenous Q12H  . umeclidinium-vilanterol  1 puff Inhalation Daily    BMET    Component Value Date/Time   NA 140 01/07/2019 0315   K 3.9 01/07/2019 0315   CL 102 01/07/2019 0315   CO2 24 01/07/2019 0315   GLUCOSE 130 (H)  01/07/2019 0315   BUN 42 (H) 01/07/2019 0315   CREATININE 5.35 (H) 01/07/2019 0315   CALCIUM 8.3 (L) 01/07/2019 0315   GFRNONAA 10 (L) 01/07/2019 0315   GFRAA 12 (L) 01/07/2019 0315   CBC    Component Value Date/Time   WBC 14.4 (H) 01/07/2019 0315   RBC 4.25 01/07/2019 0315   HGB 12.2 (L) 01/07/2019 0315   HCT 39.0 01/07/2019 0315   PLT 127 (L) 01/07/2019 0315   MCV 91.8 01/07/2019 0315   MCH 28.7 01/07/2019 0315   MCHC 31.3 01/07/2019 0315   RDW 19.6 (H) 01/07/2019 0315   LYMPHSABS 1.0 12/27/2018 0442   MONOABS 0.8 12/27/2018 0442   EOSABS 0.4 12/27/2018 0442   BASOSABS 0.0 12/27/2018 0442    Dialysis Orders: TTS at [facility] 4h400/800 92.5kg 3K/2.25bath P2 LIJTDC Heparin none -Calcitriol 0.5 mcg PO TIW  Assessment/Plan: 1. R foot erythema: Hx L TMA.[facility] planning arteriogram 01/08/19. 2. Thrombocytopenia:acute on chronic issue, negative HIT.Hem/onc treating as ITP,getting course of prednisoneand platelets rising daily 3. 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 Recommend biopsy  while he remains an inpatient to expedite treatment as an outpatient biopsy will likely be delayed several weeks to months 4. ESRD:Off schedule. Angiogramis scheduled 01/08/19 and will get back onscheduleSaturday unless volume becomes an issue tomorrow. 5. BP/volume:BP ok, does have some LE edema. Currently below hisdry wt. Will continue to challenge edw as tolerated. 6. Unstable Angina/ hx CABG- seen by Dr Debara Pickett.Concerned about the cardiac catheterization in setting of thrombocytopenia. Had recent Myoview with fixed defect. 1. S/p cath today with severe ostial Lcx lesion and patent grafts and recommends medical management per Cardiology.. 2. Continues on Imdur 30 mg daily, toprol xl 25 mg, lipitor 40 mg 3. Would consider PCI if he has refractory angina per Dr. [place] 7. Vascular access-failed left cimino avf. Will ask [facility] if they could evaluate for possible AVG/AVF placement while he remains an inpatient.  8. Congestive heart failure:LVEF 25% 9. Anemia of CKD:Hgb 11.1 - no ESA for now. 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 Continue home meds. 11. Hyperlipidemia continues on atorvastatin 12. GERD continues on Protonix 13. Atrial fibrillation: Not on anticoagulation. TSH 2.734  Donetta Potts, MD [employer] [phone]

## 2019-01-07 NOTE — Progress Notes (Addendum)
IP PROGRESS NOTE  Subjective: [name] has no new complaints.  Continues prednisone.  He reports no thrush. Had cardiac cath this am. Tolerated procedure well.  Objective: Vital signs in last 24 hours: Blood pressure (!) 142/84, pulse 73, temperature 98.1 F (36.7 C), temperature source Oral, resp. rate 19, height 5\' 11"  (1.803 m), weight 198 lb 12.8 oz (90.2 kg), SpO2 100 %.  Intake/Output from previous day: 04/22 0701 - 04/23 0700 In: 630 [P.O.:630] Out: 3000   Physical Exam: HEENT: No thrush Extremities: There is masslike fullness in the left lower leg, erythema at the right and left foot.  Trace pitting edema at the low leg bilaterally  Lab Results: Recent Labs    01/06/19 0415 01/07/19 0315  WBC 12.2* 14.4*  HGB 11.8* 12.2*  HCT 37.4* 39.0  PLT 136* 127*    BMET Recent Labs    01/06/19 1239 01/07/19 0315  NA 137 140  K 3.7 3.9  CL 98 102  CO2 23 24  GLUCOSE 149* 130*  BUN 74* 42*  CREATININE 6.74* 5.35*  CALCIUM 8.4* 8.3*    No results found for: CEA1  Studies/Results: No results found.  Medications: I have reviewed the patient's current medications.  Assessment/Plan:  1.  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  Renal failure-on hemodialysis 3.  Coronary artery disease 4.  COPD 5.  Peripheral vascular disease 6.  Anemia 7.  CHF 8.  Left calf mass on CTA 12/28/2018, ultrasound 12/29/2018- solid mass without vascularity 9.  Elevated liver enzymes   The platelet count remains stable at 127,000 this morning. Vascular surgery planning for angiogram Friday 01/08/2019.  Cardiology has started patient on aspirin.  Had cardiac cath this am. We can consider an outpatient course of pulse Decadron therapy with the hope of obtaining a longer remission from the ITP.  The MRI of the left lower extremity is suggestive of a malignant process.  Results have previously been discussed with the patient.  We  will make an orthopedic oncology referral as an outpatient.   Recommendations:  1.  Continue prednisone 30 mg daily. 2.  CBC 01/09/2019 3.  Outpatient follow-up at the [facility] cancer center and with orthopedic oncology will be arranged    LOS: 14 days   [name], NP   01/07/2019, 10:21 AM   [name] appears stable.  Results of the cardiac catheterization noted.  I agree with aspirin therapy.  He will continue prednisone with plans for a slow outpatient taper.  We will begin a trial of pulse Decadron if the platelet count falls.  I will contact orthopedics to see if the left calf mass can be evaluated and biopsied in [place].

## 2019-01-07 NOTE — Interval H&P Note (Signed)
Cath Lab Visit (complete for each Cath Lab visit)  Clinical Evaluation Leading to the Procedure:   ACS: No.  Non-ACS:    Anginal Classification: CCS II  Anti-ischemic medical therapy: Minimal Therapy (1 class of medications)  Non-Invasive Test Results: Low-risk stress test findings: cardiac mortality <1%/year  Prior CABG: Previous CABG      History and Physical Interval Note:  01/07/2019 7:44 AM  Larry Klein  has presented today for surgery, with the diagnosis of cad/cp nstemi.  The various methods of treatment have been discussed with the patient and family. After consideration of risks, benefits and other options for treatment, the patient has consented to  Procedure(s): RIGHT/LEFT HEART CATH AND CORONARY/GRAFT ANGIOGRAPHY (N/A) as a surgical intervention.  The patient's history has been reviewed, patient examined, no change in status, stable for surgery.  I have reviewed the patient's chart and labs.  Questions were answered to the patient's satisfaction.     Quay Burow

## 2019-01-07 NOTE — Progress Notes (Signed)
Patient refused morning medications at this time, stated he will take It when breakfast arrives.

## 2019-01-07 NOTE — Progress Notes (Signed)
Received post Ca Cath, patient alert and oriented. Right Femoral sifte dressing CDI, level 0. Will monitor accordingly.

## 2019-01-07 NOTE — Progress Notes (Signed)
Site area: Right groin a 7 french venous sheath was removed  Site Prior to Removal:  Level 0  Pressure Applied For 15 MINUTES    Bedrest Beginning at 0845am  Manual:   Yes.    Patient Status During Pull:  stable  Post Pull Groin Site:  Level 0  Post Pull Instructions Given:  Yes.    Post Pull Pulses Present:  Yes.    Dressing Applied:  Yes.    Comments:   \

## 2019-01-08 ENCOUNTER — Encounter (HOSPITAL_COMMUNITY): Payer: Self-pay | Admitting: Vascular Surgery

## 2019-01-08 ENCOUNTER — Encounter (HOSPITAL_COMMUNITY)
Admission: EM | Disposition: A | Discharge status: Home / Self Care | Payer: Self-pay | Source: Other Acute Inpatient Hospital | Attending: Internal Medicine

## 2019-01-08 ENCOUNTER — Ambulatory Visit (HOSPITAL_COMMUNITY): Admission: RE | Admit: 2019-01-08 | Payer: Medicare Other | Source: Home / Self Care | Admitting: Vascular Surgery

## 2019-01-08 DIAGNOSIS — I771 Stricture of artery: Secondary | ICD-10-CM

## 2019-01-08 DIAGNOSIS — I200 Unstable angina: Secondary | ICD-10-CM

## 2019-01-08 HISTORY — PX: LOWER EXTREMITY ANGIOGRAPHY: CATH118251

## 2019-01-08 LAB — BASIC METABOLIC PANEL
Anion gap: 16 — ABNORMAL HIGH (ref 5–15)
BUN: 68 mg/dL — ABNORMAL HIGH (ref 8–23)
CO2: 22 mmol/L (ref 22–32)
Calcium: 8.2 mg/dL — ABNORMAL LOW (ref 8.9–10.3)
Chloride: 100 mmol/L (ref 98–111)
Creatinine, Ser: 6.85 mg/dL — ABNORMAL HIGH (ref 0.61–1.24)
GFR calc Af Amer: 9 mL/min — ABNORMAL LOW (ref >60)
GFR calc non Af Amer: 8 mL/min — ABNORMAL LOW (ref >60)
Glucose, Bld: 115 mg/dL — ABNORMAL HIGH (ref 70–99)
Potassium: 4.1 mmol/L (ref 3.5–5.1)
Sodium: 138 mmol/L (ref 135–145)

## 2019-01-08 LAB — CBC
HCT: 37.7 % — ABNORMAL LOW (ref 39.0–52.0)
Hemoglobin: 11.6 g/dL — ABNORMAL LOW (ref 13.0–17.0)
MCH: 28.4 pg (ref 26.0–34.0)
MCHC: 30.8 g/dL (ref 30.0–36.0)
MCV: 92.4 fL (ref 80.0–100.0)
Platelets: 107 10*3/uL — ABNORMAL LOW (ref 150–400)
RBC: 4.08 MIL/uL — ABNORMAL LOW (ref 4.22–5.81)
RDW: 19.3 % — ABNORMAL HIGH (ref 11.5–15.5)
WBC: 13.8 10*3/uL — ABNORMAL HIGH (ref 4.0–10.5)
nRBC: 0 % (ref 0.0–0.2)

## 2019-01-08 SURGERY — LOWER EXTREMITY ANGIOGRAPHY
Anesthesia: LOCAL | Laterality: Bilateral

## 2019-01-08 MED ORDER — SODIUM CHLORIDE 0.9 % IV SOLN: 250.0000 mL | INTRAVENOUS | Status: DC | PRN

## 2019-01-08 MED ORDER — HYDRALAZINE HCL 20 MG/ML IJ SOLN: 5.0000 mg | INTRAMUSCULAR | Status: DC | PRN

## 2019-01-08 MED ORDER — HEPARIN (PORCINE) IN NACL 1000-0.9 UT/500ML-% IV SOLN
INTRAVENOUS | Status: DC | PRN
  Administered 2019-01-08: 500 mL

## 2019-01-08 MED ORDER — LIDOCAINE HCL (PF) 1 % IJ SOLN
INTRAMUSCULAR | Status: DC | PRN
  Administered 2019-01-08: 15 mL

## 2019-01-08 MED ORDER — SODIUM CHLORIDE 0.9% FLUSH
3.0000 mL | Freq: Two times a day (BID) | INTRAVENOUS | Status: DC
  Administered 2019-01-08 – 2019-01-17 (×15): 3 mL via INTRAVENOUS

## 2019-01-08 MED ORDER — IODIXANOL 320 MG/ML IV SOLN
INTRAVENOUS | Status: DC | PRN
  Administered 2019-01-08: 08:00:00 97 mL via INTRA_ARTERIAL

## 2019-01-08 MED ORDER — ONDANSETRON HCL 4 MG/2ML IJ SOLN
4.0000 mg | Freq: Four times a day (QID) | INTRAMUSCULAR | Status: DC | PRN
  Administered 2019-01-12 – 2019-01-13 (×2): 4 mg via INTRAVENOUS
  Filled 2019-01-08: qty 2

## 2019-01-08 MED ORDER — SODIUM CHLORIDE 0.9% FLUSH: 3.0000 mL | INTRAVENOUS | Status: DC | PRN

## 2019-01-08 MED ORDER — LIDOCAINE HCL (PF) 1 % IJ SOLN
INTRAMUSCULAR | Status: AC
  Filled 2019-01-08: qty 30

## 2019-01-08 MED ORDER — HEPARIN (PORCINE) IN NACL 1000-0.9 UT/500ML-% IV SOLN
INTRAVENOUS | Status: AC
  Filled 2019-01-08: qty 1000

## 2019-01-08 MED ORDER — ACETAMINOPHEN 325 MG PO TABS
650.0000 mg | ORAL_TABLET | ORAL | Status: DC | PRN
  Administered 2019-01-08 – 2019-01-10 (×3): 650 mg via ORAL
  Filled 2019-01-08 (×3): qty 2

## 2019-01-08 MED ORDER — LABETALOL HCL 5 MG/ML IV SOLN: 10.0000 mg | INTRAVENOUS | Status: DC | PRN

## 2019-01-08 SURGICAL SUPPLY — 10 items
CATH OMNI FLUSH 5F 65CM (CATHETERS) ×1 IMPLANT
CLOSURE MYNX CONTROL 5F (Vascular Products) ×1 IMPLANT
KIT MICROPUNCTURE NIT STIFF (SHEATH) ×1 IMPLANT
KIT PV (KITS) ×2 IMPLANT
SHEATH PINNACLE 5F 10CM (SHEATH) ×1 IMPLANT
SHEATH PROBE COVER 6X72 (BAG) ×1 IMPLANT
SYR MEDRAD MARK V 150ML (SYRINGE) ×1 IMPLANT
TRANSDUCER W/STOPCOCK (MISCELLANEOUS) ×2 IMPLANT
TRAY PV CATH (CUSTOM PROCEDURE TRAY) ×2 IMPLANT
WIRE BENTSON .035X145CM (WIRE) ×1 IMPLANT

## 2019-01-08 NOTE — Progress Notes (Signed)
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 fib with RVR, chronic thrombocytopenia, anemia, COPD, HTN, admitted on 12/23/2018 with complaints of chest pain and dyspnea on exertion, initially presented to [facility] ED where CTA chest was negative for PE, transferred to [facility] for cardiology evaluation.  Cardiology evaluated for unstable angina, was on heparin drip, felt not a candidate for surgical or percutaneous revascularization (prior CABG, thrombocytopenia), added Imdur and signed off.  COVID testing negative.  Nephrology consulting for HD needs.Vascular surgery consulted for right foot pain and plan arteriogram 4/21 pending further improvement in thrombocytopenia.  Hematology consulted for thrombocytopenia.   Assessment & Plan:   Principal Problem:   Chest pain Active Problems:   Dyspnea   ESRD (end stage renal disease) on dialysis [facility])   Atrial fibrillation with RVR (HCC)   Acute on chronic systolic heart failure (HCC)   Cardiomyopathy (HCC)   PVD (peripheral vascular disease) ([place])   Coronary artery disease with unstable angina Previously not a candidate for intervention or anti platelet agets due to thrombocytopenia.  Cardiology re consulted as his platelets improved and he underwent cardiac cath, and recommend to continue medical therapy and better volume management with HD.  Started the patient on aspirin 81 mg daily.    Acute on chronic systolic heart failure Continue with volume management with hemodialysis Continue with beta-blocker, 25 mg daily and Imdur 30 mg daily .  Will need better volume management with HD.  Cardiology recommend repeat echocardiogram in 3 months.  Defer ICD  placement to cardiology.    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 Hematology consulted recommended pulse  Decadron therapy and outpatient follow-up.  Platelets have improved dramatically.  Currently on prednisone 30 mg daily.    Persistent atrial fibrillation Rate controlled with amiodarone and Toprol at this time, patient is not on anticoagulation due to thrombocytopenia.   Mild transaminitis due to CHF Patient denies any nausea vomiting or abdominal pain at this time.  Acute hepatitis panel negative. Repeat liver enzymes show alk phos of 117, AST OF 32 and ALT OF 64.  No further work up needed.    End-stage renal disease on hemodialysis Nephrology on board and appreciate recommendations.    Left Lower extremity swelling status post left TMA and right leg swelling Probably from peripheral vascular disease.  No DVT on Venous duplex.  Vascular surgery consulted, underwent aortogram and bilateral lower extremity arteriogram with runoff. Pt has occluded anterior tibial arteries bilateral.  Further recommendations as per vascular.     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  He will need referral to Orthopedic oncology at [place] for further evaluation. Oncology on board and will refer the patient.    COPD No wheezing heard continue with nebs as needed.   DVT prophylaxis: none. scd's couldn't be applied due to leg tenderness and swelling.  Code Status: full code.  Family Communication: none at bedside.  Disposition Plan: pending further work up.   Consultants:  Dr Oneida Alar with vascular surgery.  Nephrology with Dr Marval Regal.  Hematology Dr Benay Spice.    Procedures:  Arteriogram scheduled for Friday.   Antimicrobials:none.   Subjective: No chest pain or sob.  Objective: Vitals:   01/08/19 0931 01/08/19 0931 01/08/19 0932 01/08/19 1000  BP: 123/78 118/78 118/78 112/82  Pulse: 85 94 94 84   Resp:   18   Temp:  97.7 F (36.5 C) 97.7 F (36.5 C)   TempSrc:  Oral Oral   SpO2:  100% 100%   Weight:      Height:        Intake/Output Summary (Last 24 hours) at 01/08/2019 1135 Last data filed at 01/08/2019 1049 Gross per 24 hour  Intake 963 ml  Output 0 ml  Net 963 ml   Filed Weights   01/06/19 1700 01/07/19 0318 01/08/19 0259  Weight: 92.7 kg 90.2 kg 92.5 kg    Examination:  General exam: not in distress , calm and comfortable.  Respiratory system: diminished at bases, no wheezing or rhonchi.  Cardiovascular system: S1 & S2 heard, irregular. No JVD.  Gastrointestinal system: Abdomen is nondistended, soft and nontender. . Normal bowel sounds heard. Central nervous system: Alert and oriented. No focal neurological deficits. Extremities: bilateral lower extremity swelling and tenderness present. Left metatarsal amputation.  Skin: No rashes, lesions or ulcers Psychiatry: calm today.     Data Reviewed: I have personally reviewed following labs and imaging studies  CBC: Recent Labs  Lab 01/04/19 1131 01/05/19 0432 01/06/19 0415 01/07/19 0315 01/07/19 0808 01/08/19 0351  WBC 10.3 11.2* 12.2* 14.4*  --  13.8*  HGB 11.9* 11.8* 11.8* 12.2* 13.6  13.6 11.6*  HCT 38.9* 38.4* 37.4* 39.0 40.0  40.0 37.7*  MCV 91.7 90.6 90.8 91.8  --  92.4  PLT 119* 141* 136* 127*  --  161*   Basic Metabolic Panel: Recent Labs  Lab 01/02/19 1154 01/04/19 1131 01/06/19 1239 01/07/19 0315 01/07/19 0808 01/08/19 0351  NA 136 135 137 140 138  138 138  K 4.5 4.7 3.7 3.9 4.0  4.0 4.1  CL 97* 98 98 102  --  100  CO2 25 21* 23 24  --  22  GLUCOSE 143* 140* 149* 130*  --  115*  BUN 52* 65* 74* 42*  --  68*  CREATININE 6.84* 6.75* 6.74* 5.35*  --  6.85*  CALCIUM 8.6* 8.7* 8.4* 8.3*  --  8.2*  PHOS 3.6 3.0 2.5  --   --   --    GFR: Estimated Creatinine Clearance: 12.7 mL/min (A) (by C-G formula based on SCr of 6.85 mg/dL (H)). Liver Function Tests: Recent Labs  Lab 01/02/19  1154 01/04/19 1131 01/06/19 1239 01/07/19 0315  AST  --   --   --  32  ALT  --   --   --  64*  ALKPHOS  --   --   --  117  BILITOT  --   --   --  0.8  PROT  --   --   --  5.7*  ALBUMIN 3.3* 3.5 3.3* 3.3*   No results for input(s): LIPASE, AMYLASE in the last 168 hours. No results for input(s): AMMONIA in the last 168 hours. Coagulation Profile: No results for input(s): INR, PROTIME in the last 168 hours. Cardiac Enzymes: No results for input(s): CKTOTAL, CKMB, CKMBINDEX, TROPONINI in the last 168 hours. BNP (last 3 results) No results for input(s): PROBNP in the last 8760 hours. HbA1C: No results for input(s): HGBA1C in the last 72 hours. CBG: Recent Labs  Lab 01/06/19 2015  GLUCAP 189*   Lipid Profile: No results for input(s): CHOL, HDL, LDLCALC, TRIG, CHOLHDL, LDLDIRECT in the  last 72 hours. Thyroid Function Tests: No results for input(s): TSH, T4TOTAL, FREET4, T3FREE, THYROIDAB in the last 72 hours. Anemia Panel: No results for input(s): VITAMINB12, FOLATE, FERRITIN, TIBC, IRON, RETICCTPCT in the last 72 hours. Sepsis Labs: No results for input(s): PROCALCITON, LATICACIDVEN in the last 168 hours.  No results found for this or any previous visit (from the past 240 hour(s)).       Radiology Studies: No results found.      Scheduled Meds: . albuterol  2.5 mg Nebulization TID  . amiodarone  200 mg Oral Daily  . aspirin  81 mg Oral Daily  . atorvastatin  80 mg Oral q1800  . Chlorhexidine Gluconate Cloth  6 each Topical Q0600  . gabapentin  300 mg Oral QHS  . isosorbide mononitrate  30 mg Oral Daily  . metoprolol succinate  50 mg Oral Daily  . multivitamin  1 tablet Oral Daily  . pantoprazole  40 mg Oral QAC breakfast  . predniSONE  30 mg Oral Q breakfast  . sodium chloride flush  3 mL Intravenous Q12H  . sodium chloride flush  3 mL Intravenous Q12H  . sodium chloride flush  3 mL Intravenous Q12H  . umeclidinium-vilanterol  1 puff Inhalation Daily    Continuous Infusions: . sodium chloride    . sodium chloride    . sodium chloride       LOS: 15 days    Time spent: 28 minutes.     Hosie Poisson, MD [facility] Pager [phone]  If 7PM-7AM, please contact night-coverage [email or web address] Password [facility] 01/08/2019, 11:35 AM

## 2019-01-08 NOTE — Progress Notes (Signed)
Nothing to do for right leg given inline flow via PT.  Needs vein mapping for AVF placement.  Will order.  [name], MD [facility] [place] Office: [phone] Pager: [place]

## 2019-01-08 NOTE — Progress Notes (Signed)
Nutrition Follow-up  RD working remotely.  DOCUMENTATION CODES:   Not applicable  INTERVENTION:   -Continue renal MVI daily -Continue with liberalized diet to promote adequate PO intake  NUTRITION DIAGNOSIS:   Increased nutrient needs related to chronic illness(ESRD on HD) as evidenced by estimated needs.  Ongoing  GOAL:   Patient will meet greater than or equal to 90% of their needs  Progressing  MONITOR:   PO intake, Supplement acceptance, Labs, Weight trends, Skin, I & O's  REASON FOR ASSESSMENT:   LOS    ASSESSMENT:   Larry Klein is a 63 y.o. male with medical history significant of CAD, COPD, hypertension, ESRD on dialysis.  He reports chest pressure and tightness across his entire chest as well as dyspnea with exertion over the past few days.  This is constant in nature.  He denies any fevers or cough.  Had some nausea without vomiting, no diarrhea.  No worsening peripheral edema from his baseline.  He presented to [facility], underwent CTA chest which was negative for PE or any acute findings.  ED physician discussed with [facility] cardiology, was recommended to transfer to [facility] and start on IV heparin.  Patient was apparently also tested for low risk COVID-19 rule out.  4/12- COVID-19 negative 4/13- CTA revealed lt calf mass 4/14- oncology consult; prednisone started due to thrombocytopenia likely related to ITP; ultrasound revealed solid lt calf mass without vascularity 4/24- s/p rt and lt heart and coronary graft/angiography- revealed severe ostial LCx lesions, segment of SVG between diagonal and OM is patent but occluded proximally 4/24- s/p Aortogram, bilateral lower extremity arteriogram  Reviewed I/O's: +603 ml x 24 hours and -472 ml since 12/25/18  Pt's last HD on 01/06/19.   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 Recommending biopsy to expedite outpatient treatment. Pt is being referred to  orthopedic oncology as an outpatient for further work-up.  Pt remains with good appetite; noted continued 100% meal completion. He is taking his renal MVI.   Labs reviewed.   Diet Order:   Diet Order            Diet regular Room service appropriate? Yes; Fluid consistency: Thin  Diet effective now              EDUCATION NEEDS:   Education needs have been addressed  Skin:  Skin Assessment: Reviewed RN Assessment  Last BM:  01/07/19  Height:   Ht Readings from Last 1 Encounters:  12/26/18 5\' 11"  (1.803 m)    Weight:   Wt Readings from Last 1 Encounters:  01/08/19 92.5 kg    Ideal Body Weight:  78.2 kg  BMI:  Body mass index is 28.44 kg/m.  Estimated Nutritional Needs:   Kcal:  1950-2150  Protein:  100-115 grams  Fluid:  1000 ml + UOP    Elenore Wanninger A. Jimmye Norman, RD, LDN, [place] Registered Dietitian II Certified Diabetes Care and Education Specialist Pager: [phone] After hours Pager: [phone]

## 2019-01-08 NOTE — Progress Notes (Signed)
Pt to have left arm access fistula vs graft on Monday.  Will get vein map tomorrow.   Risks benefits procedure discussed as well as arteriogram findings from today  Ruta Hinds, MD [facility] [place] Office: [phone] Pager: [phone]

## 2019-01-08 NOTE — Progress Notes (Signed)
  Results of cath and LE angio reviewed.   He is stable without CP. Continue medical management for now with aggressive RF modification per Dr. Doug Sou note with ASA, atorva 80, Toprol and Imdur.   Cardiology will s/o. Please cal with questions   Glori Bickers, MD  11:30 AM

## 2019-01-08 NOTE — Op Note (Signed)
Patient name: Larry Klein MRN: [id number] DOB: 1955/12/12 Sex: male  01/08/2019 Pre-operative Diagnosis: Concern for PAD and dependent rubor in right foot Post-operative diagnosis:  Same Surgeon:  [name], MD Procedure Performed: 1.  Ultrasound-guided access of the left common femoral artery 2.  Aortogram 3.  Bilatera lower extremity arteriogram with runoff 4.  Mynx closure of the left common femoral artery   Indications: Patient is a 63 year old male with history of end-stage renal disease that was evaluated by Dr. Oneida Alar with concern for dependent rubor in the right foot and depressed ABIs.  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  In addition to evaluating the right lower extremity with concern for dependent rubor patient also has concern for possible sarcoma in the left leg and we will evaluate runoff in both legs for future surgical planning.  Findings:  Aortogram revealed no significant aortoiliac disease and the aortobiiliac endograft appears appropriately positioned with no evidence of endoleak. Right lower extremity arteriogram showed a patent common femoral and profunda as well as a calcified but patent SFA with no flow-limiting stenosis.  The above and below-knee popliteal artery was widely patent.  Patient has dominant runoff in the right lower extremity via the posterior tibial artery with inline flow into the plantar arch.  Anterior tibial is occluded throughout its course and there is no dorsalis pedis that reconstitutes.  The peroneal is also patent although diminutive.  Patient has significant small vessel disease in the right foot. Left lower extremity arteriogram revealed a patent common femoral, profunda, SFA with no flow-limiting stenosis.  Above and below-knee popliteal artery was patent.  Patient had dominant runoff in the left lower  extremity via the posterior tibial with inline flow into the plantar arch.  Peroneal was also patent although diminutive.  The anterior tibial is occluded throughout its course with no filling of the dorsalis pedis.   Procedure:  The patient was identified in the holding area and taken to room 8.  The patient was then placed supine on the table and prepped and draped in the usual sterile fashion.  A time out was called.  Ultrasound was used to evaluate the left common femoral artery.  It was patent .  A digital ultrasound image was acquired.  A micropuncture needle was used to access the left common femoral artery under ultrasound guidance.  An 018 wire was advanced without resistance and a micropuncture sheath was placed.  The 018 wire was removed and a benson wire was placed.  The micropuncture sheath was exchanged for a 5 french sheath.  An omniflush catheter was advanced over the wire to the level of L-1.  An abdominal angiogram was obtained.  Pertinent findings are noted above.  Next the catheter was pulled down to just above the endograft bifurcation and bilateral lower extremity runoff was obtained also with pertinent findings noted above.  I did not think there was any role for intervention in the right lower extremity given that the patient had inline flow into the foot via the posterior tibial artery that was widely patent with filling of the plantar arch.  Patient has significant small vessel disease in the right foot with an occluded anterior tibial throughout its course.  At that point in time wires and catheters were removed.  A 5 French mynx was deployed in the left common femoral artery and pressure was held for  5 minutes.  He will be taken to his room in stable condition.     [name], MD [facility] [place] Office: [phone] Pager: [place]

## 2019-01-08 NOTE — Progress Notes (Signed)
[facility] [place]  Subjective  - Heart cath via right femoral access yesterday.   Objective 119/68 95 97.7 F (36.5 C) (Oral) 18 100%  Intake/Output Summary (Last 24 hours) at 01/08/2019 0744 Last data filed at 01/07/2019 2220 Gross per 24 hour  Intake 603 ml  Output -  Net 603 ml   Palpable left femoral pulse  Laboratory Lab Results: Recent Labs    01/07/19 0315 01/07/19 0808 01/08/19 0351  WBC 14.4*  --  13.8*  HGB 12.2* 13.6  13.6 11.6*  HCT 39.0 40.0  40.0 37.7*  PLT 127*  --  107*   BMET Recent Labs    01/07/19 0315 01/07/19 0808 01/08/19 0351  NA 140 138  138 138  K 3.9 4.0  4.0 4.1  CL 102  --  100  CO2 24  --  22  GLUCOSE 130*  --  115*  BUN 42*  --  68*  CREATININE 5.35*  --  6.85*  CALCIUM 8.3*  --  8.2*    COAG Lab Results  Component Value Date   INR 1.2 12/10/2018   INR 1.18 09/18/2018   No results found for: PTT  Assessment/Planning:  Aortogram, bilateral lower extremity arteriogram today.  [name] 01/08/2019 7:44 AM --

## 2019-01-08 NOTE — Progress Notes (Signed)
Patient was off the unit at a procedure this am at the time of rounds. Chart and labs have been review and [name] plans to see the patient on Sunday if he is still admitted. Continue current dose of Prednisone.   Spoke with Ortho regarding biopsy of L calf mass. They recommend referral to Dr. Magda Bernheim at [facility]. This can be arranged as an outpatient.   [name], DNP, AGPCNP-BC, AOCNP

## 2019-01-08 NOTE — Progress Notes (Addendum)
[facility] Progress Note   Subjective:  Seen in room. No CP, mild dyspnea. Offered extra HD today, he reports that he will be fine until tomorrow. Medical management recommended for both CAD and PAD.  Objective Vitals:   01/08/19 0931 01/08/19 0931 01/08/19 0932 01/08/19 1000  BP: 123/78 118/78 118/78 112/82  Pulse: 85 94 94 84  Resp:   18   Temp:  97.7 F (36.5 C) 97.7 F (36.5 C)   TempSrc:  Oral Oral   SpO2:  100% 100%   Weight:      Height:       Physical Exam General: Chronically ill appearing man, NAD Heart: RRR; 2/6 systolic murmur Lungs: CTAB Abdomen: distended, soft Extremities: 1+ LE edema in B ankles - soft tissue mass in L ankle; purplish discoloration to B feet Dialysis Access: TDC in L chest; non-maturing L forearm AVF  Additional Objective Labs: Basic Metabolic Panel: Recent Labs  Lab 01/02/19 1154 01/04/19 1131 01/06/19 1239 01/07/19 0315 01/07/19 0808 01/08/19 0351  NA 136 135 137 140 138  138 138  K 4.5 4.7 3.7 3.9 4.0  4.0 4.1  CL 97* 98 98 102  --  100  CO2 25 21* 23 24  --  22  GLUCOSE 143* 140* 149* 130*  --  115*  BUN 52* 65* 74* 42*  --  68*  CREATININE 6.84* 6.75* 6.74* 5.35*  --  6.85*  CALCIUM 8.6* 8.7* 8.4* 8.3*  --  8.2*  PHOS 3.6 3.0 2.5  --   --   --    Liver Function Tests: Recent Labs  Lab 01/04/19 1131 01/06/19 1239 01/07/19 0315  AST  --   --  32  ALT  --   --  64*  ALKPHOS  --   --  117  BILITOT  --   --  0.8  PROT  --   --  5.7*  ALBUMIN 3.5 3.3* 3.3*   CBC: Recent Labs  Lab 01/04/19 1131 01/05/19 0432 01/06/19 0415 01/07/19 0315 01/07/19 0808 01/08/19 0351  WBC 10.3 11.2* 12.2* 14.4*  --  13.8*  HGB 11.9* 11.8* 11.8* 12.2* 13.6  13.6 11.6*  HCT 38.9* 38.4* 37.4* 39.0 40.0  40.0 37.7*  MCV 91.7 90.6 90.8 91.8  --  92.4  PLT 119* 141* 136* 127*  --  107*   Medications: . sodium chloride    . sodium chloride    . sodium chloride     . albuterol  2.5 mg Nebulization TID  .  amiodarone  200 mg Oral Daily  . aspirin  81 mg Oral Daily  . atorvastatin  80 mg Oral q1800  . Chlorhexidine Gluconate Cloth  6 each Topical Q0600  . gabapentin  300 mg Oral QHS  . isosorbide mononitrate  30 mg Oral Daily  . metoprolol succinate  50 mg Oral Daily  . multivitamin  1 tablet Oral Daily  . pantoprazole  40 mg Oral QAC breakfast  . predniSONE  30 mg Oral Q breakfast  . sodium chloride flush  3 mL Intravenous Q12H  . sodium chloride flush  3 mL Intravenous Q12H  . sodium chloride flush  3 mL Intravenous Q12H  . umeclidinium-vilanterol  1 puff Inhalation Daily    Dialysis Orders: TTS at [facility] 4h400/800 92.5kg 3K/2.25bath P2 LIJTDC Heparin none -Calcitriol 0.5 mcg PO TIW  Assessment/Plan: 1. R foot erythema: Hx L TMA.S/p LE arteriogram 01/08/19; B anterior tibial artery occlusions. 2. Thrombocytopenia:acute on chronic issue,  negative HIT.Hem/onc treating as ITP,getting course of prednisoneand platelets rising daily 3. L calf mass: MRI done, possible malignant/ sarcoma per radiology report 1. Recommendbiopsy while he remains an inpatient to expedite treatment as an outpatient biopsy will likely be delayed several weeks to months 4. ESRD:Off schedule this week, will dialyze tomorrow per usual TTS schedule.  5. BP/volume:BP stable, + edema. UF as tolerated. 1. Unstable Angina/Hx CABG: S/p cath 4/23 with severe ostial LCx lesion and patent grafts and recommends medical management per Cardiology. 2. Continues on Imdur 30 mg daily, toprol xl 25 mg, lipitor 40 mg 3. Would consider PCI if he has refractory angina per Dr. [place] 6. Vascular access: Poorly matured L forearm AVF. Have asked [facility] if they could evaluate for possible AVG/AVF placement while he remains an inpatient.  7. Congestive heart failure:LVEF 25% 8. Anemia of CKD:Hgb 11.1 - no ESA for now. 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 Continue home meds. 10. Hyperlipidemia continues on  atorvastatin 11. GERD continues on Protonix 12. Atrial fibrillation: Not on anticoagulation. TSH 2.734  Veneta Penton, PA-C 01/08/2019, 11:54 AM  [facility] Pager: [phone]  I have seen and examined this patient and agree with plan and assessment in the above note with renal recommendations/intervention highlighted.  Plan for AVG placement per [facility].  Awaiting angiogram results. Broadus John A Tameeka Luo,MD 01/08/2019 1:44 PM

## 2019-01-09 ENCOUNTER — Encounter (HOSPITAL_COMMUNITY): Payer: Medicare Other

## 2019-01-09 LAB — CBC WITH DIFFERENTIAL/PLATELET
Abs Immature Granulocytes: 0.51 10*3/uL — ABNORMAL HIGH (ref 0.00–0.07)
Basophils Absolute: 0 10*3/uL (ref 0.0–0.1)
Basophils Relative: 0 %
Eosinophils Absolute: 0 10*3/uL (ref 0.0–0.5)
Eosinophils Relative: 0 %
HCT: 40.1 % (ref 39.0–52.0)
Hemoglobin: 12.1 g/dL — ABNORMAL LOW (ref 13.0–17.0)
Immature Granulocytes: 3 %
Lymphocytes Relative: 5 %
Lymphs Abs: 0.8 10*3/uL (ref 0.7–4.0)
MCH: 28.1 pg (ref 26.0–34.0)
MCHC: 30.2 g/dL (ref 30.0–36.0)
MCV: 93 fL (ref 80.0–100.0)
Monocytes Absolute: 1.2 10*3/uL — ABNORMAL HIGH (ref 0.1–1.0)
Monocytes Relative: 8 %
Neutro Abs: 13.5 10*3/uL — ABNORMAL HIGH (ref 1.7–7.7)
Neutrophils Relative %: 84 %
Platelets: 130 10*3/uL — ABNORMAL LOW (ref 150–400)
RBC: 4.31 MIL/uL (ref 4.22–5.81)
RDW: 19.6 % — ABNORMAL HIGH (ref 11.5–15.5)
WBC: 16.1 10*3/uL — ABNORMAL HIGH (ref 4.0–10.5)
nRBC: 0.1 % (ref 0.0–0.2)

## 2019-01-09 LAB — RENAL FUNCTION PANEL
Albumin: 3.3 g/dL — ABNORMAL LOW (ref 3.5–5.0)
Anion gap: 16 — ABNORMAL HIGH (ref 5–15)
BUN: 89 mg/dL — ABNORMAL HIGH (ref 8–23)
CO2: 23 mmol/L (ref 22–32)
Calcium: 8.4 mg/dL — ABNORMAL LOW (ref 8.9–10.3)
Chloride: 98 mmol/L (ref 98–111)
Creatinine, Ser: 8.02 mg/dL — ABNORMAL HIGH (ref 0.61–1.24)
GFR calc Af Amer: 7 mL/min — ABNORMAL LOW (ref >60)
GFR calc non Af Amer: 6 mL/min — ABNORMAL LOW (ref >60)
Glucose, Bld: 134 mg/dL — ABNORMAL HIGH (ref 70–99)
Phosphorus: 4.8 mg/dL — ABNORMAL HIGH (ref 2.5–4.6)
Potassium: 4.2 mmol/L (ref 3.5–5.1)
Sodium: 137 mmol/L (ref 135–145)

## 2019-01-09 MED ORDER — HEPARIN SODIUM (PORCINE) 1000 UNIT/ML IJ SOLN
INTRAMUSCULAR | Status: AC
  Administered 2019-01-09: 1000 [IU]
  Filled 2019-01-09: qty 4

## 2019-01-09 NOTE — Progress Notes (Signed)
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 fib with RVR, chronic thrombocytopenia, anemia, COPD, HTN, admitted on 12/23/2018 with complaints of chest pain and dyspnea on exertion, initially presented to [facility] ED where CTA chest was negative for PE, transferred to [facility] for cardiology evaluation.  Cardiology evaluated for unstable angina, was on heparin drip, felt not a candidate for surgical or percutaneous revascularization (prior CABG, thrombocytopenia), added Imdur and signed off.  COVID testing negative.  Nephrology consulting for HD needs.Vascular surgery consulted for right foot pain and plan arteriogram 4/21 pending further improvement in thrombocytopenia.  Hematology consulted for thrombocytopenia.   Assessment & Plan:   Principal Problem:   Chest pain Active Problems:   Dyspnea   ESRD (end stage renal disease) on dialysis [facility])   Atrial fibrillation with RVR (HCC)   Acute on chronic systolic heart failure (HCC)   Cardiomyopathy (HCC)   PVD (peripheral vascular disease) ([place])   Coronary artery disease with unstable angina Previously not a candidate for intervention or anti platelet agets due to thrombocytopenia.  Cardiology re consulted as his platelets improved and he underwent cardiac cath, and recommend to continue medical therapy and better volume management with HD.  Started the patient on aspirin 81 mg daily.    Acute on chronic systolic heart failure Continue with volume management with hemodialysis Continue with beta-blocker, 25 mg daily and Imdur 30 mg daily .  Will need better volume management with HD.  Cardiology recommend repeat echocardiogram in 3 months.  Defer ICD  placement to cardiology.    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 Hematology consulted recommended pulse  Decadron therapy and outpatient follow-up.  Platelets have improved dramatically.  Currently on prednisone 30 mg daily.    Persistent atrial fibrillation Rate controlled with amiodarone and Toprol at this time, patient is not on anticoagulation due to thrombocytopenia.   Mild transaminitis due to CHF Patient denies any nausea vomiting or abdominal pain at this time.  Acute hepatitis panel negative. Repeat liver enzymes show alk phos of 117, AST OF 32 and ALT OF 64.  No further work up needed.    End-stage renal disease on hemodialysis Nephrology on board and appreciate recommendations.    Left Lower extremity swelling status post left TMA and right leg swelling Probably from peripheral vascular disease.  No DVT on Venous duplex.  Vascular surgery consulted, underwent aortogram and bilateral lower extremity arteriogram with runoff. Pt has occluded anterior tibial arteries bilateral.   S/P right lower extremity arteriogram - in line flow via PT and as per vascular surgery,  no role for revascularization.   Scheduled for left arm AVF vs graft Monday with Dr. Oneida Alar.    Vein mapping pending.   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  He will need referral to Orthopedic oncology at [place] for further evaluation. Oncology on board and will refer the patient.    COPD No wheezing heard continue with nebs as needed.   DVT prophylaxis: none. scd's couldn't be applied due to leg tenderness and swelling.  Code Status: full code.  Family Communication: none at bedside.  Disposition Plan: possible d/c after AVG  Consultants:  Dr Oneida Alar  with vascular surgery.  Nephrology with Dr Marval Regal.  Hematology Dr Benay Spice.    Procedures:  Arteriogram scheduled for Friday.   Antimicrobials:none.   Subjective:  No chest pain or sob.  No new complaints.   Objective: Vitals:   01/09/19 0830 01/09/19 0900 01/09/19 0930 01/09/19 1000  BP: 101/81 96/76 118/85 (!) 119/99  Pulse: 62 (!) 57 77 90  Resp:      Temp:      TempSrc:      SpO2:  100%    Weight:      Height:        Intake/Output Summary (Last 24 hours) at 01/09/2019 1027 Last data filed at 01/08/2019 1700 Gross per 24 hour  Intake 860 ml  Output 0 ml  Net 860 ml   Filed Weights   01/08/19 0259 01/09/19 0440 01/09/19 0710  Weight: 92.5 kg 94.1 kg 94.1 kg    Examination:  General exam: not in distress , calm and comfortable.  Respiratory system: diminished at bases, no wheezing or rhonchi.  Cardiovascular system: S1 & S2 heard, irregular. No JVD.  Gastrointestinal system: Abdomen is nondistended, soft and nontender. . Normal bowel sounds heard. Central nervous system: Alert and oriented. Non focal.  Extremities: bilateral lower extremity swelling and tenderness present. Left metatarsal amputation.  Skin: No rashes, lesions or ulcers Psychiatry: calm today.     Data Reviewed: I have personally reviewed following labs and imaging studies  CBC: Recent Labs  Lab 01/05/19 0432 01/06/19 0415 01/07/19 0315 01/07/19 0808 01/08/19 0351 01/09/19 0735  WBC 11.2* 12.2* 14.4*  --  13.8* 16.1*  NEUTROABS  --   --   --   --   --  13.5*  HGB 11.8* 11.8* 12.2* 13.6  13.6 11.6* 12.1*  HCT 38.4* 37.4* 39.0 40.0  40.0 37.7* 40.1  MCV 90.6 90.8 91.8  --  92.4 93.0  PLT 141* 136* 127*  --  107* 831*   Basic Metabolic Panel: Recent Labs  Lab 01/02/19 1154 01/04/19 1131 01/06/19 1239 01/07/19 0315 01/07/19 0808 01/08/19 0351 01/09/19 0735  NA 136 135 137 140 138  138 138 137  K 4.5 4.7 3.7 3.9 4.0  4.0 4.1 4.2  CL 97* 98 98 102  --  100 98  CO2 25 21* 23 24  --  22 23  GLUCOSE 143* 140* 149* 130*  --  115* 134*  BUN 52* 65* 74* 42*  --  68* 89*  CREATININE 6.84* 6.75* 6.74* 5.35*  --  6.85* 8.02*  CALCIUM 8.6* 8.7* 8.4*  8.3*  --  8.2* 8.4*  PHOS 3.6 3.0 2.5  --   --   --  4.8*   GFR: Estimated Creatinine Clearance: 10.9 mL/min (A) (by C-G formula based on SCr of 8.02 mg/dL (H)). Liver Function Tests: Recent Labs  Lab 01/02/19 1154 01/04/19 1131 01/06/19 1239 01/07/19 0315 01/09/19 0735  AST  --   --   --  32  --   ALT  --   --   --  64*  --   ALKPHOS  --   --   --  117  --   BILITOT  --   --   --  0.8  --   PROT  --   --   --  5.7*  --   ALBUMIN 3.3* 3.5 3.3* 3.3* 3.3*   No results for input(s): LIPASE, AMYLASE in the last 168 hours. No results for input(s): AMMONIA in the last 168  hours. Coagulation Profile: No results for input(s): INR, PROTIME in the last 168 hours. Cardiac Enzymes: No results for input(s): CKTOTAL, CKMB, CKMBINDEX, TROPONINI in the last 168 hours. BNP (last 3 results) No results for input(s): PROBNP in the last 8760 hours. HbA1C: No results for input(s): HGBA1C in the last 72 hours. CBG: Recent Labs  Lab 01/06/19 2015  GLUCAP 189*   Lipid Profile: No results for input(s): CHOL, HDL, LDLCALC, TRIG, CHOLHDL, LDLDIRECT in the last 72 hours. Thyroid Function Tests: No results for input(s): TSH, T4TOTAL, FREET4, T3FREE, THYROIDAB in the last 72 hours. Anemia Panel: No results for input(s): VITAMINB12, FOLATE, FERRITIN, TIBC, IRON, RETICCTPCT in the last 72 hours. Sepsis Labs: No results for input(s): PROCALCITON, LATICACIDVEN in the last 168 hours.  No results found for this or any previous visit (from the past 240 hour(s)).       Radiology Studies: No results found.      Scheduled Meds: . albuterol  2.5 mg Nebulization TID  . amiodarone  200 mg Oral Daily  . aspirin  81 mg Oral Daily  . atorvastatin  80 mg Oral q1800  . Chlorhexidine Gluconate Cloth  6 each Topical Q0600  . gabapentin  300 mg Oral QHS  . isosorbide mononitrate  30 mg Oral Daily  . metoprolol succinate  50 mg Oral Daily  . multivitamin  1 tablet Oral Daily  . pantoprazole  40 mg  Oral QAC breakfast  . predniSONE  30 mg Oral Q breakfast  . sodium chloride flush  3 mL Intravenous Q12H  . sodium chloride flush  3 mL Intravenous Q12H  . sodium chloride flush  3 mL Intravenous Q12H  . umeclidinium-vilanterol  1 puff Inhalation Daily   Continuous Infusions: . sodium chloride    . sodium chloride    . sodium chloride       LOS: 16 days    Time spent: 28 minutes.     Hosie Poisson, MD [facility] Pager [phone]  If 7PM-7AM, please contact night-coverage [email or web address] Password [facility] 01/09/2019, 10:27 AM

## 2019-01-09 NOTE — Procedures (Signed)
I was present at this dialysis session. I have reviewed the session itself and made appropriate changes.   Vital signs in last 24 hours:  Temp:  [97.7 F (36.5 C)-98 F (36.7 C)] 97.8 F (36.6 C) (04/25 0440) Pulse Rate:  [0-153] 82 (04/25 0440) Resp:  [0-105] 18 (04/25 0440) BP: (112-131)/(78-116) 119/82 (04/25 0440) SpO2:  [0 %-100 %] 99 % (04/25 0440) Weight:  [94.1 kg] 94.1 kg (04/25 0440) Weight change: 1.6 kg Filed Weights   01/07/19 0318 01/08/19 0259 01/09/19 0440  Weight: 90.2 kg 92.5 kg 94.1 kg    Recent Labs  Lab 01/06/19 1239  01/08/19 0351  NA 137   < > 138  K 3.7   < > 4.1  CL 98   < > 100  CO2 23   < > 22  GLUCOSE 149*   < > 115*  BUN 74*   < > 68*  CREATININE 6.74*   < > 6.85*  CALCIUM 8.4*   < > 8.2*  PHOS 2.5  --   --    < > = values in this interval not displayed.    Recent Labs  Lab 01/07/19 0315 01/07/19 0808 01/08/19 0351 01/09/19 0735  WBC 14.4*  --  13.8* 16.1*  NEUTROABS  --   --   --  13.5*  HGB 12.2* 13.6  13.6 11.6* 12.1*  HCT 39.0 40.0  40.0 37.7* 40.1  MCV 91.8  --  92.4 93.0  PLT 127*  --  107* 130*    Scheduled Meds: . albuterol  2.5 mg Nebulization TID  . amiodarone  200 mg Oral Daily  . aspirin  81 mg Oral Daily  . atorvastatin  80 mg Oral q1800  . Chlorhexidine Gluconate Cloth  6 each Topical Q0600  . gabapentin  300 mg Oral QHS  . isosorbide mononitrate  30 mg Oral Daily  . metoprolol succinate  50 mg Oral Daily  . multivitamin  1 tablet Oral Daily  . pantoprazole  40 mg Oral QAC breakfast  . predniSONE  30 mg Oral Q breakfast  . sodium chloride flush  3 mL Intravenous Q12H  . sodium chloride flush  3 mL Intravenous Q12H  . sodium chloride flush  3 mL Intravenous Q12H  . umeclidinium-vilanterol  1 puff Inhalation Daily   Continuous Infusions: . sodium chloride    . sodium chloride    . sodium chloride     PRN Meds:.sodium chloride, sodium chloride, sodium chloride, acetaminophen, albuterol, bismuth  subsalicylate, diazepam, hydrALAZINE, HYDROcodone-acetaminophen, labetalol, morphine injection, ondansetron (ZOFRAN) IV, sodium chloride flush, sodium chloride flush, sodium chloride flush    Assessment and Plan: 1. ESRD- cont with HD q TTS 2. PAD- s/p angiogram, intervention needed 3. Thrombocytopenia- due to ITP responding to steroids 4. CHF- volume improving with HD and UF 5. Left calf mass- worrisome for sarcoma and pending biopsy with Dr. Magda Bernheim at [facility] 6. Vascular access- for AVF/AVG on Monday and appreciate VVS assistance.  Donetta Potts,  MD 01/09/2019, 8:00 AM

## 2019-01-09 NOTE — Progress Notes (Signed)
PT Cancellation Note  Patient Details Name: Larry Klein MRN: [id number] DOB: 1956-05-28   Cancelled Treatment:    Reason Eval/Treat Not Completed: Patient at procedure or test/unavailable (HD). Will follow-up for PT evaluation as schedule permits.  Mabeline Caras, PT, DPT Acute Rehabilitation Services  Pager [phone] Office [place] 01/09/2019, 12:44 PM

## 2019-01-09 NOTE — Progress Notes (Signed)
[facility]  Subjective  - No complaints.  Seen in dialysis.   Objective (!) 119/99 90 98.9 F (37.2 C) (Oral) 18 100%  Intake/Output Summary (Last 24 hours) at 01/09/2019 1013 Last data filed at 01/08/2019 1700 Gross per 24 hour  Intake 860 ml  Output 0 ml  Net 860 ml    Left groin c/d/i, no hematoma Left radiocephalic fistula with no thrill  Laboratory Lab Results: Recent Labs    01/08/19 0351 01/09/19 0735  WBC 13.8* 16.1*  HGB 11.6* 12.1*  HCT 37.7* 40.1  PLT 107* 130*   BMET Recent Labs    01/08/19 0351 01/09/19 0735  NA 138 137  K 4.1 4.2  CL 100 98  CO2 22 23  GLUCOSE 115* 134*  BUN 68* 89*  CREATININE 6.85* 8.02*  CALCIUM 8.2* 8.4*    COAG Lab Results  Component Value Date   INR 1.2 12/10/2018   INR 1.18 09/18/2018   No results found for: PTT  Assessment/Planning:  S/P right lower extremity arteriogram - in line flow via PT and no role for revascularization.  Scheduled for left arm AVF vs graft Monday with Dr. Oneida Alar.  Please have NPO after midnight tomorrow.  Vein mapping pending.  [name] 01/09/2019 10:13 AM --

## 2019-01-10 ENCOUNTER — Inpatient Hospital Stay (HOSPITAL_COMMUNITY): Payer: Medicare Other

## 2019-01-10 DIAGNOSIS — Z01810 Encounter for preprocedural cardiovascular examination: Secondary | ICD-10-CM

## 2019-01-10 LAB — SURGICAL PCR SCREEN
MRSA, PCR: NEGATIVE
Staphylococcus aureus: NEGATIVE

## 2019-01-10 MED ORDER — ALBUTEROL SULFATE (2.5 MG/3ML) 0.083% IN NEBU
2.5000 mg | INHALATION_SOLUTION | Freq: Two times a day (BID) | RESPIRATORY_TRACT | Status: DC
  Administered 2019-01-10 – 2019-01-12 (×4): 2.5 mg via RESPIRATORY_TRACT
  Filled 2019-01-10 (×4): qty 3

## 2019-01-10 NOTE — Progress Notes (Signed)
IP PROGRESS NOTE  Subjective:  Larry Klein continues to have dyspnea.  No other complaint.  Objective: Vital signs in last 24 hours: Blood pressure 104/74, pulse 75, temperature 98.3 F (36.8 C), temperature source Oral, resp. rate 18, height 5\' 11"  (1.803 m), weight 205 lb 3.2 oz (93.1 kg), SpO2 100 %.  Intake/Output from previous day: 04/25 0701 - 04/26 0700 In: 960 [P.O.:960] Out: 2126   Physical Exam:  HEENT: no thrush   Lab Results: Recent Labs    01/08/19 0351 01/09/19 0735  WBC 13.8* 16.1*  HGB 11.6* 12.1*  HCT 37.7* 40.1  PLT 107* 130*    BMET Recent Labs    01/08/19 0351 01/09/19 0735  NA 138 137  K 4.1 4.2  CL 100 98  CO2 22 23  GLUCOSE 115* 134*  BUN 68* 89*  CREATININE 6.85* 8.02*  CALCIUM 8.2* 8.4*     Medications: I have reviewed the patient's current medications.  Assessment/Plan:  1.  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  Renal failure-on hemodialysis 3.  Coronary artery disease-cardiac catheterization 01/07/2019- severe ostial left circumflex lesion, medical therapy recommended, aspirin restarted 01/07/2019 4.  COPD 5.  Peripheral vascular disease-lower extremity arteriogram 01/08/2019, distal bilateral arterial vascular disease, medical therapy recommended 6.  Anemia 7.  CHF 8.  Left calf mass on CTA 12/28/2018, ultrasound 12/29/2018- solid mass without vascularity 9.  Elevated liver enzymes  Larry Klein appears unchanged.  He continues hemodialysis.  He is scheduled for placement of a vascular shunt on 01/11/2019.  He has persistent mild thrombocytopenia, improved on prednisone.  I recommend beginning a slow prednisone taper at discharge.  Recommendations: 1.  Continue prednisone at a dose of 30 mg daily for now, will taper as an outpatient 2.  Refer to Dr. Magda Bernheim at [facility] to evaluate the left calf mass 3.  Outpatient follow-up will be scheduled at the [facility]  cancer center for within the next few weeks  LOS: 17 days   Betsy Coder, MD   01/10/2019, 7:27 AM

## 2019-01-10 NOTE — Progress Notes (Signed)
VASCULAR LAB PRELIMINARY  PRELIMINARY  PRELIMINARY  PRELIMINARY  Bilateral upper extremity vein mapping completed.    Preliminary report:  See CV Proc for results.  Julea Hutto, RVT 01/10/2019, 11:12 AM

## 2019-01-10 NOTE — Progress Notes (Signed)
Patient ID: Larry Klein, male   DOB: 1956/09/05, 63 y.o.   MRN: [id number] S: No new complaints O:BP 111/69 (BP Location: Right Arm)   Pulse 83   Temp 98.2 F (36.8 C) (Oral)   Resp 18   Ht 5\' 11"  (1.803 m)   Wt 93.1 kg   SpO2 99%   BMI 28.62 kg/m   Intake/Output Summary (Last 24 hours) at 01/10/2019 1056 Last data filed at 01/10/2019 0813 Gross per 24 hour  Intake 1080 ml  Output 2126 ml  Net -1046 ml   Intake/Output: I/O last 3 completed shifts: In: 960 [P.O.:960] Out: 2126 [Other:2126]  Intake/Output this shift:  Total I/O In: 120 [P.O.:120] Out: 0  Weight change: 0 kg Gen: NAD CVS: no rub Resp: cta Abd: benign Ext: 2+ edema of lower extremities, mass on calf of left leg, +erythema of legs bilaterally   Recent Labs  Lab 01/04/19 1131 01/06/19 1239 01/07/19 0315 01/07/19 0808 01/08/19 0351 01/09/19 0735  NA 135 137 140 138  138 138 137  K 4.7 3.7 3.9 4.0  4.0 4.1 4.2  CL 98 98 102  --  100 98  CO2 21* 23 24  --  22 23  GLUCOSE 140* 149* 130*  --  115* 134*  BUN 65* 74* 42*  --  68* 89*  CREATININE 6.75* 6.74* 5.35*  --  6.85* 8.02*  ALBUMIN 3.5 3.3* 3.3*  --   --  3.3*  CALCIUM 8.7* 8.4* 8.3*  --  8.2* 8.4*  PHOS 3.0 2.5  --   --   --  4.8*  AST  --   --  32  --   --   --   ALT  --   --  64*  --   --   --    Liver Function Tests: Recent Labs  Lab 01/06/19 1239 01/07/19 0315 01/09/19 0735  AST  --  32  --   ALT  --  64*  --   ALKPHOS  --  117  --   BILITOT  --  0.8  --   PROT  --  5.7*  --   ALBUMIN 3.3* 3.3* 3.3*   No results for input(s): LIPASE, AMYLASE in the last 168 hours. No results for input(s): AMMONIA in the last 168 hours. CBC: Recent Labs  Lab 01/05/19 0432 01/06/19 0415 01/07/19 0315 01/07/19 0808 01/08/19 0351 01/09/19 0735  WBC 11.2* 12.2* 14.4*  --  13.8* 16.1*  NEUTROABS  --   --   --   --   --  13.5*  HGB 11.8* 11.8* 12.2* 13.6  13.6 11.6* 12.1*  HCT 38.4* 37.4* 39.0 40.0  40.0 37.7* 40.1  MCV 90.6 90.8 91.8   --  92.4 93.0  PLT 141* 136* 127*  --  107* 130*   Cardiac Enzymes: No results for input(s): CKTOTAL, CKMB, CKMBINDEX, TROPONINI in the last 168 hours. CBG: Recent Labs  Lab 01/06/19 2015  GLUCAP 189*    Iron Studies: No results for input(s): IRON, TIBC, TRANSFERRIN, FERRITIN in the last 72 hours. Studies/Results: No results found. Marland Kitchen albuterol  2.5 mg Nebulization BID  . amiodarone  200 mg Oral Daily  . aspirin  81 mg Oral Daily  . atorvastatin  80 mg Oral q1800  . Chlorhexidine Gluconate Cloth  6 each Topical Q0600  . gabapentin  300 mg Oral QHS  . isosorbide mononitrate  30 mg Oral Daily  . metoprolol succinate  50 mg Oral Daily  .  multivitamin  1 tablet Oral Daily  . pantoprazole  40 mg Oral QAC breakfast  . predniSONE  30 mg Oral Q breakfast  . sodium chloride flush  3 mL Intravenous Q12H  . sodium chloride flush  3 mL Intravenous Q12H  . sodium chloride flush  3 mL Intravenous Q12H  . umeclidinium-vilanterol  1 puff Inhalation Daily    BMET    Component Value Date/Time   NA 137 01/09/2019 0735   K 4.2 01/09/2019 0735   CL 98 01/09/2019 0735   CO2 23 01/09/2019 0735   GLUCOSE 134 (H) 01/09/2019 0735   BUN 89 (H) 01/09/2019 0735   CREATININE 8.02 (H) 01/09/2019 0735   CALCIUM 8.4 (L) 01/09/2019 0735   GFRNONAA 6 (L) 01/09/2019 0735   GFRAA 7 (L) 01/09/2019 0735   CBC    Component Value Date/Time   WBC 16.1 (H) 01/09/2019 0735   RBC 4.31 01/09/2019 0735   HGB 12.1 (L) 01/09/2019 0735   HCT 40.1 01/09/2019 0735   PLT 130 (L) 01/09/2019 0735   MCV 93.0 01/09/2019 0735   MCH 28.1 01/09/2019 0735   MCHC 30.2 01/09/2019 0735   RDW 19.6 (H) 01/09/2019 0735   LYMPHSABS 0.8 01/09/2019 0735   MONOABS 1.2 (H) 01/09/2019 0735   EOSABS 0.0 01/09/2019 0735   BASOSABS 0.0 01/09/2019 0735   Dialysis Orders: TTS at  4h400/800 92.5kg 3K/2.25bath P2 LIJTDC Heparin none -Calcitriol 0.5 mcg PO TIW  Assessment and Plan: 1. ESRD- cont with HD q  TTS 2. Edema- continue to challenge edw as tolerated 3. PAD- s/p angiogram, intervention needed 4. Thrombocytopenia- due to ITP responding to steroids.  Per [name] will decrease to 30 mg and taper as an outpatient. 5. CHF- volume improving with HD and UF 6. Left calf mass- worrisome for sarcoma and pending biopsy with Dr. Magda Bernheim at [facility] 7. Vascular access- for AVF/AVG on Monday and appreciate VVS assistance.  8. Disposition- hopeful discharge tomorrow after avf/avg placement.  Donetta Potts, MD [employer] [phone]

## 2019-01-10 NOTE — Progress Notes (Signed)
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 fib with RVR, chronic thrombocytopenia, anemia, COPD, HTN, admitted on 12/23/2018 with complaints of chest pain and dyspnea on exertion, initially presented to [facility] ED where CTA chest was negative for PE, transferred to [facility] for cardiology evaluation.  Cardiology evaluated for unstable angina, was on heparin drip, felt not a candidate for surgical or percutaneous revascularization (prior CABG, thrombocytopenia), added Imdur and signed off.  COVID testing negative.  Nephrology consulting for HD needs.Vascular surgery consulted for right foot pain and plan arteriogram 4/21 pending further improvement in thrombocytopenia.  Hematology consulted for thrombocytopenia.   Assessment & Plan:   Principal Problem:   Chest pain Active Problems:   Dyspnea   ESRD (end stage renal disease) on dialysis [facility]   Atrial fibrillation with RVR (HCC)   Acute on chronic systolic heart failure (HCC)   Cardiomyopathy (HCC)   PVD (peripheral vascular disease) ([place])   Coronary artery disease with unstable angina Previously not a candidate for intervention or anti platelet agets due to thrombocytopenia.  Cardiology re consulted as his platelets improved and he underwent cardiac cath, and recommend to continue medical therapy and better volume management with HD.  Started the patient on aspirin 81 mg daily.    Acute on chronic systolic heart failure Continue with volume management with hemodialysis Continue with beta-blocker, 25 mg daily and Imdur 30 mg daily .  Will need better volume management with HD.  Cardiology recommend repeat echocardiogram in 3 months.  Defer ICD  placement to cardiology.    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 Hematology consulted recommended pulse  Decadron therapy and outpatient follow-up.  Platelets have improved dramatically.  Currently on prednisone 30 mg daily.    Persistent atrial fibrillation Rate controlled with amiodarone and Toprol at this time, patient is not on anticoagulation due to thrombocytopenia.   Mild transaminitis due to CHF Patient denies any nausea vomiting or abdominal pain at this time.  Acute hepatitis panel negative. Repeat liver enzymes show alk phos of 117, AST OF 32 and ALT OF 64.  No further work up needed.    End-stage renal disease on hemodialysis Nephrology on board and appreciate recommendations.    Left Lower extremity swelling status post left TMA and right leg swelling Probably from peripheral vascular disease.  No DVT on Venous duplex.  Vascular surgery consulted, underwent aortogram and bilateral lower extremity arteriogram with runoff. Pt has occluded anterior tibial arteries bilateral.   S/P right lower extremity arteriogram - in line flow via PT and as per vascular surgery,  no role for revascularization.   Scheduled for left arm AVF vs graft Monday with Dr. Oneida Alar.    Vein mapping done today.  Plan for AVG on Monday.   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  He will need referral to Orthopedic oncology at [place] for further evaluation. Oncology on board and will refer the patient.  Pain control.    COPD No wheezing heard continue with nebs as needed.   DVT prophylaxis: none. scd's couldn't be applied due to leg tenderness and swelling.  Code Status: full code.  Family Communication: none at bedside.  Disposition  Plan: possible d/c after AVG   Consultants:  Dr Oneida Alar with vascular surgery.  Nephrology with Dr Marval Regal.  Hematology Dr Benay Spice.    Procedures:  Arteriogram scheduled for  Friday.   Antimicrobials:none.   Subjective: No chest pain or sob.  No new complaints. Getting vein mapping.   Objective: Vitals:   01/10/19 0615 01/10/19 0709 01/10/19 0810 01/10/19 1148  BP: 104/74  111/69 107/83  Pulse: 75  83 98  Resp: '18  18 18  '$ Temp: 98.3 F (36.8 C)  98.2 F (36.8 C) 98.4 F (36.9 C)  TempSrc: Oral  Oral Oral  SpO2: 100% 100% 99% 100%  Weight: 93.1 kg     Height:        Intake/Output Summary (Last 24 hours) at 01/10/2019 1420 Last data filed at 01/10/2019 0813 Gross per 24 hour  Intake 1080 ml  Output 0 ml  Net 1080 ml   Filed Weights   01/09/19 0710 01/09/19 1115 01/10/19 0615  Weight: 94.1 kg 92.4 kg 93.1 kg    Examination:  General exam: not in distress , calm and comfortable.  Respiratory system: diminished at bases, no wheezing or rhonchi.  Cardiovascular system: S1 & S2 heard, irregular. No JVD.  Gastrointestinal system: Abdomen is nondistended, soft and nontender. . Normal bowel sounds heard. Central nervous system: Alert and oriented. Non focal.  Extremities: bilateral lower extremity swelling and tenderness present. Left metatarsal amputation.  Skin: No rashes, lesions or ulcers Psychiatry: calm today.     Data Reviewed: I have personally reviewed following labs and imaging studies  CBC: Recent Labs  Lab 01/05/19 0432 01/06/19 0415 01/07/19 0315 01/07/19 0808 01/08/19 0351 01/09/19 0735  WBC 11.2* 12.2* 14.4*  --  13.8* 16.1*  NEUTROABS  --   --   --   --   --  13.5*  HGB 11.8* 11.8* 12.2* 13.6  13.6 11.6* 12.1*  HCT 38.4* 37.4* 39.0 40.0  40.0 37.7* 40.1  MCV 90.6 90.8 91.8  --  92.4 93.0  PLT 141* 136* 127*  --  107* 160*   Basic Metabolic Panel: Recent Labs  Lab 01/04/19 1131 01/06/19 1239 01/07/19 0315 01/07/19 0808 01/08/19 0351 01/09/19 0735  NA 135 137 140 138  138 138 137  K 4.7 3.7 3.9 4.0  4.0 4.1 4.2  CL 98 98 102  --  100 98  CO2 21* 23 24  --  22 23  GLUCOSE 140* 149* 130*  --  115* 134*   BUN 65* 74* 42*  --  68* 89*  CREATININE 6.75* 6.74* 5.35*  --  6.85* 8.02*  CALCIUM 8.7* 8.4* 8.3*  --  8.2* 8.4*  PHOS 3.0 2.5  --   --   --  4.8*   GFR: Estimated Creatinine Clearance: 10.8 mL/min (A) (by C-G formula based on SCr of 8.02 mg/dL (H)). Liver Function Tests: Recent Labs  Lab 01/04/19 1131 01/06/19 1239 01/07/19 0315 01/09/19 0735  AST  --   --  32  --   ALT  --   --  64*  --   ALKPHOS  --   --  117  --   BILITOT  --   --  0.8  --   PROT  --   --  5.7*  --   ALBUMIN 3.5 3.3* 3.3* 3.3*   No results for input(s): LIPASE, AMYLASE in the last 168 hours. No results for input(s): AMMONIA in the last 168 hours. Coagulation Profile: No results for input(s):  INR, PROTIME in the last 168 hours. Cardiac Enzymes: No results for input(s): CKTOTAL, CKMB, CKMBINDEX, TROPONINI in the last 168 hours. BNP (last 3 results) No results for input(s): PROBNP in the last 8760 hours. HbA1C: No results for input(s): HGBA1C in the last 72 hours. CBG: Recent Labs  Lab 01/06/19 2015  GLUCAP 189*   Lipid Profile: No results for input(s): CHOL, HDL, LDLCALC, TRIG, CHOLHDL, LDLDIRECT in the last 72 hours. Thyroid Function Tests: No results for input(s): TSH, T4TOTAL, FREET4, T3FREE, THYROIDAB in the last 72 hours. Anemia Panel: No results for input(s): VITAMINB12, FOLATE, FERRITIN, TIBC, IRON, RETICCTPCT in the last 72 hours. Sepsis Labs: No results for input(s): PROCALCITON, LATICACIDVEN in the last 168 hours.  No results found for this or any previous visit (from the past 240 hour(s)).       Radiology Studies: Vas [place] Upper Ext Vein Mapping (pre-op Avf)  Result Date: 01/10/2019 UPPER EXTREMITY VEIN MAPPING  Indications: Pre-op dialysis access.  Examination Guidelines: A complete evaluation includes B-mode imaging, spectral Doppler, color Doppler, and power Doppler as needed of all accessible portions of each vessel. Bilateral testing is considered an integral part of a  complete examination. Limited examinations for reoccurring indications may be performed as noted. +-----------------+-------------+----------+------------+ Right Cephalic   Diameter (cm)Depth (cm)  Findings   +-----------------+-------------+----------+------------+ Prox upper arm       0.36        0.30                +-----------------+-------------+----------+------------+ Mid upper arm        0.34        0.31                +-----------------+-------------+----------+------------+ Dist upper arm       0.35        0.25                +-----------------+-------------+----------+------------+ Antecubital fossa    0.38        0.27                +-----------------+-------------+----------+------------+ Prox forearm                            IV, bandages +-----------------+-------------+----------+------------+ Mid forearm          0.27        0.20                +-----------------+-------------+----------+------------+ Wrist                0.25        0.22                +-----------------+-------------+----------+------------+ +-----------------+-------------+----------+--------+ Left Cephalic    Diameter (cm)Depth (cm)Findings +-----------------+-------------+----------+--------+ Prox upper arm       0.40        0.23            +-----------------+-------------+----------+--------+ Mid upper arm        0.37        0.24            +-----------------+-------------+----------+--------+ Dist upper arm       0.50        0.27            +-----------------+-------------+----------+--------+ Antecubital fossa    0.52        0.18            +-----------------+-------------+----------+--------+ Prox forearm  0.50        0.41            +-----------------+-------------+----------+--------+ Mid forearm          0.46        0.31            +-----------------+-------------+----------+--------+ Wrist                0.42        0.24             +-----------------+-------------+----------+--------+ *See table(s) above for measurements and observations.  Diagnosing physician: Monica Martinez MD Electronically signed by Monica Martinez MD on 01/10/2019 at 11:56:32 AM.    Final         Scheduled Meds: . albuterol  2.5 mg Nebulization BID  . amiodarone  200 mg Oral Daily  . aspirin  81 mg Oral Daily  . atorvastatin  80 mg Oral q1800  . Chlorhexidine Gluconate Cloth  6 each Topical Q0600  . gabapentin  300 mg Oral QHS  . isosorbide mononitrate  30 mg Oral Daily  . metoprolol succinate  50 mg Oral Daily  . multivitamin  1 tablet Oral Daily  . pantoprazole  40 mg Oral QAC breakfast  . predniSONE  30 mg Oral Q breakfast  . sodium chloride flush  3 mL Intravenous Q12H  . sodium chloride flush  3 mL Intravenous Q12H  . sodium chloride flush  3 mL Intravenous Q12H  . umeclidinium-vilanterol  1 puff Inhalation Daily   Continuous Infusions: . sodium chloride    . sodium chloride    . sodium chloride       LOS: 17 days    Time spent: 26 minutes.     Hosie Poisson, MD [facility] Pager [phone]  If 7PM-7AM, please contact night-coverage [email or web address] Password [email or web address] 01/10/2019, 2:20 PM

## 2019-01-10 NOTE — Progress Notes (Signed)
[facility]  Subjective  - No complaints.     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  Scheduled for left arm AVFtomorrow with Dr. Oneida Alar.  Please have NPO after midnight tonight.  Vein mapping still pending.  Consent ordered in chart.  Restrict left arm.   [name] 01/10/2019 9:59 AM --

## 2019-01-10 NOTE — Evaluation (Signed)
Physical Therapy Evaluation Patient Details Name: Larry Klein MRN: [id number] DOB: 1956/08/04 Today's Date: 01/10/2019   History of Present Illness  Pt is a 63 y.o. 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 fib with RVR, COPD who was admitted on 12/23/2018 with complaints of chest pain and dyspnea. Cardiology felt not a candidate for surgical revascularization. 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  Clinical Impression  Patient evaluated by Physical Therapy with no further acute PT needs identified. On PT evaluation, pt ambulating x 90 feet in room without physical assistance. HR 103, SpO2 98% on RA. Pt presents with decreased cardiovascular endurance. Pt has excellent awareness of symptoms, energy conservation, and endurance techniques. He has been mobilizing daily. Pt declines need for further follow up during inpatient hospital stay. All education has been completed and the patient has no further questions. No follow-up Physical Therapy or equipment needs. PT is signing off. Thank you for this referral.     Follow Up Recommendations No PT follow up    Equipment Recommendations  None recommended by PT    Recommendations for Other Services       Precautions / Restrictions Precautions Precautions: None Restrictions Weight Bearing Restrictions: No      Mobility  Bed Mobility Overal bed mobility: Independent                Transfers Overall transfer level: Independent Equipment used: None                Ambulation/Gait Ambulation/Gait assistance: Modified independent (Device/Increase time) Gait Distance (Feet): 90 Feet Assistive device: None Gait Pattern/deviations: Step-through pattern;Decreased stride length;Trunk flexed Gait velocity: Decreased   General Gait Details: Rigid posture with trunk slightly flexed. Fatigues easily and  requires rest breaks  Stairs            Wheelchair Mobility    Modified Rankin (Stroke Patients Only)       Balance Overall balance assessment: Mild deficits observed, not formally tested                                           Pertinent Vitals/Pain Pain Assessment: No/denies pain    Home Living Family/patient expects to be discharged to:: Private residence Living Arrangements: Alone Available Help at Discharge: Family;Available PRN/intermittently Type of Home: Mobile home Home Access: Stairs to enter Entrance Stairs-Rails: Right Entrance Stairs-Number of Steps: 2 Home Layout: One level   Additional Comments: Drives to HD TTS    Prior Function Level of Independence: Independent               Hand Dominance        Extremity/Trunk Assessment   Upper Extremity Assessment Upper Extremity Assessment: Overall WFL for tasks assessed    Lower Extremity Assessment Lower Extremity Assessment: LLE deficits/detail LLE Deficits / Details: L TMA    Cervical / Trunk Assessment Cervical / Trunk Assessment: Kyphotic  Communication   Communication: No difficulties  Cognition Arousal/Alertness: Awake/alert Behavior During Therapy: WFL for tasks assessed/performed Overall Cognitive Status: Within Functional Limits for tasks assessed  General Comments      Exercises     Assessment/Plan    PT Assessment Patent does not need any further PT services  PT Problem List         PT Treatment Interventions      PT Goals (Current goals can be found in the Care Plan section)  Acute Rehab PT Goals Patient Stated Goal: "get everything figured out." PT Goal Formulation: All assessment and education complete, DC therapy    Frequency     Barriers to discharge        Co-evaluation               AM-PAC PT "6 Clicks" Mobility  Outcome Measure Help needed turning from your back  to your side while in a flat bed without using bedrails?: None Help needed moving from lying on your back to sitting on the side of a flat bed without using bedrails?: None Help needed moving to and from a bed to a chair (including a wheelchair)?: None Help needed standing up from a chair using your arms (e.g., wheelchair or bedside chair)?: None Help needed to walk in hospital room?: None Help needed climbing 3-5 steps with a railing? : A Little 6 Click Score: 23    End of Session   Activity Tolerance: Patient tolerated treatment well Patient left: in bed;with call bell/phone within reach   PT Visit Diagnosis: Other abnormalities of gait and mobility (R26.89)    Time: 8366-2947 PT Time Calculation (min) (ACUTE ONLY): 17 min   Charges:   PT Evaluation $PT Eval Moderate Complexity: 1 Mod        Ellamae Sia, PT, DPT Acute Rehabilitation Services Pager [phone] Office [phone]  Willy Eddy 01/10/2019, 9:20 AM

## 2019-01-11 ENCOUNTER — Encounter (HOSPITAL_COMMUNITY)
Admission: EM | Disposition: A | Discharge status: Home / Self Care | Payer: Self-pay | Source: Other Acute Inpatient Hospital | Attending: Internal Medicine

## 2019-01-11 ENCOUNTER — Inpatient Hospital Stay (HOSPITAL_COMMUNITY): Payer: Medicare Other | Admitting: Certified Registered Nurse Anesthetist

## 2019-01-11 DIAGNOSIS — N185 Chronic kidney disease, stage 5: Secondary | ICD-10-CM

## 2019-01-11 HISTORY — PX: AV FISTULA PLACEMENT: SHX1204

## 2019-01-11 LAB — POCT I-STAT 4, (NA,K, GLUC, HGB,HCT)
Glucose, Bld: 109 mg/dL — ABNORMAL HIGH (ref 70–99)
HCT: 37 % — ABNORMAL LOW (ref 39.0–52.0)
Hemoglobin: 12.6 g/dL — ABNORMAL LOW (ref 13.0–17.0)
Potassium: 5.3 mmol/L — ABNORMAL HIGH (ref 3.5–5.1)
Sodium: 134 mmol/L — ABNORMAL LOW (ref 135–145)

## 2019-01-11 SURGERY — INSERTION OF ARTERIOVENOUS (AV) GORE-TEX GRAFT ARM
Anesthesia: Monitor Anesthesia Care | Site: Arm Upper | Laterality: Left

## 2019-01-11 MED ORDER — OXYCODONE HCL 5 MG PO TABS: 5.0000 mg | ORAL_TABLET | Freq: Once | ORAL | Status: DC | PRN

## 2019-01-11 MED ORDER — LIDOCAINE 2% (20 MG/ML) 5 ML SYRINGE
INTRAMUSCULAR | Status: AC
  Filled 2019-01-11: qty 5

## 2019-01-11 MED ORDER — HEPARIN SODIUM (PORCINE) 1000 UNIT/ML IJ SOLN
INTRAMUSCULAR | Status: DC | PRN
  Administered 2019-01-11: 5000 [IU] via INTRAVENOUS

## 2019-01-11 MED ORDER — HEPARIN SODIUM (PORCINE) 1000 UNIT/ML IJ SOLN
INTRAMUSCULAR | Status: AC
  Filled 2019-01-11: qty 1

## 2019-01-11 MED ORDER — CEFAZOLIN SODIUM 1 G IJ SOLR
INTRAMUSCULAR | Status: AC
  Filled 2019-01-11: qty 20

## 2019-01-11 MED ORDER — FENTANYL CITRATE (PF) 100 MCG/2ML IJ SOLN: 25.0000 ug | INTRAMUSCULAR | Status: DC | PRN

## 2019-01-11 MED ORDER — PROPOFOL 10 MG/ML IV BOLUS
INTRAVENOUS | Status: AC
  Filled 2019-01-11: qty 20

## 2019-01-11 MED ORDER — PROTAMINE SULFATE 10 MG/ML IV SOLN
INTRAVENOUS | Status: DC | PRN
  Administered 2019-01-11: 50 mg via INTRAVENOUS

## 2019-01-11 MED ORDER — PROPOFOL 10 MG/ML IV BOLUS
INTRAVENOUS | Status: DC | PRN
  Administered 2019-01-11: 20 mg via INTRAVENOUS

## 2019-01-11 MED ORDER — CHLORHEXIDINE GLUCONATE CLOTH 2 % EX PADS
6.0000 | MEDICATED_PAD | Freq: Every day | CUTANEOUS | Status: DC
  Administered 2019-01-12 – 2019-01-16 (×3): 6 via TOPICAL

## 2019-01-11 MED ORDER — PROPOFOL 1000 MG/100ML IV EMUL
INTRAVENOUS | Status: AC
  Filled 2019-01-11: qty 100

## 2019-01-11 MED ORDER — CEFAZOLIN SODIUM 1 G IJ SOLR
INTRAMUSCULAR | Status: AC
  Filled 2019-01-11: qty 60

## 2019-01-11 MED ORDER — FENTANYL CITRATE (PF) 250 MCG/5ML IJ SOLN
INTRAMUSCULAR | Status: AC
  Filled 2019-01-11: qty 5

## 2019-01-11 MED ORDER — DEXAMETHASONE SODIUM PHOSPHATE 10 MG/ML IJ SOLN
INTRAMUSCULAR | Status: AC
  Filled 2019-01-11: qty 1

## 2019-01-11 MED ORDER — OXYCODONE HCL 5 MG/5ML PO SOLN: 5.0000 mg | Freq: Once | ORAL | Status: DC | PRN

## 2019-01-11 MED ORDER — FENTANYL CITRATE (PF) 100 MCG/2ML IJ SOLN
INTRAMUSCULAR | Status: DC | PRN
  Administered 2019-01-11 (×2): 25 ug via INTRAVENOUS

## 2019-01-11 MED ORDER — SUCCINYLCHOLINE CHLORIDE 200 MG/10ML IV SOSY
PREFILLED_SYRINGE | INTRAVENOUS | Status: AC
  Filled 2019-01-11: qty 10

## 2019-01-11 MED ORDER — 0.9 % SODIUM CHLORIDE (POUR BTL) OPTIME
TOPICAL | Status: DC | PRN
  Administered 2019-01-11: 15:00:00 1000 mL

## 2019-01-11 MED ORDER — PROPOFOL 500 MG/50ML IV EMUL
INTRAVENOUS | Status: DC | PRN
  Administered 2019-01-11: 75 ug/kg/min via INTRAVENOUS

## 2019-01-11 MED ORDER — ONDANSETRON HCL 4 MG/2ML IJ SOLN
INTRAMUSCULAR | Status: AC
  Filled 2019-01-11: qty 2

## 2019-01-11 MED ORDER — ROCURONIUM BROMIDE 50 MG/5ML IV SOSY
PREFILLED_SYRINGE | INTRAVENOUS | Status: AC
  Filled 2019-01-11: qty 5

## 2019-01-11 MED ORDER — PHENYLEPHRINE 40 MCG/ML (10ML) SYRINGE FOR IV PUSH (FOR BLOOD PRESSURE SUPPORT)
PREFILLED_SYRINGE | INTRAVENOUS | Status: AC
  Filled 2019-01-11: qty 10

## 2019-01-11 MED ORDER — ONDANSETRON HCL 4 MG/2ML IJ SOLN: 4.0000 mg | Freq: Once | INTRAMUSCULAR | Status: DC | PRN

## 2019-01-11 MED ORDER — LIDOCAINE HCL (PF) 1 % IJ SOLN
INTRAMUSCULAR | Status: AC
  Filled 2019-01-11: qty 30

## 2019-01-11 MED ORDER — EPHEDRINE 5 MG/ML INJ
INTRAVENOUS | Status: AC
  Filled 2019-01-11: qty 10

## 2019-01-11 MED ORDER — SODIUM CHLORIDE 0.9 % IV SOLN
INTRAVENOUS | Status: DC | PRN
  Administered 2019-01-11: 16:00:00 30 ug/min via INTRAVENOUS

## 2019-01-11 MED ORDER — ONDANSETRON HCL 4 MG/2ML IJ SOLN
INTRAMUSCULAR | Status: DC | PRN
  Administered 2019-01-11: 4 mg via INTRAVENOUS

## 2019-01-11 MED ORDER — MIDAZOLAM HCL 2 MG/2ML IJ SOLN
INTRAMUSCULAR | Status: AC
  Filled 2019-01-11: qty 2

## 2019-01-11 MED ORDER — DEXAMETHASONE SODIUM PHOSPHATE 10 MG/ML IJ SOLN
INTRAMUSCULAR | Status: DC | PRN
  Administered 2019-01-11: 5 mg via INTRAVENOUS

## 2019-01-11 MED ORDER — SODIUM CHLORIDE 0.9 % IV SOLN
INTRAVENOUS | Status: DC | PRN
  Administered 2019-01-11: 500 mL

## 2019-01-11 MED ORDER — CEFAZOLIN SODIUM-DEXTROSE 2-3 GM-%(50ML) IV SOLR
INTRAVENOUS | Status: DC | PRN
  Administered 2019-01-11: 2 g via INTRAVENOUS

## 2019-01-11 MED ORDER — SODIUM CHLORIDE 0.9 % IV SOLN
INTRAVENOUS | Status: DC
  Administered 2019-01-11: 15:00:00 via INTRAVENOUS

## 2019-01-11 MED ORDER — PROTAMINE SULFATE 10 MG/ML IV SOLN
INTRAVENOUS | Status: AC
  Filled 2019-01-11: qty 5

## 2019-01-11 MED ORDER — LIDOCAINE HCL (PF) 1 % IJ SOLN
INTRAMUSCULAR | Status: DC | PRN
  Administered 2019-01-11: 9 mL

## 2019-01-11 MED ORDER — SODIUM CHLORIDE 0.9 % IV SOLN
INTRAVENOUS | Status: AC
  Filled 2019-01-11: qty 1.2

## 2019-01-11 SURGICAL SUPPLY — 39 items
ADH SKN CLS APL DERMABOND .7 (GAUZE/BANDAGES/DRESSINGS) ×1
AGENT HMST SPONGE THK3/8 (HEMOSTASIS)
ARMBAND PINK RESTRICT EXTREMIT (MISCELLANEOUS) ×3 IMPLANT
CANISTER SUCT 3000ML PPV (MISCELLANEOUS) ×2 IMPLANT
CANNULA VESSEL 3MM 2 BLNT TIP (CANNULA) ×2 IMPLANT
CLIP VESOCCLUDE MED 6/CT (CLIP) ×2 IMPLANT
CLIP VESOCCLUDE SM WIDE 6/CT (CLIP) ×2 IMPLANT
COVER PROBE W GEL 5X96 (DRAPES) ×1 IMPLANT
COVER SURGICAL LIGHT HANDLE (MISCELLANEOUS) ×1 IMPLANT
COVER WAND RF STERILE (DRAPES) ×2 IMPLANT
DECANTER SPIKE VIAL GLASS SM (MISCELLANEOUS) ×3 IMPLANT
DERMABOND ADVANCED (GAUZE/BANDAGES/DRESSINGS) ×1
DERMABOND ADVANCED .7 DNX12 (GAUZE/BANDAGES/DRESSINGS) ×1 IMPLANT
ELECT REM PT RETURN 9FT ADLT (ELECTROSURGICAL) ×2
ELECTRODE REM PT RTRN 9FT ADLT (ELECTROSURGICAL) ×1 IMPLANT
GLOVE BIO SURGEON STRL SZ 6.5 (GLOVE) ×2 IMPLANT
GLOVE BIO SURGEON STRL SZ7.5 (GLOVE) ×2 IMPLANT
GLOVE BIOGEL PI IND STRL 8 (GLOVE) IMPLANT
GLOVE BIOGEL PI INDICATOR 8 (GLOVE) ×1
GLOVE ECLIPSE 8.0 STRL XLNG CF (GLOVE) ×1 IMPLANT
GOWN STRL REUS W/ TWL LRG LVL3 (GOWN DISPOSABLE) ×3 IMPLANT
GOWN STRL REUS W/ TWL XL LVL3 (GOWN DISPOSABLE) IMPLANT
GOWN STRL REUS W/TWL LRG LVL3 (GOWN DISPOSABLE) ×6
GOWN STRL REUS W/TWL XL LVL3 (GOWN DISPOSABLE) ×2
HEMOSTAT SPONGE AVITENE ULTRA (HEMOSTASIS) IMPLANT
KIT BASIN OR (CUSTOM PROCEDURE TRAY) ×2 IMPLANT
KIT TURNOVER KIT B (KITS) ×2 IMPLANT
NS IRRIG 1000ML POUR BTL (IV SOLUTION) ×2 IMPLANT
PACK CV ACCESS (CUSTOM PROCEDURE TRAY) ×2 IMPLANT
PAD ARMBOARD 7.5X6 YLW CONV (MISCELLANEOUS) ×4 IMPLANT
SUT PROLENE 6 0 CC (SUTURE) ×4 IMPLANT
SUT PROLENE 7 0 BV 1 (SUTURE) ×2 IMPLANT
SUT VIC AB 3-0 SH 27 (SUTURE) ×2
SUT VIC AB 3-0 SH 27X BRD (SUTURE) ×2 IMPLANT
SUT VICRYL 4-0 PS2 18IN ABS (SUTURE) ×3 IMPLANT
SYR TOOMEY 50ML (SYRINGE) IMPLANT
TOWEL GREEN STERILE (TOWEL DISPOSABLE) ×2 IMPLANT
UNDERPAD 30X30 (UNDERPADS AND DIAPERS) ×2 IMPLANT
WATER STERILE IRR 1000ML POUR (IV SOLUTION) ×2 IMPLANT

## 2019-01-11 NOTE — Progress Notes (Signed)
Patient ID: Larry Klein, male   DOB: December 30, 1955, 63 y.o.   MRN: [id number] S: No new complaints  O:BP 98/83 (BP Location: Right Arm)   Pulse 75   Temp 97.7 F (36.5 C) (Oral)   Resp 18   Ht 5\' 11"  (1.803 m)   Wt 94.8 kg   SpO2 100%   BMI 29.15 kg/m   Intake/Output Summary (Last 24 hours) at 01/11/2019 1529 Last data filed at 01/11/2019 0700 Gross per 24 hour  Intake 220 ml  Output 2 ml  Net 218 ml   Intake/Output: I/O last 3 completed shifts: In: 31 [P.O.:820] Out: 2 [Stool:2]  Intake/Output this shift:  No intake/output data recorded. Weight change: 0.7 kg Gen: NAD CVS: no rub Resp: cta Abd: benign Ext: 1+ edema of lower extremities, mass on calf of left leg, +erythema of legs bilaterally   Recent Labs  Lab 01/06/19 1239 01/07/19 0315 01/07/19 0808 01/08/19 0351 01/09/19 0735 01/11/19 1517  NA 137 140 138  138 138 137 134*  K 3.7 3.9 4.0  4.0 4.1 4.2 5.3*  CL 98 102  --  100 98  --   CO2 23 24  --  22 23  --   GLUCOSE 149* 130*  --  115* 134* 109*  BUN 74* 42*  --  68* 89*  --   CREATININE 6.74* 5.35*  --  6.85* 8.02*  --   ALBUMIN 3.3* 3.3*  --   --  3.3*  --   CALCIUM 8.4* 8.3*  --  8.2* 8.4*  --   PHOS 2.5  --   --   --  4.8*  --   AST  --  32  --   --   --   --   ALT  --  64*  --   --   --   --    Liver Function Tests: Recent Labs  Lab 01/06/19 1239 01/07/19 0315 01/09/19 0735  AST  --  32  --   ALT  --  64*  --   ALKPHOS  --  117  --   BILITOT  --  0.8  --   PROT  --  5.7*  --   ALBUMIN 3.3* 3.3* 3.3*   No results for input(s): LIPASE, AMYLASE in the last 168 hours. No results for input(s): AMMONIA in the last 168 hours. CBC: Recent Labs  Lab 01/05/19 0432 01/06/19 0415 01/07/19 0315  01/08/19 0351 01/09/19 0735 01/11/19 1517  WBC 11.2* 12.2* 14.4*  --  13.8* 16.1*  --   NEUTROABS  --   --   --   --   --  13.5*  --   HGB 11.8* 11.8* 12.2*   < > 11.6* 12.1* 12.6*  HCT 38.4* 37.4* 39.0   < > 37.7* 40.1 37.0*  MCV 90.6 90.8 91.8   --  92.4 93.0  --   PLT 141* 136* 127*  --  107* 130*  --    < > = values in this interval not displayed.   Cardiac Enzymes: No results for input(s): CKTOTAL, CKMB, CKMBINDEX, TROPONINI in the last 168 hours. CBG: Recent Labs  Lab 01/06/19 2015  GLUCAP 189*    Iron Studies: No results for input(s): IRON, TIBC, TRANSFERRIN, FERRITIN in the last 72 hours. Studies/Results: Vas [place] Upper Ext Vein Mapping (pre-op Avf)  Result Date: 01/10/2019 UPPER EXTREMITY VEIN MAPPING  Indications: Pre-op dialysis access.  Examination Guidelines: A complete evaluation includes B-mode imaging, spectral  Doppler, color Doppler, and power Doppler as needed of all accessible portions of each vessel. Bilateral testing is considered an integral part of a complete examination. Limited examinations for reoccurring indications may be performed as noted. +-----------------+-------------+----------+------------+ Right Cephalic   Diameter (cm)Depth (cm)  Findings   +-----------------+-------------+----------+------------+ Prox upper arm       0.36        0.30                +-----------------+-------------+----------+------------+ Mid upper arm        0.34        0.31                +-----------------+-------------+----------+------------+ Dist upper arm       0.35        0.25                +-----------------+-------------+----------+------------+ Antecubital fossa    0.38        0.27                +-----------------+-------------+----------+------------+ Prox forearm                            IV, bandages +-----------------+-------------+----------+------------+ Mid forearm          0.27        0.20                +-----------------+-------------+----------+------------+ Wrist                0.25        0.22                +-----------------+-------------+----------+------------+ +-----------------+-------------+----------+--------+ Left Cephalic    Diameter (cm)Depth (cm)Findings  +-----------------+-------------+----------+--------+ Prox upper arm       0.40        0.23            +-----------------+-------------+----------+--------+ Mid upper arm        0.37        0.24            +-----------------+-------------+----------+--------+ Dist upper arm       0.50        0.27            +-----------------+-------------+----------+--------+ Antecubital fossa    0.52        0.18            +-----------------+-------------+----------+--------+ Prox forearm         0.50        0.41            +-----------------+-------------+----------+--------+ Mid forearm          0.46        0.31            +-----------------+-------------+----------+--------+ Wrist                0.42        0.24            +-----------------+-------------+----------+--------+ *See table(s) above for measurements and observations.  Diagnosing physician: Monica Martinez MD Electronically signed by Monica Martinez MD on 01/10/2019 at 11:56:32 AM.    Final    . [MAR Hold] albuterol  2.5 mg Nebulization BID  . [MAR Hold] amiodarone  200 mg Oral Daily  . [MAR Hold] aspirin  81 mg Oral Daily  . [MAR Hold] atorvastatin  80 mg Oral q1800  . [MAR Hold] Chlorhexidine Gluconate Cloth  6 each Topical  Q49  . [MAR Hold] gabapentin  300 mg Oral QHS  . [MAR Hold] isosorbide mononitrate  30 mg Oral Daily  . [MAR Hold] metoprolol succinate  50 mg Oral Daily  . [MAR Hold] multivitamin  1 tablet Oral Daily  . [MAR Hold] pantoprazole  40 mg Oral QAC breakfast  . [MAR Hold] predniSONE  30 mg Oral Q breakfast  . [MAR Hold] sodium chloride flush  3 mL Intravenous Q12H  . [MAR Hold] sodium chloride flush  3 mL Intravenous Q12H  . [MAR Hold] sodium chloride flush  3 mL Intravenous Q12H  . [MAR Hold] umeclidinium-vilanterol  1 puff Inhalation Daily    BMET    Component Value Date/Time   NA 134 (L) 01/11/2019 1517   K 5.3 (H) 01/11/2019 1517   CL 98 01/09/2019 0735   CO2 23 01/09/2019 0735    GLUCOSE 109 (H) 01/11/2019 1517   BUN 89 (H) 01/09/2019 0735   CREATININE 8.02 (H) 01/09/2019 0735   CALCIUM 8.4 (L) 01/09/2019 0735   GFRNONAA 6 (L) 01/09/2019 0735   GFRAA 7 (L) 01/09/2019 0735   CBC    Component Value Date/Time   WBC 16.1 (H) 01/09/2019 0735   RBC 4.31 01/09/2019 0735   HGB 12.6 (L) 01/11/2019 1517   HCT 37.0 (L) 01/11/2019 1517   PLT 130 (L) 01/09/2019 0735   MCV 93.0 01/09/2019 0735   MCH 28.1 01/09/2019 0735   MCHC 30.2 01/09/2019 0735   RDW 19.6 (H) 01/09/2019 0735   LYMPHSABS 0.8 01/09/2019 0735   MONOABS 1.2 (H) 01/09/2019 0735   EOSABS 0.0 01/09/2019 0735   BASOSABS 0.0 01/09/2019 0735   Dialysis Orders: TTS at  4h400/800 92.5kg 3K/2.25bath P2 LIJTDC Heparin none -Calcitriol 0.5 mcg PO TIW  Assessment and Plan: 1. ESRD- cont with HD q TTS. HD in am tomorrow.  2. Edema- continue to challenge edw as tolerated. Pt wants to UF to 91.5kg tues.  3. PAD- s/p angiogram, no role for intervention per [facility] 4. Thrombocytopenia- due to ITP responding to steroids.  Per [name] will decrease to 30 mg and taper as an outpatient. 5. CHF- volume improving with HD and UF 6. Left calf mass- worrisome for sarcoma and pending biopsy with Dr. Magda Bernheim at [facility] 7. Vascular access- for AVF/AVG on Monday and appreciate [facility] assistance.  [facility] 01/11/2019, 3:31 PM

## 2019-01-11 NOTE — Anesthesia Procedure Notes (Signed)
Procedure Name: MAC Date/Time: 01/11/2019 3:40 PM Performed by: Trinna Post., CRNA Pre-anesthesia Checklist: Patient identified, Emergency Drugs available, Suction available, Patient being monitored and Timeout performed Patient Re-evaluated:Patient Re-evaluated prior to induction Oxygen Delivery Method: Simple face mask Preoxygenation: Pre-oxygenation with 100% oxygen Induction Type: IV induction Placement Confirmation: positive ETCO2

## 2019-01-11 NOTE — Care Management Important Message (Signed)
Important Message  Patient Details  Name: Larry Klein MRN: [id number] Date of Birth: August 25, 1956   Medicare Important Message Given:  Yes    Kiwan [address] 01/11/2019, 10:49 AM

## 2019-01-11 NOTE — Plan of Care (Signed)
  Problem: Safety: Goal: Ability to remain free from injury will improve Outcome: Progressing   Problem: Activity: Goal: Capacity to carry out activities will improve Outcome: Progressing   

## 2019-01-11 NOTE — Transfer of Care (Signed)
Immediate Anesthesia Transfer of Care Note  Patient: Larry Klein  Procedure(s) Performed: Creation Brachiocephalic Arteriovenous Fistula (Left Arm Upper)  Patient Location: PACU  Anesthesia Type:MAC  Level of Consciousness: drowsy  Airway & Oxygen Therapy: Patient Spontanous Breathing and Patient connected to nasal cannula oxygen  Post-op Assessment: Report given to RN and Post -op Vital signs reviewed and stable  Post vital signs: Reviewed and stable  Last Vitals:  Vitals Value Taken Time  BP 92/75 01/11/2019  5:08 PM  Temp    Pulse 53 01/11/2019  5:09 PM  Resp 9 01/11/2019  5:09 PM  SpO2 97 % 01/11/2019  5:09 PM  Vitals shown include unvalidated device data.  Last Pain:  Vitals:   01/11/19 1220  TempSrc: Oral  PainSc:       Patients Stated Pain Goal: 0 (58/68/25 7493)  Complications: No apparent anesthesia complications

## 2019-01-11 NOTE — Progress Notes (Signed)
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 fib with RVR, chronic thrombocytopenia, anemia, COPD, HTN, admitted on 12/23/2018 with complaints of chest pain and dyspnea on exertion, initially presented to [facility] ED where CTA chest was negative for PE, transferred to [facility] for cardiology evaluation.  Cardiology evaluated for unstable angina, was on heparin drip, felt not a candidate for surgical or percutaneous revascularization (prior CABG, thrombocytopenia), added Imdur and signed off.  COVID testing negative.  Nephrology consulting for HD needs.Vascular surgery consulted for right foot pain and plan arteriogram 4/21 pending further improvement in thrombocytopenia.  Hematology consulted for thrombocytopenia.   Assessment & Plan:   Principal Problem:   Chest pain Active Problems:   Dyspnea   ESRD (end stage renal disease) on dialysis [facility])   Atrial fibrillation with RVR (HCC)   Acute on chronic systolic heart failure (HCC)   Cardiomyopathy (HCC)   PVD (peripheral vascular disease) ([place])   Coronary artery disease with unstable angina Previously not a candidate for intervention or anti platelet agets due to thrombocytopenia.  Cardiology re consulted as his platelets improved and he underwent cardiac cath, and recommend to continue medical therapy and better volume management with HD.  Started the patient on aspirin 81 mg daily.    Acute on chronic systolic heart failure Continue with volume management with hemodialysis Continue with beta-blocker, 25 mg daily and Imdur 30 mg daily .  Will need better volume management with HD.  Cardiology recommend repeat echocardiogram in 3 months.  Defer ICD  placement to cardiology.    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 Hematology consulted recommended pulse  Decadron therapy and outpatient follow-up.  Platelets have improved dramatically.  Currently on prednisone 30 mg daily.    Persistent atrial fibrillation Rate controlled with amiodarone and Toprol at this time, patient is not on anticoagulation due to thrombocytopenia.   Mild transaminitis due to CHF Patient denies any nausea vomiting or abdominal pain at this time.  Acute hepatitis panel negative. Repeat liver enzymes show alk phos of 117, AST OF 32 and ALT OF 64.  No further work up needed.    End-stage renal disease on hemodialysis Nephrology on board and appreciate recommendations.    Left Lower extremity swelling status post left TMA and right leg swelling Probably from peripheral vascular disease.  No DVT on Venous duplex.  Vascular surgery consulted, underwent aortogram and bilateral lower extremity arteriogram with runoff. Pt has occluded anterior tibial arteries bilateral.   S/P right lower extremity arteriogram - in line flow via PT and as per vascular surgery,  no role for revascularization.   Scheduled for left arm AVF vs graft today with Dr. Oneida Alar.    Vein mapping done today.  Plan for AVG today, no other complaints.   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  He will need referral to Orthopedic oncology at [place] for further evaluation. Oncology on board and will refer the patient.  Pain control.    COPD No wheezing heard continue with nebs as needed.   DVT prophylaxis: none. scd's couldn't be applied due to leg tenderness and swelling.  Code Status: full code.  Family Communication: none at bedside.  Disposition Plan: possible d/c after AVG   Consultants:  Dr Oneida Alar with vascular surgery.  Nephrology with Dr Marval Regal.  Hematology Dr Benay Spice.    Procedures:  Arteriogram  scheduled for Friday.   Antimicrobials: None.   Subjective: No chest pain or sob.  No new complaints. Getting vein mapping.   Objective: Vitals:   01/11/19 0011 01/11/19 0612 01/11/19 0822 01/11/19 0850  BP:  (!) 126/99  118/69  Pulse: 82 87  76  Resp: 16 18    Temp:  (!) 97.5 F (36.4 C)    TempSrc:  Oral    SpO2: 97% 100% 97% 99%  Weight:  94.8 kg    Height:        Intake/Output Summary (Last 24 hours) at 01/11/2019 1221 Last data filed at 01/11/2019 0700 Gross per 24 hour  Intake 220 ml  Output 2 ml  Net 218 ml   Filed Weights   01/09/19 1115 01/10/19 0615 01/11/19 0612  Weight: 92.4 kg 93.1 kg 94.8 kg    Examination:  General exam: not in distress , calm and comfortable.  Respiratory system: diminished at bases, no wheezing or rhonchi.  Cardiovascular system: S1 & S2 heard, irregular. No JVD.  Gastrointestinal system: Abdomen is nondistended, soft and nontender. . Normal bowel sounds heard. Central nervous system: Alert and oriented. Non focal.  Extremities: bilateral lower extremity swelling and tenderness present. Left metatarsal amputation.  Skin: No rashes, lesions or ulcers Psychiatry: calm today.     Data Reviewed: I have personally reviewed following labs and imaging studies  CBC: Recent Labs  Lab 01/05/19 0432 01/06/19 0415 01/07/19 0315 01/07/19 0808 01/08/19 0351 01/09/19 0735  WBC 11.2* 12.2* 14.4*  --  13.8* 16.1*  NEUTROABS  --   --   --   --   --  13.5*  HGB 11.8* 11.8* 12.2* 13.6   13.6 11.6* 12.1*  HCT 38.4* 37.4* 39.0 40.0   40.0 37.7* 40.1  MCV 90.6 90.8 91.8  --  92.4 93.0  PLT 141* 136* 127*  --  107* 233*   Basic Metabolic Panel: Recent Labs  Lab 01/06/19 1239 01/07/19 0315 01/07/19 0808 01/08/19 0351 01/09/19 0735  NA 137 140 138   138 138 137  K 3.7 3.9 4.0   4.0 4.1 4.2  CL 98 102  --  100 98  CO2 23 24  --  22 23  GLUCOSE 149* 130*  --  115* 134*  BUN 74* 42*  --  68* 89*  CREATININE 6.74* 5.35*  --  6.85* 8.02*    CALCIUM 8.4* 8.3*  --  8.2* 8.4*  PHOS 2.5  --   --   --  4.8*   GFR: Estimated Creatinine Clearance: 10.9 mL/min (A) (by C-G formula based on SCr of 8.02 mg/dL (H)). Liver Function Tests: Recent Labs  Lab 01/06/19 1239 01/07/19 0315 01/09/19 0735  AST  --  32  --   ALT  --  64*  --   ALKPHOS  --  117  --   BILITOT  --  0.8  --   PROT  --  5.7*  --   ALBUMIN 3.3* 3.3* 3.3*   No results for input(s): LIPASE, AMYLASE in the last 168 hours. No results for input(s): AMMONIA in the last 168 hours. Coagulation Profile: No results for input(s): INR, PROTIME in the last 168 hours. Cardiac Enzymes: No results for input(s): CKTOTAL, CKMB, CKMBINDEX, TROPONINI in the last 168 hours. BNP (last 3 results)  No results for input(s): PROBNP in the last 8760 hours. HbA1C: No results for input(s): HGBA1C in the last 72 hours. CBG: Recent Labs  Lab 01/06/19 2015  GLUCAP 189*   Lipid Profile: No results for input(s): CHOL, HDL, LDLCALC, TRIG, CHOLHDL, LDLDIRECT in the last 72 hours. Thyroid Function Tests: No results for input(s): TSH, T4TOTAL, FREET4, T3FREE, THYROIDAB in the last 72 hours. Anemia Panel: No results for input(s): VITAMINB12, FOLATE, FERRITIN, TIBC, IRON, RETICCTPCT in the last 72 hours. Sepsis Labs: No results for input(s): PROCALCITON, LATICACIDVEN in the last 168 hours.  Recent Results (from the past 240 hour(s))  Surgical pcr screen     Status: None   Collection Time: 01/10/19  2:15 PM  Result Value Ref Range Status   MRSA, PCR NEGATIVE NEGATIVE Final   Staphylococcus aureus NEGATIVE NEGATIVE Final    Comment: (NOTE) The Xpert SA Assay (FDA approved for NASAL specimens in patients 80 years of age and older), is one component of a comprehensive surveillance program. It is not intended to diagnose infection nor to guide or monitor treatment. Performed at [facility], [place] [address], [place], [place] [address]          Radiology Studies: Vas [place]  Upper Ext Vein Mapping (pre-op Avf)  Result Date: 01/10/2019 UPPER EXTREMITY VEIN MAPPING  Indications: Pre-op dialysis access.  Examination Guidelines: A complete evaluation includes B-mode imaging, spectral Doppler, color Doppler, and power Doppler as needed of all accessible portions of each vessel. Bilateral testing is considered an integral part of a complete examination. Limited examinations for reoccurring indications may be performed as noted. +-----------------+-------------+----------+------------+  Right Cephalic    Diameter (cm) Depth (cm)   Findings    +-----------------+-------------+----------+------------+  Prox upper arm        0.36         0.30                  +-----------------+-------------+----------+------------+  Mid upper arm         0.34         0.31                  +-----------------+-------------+----------+------------+  Dist upper arm        0.35         0.25                  +-----------------+-------------+----------+------------+  Antecubital fossa     0.38         0.27                  +-----------------+-------------+----------+------------+  Prox forearm                               IV, bandages  +-----------------+-------------+----------+------------+  Mid forearm           0.27         0.20                  +-----------------+-------------+----------+------------+  Wrist                 0.25         0.22                  +-----------------+-------------+----------+------------+ +-----------------+-------------+----------+--------+  Left Cephalic     Diameter (cm) Depth (cm) Findings  +-----------------+-------------+----------+--------+  Prox upper arm        0.40  0.23              +-----------------+-------------+----------+--------+  Mid upper arm         0.37         0.24              +-----------------+-------------+----------+--------+  Dist upper arm        0.50         0.27              +-----------------+-------------+----------+--------+  Antecubital fossa      0.52         0.18              +-----------------+-------------+----------+--------+  Prox forearm          0.50         0.41              +-----------------+-------------+----------+--------+  Mid forearm           0.46         0.31              +-----------------+-------------+----------+--------+  Wrist                 0.42         0.24              +-----------------+-------------+----------+--------+ *See table(s) above for measurements and observations.  Diagnosing physician: Monica Martinez MD Electronically signed by Monica Martinez MD on 01/10/2019 at 11:56:32 AM.    Final         Scheduled Meds:  albuterol  2.5 mg Nebulization BID   amiodarone  200 mg Oral Daily   aspirin  81 mg Oral Daily   atorvastatin  80 mg Oral q1800   Chlorhexidine Gluconate Cloth  6 each Topical Q0600   gabapentin  300 mg Oral QHS   isosorbide mononitrate  30 mg Oral Daily   metoprolol succinate  50 mg Oral Daily   multivitamin  1 tablet Oral Daily   pantoprazole  40 mg Oral QAC breakfast   predniSONE  30 mg Oral Q breakfast   sodium chloride flush  3 mL Intravenous Q12H   sodium chloride flush  3 mL Intravenous Q12H   sodium chloride flush  3 mL Intravenous Q12H   umeclidinium-vilanterol  1 puff Inhalation Daily   Continuous Infusions:  sodium chloride     sodium chloride     sodium chloride       LOS: 18 days    Time spent: 26 minutes.     Hosie Poisson, MD [facility] Pager [phone]  If 7PM-7AM, please contact night-coverage [email or web address] Password [email or web address] 01/11/2019, 12:21 PM

## 2019-01-11 NOTE — Discharge Instructions (Signed)
° °  [facility]  Discharge Instructions  AV Fistula or Graft Surgery for Dialysis Access  Please refer to the following instructions for your post-procedure care. Your surgeon or physician assistant will discuss any changes with you.  Activity  You may drive the day following your surgery, if you are comfortable and no longer taking prescription pain medication. Resume full activity as the soreness in your incision resolves.  Bathing/Showering  You may shower after you go home. Keep your incision dry for 48 hours. Do not soak in a bathtub, hot tub, or swim until the incision heals completely. You may not shower if you have a hemodialysis catheter.  Incision Care  Clean your incision with mild soap and water after 48 hours. Pat the area dry with a clean towel. You do not need a bandage unless otherwise instructed. Do not apply any ointments or creams to your incision. You may have skin glue on your incision. Do not peel it off. It will come off on its own in about one week. Your arm may swell a bit after surgery. To reduce swelling use pillows to elevate your arm so it is above your heart. Your doctor will tell you if you need to lightly wrap your arm with an ACE bandage.  Diet  Resume your normal diet. There are not special food restrictions following this procedure. In order to heal from your surgery, it is CRITICAL to get adequate nutrition. Your body requires vitamins, minerals, and protein. Vegetables are the best source of vitamins and minerals. Vegetables also provide the perfect balance of protein. Processed food has little nutritional value, so try to avoid this.  Medications  Resume taking all of your medications. If your incision is causing pain, you may take over-the counter pain relievers such as acetaminophen (Tylenol). If you were prescribed a stronger pain medication, please be aware these medications can cause nausea and constipation. Prevent  nausea by taking the medication with a snack or meal. Avoid constipation by drinking plenty of fluids and eating foods with high amount of fiber, such as fruits, vegetables, and grains.  Do not take Tylenol if you are taking prescription pain medications.  Follow up Your surgeon may want to see you in the office following your access surgery. If so, this will be arranged at the time of your surgery.  Please call us immediately for any of the following conditions:  Increased pain, redness, drainage (pus) from your incision site Fever of 101 degrees or higher Severe or worsening pain at your incision site Hand pain or numbness.  Reduce your risk of vascular disease:  Stop smoking. If you would like help, call [facility] at 1-800-QUIT-NOW (825) 349-3544) or [place] at [place] your cholesterol Maintain a desired weight Control your diabetes Keep your blood pressure down  Dialysis  It will take several weeks to several months for your new dialysis access to be ready for use. Your surgeon will determine when it is okay to use it. Your nephrologist will continue to direct your dialysis. You can continue to use your Permcath until your new access is ready for use.   01/11/2019 Larry Klein [id number] 06-21-1956  Surgeon(s): Elam Dutch, MD  Procedure(s): Creation Left Brachiocephalic Arteriovenous Fistula  x Do not stick fistula for 12 weeks    If you have any questions, please call the office at [phone].

## 2019-01-11 NOTE — TOC Progression Note (Signed)
Transition of Care [facility]) - Progression Note    Patient Details  Name: Larry Klein MRN: [id number] Date of Birth: 07-22-1956  Transition of [facility]) CM/SW Contact  Sherrilyn Rist Phone Number: [phone] 01/11/2019, 8:28 AM  Clinical Narrative:    CM following for progression of care; for AVG placement today; ESRD - TTS at [facility]; has medical insurance with Medicare / Medicaid with prescription drug coverage; [facility]  12/09/2018 - Clinical Narrative:  Pt presented for Atrial Fib RVR- Initiated on IV Heparin gtt. CM will monitor for oral anticoagulation needs. Pt resides in [place]. Hx ESRD on HD- (TTS schedule) states he has transportation. Patient uses [facility]. Patient states he gets medications without any problems. Has secondary Medicaid. CM did discuss PCP needs and patient states he will set up via insurance. CM will continue to monitor for any transition of care needs. Arville Lime RN CM   Expected Discharge Plan: Home/Self Care Barriers to Discharge: No Barriers Identified  Expected Discharge Plan and Services Expected Discharge Plan: Home/Self Care In-house Referral: NA Discharge Planning Services: CM Consult Post Acute Care Choice: NA Living arrangements for the past 2 months: Single Family Home Expected Discharge Date: 01/08/19               DME Arranged: N/A DME Agency: NA       HH Arranged: NA HH Agency: NA         Social Determinants of Health (SDOH) Interventions    Readmission Risk Interventions No flowsheet data found.

## 2019-01-11 NOTE — Anesthesia Postprocedure Evaluation (Signed)
Anesthesia Post Note  Patient: Larry Klein  Procedure(s) Performed: Creation Brachiocephalic Arteriovenous Fistula (Left Arm Upper)     Patient location during evaluation: PACU Anesthesia Type: MAC Level of consciousness: awake and alert Pain management: pain level controlled Vital Signs Assessment: post-procedure vital signs reviewed and stable Respiratory status: spontaneous breathing, nonlabored ventilation, respiratory function stable and patient connected to nasal cannula oxygen Cardiovascular status: stable and blood pressure returned to baseline Postop Assessment: no apparent nausea or vomiting Anesthetic complications: no    Last Vitals:  Vitals:   01/11/19 1949 01/11/19 2009  BP: 101/74   Pulse: 66   Resp: 18   Temp: 36.6 C   SpO2: 97% 96%    Last Pain:  Vitals:   01/11/19 1949  TempSrc: Oral  PainSc:                  Atheena Spano COKER

## 2019-01-11 NOTE — Anesthesia Preprocedure Evaluation (Signed)
Anesthesia Evaluation  Patient identified by MRN, date of birth, ID band Patient awake    Reviewed: Allergy & Precautions, NPO status , Patient's Chart, lab work & pertinent test results  Airway Mallampati: II  TM Distance: >3 FB     Dental  (+) Edentulous Upper, Edentulous Lower   Pulmonary former smoker,     + decreased breath sounds      Cardiovascular hypertension,  Rhythm:Irregular Rate:Normal     Neuro/Psych    GI/Hepatic   Endo/Other    Renal/GU      Musculoskeletal   Abdominal   Peds  Hematology   Anesthesia Other Findings   Reproductive/Obstetrics                             Anesthesia Physical Anesthesia Plan  ASA: III  Anesthesia Plan: MAC   Post-op Pain Management:    Induction: Intravenous  PONV Risk Score and Plan: Ondansetron and Dexamethasone  Airway Management Planned: Natural Airway and Simple Face Mask  Additional Equipment:   Intra-op Plan:   Post-operative Plan:   Informed Consent: I have reviewed the patients History and Physical, chart, labs and discussed the procedure including the risks, benefits and alternatives for the proposed anesthesia with the patient or authorized representative who has indicated his/her understanding and acceptance.       Plan Discussed with: CRNA and Anesthesiologist  Anesthesia Plan Comments:         Anesthesia Quick Evaluation

## 2019-01-11 NOTE — Progress Notes (Addendum)
Procedure: Left Brachial Cephalic AV fistula  Preop: ESRD  Postop: ESRD  Anesthesia: General  Assistant: Leontine Locket PA-c  Findings: 3.5 mm cephalic vein  Procedure: After obtaining informed consent, the patient was taken to the operating room.  After adequate sedation, the left upper extremity was prepped and draped in usual sterile fashion.  Ultrasound was used to identify the course of the left cephalic vein.  Local anesthetic was placed in the left antecubital space A transverse incision was then made near the antecubital crease the left arm. The incision was carried into the subcutaneous tissues down to level of the cephalic vein. The cephalic vein was approximately 3.5 mm in diameter. It was of good quality. This was dissected free circumferentially and small side branches ligated and divided between silk ties or clips. Next the brachial artery was dissected free in the medial portion of the incision. The artery was  3-4 mm in diameter. The vessel loops were placed proximal and distal to the planned site of arteriotomy. The patient was given 5000 units of intravenous heparin. After appropriate circulation time, the vessel loops were used to control the artery. A longitudinal opening was made in the brachial artery.  The vein was ligated distally with a 2-0 silk tie. The vein was controlled proximally with a fine bulldog clamp. The vein was then swung over to the artery and sewn end of vein to side of artery using a running 7-0 Prolene suture. Just prior to completion of the anastomosis, everything was forebled back bled and thoroughly flushed. The anastomosis was secured, vessel loops released, and there was a palpable thrill in the fistula immediately. After hemostasis was obtained, the subcutaneous tissues were reapproximated using a running 3-0 Vicryl suture. The skin was then closed with a 4 0 Vicryl subcuticular stitch. Dermabond was applied to the skin incision.  The patient had a  palpable radial pulse at the end of the case.  Ruta Hinds, MD [facility] [place] Office: [phone] Pager: [phone]

## 2019-01-12 ENCOUNTER — Other Ambulatory Visit: Payer: Self-pay

## 2019-01-12 ENCOUNTER — Encounter (HOSPITAL_COMMUNITY): Payer: Self-pay | Admitting: Vascular Surgery

## 2019-01-12 DIAGNOSIS — R110 Nausea: Secondary | ICD-10-CM

## 2019-01-12 LAB — CBC
HCT: 36.3 % — ABNORMAL LOW (ref 39.0–52.0)
Hemoglobin: 11.3 g/dL — ABNORMAL LOW (ref 13.0–17.0)
MCH: 28 pg (ref 26.0–34.0)
MCHC: 31.1 g/dL (ref 30.0–36.0)
MCV: 90.1 fL (ref 80.0–100.0)
Platelets: 101 10*3/uL — ABNORMAL LOW (ref 150–400)
RBC: 4.03 MIL/uL — ABNORMAL LOW (ref 4.22–5.81)
RDW: 19.3 % — ABNORMAL HIGH (ref 11.5–15.5)
WBC: 16.2 10*3/uL — ABNORMAL HIGH (ref 4.0–10.5)
nRBC: 0 % (ref 0.0–0.2)

## 2019-01-12 LAB — BASIC METABOLIC PANEL
Anion gap: 16 — ABNORMAL HIGH (ref 5–15)
BUN: 91 mg/dL — ABNORMAL HIGH (ref 8–23)
CO2: 18 mmol/L — ABNORMAL LOW (ref 22–32)
Calcium: 8.3 mg/dL — ABNORMAL LOW (ref 8.9–10.3)
Chloride: 101 mmol/L (ref 98–111)
Creatinine, Ser: 8.67 mg/dL — ABNORMAL HIGH (ref 0.61–1.24)
GFR calc Af Amer: 7 mL/min — ABNORMAL LOW (ref >60)
GFR calc non Af Amer: 6 mL/min — ABNORMAL LOW (ref >60)
Glucose, Bld: 130 mg/dL — ABNORMAL HIGH (ref 70–99)
Potassium: 5.6 mmol/L — ABNORMAL HIGH (ref 3.5–5.1)
Sodium: 135 mmol/L (ref 135–145)

## 2019-01-12 LAB — GLUCOSE, CAPILLARY: Glucose-Capillary: 129 mg/dL — ABNORMAL HIGH (ref 70–99)

## 2019-01-12 MED ORDER — DIAZEPAM 2 MG PO TABS
2.0000 mg | ORAL_TABLET | Freq: Once | ORAL | Status: AC
  Administered 2019-01-12: 2 mg via ORAL
  Filled 2019-01-12: qty 1

## 2019-01-12 MED ORDER — ONDANSETRON HCL 4 MG/2ML IJ SOLN
INTRAMUSCULAR | Status: AC
  Filled 2019-01-12: qty 2

## 2019-01-12 MED ORDER — PREDNISONE 20 MG PO TABS
20.0000 mg | ORAL_TABLET | Freq: Every day | ORAL | Status: DC
  Administered 2019-01-13 – 2019-01-21 (×9): 20 mg via ORAL
  Filled 2019-01-12 (×9): qty 1

## 2019-01-12 NOTE — Progress Notes (Signed)
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 fib with RVR, chronic thrombocytopenia, anemia, COPD, HTN, admitted on 12/23/2018 with complaints of chest pain and dyspnea on exertion, initially presented to [facility] ED where CTA chest was negative for PE, transferred to [facility] for cardiology evaluation. Cardiology evaluated for unstable angina, was on heparin drip, felt not a candidate for surgical or percutaneous revascularization (prior CABG, thrombocytopenia), added Imdur and signed off. COVID testing negative. Nephrology consulting for HD needs.Vascular surgery consulted for right foot pain.Hematology consulted for thrombocytopenia. Patient was placed on a steroid trial with improvement on the platelet count.  Patient underwent cardiac cath and now on medical management, also underwent lower extremity aortogram, and no role of revascularization.  Subjective: Sitting on the chair in the corner of the room, trying to vomit.  Nauseous.  Reports he is deferring dialysis this morning. Complains of ongoing lower extremity edema. On room air. Denies chest pain abdominal pain shortness of breath  Assessment & Plan:   CAD with unstable angina: Status post cardiac cath, plan is to continue medical therapy:asa,imdur,toprol, lipitor. Previously was not a candidate for intervention or antiplatelet agents due to thrombocytopenia.Patient started on aspirin 81 mg. cardiology signed off.  Acute on chronic systolic heart failure, LVEF 20 to 25% ECHO 3/24: Continue volume management with dialysis,, cont toprol. Appreciate cardiology input, cards signed off, needs follow-up echo in 3 months. previous cardiology in  [place] discussed getting a device in 2019 not sure about long-term prognosis currently.  Leg muscle is being evaluated.  PAD s/p aortic stent graft, with right leg pain, leg swelling- NO DVT. S/p aortogram and bilateral lower extremity runoff noted to have occluded anterior tibial arteries b/l. S/P right lower extremity arteriogram - in line flow via PT and as per vascular surgery,  no role for revascularization. Cont asa, statins,  S/p LUE AVF 4/.27, patent, vas s/o   Permanent atrial fibrillation: Rate controlled chads 2 vas score of 3, continue amiodarone, beta-blocker.  Previously no anticoagulation due to low platelet.  needs to follow-up with outpatient.   ESRD on HD TTS w mild hyperkalemia:HD for today.  Appreciate nephrology input.  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  Appreciate hematology input and currently on steroid trial since 4/14 and platelet improving. Tapering steroid to 20 mg daily, recommendation for slow taper as outpatient, with outpatient follow-up labs at [place] center. Recent Labs  Lab 01/06/19 0415 01/07/19 0315 01/08/19 0351 01/09/19 0735 01/12/19 0330  PLT 136* 127* 107* 130* 101*   Mild transaminitis in the setting of CHF.  Overall lfts stable.acute viral hepatitis panel was negative.  Left leg mass without vascularity MRI suggest malignant process will need referral to orthopedic oncology at [facility] for further evaluation, oncology on board. Cont pain control.  COPD stable no wheezing.  Nausea  this morning : continue supportive care, diet as tolerated   Leukocytosis WBC went up today.  Status post AVF procedure yesterday BUT was given Ancef.  Repeat CBC in the morning.  Hold off on antibiotics, as he is afebrile could be related to steroid/reactive  DVT prophylaxis: No SCD due to leg pain.  Not on pharmacologic anticoagulation due to thrombocytopenia. Code Status: Full code Family  Communication: Discussed plan of care with  the patient in detail, verbalized understanding.  Does not feel ready for home yet.   Disposition Plan: Patient reluctant to go today due to nausea/vomiting and also reports son not at home. Dialysis pending. Anticipating discharge soon.  Consultants:  Dr Oneida Alar with vascular surgery.  Nephrology with Dr Marval Regal.  Hematology Dr Benay Spice. Procedures:  Arteriogram scheduled for friday  4/27 LUE AVF  RIGHT/LEFT HEART CATH AND CORONARY/GRAFT ANGIOGRAPH  Prox RCA to Mid RCA lesion is 100% stenosed.  Ost LM to Mid LM lesion is 50% stenosed.  Ost Cx lesion is 95% stenosed.  Prox LAD lesion is 100% stenosed.  Ost 1st Diag to 1st Diag lesion is 70% stenosed.  Origin to Insertion lesion before 1st Diag is 100% stenoseD  12/08/2018 Limited echo  LVEF 20-25%, moderately dilated LV, severe global hypokinesis, mildly reduced RV systolic function, moderate biatrial enlargment, moderate to severe eccentric MR, mild TR, aortic valve sclerosis, dilated IVC Antimicrobials: Anti-infectives (From admission, onward)   None       Objective: Vitals:   01/12/19 0237 01/12/19 0625 01/12/19 0714 01/12/19 0718  BP: 114/78 101/72    Pulse: 83 83    Resp: 18 18    Temp: (!) 97.5 F (36.4 C) 98 F (36.7 C)    TempSrc:  Oral    SpO2: 100% 99% 99% 98%  Weight:  96.1 kg    Height:        Intake/Output Summary (Last 24 hours) at 01/12/2019 0917 Last data filed at 01/11/2019 2300 Gross per 24 hour  Intake 780 ml  Output 15 ml  Net 765 ml   Filed Weights   01/11/19 0612 01/11/19 1506 01/12/19 0625  Weight: 94.8 kg 94.8 kg 96.1 kg   Weight change: 0 kg  Body mass index is 29.55 kg/m.  Intake/Output from previous day: 04/27 0701 - 04/28 0700 In: 780 [P.O.:480; I.V.:300] Out: 15 [Blood:15] Intake/Output this shift: No intake/output data recorded.  Examination:  General exam: Appears nauseous, obese, in mild distress  HEENT:PERRL,Oral mucosa moist, Ear/Nose normal on gross  exam Respiratory system: Bilateral equal air entry, normal vesicular breath sounds, no wheezes or crackles  Cardiovascular system: S1 & S2 heard,No JVD, murmurs. Gastrointestinal system: Abdomen is  soft, non tender, non distended, BS +  Nervous System:Alert and oriented. No focal neurological deficits/moving extremities, sensation intact. Extremities: Bilateral lower extremity pitting edema, no clubbing, distal peripheral pulses palpable. Prior lt TMA. Skin: No rashes, lesions, no icterus MSK: Normal muscle bulk,tone ,power LUE avf patent w bruise  Medications:  Scheduled Meds:  amiodarone  200 mg Oral Daily   aspirin  81 mg Oral Daily   atorvastatin  80 mg Oral q1800   Chlorhexidine Gluconate Cloth  6 each Topical Q0600   Chlorhexidine Gluconate Cloth  6 each Topical Q0600   gabapentin  300 mg Oral QHS   isosorbide mononitrate  30 mg Oral Daily   metoprolol succinate  50 mg Oral Daily   multivitamin  1 tablet Oral Daily   pantoprazole  40 mg Oral QAC breakfast   predniSONE  30 mg Oral Q breakfast   sodium chloride flush  3 mL Intravenous Q12H   sodium chloride flush  3 mL Intravenous Q12H   umeclidinium-vilanterol  1 puff Inhalation Daily   Continuous Infusions:  sodium chloride     sodium chloride     sodium chloride      Data Reviewed: I have personally reviewed following labs and  imaging studies  CBC: Recent Labs  Lab 01/06/19 0415 01/07/19 0315 01/07/19 0808 01/08/19 0351 01/09/19 0735 01/11/19 1517 01/12/19 0330  WBC 12.2* 14.4*  --  13.8* 16.1*  --  16.2*  NEUTROABS  --   --   --   --  13.5*  --   --   HGB 11.8* 12.2* 13.6   13.6 11.6* 12.1* 12.6* 11.3*  HCT 37.4* 39.0 40.0   40.0 37.7* 40.1 37.0* 36.3*  MCV 90.8 91.8  --  92.4 93.0  --  90.1  PLT 136* 127*  --  107* 130*  --  342*   Basic Metabolic Panel: Recent Labs  Lab 01/06/19 1239 01/07/19 0315 01/07/19 0808 01/08/19 0351 01/09/19 0735 01/11/19 1517 01/12/19 0330  NA 137  140 138   138 138 137 134* 135  K 3.7 3.9 4.0   4.0 4.1 4.2 5.3* 5.6*  CL 98 102  --  100 98  --  101  CO2 23 24  --  22 23  --  18*  GLUCOSE 149* 130*  --  115* 134* 109* 130*  BUN 74* 42*  --  68* 89*  --  91*  CREATININE 6.74* 5.35*  --  6.85* 8.02*  --  8.67*  CALCIUM 8.4* 8.3*  --  8.2* 8.4*  --  8.3*  PHOS 2.5  --   --   --  4.8*  --   --    GFR: Estimated Creatinine Clearance: 10.2 mL/min (A) (by C-G formula based on SCr of 8.67 mg/dL (H)). Liver Function Tests: Recent Labs  Lab 01/06/19 1239 01/07/19 0315 01/09/19 0735  AST  --  32  --   ALT  --  64*  --   ALKPHOS  --  117  --   BILITOT  --  0.8  --   PROT  --  5.7*  --   ALBUMIN 3.3* 3.3* 3.3*   No results for input(s): LIPASE, AMYLASE in the last 168 hours. No results for input(s): AMMONIA in the last 168 hours. Coagulation Profile: No results for input(s): INR, PROTIME in the last 168 hours. Cardiac Enzymes: No results for input(s): CKTOTAL, CKMB, CKMBINDEX, TROPONINI in the last 168 hours. BNP (last 3 results) No results for input(s): PROBNP in the last 8760 hours. HbA1C: No results for input(s): HGBA1C in the last 72 hours. CBG: Recent Labs  Lab 01/06/19 2015  GLUCAP 189*   Lipid Profile: No results for input(s): CHOL, HDL, LDLCALC, TRIG, CHOLHDL, LDLDIRECT in the last 72 hours. Thyroid Function Tests: No results for input(s): TSH, T4TOTAL, FREET4, T3FREE, THYROIDAB in the last 72 hours. Anemia Panel: No results for input(s): VITAMINB12, FOLATE, FERRITIN, TIBC, IRON, RETICCTPCT in the last 72 hours. Sepsis Labs: No results for input(s): PROCALCITON, LATICACIDVEN in the last 168 hours.  Recent Results (from the past 240 hour(s))  Surgical pcr screen     Status: None   Collection Time: 01/10/19  2:15 PM  Result Value Ref Range Status   MRSA, PCR NEGATIVE NEGATIVE Final   Staphylococcus aureus NEGATIVE NEGATIVE Final    Comment: (NOTE) The Xpert SA Assay (FDA approved for NASAL specimens in patients  21 years of age and older), is one component of a comprehensive surveillance program. It is not intended to diagnose infection nor to guide or monitor treatment. Performed at [facility], [place] [address] [place], [place] [address]       Radiology Studies: Vas [place] Upper Ext Vein Mapping (  pre-op Avf)  Result Date: 01/10/2019 UPPER EXTREMITY VEIN MAPPING  Indications: Pre-op dialysis access.  Examination Guidelines: A complete evaluation includes B-mode imaging, spectral Doppler, color Doppler, and power Doppler as needed of all accessible portions of each vessel. Bilateral testing is considered an integral part of a complete examination. Limited examinations for reoccurring indications may be performed as noted. +-----------------+-------------+----------+------------+  Right Cephalic    Diameter (cm) Depth (cm)   Findings    +-----------------+-------------+----------+------------+  Prox upper arm        0.36         0.30                  +-----------------+-------------+----------+------------+  Mid upper arm         0.34         0.31                  +-----------------+-------------+----------+------------+  Dist upper arm        0.35         0.25                  +-----------------+-------------+----------+------------+  Antecubital fossa     0.38         0.27                  +-----------------+-------------+----------+------------+  Prox forearm                               IV, bandages  +-----------------+-------------+----------+------------+  Mid forearm           0.27         0.20                  +-----------------+-------------+----------+------------+  Wrist                 0.25         0.22                  +-----------------+-------------+----------+------------+ +-----------------+-------------+----------+--------+  Left Cephalic     Diameter (cm) Depth (cm) Findings  +-----------------+-------------+----------+--------+  Prox upper arm        0.40         0.23               +-----------------+-------------+----------+--------+  Mid upper arm         0.37         0.24              +-----------------+-------------+----------+--------+  Dist upper arm        0.50         0.27              +-----------------+-------------+----------+--------+  Antecubital fossa     0.52         0.18              +-----------------+-------------+----------+--------+  Prox forearm          0.50         0.41              +-----------------+-------------+----------+--------+  Mid forearm           0.46         0.31              +-----------------+-------------+----------+--------+  Wrist  0.42         0.24              +-----------------+-------------+----------+--------+ *See table(s) above for measurements and observations.  Diagnosing physician: Monica Martinez MD Electronically signed by Monica Martinez MD on 01/10/2019 at 11:56:32 AM.    Final       LOS: 19 days   Time spent: More than 50% of that time was spent in counseling and/or coordination of care.  Antonieta Pert, MD [facility]  01/12/2019, 9:17 AM

## 2019-01-12 NOTE — Plan of Care (Signed)
°  Problem: Coping: °Goal: Level of anxiety will decrease °Outcome: Progressing °  °

## 2019-01-12 NOTE — Progress Notes (Addendum)
IP PROGRESS NOTE  Subjective:  [name] continues to have dyspnea. Has developed nausea and vomiting this morning. No bleeding. Had left arm AVF yesterday. No other complaints.  Objective: Vital signs in last 24 hours: Blood pressure 101/72, pulse 83, temperature 98 F (36.7 C), temperature source Oral, resp. rate 18, height 5\' 11"  (1.803 m), weight 211 lb 13.8 oz (96.1 kg), SpO2 98 %.  Intake/Output from previous day: 04/27 0701 - 04/28 0700 In: 33 [P.O.:480; I.V.:300] Out: 15 [Blood:15]  Physical Exam:  HEENT: no thrush Lungs: Diminished breath sounds bilaterally. Extremities: There is masslike fullness in the left lower leg, erythema at the right and left foot. Trace pitting edema at the low leg bilaterally  Lab Results: Recent Labs    01/11/19 1517 01/12/19 0330  WBC  --  16.2*  HGB 12.6* 11.3*  HCT 37.0* 36.3*  PLT  --  101*    BMET Recent Labs    01/11/19 1517 01/12/19 0330  NA 134* 135  K 5.3* 5.6*  CL  --  101  CO2  --  18*  GLUCOSE 109* 130*  BUN  --  91*  CREATININE  --  8.67*  CALCIUM  --  8.3*     Medications: I have reviewed the patient's current medications.  Assessment/Plan:  1.  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  Renal failure-on hemodialysis, dialysis fistula 01/11/2019 3.  Coronary artery disease-cardiac catheterization 01/07/2019- severe ostial left circumflex lesion, medical therapy recommended, aspirin restarted 01/07/2019 4.  COPD 5.  Peripheral vascular disease-lower extremity arteriogram 01/08/2019, distal bilateral arterial vascular disease, medical therapy recommended 6.  Anemia 7.  CHF 8.  Left calf mass on CTA 12/28/2018, ultrasound 12/29/2018- solid mass without vascularity 9.  Elevated liver enzymes  Larry Klein appears unchanged.  He continues hemodialysis. He has persistent mild thrombocytopenia, improved on prednisone.  I recommend beginning a slow prednisone  taper at discharge.  Recommendations: 1.  Reduce prednisone to 20 mg daily for now, will taper as an outpatient 2.  Refer to Dr. Magda Bernheim at [facility] to evaluate the left calf mass 3.  Outpatient follow-up will be scheduled at the [facility] cancer center for within the next few weeks   LOS: 19 days   [name], NP   01/12/2019, 9:09 AM   Larry Klein appears stable.  The platelet count has remained at greater than 100,000 while on prednisone.  We will taper the prednisone to 20 mg daily.  I will contact my colleagues at the [place] center to arrange outpatient follow-up for next week.  We will make a referral to Dr. Redmond Pulling at [facility] for evaluation of the left calf mass.

## 2019-01-12 NOTE — Progress Notes (Addendum)
Pt. Found in hallway outside of his room in floor. Nurse noted pt. Holding on to rail picking himself up. No injuries noted. Pt. Denies any pain or discomfort. On call for [facility] paged to make aware. Pt. Stated he was just walking out into the hallway to say hello to the staff. States he guided himself to the floor. No fall risk socks on pt. At the time of the fall. Pt. Stated he took them off. Fall risk precautions updated. Pt. Continues to refuse bed and chair alarm. Pt. Stated that he would just sit in the chair if RN put bed alarm on.  "I haven't had a bed alarm since Ive been here and I'm not going to have one now."  VSS. RN will continue to monitor pt.

## 2019-01-12 NOTE — Progress Notes (Signed)
Nutrition Follow-up  RD working remotely.  DOCUMENTATION CODES:   Not applicable  INTERVENTION:   -Continue renal MVI daily  NUTRITION DIAGNOSIS:   Increased nutrient needs related to chronic illness(ESRD on HD) as evidenced by estimated needs.  Ongoing  GOAL:   Patient will meet greater than or equal to 90% of their needs  Progressing  MONITOR:   PO intake, Supplement acceptance, Labs, Weight trends, Skin, I & O's  REASON FOR ASSESSMENT:   LOS    ASSESSMENT:   Larry Klein is a 63 y.o. male with medical history significant of CAD, COPD, hypertension, ESRD on dialysis.  He reports chest pressure and tightness across his entire chest as well as dyspnea with exertion over the past few days.  This is constant in nature.  He denies any fevers or cough.  Had some nausea without vomiting, no diarrhea.  No worsening peripheral edema from his baseline.  He presented to [facility], underwent CTA chest which was negative for PE or any acute findings.  ED physician discussed with [facility] cardiology, was recommended to transfer to [facility] and start on IV heparin.  Patient was apparently also tested for low risk COVID-19 rule out.  4/12- COVID-19 negative 4/13- CTA revealed lt calf mass 4/14- oncology consult; prednisone started due to thrombocytopenia likely related to ITP; ultrasound revealed solid lt calf mass without vascularity 4/24- s/p rt and lt heart and coronary graft/angiography- revealed severe ostial LCx lesions, segment of SVG between diagonal and OM is patent but occluded proximally 4/24- s/p Aortogram, bilateral lower extremity arteriogram 4/27- s/p Procedure: Left Brachial Cephalic AV fistula  Reviewed I/O's: +765 ml x 24 hours and +494 ml since 12/29/18  Per RN notes, pt s/p fall this AM but refuses bed alarm.   Pt anxious today due to nausea and vomiting. He is deferring HD treatment today. His appetite is good; noted meal completion  continues 100%.   Per MD notes, pt approaching d/c soon. Oncology has made arrangements for evaluation of lt calf mass at [facility] as well as follow-up at [facility].   Medications reviewed and include prednisone.   Labs reviewed: K: 5.6, CBGS: 129.   Diet Order:   Diet Order            Diet Heart Room service appropriate? Yes; Fluid consistency: Thin  Diet effective now              EDUCATION NEEDS:   Education needs have been addressed  Skin:  Skin Assessment: Skin Integrity Issues: Skin Integrity Issues:: Incisions Incisions: closed lt arm  Last BM:  01/11/19  Height:   Ht Readings from Last 1 Encounters:  01/11/19 5\' 11"  (1.803 m)    Weight:   Wt Readings from Last 1 Encounters:  01/12/19 96.1 kg    Ideal Body Weight:  78.2 kg  BMI:  Body mass index is 29.55 kg/m.  Estimated Nutritional Needs:   Kcal:  1950-2150  Protein:  100-115 grams  Fluid:  1000 ml + UOP    Emari Demmer A. Jimmye Norman, RD, LDN, [place] Registered Dietitian II Certified Diabetes Care and Education Specialist Pager: [phone] After hours Pager: [phone]

## 2019-01-12 NOTE — Progress Notes (Signed)
Patient ID: Karlis Cregg, male   DOB: 08/06/1956, 63 y.o.   MRN: [id number] S: No new complaints  O:BP 101/72 (BP Location: Right Arm)   Pulse 83   Temp 98 F (36.7 C) (Oral)   Resp 18   Ht 5\' 11"  (1.803 m)   Wt 96.1 kg Comment: scale a  SpO2 98%   BMI 29.55 kg/m   Intake/Output Summary (Last 24 hours) at 01/12/2019 1253 Last data filed at 01/11/2019 2300 Gross per 24 hour  Intake 780 ml  Output 15 ml  Net 765 ml   Intake/Output: I/O last 3 completed shifts: In: 63 [P.O.:580; I.V.:300] Out: 74 [Stool:2; Blood:15]  Intake/Output this shift:  No intake/output data recorded. Weight change: 0 kg Gen: NAD CVS: no rub Resp: cta Abd: benign Ext: 1+ edema of lower extremities, mass on calf of left leg, +erythema of legs bilaterally   Recent Labs  Lab 01/06/19 1239 01/07/19 0315 01/07/19 0808 01/08/19 0351 01/09/19 0735 01/11/19 1517 01/12/19 0330  NA 137 140 138  138 138 137 134* 135  K 3.7 3.9 4.0  4.0 4.1 4.2 5.3* 5.6*  CL 98 102  --  100 98  --  101  CO2 23 24  --  22 23  --  18*  GLUCOSE 149* 130*  --  115* 134* 109* 130*  BUN 74* 42*  --  68* 89*  --  91*  CREATININE 6.74* 5.35*  --  6.85* 8.02*  --  8.67*  ALBUMIN 3.3* 3.3*  --   --  3.3*  --   --   CALCIUM 8.4* 8.3*  --  8.2* 8.4*  --  8.3*  PHOS 2.5  --   --   --  4.8*  --   --   AST  --  32  --   --   --   --   --   ALT  --  64*  --   --   --   --   --    Liver Function Tests: Recent Labs  Lab 01/06/19 1239 01/07/19 0315 01/09/19 0735  AST  --  32  --   ALT  --  64*  --   ALKPHOS  --  117  --   BILITOT  --  0.8  --   PROT  --  5.7*  --   ALBUMIN 3.3* 3.3* 3.3*   No results for input(s): LIPASE, AMYLASE in the last 168 hours. No results for input(s): AMMONIA in the last 168 hours. CBC: Recent Labs  Lab 01/06/19 0415 01/07/19 0315  01/08/19 0351 01/09/19 0735 01/11/19 1517 01/12/19 0330  WBC 12.2* 14.4*  --  13.8* 16.1*  --  16.2*  NEUTROABS  --   --   --   --  13.5*  --   --   HGB  11.8* 12.2*   < > 11.6* 12.1* 12.6* 11.3*  HCT 37.4* 39.0   < > 37.7* 40.1 37.0* 36.3*  MCV 90.8 91.8  --  92.4 93.0  --  90.1  PLT 136* 127*  --  107* 130*  --  101*   < > = values in this interval not displayed.   Cardiac Enzymes: No results for input(s): CKTOTAL, CKMB, CKMBINDEX, TROPONINI in the last 168 hours. CBG: Recent Labs  Lab 01/06/19 2015 01/12/19 1122  GLUCAP 189* 129*    Iron Studies: No results for input(s): IRON, TIBC, TRANSFERRIN, FERRITIN in the last 72 hours. Studies/Results: No results  found. . amiodarone  200 mg Oral Daily  . aspirin  81 mg Oral Daily  . atorvastatin  80 mg Oral q1800  . Chlorhexidine Gluconate Cloth  6 each Topical Q0600  . Chlorhexidine Gluconate Cloth  6 each Topical Q0600  . gabapentin  300 mg Oral QHS  . isosorbide mononitrate  30 mg Oral Daily  . metoprolol succinate  50 mg Oral Daily  . multivitamin  1 tablet Oral Daily  . pantoprazole  40 mg Oral QAC breakfast  . [START ON 01/13/2019] predniSONE  20 mg Oral Q breakfast  . sodium chloride flush  3 mL Intravenous Q12H  . sodium chloride flush  3 mL Intravenous Q12H  . umeclidinium-vilanterol  1 puff Inhalation Daily    BMET    Component Value Date/Time   NA 135 01/12/2019 0330   K 5.6 (H) 01/12/2019 0330   CL 101 01/12/2019 0330   CO2 18 (L) 01/12/2019 0330   GLUCOSE 130 (H) 01/12/2019 0330   BUN 91 (H) 01/12/2019 0330   CREATININE 8.67 (H) 01/12/2019 0330   CALCIUM 8.3 (L) 01/12/2019 0330   GFRNONAA 6 (L) 01/12/2019 0330   GFRAA 7 (L) 01/12/2019 0330   CBC    Component Value Date/Time   WBC 16.2 (H) 01/12/2019 0330   RBC 4.03 (L) 01/12/2019 0330   HGB 11.3 (L) 01/12/2019 0330   HCT 36.3 (L) 01/12/2019 0330   PLT 101 (L) 01/12/2019 0330   MCV 90.1 01/12/2019 0330   MCH 28.0 01/12/2019 0330   MCHC 31.1 01/12/2019 0330   RDW 19.3 (H) 01/12/2019 0330   LYMPHSABS 0.8 01/09/2019 0735   MONOABS 1.2 (H) 01/09/2019 0735   EOSABS 0.0 01/09/2019 0735   BASOSABS 0.0  01/09/2019 0735   Dialysis Orders: TTS at [place] 4h400/800 92.5kg 3K/2.25bath P2 LIJTDC Heparin none -Calcitriol 0.5 mcg PO TIW  Assessment and Plan: 1. ESRD- cont with HD q TTS. HD today  2. Edema- continue to challenge edw as tolerated. Pt wants to UF to 91.5kg today 3. PAD- s/p angiogram, no role for intervention per [facility] 4. Thrombocytopenia- due to ITP responding to steroids.  Per [name] will decrease to 30 mg and taper as an outpatient. 5. Left calf mass- worrisome for sarcoma and pending biopsy with Dr. Magda Bernheim at [facility] 6. Vascular access- sp AVG by [facility] yest 4/27, appreciate [facility] assistance 7. Dispo - stable for d/c from renal standpoint  [facility] 01/12/2019, 12:53 PM

## 2019-01-12 NOTE — Op Note (Addendum)
Procedure: Left Brachial Cephalic AV fistula  Preop: ESRD  Postop: ESRD  Anesthesia: General  Assistant: Leontine Locket PA-c  Findings: 3.5 mm cephalic vein  Procedure: After obtaining informed consent, the patient was taken to the operating room.  After adequate sedation, the left upper extremity was prepped and draped in usual sterile fashion.  Ultrasound was used to identify the course of the left cephalic vein.  Local anesthetic was placed in the left antecubital space A transverse incision was then made near the antecubital crease the left arm. The incision was carried into the subcutaneous tissues down to level of the cephalic vein. The cephalic vein was approximately 3.5 mm in diameter. It was of good quality. This was dissected free circumferentially and small side branches ligated and divided between silk ties or clips. Next the brachial artery was dissected free in the medial portion of the incision. The artery was  3-4 mm in diameter. The vessel loops were placed proximal and distal to the planned site of arteriotomy. The patient was given 5000 units of intravenous heparin. After appropriate circulation time, the vessel loops were used to control the artery. A longitudinal opening was made in the brachial artery.  The vein was ligated distally with a 2-0 silk tie. The vein was controlled proximally with a fine bulldog clamp. The vein was then swung over to the artery and sewn end of vein to side of artery using a running 7-0 Prolene suture. Just prior to completion of the anastomosis, everything was forebled back bled and thoroughly flushed. The anastomosis was secured, vessel loops released, and there was a palpable thrill in the fistula immediately. After hemostasis was obtained, the subcutaneous tissues were reapproximated using a running 3-0 Vicryl suture. The skin was then closed with a 4 0 Vicryl subcuticular stitch. Dermabond was applied to the skin incision.  The patient had a  palpable radial pulse at the end of the case.  Larry Hinds, MD [facility] Office: [phone] Pager: [phone]

## 2019-01-12 NOTE — Progress Notes (Signed)
[facility]  Subjective  - no numbness in hand, + nausea   Objective 101/72 83 98 F (36.7 C) (Oral) 18 98%  Intake/Output Summary (Last 24 hours) at 01/12/2019 0813 Last data filed at 01/11/2019 2300 Gross per 24 hour  Intake 780 ml  Output 15 ml  Net 765 ml   Left upper extremity + thrill in fisula Incisions with ecchymosis but no hematoma  Assessment/Planning: Patent AvF Will arrange f/u in one month Will sign off  Ruta Hinds 01/12/2019 8:13 AM --  Laboratory Lab Results: Recent Labs    01/11/19 1517 01/12/19 0330  WBC  --  16.2*  HGB 12.6* 11.3*  HCT 37.0* 36.3*  PLT  --  101*   BMET Recent Labs    01/11/19 1517 01/12/19 0330  NA 134* 135  K 5.3* 5.6*  CL  --  101  CO2  --  18*  GLUCOSE 109* 130*  BUN  --  91*  CREATININE  --  8.67*  CALCIUM  --  8.3*    COAG Lab Results  Component Value Date   INR 1.2 12/10/2018   INR 1.18 09/18/2018   No results found for: PTT

## 2019-01-13 ENCOUNTER — Encounter (HOSPITAL_COMMUNITY)
Admission: EM | Disposition: A | Discharge status: Home / Self Care | Payer: Self-pay | Source: Other Acute Inpatient Hospital | Attending: Internal Medicine

## 2019-01-13 ENCOUNTER — Encounter: Payer: Self-pay | Admitting: *Deleted

## 2019-01-13 ENCOUNTER — Inpatient Hospital Stay (HOSPITAL_COMMUNITY): Payer: Medicare Other | Admitting: Anesthesiology

## 2019-01-13 ENCOUNTER — Encounter (HOSPITAL_COMMUNITY): Payer: Self-pay | Admitting: *Deleted

## 2019-01-13 ENCOUNTER — Telehealth: Payer: Self-pay | Admitting: Oncology

## 2019-01-13 ENCOUNTER — Other Ambulatory Visit: Payer: Self-pay | Admitting: Nurse Practitioner

## 2019-01-13 ENCOUNTER — Inpatient Hospital Stay (HOSPITAL_COMMUNITY): Payer: Medicare Other

## 2019-01-13 DIAGNOSIS — T18128A Food in esophagus causing other injury, initial encounter: Secondary | ICD-10-CM

## 2019-01-13 DIAGNOSIS — K222 Esophageal obstruction: Secondary | ICD-10-CM

## 2019-01-13 DIAGNOSIS — D696 Thrombocytopenia, unspecified: Secondary | ICD-10-CM

## 2019-01-13 DIAGNOSIS — R1310 Dysphagia, unspecified: Secondary | ICD-10-CM

## 2019-01-13 DIAGNOSIS — R2242 Localized swelling, mass and lump, left lower limb: Secondary | ICD-10-CM

## 2019-01-13 HISTORY — PX: FOREIGN BODY REMOVAL: SHX962

## 2019-01-13 HISTORY — PX: ESOPHAGOGASTRODUODENOSCOPY (EGD) WITH PROPOFOL: SHX5813

## 2019-01-13 LAB — BASIC METABOLIC PANEL
Anion gap: 17 — ABNORMAL HIGH (ref 5–15)
BUN: 48 mg/dL — ABNORMAL HIGH (ref 8–23)
CO2: 22 mmol/L (ref 22–32)
Calcium: 8.7 mg/dL — ABNORMAL LOW (ref 8.9–10.3)
Chloride: 98 mmol/L (ref 98–111)
Creatinine, Ser: 5.94 mg/dL — ABNORMAL HIGH (ref 0.61–1.24)
GFR calc Af Amer: 11 mL/min — ABNORMAL LOW (ref >60)
GFR calc non Af Amer: 9 mL/min — ABNORMAL LOW (ref >60)
Glucose, Bld: 99 mg/dL (ref 70–99)
Potassium: 4.6 mmol/L (ref 3.5–5.1)
Sodium: 137 mmol/L (ref 135–145)

## 2019-01-13 LAB — CBC
HCT: 39.8 % (ref 39.0–52.0)
Hemoglobin: 12.8 g/dL — ABNORMAL LOW (ref 13.0–17.0)
MCH: 28.4 pg (ref 26.0–34.0)
MCHC: 32.2 g/dL (ref 30.0–36.0)
MCV: 88.4 fL (ref 80.0–100.0)
Platelets: 103 10*3/uL — ABNORMAL LOW (ref 150–400)
RBC: 4.5 MIL/uL (ref 4.22–5.81)
RDW: 19.5 % — ABNORMAL HIGH (ref 11.5–15.5)
WBC: 22.7 10*3/uL — ABNORMAL HIGH (ref 4.0–10.5)
nRBC: 0 % (ref 0.0–0.2)

## 2019-01-13 SURGERY — ESOPHAGOGASTRODUODENOSCOPY (EGD) WITH PROPOFOL
Anesthesia: General

## 2019-01-13 MED ORDER — PANTOPRAZOLE SODIUM 40 MG PO TBEC
40.0000 mg | DELAYED_RELEASE_TABLET | Freq: Two times a day (BID) | ORAL | Status: DC
  Administered 2019-01-14 – 2019-01-21 (×14): 40 mg via ORAL
  Filled 2019-01-13 (×15): qty 1

## 2019-01-13 MED ORDER — SODIUM CHLORIDE 0.9 % IV SOLN: INTRAVENOUS | Status: DC

## 2019-01-13 MED ORDER — CALCITRIOL 0.5 MCG PO CAPS
0.5000 ug | ORAL_CAPSULE | ORAL | Status: DC
  Administered 2019-01-14 – 2019-01-21 (×4): 0.5 ug via ORAL
  Filled 2019-01-13 (×7): qty 1

## 2019-01-13 MED ORDER — MORPHINE SULFATE (PF) 2 MG/ML IV SOLN
1.0000 mg | INTRAVENOUS | Status: DC | PRN
  Administered 2019-01-13: 19:00:00 1 mg via INTRAVENOUS

## 2019-01-13 MED ORDER — LEVALBUTEROL HCL 1.25 MG/0.5ML IN NEBU
1.2500 mg | INHALATION_SOLUTION | Freq: Three times a day (TID) | RESPIRATORY_TRACT | Status: DC
  Administered 2019-01-13: 1.25 mg via RESPIRATORY_TRACT
  Filled 2019-01-13 (×3): qty 0.5

## 2019-01-13 MED ORDER — PHENYLEPHRINE HCL (PRESSORS) 10 MG/ML IV SOLN
INTRAVENOUS | Status: DC | PRN
  Administered 2019-01-13: 80 ug via INTRAVENOUS
  Administered 2019-01-13 (×2): 40 ug via INTRAVENOUS

## 2019-01-13 MED ORDER — SODIUM CHLORIDE 0.9 % IV SOLN
INTRAVENOUS | Status: AC | PRN
  Administered 2019-01-13: 500 mL via INTRAVENOUS

## 2019-01-13 MED ORDER — SUCCINYLCHOLINE CHLORIDE 20 MG/ML IJ SOLN
INTRAMUSCULAR | Status: DC | PRN
  Administered 2019-01-13: 100 mg via INTRAVENOUS

## 2019-01-13 MED ORDER — SODIUM CHLORIDE 0.9 % IV SOLN
INTRAVENOUS | Status: DC | PRN
  Administered 2019-01-13: 20:00:00 via INTRAVENOUS

## 2019-01-13 MED ORDER — PROPOFOL 10 MG/ML IV BOLUS
INTRAVENOUS | Status: DC | PRN
  Administered 2019-01-13: 70 mg via INTRAVENOUS

## 2019-01-13 MED ORDER — LIDOCAINE HCL (CARDIAC) PF 100 MG/5ML IV SOSY
PREFILLED_SYRINGE | INTRAVENOUS | Status: DC | PRN
  Administered 2019-01-13: 60 mg via INTRATRACHEAL

## 2019-01-13 MED ORDER — HEPARIN SODIUM (PORCINE) 1000 UNIT/ML IJ SOLN
INTRAMUSCULAR | Status: AC
  Administered 2019-01-13: 01:00:00 3900 [IU]
  Filled 2019-01-13: qty 4

## 2019-01-13 SURGICAL SUPPLY — 15 items

## 2019-01-13 NOTE — Progress Notes (Signed)
Faxed Dr. Gearldine Shown hospital notes, insurance cards and demographics to Dr. Remi Deter office in [place] for referral per [name] request.

## 2019-01-13 NOTE — Op Note (Addendum)
[facility] Patient Name: Larry Klein Procedure Date : 01/13/2019 MRN: [id number] Attending MD: Milus Banister , MD Date of Birth: [birth year] CSN: [id number] Age: 63 Admit Type: Inpatient Procedure:                Upper GI endoscopy Indications:              esophageal food impaction Providers:                Milus Banister, MD, Carlyn Reichert, RN, Laverda Sorenson, Technician, Maia Plan, CRNA Referring MD:              Medicines:                General Anesthesia Complications:            No immediate complications. Estimated blood loss:                            None. Estimated Blood Loss:     Estimated blood loss: none. Procedure:                Pre-Anesthesia Assessment:                           - Prior to the procedure, a History and Physical                            was performed, and patient medications and                            allergies were reviewed. The patient's tolerance of                            previous anesthesia was also reviewed. The risks                            and benefits of the procedure and the sedation                            options and risks were discussed with the patient.                            All questions were answered, and informed consent                            was obtained. Prior Anticoagulants: The patient has                            taken no previous anticoagulant or antiplatelet                            agents. ASA Grade Assessment: IV - A patient with  severe systemic disease that is a constant threat                            to life. After reviewing the risks and benefits,                            the patient was deemed in satisfactory condition to                            undergo the procedure.                           After obtaining informed consent, the endoscope was                            passed under direct vision.  Throughout the                            procedure, the patient's blood pressure, pulse, and                            oxygen saturations were monitored continuously. The                            GIF-H190 ([id number]) Olympus gastroscope was                            introduced through the mouth, and advanced to the                            antrum of the stomach. The upper GI endoscopy was                            accomplished without difficulty. The patient                            tolerated the procedure well. Scope In: Scope Out: Findings:      A large amount of barium was coating the esophagus. This was cleared by       flushing and suctioning.      A large, fibrous food bolus was found in the distal esophagus. Using       suction cap, snare, Roth Net and talon grasper I removed many small       pieces of the very fibrous bolus until it was small enough to gently       push into the stomach. This took about 90 minutes.      There was not an obvious underlying esophageal stricture but there was       reflux and stasis related mild inflammation in the distal esophagus      The exam was otherwise without abnormality. Impression:               - Barium and large fibrous food bolus, removed.                           -  There was not an obvious underlying esophageal                            stricture but there was reflux and stasis related                            mild inflammation in the distal esophagus Recommendation:           - Return patient to hospital ward for ongoing care.                           - Clear liquids tonight. OK to advance to regular                            food in the morning.                           - He needs to be on twice daily PPI for one month                            and then OK to decrease to once daily.                           - He will need repeat EGD in 3-4 weeks to assess                            for underlying stricture,  Barrett's, severe                            esophagitis.                           - He needs to chew his food well, eat slowly and                            take small bites, indefinitely. Procedure Code(s):        --- Professional ---                           [phone], 29, Esophagogastroduodenoscopy, flexible,                            transoral; with removal of foreign body(s) Diagnosis Code(s):        --- Professional ---                           I33.825K, Food in esophagus causing other injury,                            initial encounter                           R13.10, Dysphagia, unspecified CPT copyright 2019 American Medical Association. All rights reserved. The codes documented in this report are preliminary and upon coder review may  be revised to meet current compliance requirements. Milus Banister, MD 01/13/2019 9:20:37 PM This report has been signed electronically. Number of Addenda: 0

## 2019-01-13 NOTE — Consult Note (Signed)
[facility] Referring Provider: Dr.Xu Annamaria Boots, [facility] Primary Care Physician:   Primary Gastroenterologist:  Althia Forts  Reason for Consultation: Complete obstruction of the esophagus on barium esophogram today.   HPI:  Larry Klein is a 63 y.o. male transferred [facility] about 3 weeks ago with chest pain, doe.  Has history of ESRF, htn, CAD, CABG in [place] 2017. Cardiology felt he had unstable angina, underwent angiogram and he was not a candidate for intervention and so his CAD is being medically managed. 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 ECho 3/24 shows CHF EF 25%. At some point during his 3 week stay he had acute right leg pain, swelling. EVentual vascular angiogram showed occluded anterior tibial arteries with "no role for revascularization".  He has Afib, not on anticoag due to low platelets.  He has had nearly daily dysphagia for about 3 years.  HE cuts his food very finely and chews very well before swallowing generally.  He has chronic GERD, takes TUMS only at home.  Started having nausea and vomiting yesterday and this afternoon a barium esophagram shows complete obstruction of the distal esophagus. He cannot point to one particular food bolus that was the problem but thinks it may have been chicken from last night.    Past Medical History:  Diagnosis Date  . COPD (chronic obstructive pulmonary disease) ([place])   . Coronary artery disease   . Dyspnea   . High cholesterol   . Hypertension   . Peripheral vascular disease ([place])   . Renal disorder     Past Surgical History:  Procedure Laterality Date  . AV FISTULA PLACEMENT Left 01/11/2019   Procedure: Creation Brachiocephalic Arteriovenous Fistula;  Surgeon: Elam Dutch, MD;  Location: [facility];  Service: Vascular;  Laterality: Left;  . CARDIAC SURGERY    . CHOLECYSTECTOMY    . CORONARY ARTERY BYPASS GRAFT    . LOWER EXTREMITY ANGIOGRAPHY Bilateral 01/08/2019   Procedure: LOWER  EXTREMITY ANGIOGRAPHY;  Surgeon: [name], MD;  Location: [facility];  Service: Cardiovascular;  Laterality: Bilateral;  . RIGHT/LEFT HEART CATH AND CORONARY/GRAFT ANGIOGRAPHY N/A 01/07/2019   Procedure: RIGHT/LEFT HEART CATH AND CORONARY/GRAFT ANGIOGRAPHY;  Surgeon: Lorretta Harp, MD;  Location: [facility];  Service: Cardiovascular;  Laterality: N/A;  . THROMBECTOMY W/ EMBOLECTOMY Left 09/18/2018   Procedure: ARTERIOVENOUS FISTULA LEFT ARM;  Surgeon: Rosetta Posner, MD;  Location: [facility];  Service: Vascular;  Laterality: Left;    Prior to Admission medications   Medication Sig Start Date End Date Taking? Authorizing Provider  acetaminophen (TYLENOL) 500 MG tablet Take 1,000 mg by mouth as needed for mild pain or headache.   Yes [provider]  albuterol (PROVENTIL HFA;VENTOLIN HFA) 108 (90 Base) MCG/ACT inhaler Inhale 2 puffs into the lungs every 6 (six) hours as needed for wheezing.   Yes [provider]  amiodarone (PACERONE) 200 MG tablet Take 400mg  (2 tabs) twice a day for 12 more days, then decrease to 200mg  (1 tab) daily 12/13/18  Yes Rai, Ripudeep K, MD  aspirin EC 81 MG EC tablet Take 1 tablet (81 mg total) by mouth daily. 12/13/18  Yes Rai, Ripudeep K, MD  atorvastatin (LIPITOR) 40 MG tablet Take 1 tablet (40 mg total) by mouth at bedtime. 12/13/18  Yes Rai, Ripudeep K, MD  bismuth subsalicylate (PEPTO BISMOL) 262 MG/15ML suspension Take 30 mLs by mouth every 6 (six) hours as needed for indigestion.   Yes [provider]  calcium carbonate (TUMS - DOSED IN MG ELEMENTAL CALCIUM) 500 MG chewable tablet Chew 2 tablets by mouth 3 (three) times daily before meals.   Yes [provider]  ipratropium-albuterol (DUONEB) 0.5-2.5 (3) MG/3ML SOLN Take 3 mLs by nebulization every 6 (six) hours as needed (wheezing).   Yes [provider]  metoprolol succinate (TOPROL-XL) 50 MG 24 hr tablet Take 1 tablet (50 mg total) by mouth daily.  Take with or immediately following a meal. 12/13/18  Yes Rai, Ripudeep K, MD  multivitamin (RENA-VIT) TABS tablet Take 1 tablet by mouth daily. 12/13/18  Yes Rai, Ripudeep K, MD  pantoprazole (PROTONIX) 40 MG tablet Take 1 tablet (40 mg total) by mouth daily before breakfast. 12/13/18  Yes Rai, Ripudeep K, MD  umeclidinium-vilanterol (ANORO ELLIPTA) 62.5-25 MCG/INH AEPB Inhale 1 puff into the lungs daily. 12/13/18 03/13/19 Yes Rai, Ripudeep Raliegh Ip, MD    Current Facility-Administered Medications  Medication Dose Route Frequency Provider Last Rate Last Dose  . 0.9 %  sodium chloride infusion  250 mL Intravenous PRN Rhyne, Samantha J, PA-C      . 0.9 %  sodium chloride infusion  250 mL Intravenous PRN Rhyne, Samantha J, PA-C      . 0.9 %  sodium chloride infusion  250 mL Intravenous PRN Rhyne, Samantha J, PA-C      . acetaminophen (TYLENOL) tablet 650 mg  650 mg Oral Q4H PRN Gabriel Earing, PA-C   650 mg at 01/10/19 8563  . albuterol (PROVENTIL) (2.5 MG/3ML) 0.083% nebulizer solution 3 mL  3 mL Inhalation Q4H PRN Rhyne, Samantha J, PA-C   3 mL at 01/13/19 0830  . amiodarone (PACERONE) tablet 200 mg  200 mg Oral Daily Rhyne, Samantha J, PA-C   200 mg at 01/13/19 0917  . aspirin chewable tablet 81 mg  81 mg Oral Daily Rhyne, Hulen Shouts, PA-C   81 mg at 01/12/19 0840  . atorvastatin (LIPITOR) tablet 80 mg  80 mg Oral q1800 Rhyne, Samantha J, PA-C   80 mg at 01/12/19 1745  . bismuth subsalicylate (PEPTO BISMOL) 262 MG/15ML suspension 30 mL  30 mL Oral Q6H PRN Rhyne, Samantha J, PA-C   30 mL at 01/12/19 1850  . [START ON 01/14/2019] calcitRIOL (ROCALTROL) capsule 0.5 mcg  0.5 mcg Oral Q T,Th,Sa-HD Collins, Hervey Ard, PA-C      . Chlorhexidine Gluconate Cloth 2 % PADS 6 each  6 each Topical Q0600 Gabriel Earing, PA-C   6 each at 01/12/19 1497  . Chlorhexidine Gluconate Cloth 2 % PADS 6 each  6 each Topical Q0600 Gabriel Earing, PA-C   6 each at 01/12/19 0263  . diazepam (VALIUM) tablet 10 mg  10 mg Oral  QHS PRN Gabriel Earing, PA-C   10 mg at 01/12/19 2141  . gabapentin (NEURONTIN) tablet 300 mg  300 mg Oral QHS Rhyne, Samantha J, PA-C   300 mg at 01/12/19 2141  . hydrALAZINE (APRESOLINE) injection 5 mg  5 mg Intravenous Q20 Min PRN Rhyne, Hulen Shouts, PA-C      . HYDROcodone-acetaminophen (NORCO/VICODIN) 5-325 MG per tablet 1-2 tablet  1-2 tablet Oral Q4H PRN Gabriel Earing, PA-C   2 tablet at 01/12/19 2141  . isosorbide mononitrate (IMDUR) 24 hr tablet 30 mg  30 mg Oral Daily Rhyne, Samantha J, PA-C   30 mg at 01/11/19 0848  . labetalol (NORMODYNE) injection 10 mg  10 mg Intravenous Q10 min PRN Rhyne, Hulen Shouts, PA-C      .  metoprolol succinate (TOPROL-XL) 24 hr tablet 50 mg  50 mg Oral Daily Rhyne, Samantha J, PA-C   50 mg at 01/11/19 0848  . morphine 2 MG/ML injection 2 mg  2 mg Intravenous Q1H PRN Rhyne, Samantha J, PA-C      . multivitamin (RENA-VIT) tablet 1 tablet  1 tablet Oral Daily Rhyne, Hulen Shouts, PA-C   1 tablet at 01/12/19 0841  . ondansetron (ZOFRAN) injection 4 mg  4 mg Intravenous Q6H PRN Rhyne, Samantha J, PA-C   4 mg at 01/13/19 0913  . pantoprazole (PROTONIX) EC tablet 40 mg  40 mg Oral QAC breakfast Gabriel Earing, PA-C   40 mg at 01/13/19 8242  . predniSONE (DELTASONE) tablet 20 mg  20 mg Oral Q breakfast [name] R, NP   20 mg at 01/13/19 0918  . sodium chloride flush (NS) 0.9 % injection 3 mL  3 mL Intravenous PRN Rhyne, Samantha J, PA-C      . sodium chloride flush (NS) 0.9 % injection 3 mL  3 mL Intravenous Q12H Rhyne, Samantha J, PA-C   3 mL at 01/13/19 0919  . sodium chloride flush (NS) 0.9 % injection 3 mL  3 mL Intravenous Q12H Rhyne, Samantha J, PA-C   3 mL at 01/13/19 0919  . sodium chloride flush (NS) 0.9 % injection 3 mL  3 mL Intravenous PRN Rhyne, Samantha J, PA-C      . umeclidinium-vilanterol (ANORO ELLIPTA) 62.5-25 MCG/INH 1 puff  1 puff Inhalation Daily Rhyne, Samantha J, PA-C   1 puff at 01/13/19 0825    Allergies as of 12/22/2018  . (No  Active Allergies)    Family History  Problem Relation Age of Onset  . Heart disease Mother   . Heart disease Father     Social History   Socioeconomic History  . Marital status: Divorced    Spouse name: Not on file  . Number of children: 3  . Years of education: Not on file  . Highest education level: Not on file  Occupational History  . Not on file  Social Needs  . Financial resource strain: Not on file  . Food insecurity:    Worry: Not on file    Inability: Not on file  . Transportation needs:    Medical: Not on file    Non-medical: Not on file  Tobacco Use  . Smoking status: Former Smoker    Packs/day: 1.00    Years: 50.00    Pack years: 50.00    Last attempt to quit: 09/18/2015    Years since quitting: 3.3  . Smokeless tobacco: Never Used  Substance and Sexual Activity  . Alcohol use: Never    Frequency: Never  . Drug use: Never  . Sexual activity: Not Currently  Lifestyle  . Physical activity:    Days per week: Not on file    Minutes per session: Not on file  . Stress: Not on file  Relationships  . Social connections:    Talks on phone: Not on file    Gets together: Not on file    Attends religious service: Not on file    Active member of club or organization: Not on file    Attends meetings of clubs or organizations: Not on file    Relationship status: Not on file  . Intimate partner violence:    Fear of current or ex partner: Not on file    Emotionally abused: Not on file    Physically abused:  Not on file    Forced sexual activity: Not on file  Other Topics Concern  . Not on file  Social History Narrative  . Not on file     Review of Systems: Pertinent positive and negative review of systems were noted in the above HPI section. Complete review of systems was performed and was otherwise normal.   Physical Exam: Vital signs in last 24 hours: Temp:  [97.5 F (36.4 C)-98.1 F (36.7 C)] 98 F (36.7 C) (04/29 1800) Pulse Rate:  [51-112] 86  (04/29 1800) Resp:  [14-20] 16 (04/29 1800) BP: (96-129)/(49-107) 110/80 (04/29 1800) SpO2:  [91 %-100 %] 93 % (04/29 1159) Weight:  [92.1 kg-95.3 kg] 92.1 kg (04/29 0130) Last BM Date: 01/11/19 Constitutional: generally well-appearing Psychiatric: alert and oriented x3 Eyes: extraocular movements intact Mouth: oral pharynx moist, no lesions Neck: supple no lymphadenopathy Cardiovascular: heart regular rate and rhythm Lungs: clear to auscultation bilaterally Abdomen: soft, nontender, nondistended, no obvious ascites, no peritoneal signs, normal bowel sounds Extremities: no lower extremity edema bilaterally Skin: no lesions on visible extremities  Lab Results: Recent Labs    01/11/19 1517 01/12/19 0330 01/13/19 0422  WBC  --  16.2* 22.7*  HGB 12.6* 11.3* 12.8*  HCT 37.0* 36.3* 39.8  PLT  --  101* 103*  MCV  --  90.1 88.4   BMET Recent Labs    01/11/19 1517 01/12/19 0330 01/13/19 0422  NA 134* 135 137  K 5.3* 5.6* 4.6  CL  --  101 98  CO2  --  18* 22  GLUCOSE 109* 130* 99  BUN  --  91* 48*  CREATININE  --  8.67* 5.94*  CALCIUM  --  8.3* 8.7*    Imaging/Other Results: Dg Esophagus W Single Cm (sol Or Thin Ba)  Result Date: 01/13/2019 CLINICAL DATA:  Inability to swallow secretions. EXAM: ESOPHOGRAM/BARIUM SWALLOW TECHNIQUE: Single contrast examination was performed using  thin barium. FLUOROSCOPY TIME:  Fluoroscopy Time:  1 minutes and 0 seconds. Radiation Exposure Index (if provided by the fluoroscopic device): 61.4 mGy Number of Acquired Spot Images: COMPARISON:  None. FINDINGS: Patient was placed LPO relative to the fluoro table at about 30 degrees head up. He took 1 swallow of thin barium which passed to the level of the distal esophagus where there is a complete mechanical obstruction. Despite several minutes of observation, no barium passed beyond the obstruction into the stomach. IMPRESSION: Complete mechanical obstruction in the distal third of the esophagus. Some  images outline a protruding intraluminal defect that could be food lodged in the distal esophagus although soft tissue from obstructing neoplasm is also possible. Electronically Signed   By: Misty Stanley M.D.   On: 01/13/2019 16:15      Impression/Plan: 63 y.o. male with odynophagia, vomiting, complete obstruction on barium esophagram this afternoon.  Very likely a food impaction in the setting of a chronic (possibly GERD related) stricture, probably happened yesterday.  Will proceed with urgent EGD this evening.   Milus Banister, MD  01/13/2019, 6:38 PM [facility] Pager [phone]

## 2019-01-13 NOTE — Progress Notes (Signed)
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 fib with RVR, chronic thrombocytopenia, anemia, COPD, HTN, admitted on 12/23/2018 with complaints of chest pain and dyspnea on exertion, initially presented to [facility] ED where CTA chest was negative for PE, transferred to [facility] for cardiology evaluation. Cardiology evaluated for unstable angina, was on heparin drip, felt not a candidate for surgical or percutaneous revascularization (prior CABG, thrombocytopenia), added Imdur and signed off. COVID testing negative. Nephrology consulting for HD needs.Vascular surgery consulted for right foot pain.Hematology consulted for thrombocytopenia. Patient was placed on a steroid trial with improvement on the platelet count.  Patient underwent cardiac cath and now on medical management, also underwent lower extremity aortogram, and no role of revascularization.    HPI/Recap of past 24 hours:  He is frustrated, reports started to feel pain in his throat and stomach even after eating ice, he vomited this am after trying breakfast, he does not want to eat lunch, he started to feel this way for two days  He reports had this happened a few times a year since 2017 after his heart surgery  Assessment/Plan: Principal Problem:   Chest pain Active Problems:   Dyspnea   ESRD (end stage renal disease) on dialysis [facility])   Atrial fibrillation with RVR ([place])   Acute on chronic systolic heart failure (HCC)   Cardiomyopathy ([place])   PVD (peripheral vascular disease) ([place])    Dysphagia/odynophagia Suspect worsening of acid reflux/esopahgeal spasm in the setting of prednisone Will get dg esophagus  DG esophagus showed esophageal  obstruction, GI consulted, case discussed with GI Dr Ardis Hughs plan to do urgent EGD today  CAD with unstable angina: Status post cardiac cath, plan is to continue medical therapy:asa,imdur,toprol, lipitor. Previously was not a candidate for intervention or antiplatelet agents due to thrombocytopenia.Patient started on aspirin 81 mg. cardiology signed off.  Acute on chronic systolic heart failure, LVEF 20 to 25% ECHO 3/24: Continue volume management with dialysis,, cont toprol. Appreciate cardiology input, cards signed off, needs follow-up echo in 3 months. previous cardiology in [place] discussed getting a device in 2019 not sure about long-term prognosis currently.  Leg muscle is being evaluated.  PAD s/p aortic stent graft, with right leg pain, leg swelling- NO DVT. S/p aortogram and bilateral lower extremity runoff noted to have occluded anterior tibial arteries b/l.S/P right lower extremity arteriogram - in line flow via PT and as per vascular surgery, no role for revascularization. Cont asa, statins,  S/p LUE AVF 4/.27, patent, vas s/o   Permanent atrial fibrillation: Rate controlled chads 2 vas score of 3, continue amiodarone, beta-blocker.  Previously no anticoagulation due to low platelet.  needs to follow-up with outpatient.   ESRD on HD TTS w mild hyperkalemia:HD for today.  Appreciate nephrology input.  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  Appreciate hematology input and currently on steroid trial since 4/14 and platelet improving. Tapering steroid to 20 mg daily, recommendation for slow taper as outpatient, with outpatient follow-up labs at [place] [facility]. [facility]         Recent Labs  Lab 01/06/19 0415 01/07/19 0315 01/08/19 0351 01/09/19 0735 01/12/19 0330  PLT 136* 127* 107* 130* 101*     Mild transaminitis in the setting of  CHF.  Overall lfts stable.acute viral hepatitis panel was negative.  Left leg mass without vascularity MRI suggest  malignant process will need referral to orthopedic oncology at [facility] for further evaluation, oncology on board. Cont pain control.  COPD stable no wheezing.  Nausea  this morning : continue supportive care, diet as tolerated   Leukocytosis WBC went up today.  Status post AVF procedure yesterday BUT was given Ancef.  Repeat CBC in the morning.  Hold off on antibiotics, as he is afebrile could be related to steroid/reactive  DVT prophylaxis: No SCD due to leg pain.  Not on pharmacologic anticoagulation due to thrombocytopenia. Code Status: Full code   Family Communication: patient , he declined my offer to call his son  Disposition Plan: home in 1-2 days   Consultants:  Vascular surgery  Hematology  Nephrology  cardiology  Procedures: dialysis 4/27 LUE AVF  RIGHT/LEFT HEART CATH AND CORONARY/GRAFT ANGIOGRAPH  Prox RCA to Mid RCA lesion is 100% stenosed.  Ost LM to Mid LM lesion is 50% stenosed.  Ost Cx lesion is 95% stenosed.  Prox LAD lesion is 100% stenosed.  Ost 1st Diag to 1st Diag lesion is 70% stenosed.  Origin to Insertion lesion before 1st Diag is 100% stenoseD  Antibiotics:  perioperative   Objective: BP 106/83 (BP Location: Right Arm)   Pulse 88   Temp 97.6 F (36.4 C) (Oral)   Resp 16   Ht 5\' 11"  (1.803 m)   Wt 92.1 kg Comment: A scale  SpO2 93%   BMI 28.31 kg/m   Intake/Output Summary (Last 24 hours) at 01/13/2019 1432 Last data filed at 01/13/2019 0104 Gross per 24 hour  Intake 0 ml  Output 3004 ml  Net -3004 ml   Filed Weights   01/12/19 0625 01/12/19 2058 01/13/19 0130  Weight: 96.1 kg 95.3 kg 92.1 kg    Exam: Patient is examined daily including today on 01/13/2019, exams remain the same as of yesterday except that has changed    General:  Does not look comfortable  Cardiovascular: RRR  Respiratory: CTABL  Abdomen: Soft/ND/NT, positive BS  Musculoskeletal: trace Edema right lower extremity, left leg + soft  tissue mass,  left foot h/o TMA  Many years ago  Neuro: alert, oriented   Data Reviewed: Basic Metabolic Panel: Recent Labs  Lab 01/07/19 0315  01/08/19 0351 01/09/19 0735 01/11/19 1517 01/12/19 0330 01/13/19 0422  NA 140   < > 138 137 134* 135 137  K 3.9   < > 4.1 4.2 5.3* 5.6* 4.6  CL 102  --  100 98  --  101 98  CO2 24  --  22 23  --  18* 22  GLUCOSE 130*  --  115* 134* 109* 130* 99  BUN 42*  --  68* 89*  --  91* 48*  CREATININE 5.35*  --  6.85* 8.02*  --  8.67* 5.94*  CALCIUM 8.3*  --  8.2* 8.4*  --  8.3* 8.7*  PHOS  --   --   --  4.8*  --   --   --    < > = values in this interval not displayed.   Liver Function Tests: Recent Labs  Lab 01/07/19 0315 01/09/19 0735  AST 32  --   ALT 64*  --   ALKPHOS 117  --   BILITOT 0.8  --   PROT 5.7*  --   ALBUMIN 3.3* 3.3*   No results for input(s): LIPASE,  AMYLASE in the last 168 hours. No results for input(s): AMMONIA in the last 168 hours. CBC: Recent Labs  Lab 01/07/19 0315  01/08/19 0351 01/09/19 0735 01/11/19 1517 01/12/19 0330 01/13/19 0422  WBC 14.4*  --  13.8* 16.1*  --  16.2* 22.7*  NEUTROABS  --   --   --  13.5*  --   --   --   HGB 12.2*   < > 11.6* 12.1* 12.6* 11.3* 12.8*  HCT 39.0   < > 37.7* 40.1 37.0* 36.3* 39.8  MCV 91.8  --  92.4 93.0  --  90.1 88.4  PLT 127*  --  107* 130*  --  101* 103*   < > = values in this interval not displayed.   Cardiac Enzymes:   No results for input(s): CKTOTAL, CKMB, CKMBINDEX, TROPONINI in the last 168 hours. BNP (last 3 results) Recent Labs    12/07/18 2237  BNP 1,637.3*    ProBNP (last 3 results) No results for input(s): PROBNP in the last 8760 hours.  CBG: Recent Labs  Lab 01/06/19 2015 01/12/19 1122  GLUCAP 189* 129*    Recent Results (from the past 240 hour(s))  Surgical pcr screen     Status: None   Collection Time: 01/10/19  2:15 PM  Result Value Ref Range Status   MRSA, PCR NEGATIVE NEGATIVE Final   Staphylococcus aureus NEGATIVE NEGATIVE  Final    Comment: (NOTE) The Xpert SA Assay (FDA approved for NASAL specimens in patients 32 years of age and older), is one component of a comprehensive surveillance program. It is not intended to diagnose infection nor to guide or monitor treatment. Performed at [facility], [place] [address], [place], [place] [address]      Studies: No results found.  Scheduled Meds: . amiodarone  200 mg Oral Daily  . aspirin  81 mg Oral Daily  . atorvastatin  80 mg Oral q1800  . [START ON 01/14/2019] calcitRIOL  0.5 mcg Oral Q T,Th,Sa-HD  . Chlorhexidine Gluconate Cloth  6 each Topical Q0600  . Chlorhexidine Gluconate Cloth  6 each Topical Q0600  . gabapentin  300 mg Oral QHS  . isosorbide mononitrate  30 mg Oral Daily  . metoprolol succinate  50 mg Oral Daily  . multivitamin  1 tablet Oral Daily  . pantoprazole  40 mg Oral QAC breakfast  . predniSONE  20 mg Oral Q breakfast  . sodium chloride flush  3 mL Intravenous Q12H  . sodium chloride flush  3 mL Intravenous Q12H  . umeclidinium-vilanterol  1 puff Inhalation Daily    Continuous Infusions: . sodium chloride    . sodium chloride    . sodium chloride       Time spent: 68mins I have personally reviewed and interpreted on  01/13/2019 daily labs, tele strips, imagings as discussed above under date review session and assessment and plans.  I reviewed all nursing notes, pharmacy notes, consultant notes,  vitals, pertinent old records  I have discussed plan of care as described above with RN , patient on 01/13/2019   Florencia Reasons MD, PhD  [facility] Pager [phone]. If 7PM-7AM, please contact night-coverage at [email or web address], password [facility] 01/13/2019, 2:32 PM  LOS: 20 days

## 2019-01-13 NOTE — Anesthesia Preprocedure Evaluation (Addendum)
Anesthesia Evaluation  Patient identified by MRN, date of birth, ID band Patient awake    Reviewed: Allergy & Precautions, NPO status , Patient's Chart, lab work & pertinent test results  Airway Mallampati: II  TM Distance: >3 FB Neck ROM: Full    Dental no notable dental hx.    Pulmonary shortness of breath, COPD, former smoker,    Pulmonary exam normal breath sounds clear to auscultation + decreased breath sounds      Cardiovascular hypertension, + CAD, + CABG and + Peripheral Vascular Disease  Normal cardiovascular exam Rhythm:Regular Rate:Normal  3/20  LVEF 20-25%, moderately dilated LV, severe global hypokinesis, mildly reduced RV systolic function, moderate biatrial enlargment, moderate to severe eccentric MR, mild TR, aortic valve sclerosis, dilated IVC   Neuro/Psych negative neurological ROS  negative psych ROS   GI/Hepatic negative GI ROS, Neg liver ROS,   Endo/Other  negative endocrine ROS  Renal/GU DialysisRenal disease  negative genitourinary   Musculoskeletal negative musculoskeletal ROS (+)   Abdominal   Peds negative pediatric ROS (+)  Hematology negative hematology ROS (+)   Anesthesia Other Findings   Reproductive/Obstetrics negative OB ROS                            Anesthesia Physical Anesthesia Plan  ASA: IV and emergent  Anesthesia Plan: General   Post-op Pain Management:    Induction: Intravenous and Rapid sequence  PONV Risk Score and Plan: 2 and Ondansetron and Treatment may vary due to age or medical condition  Airway Management Planned: Oral ETT  Additional Equipment:   Intra-op Plan:   Post-operative Plan: Extubation in OR  Informed Consent: I have reviewed the patients History and Physical, chart, labs and discussed the procedure including the risks, benefits and alternatives for the proposed anesthesia with the patient or authorized  representative who has indicated his/her understanding and acceptance.     Dental advisory given  Plan Discussed with: CRNA and Surgeon  Anesthesia Plan Comments:         Anesthesia Quick Evaluation

## 2019-01-13 NOTE — Anesthesia Procedure Notes (Signed)
Procedure Name: Intubation Date/Time: 01/13/2019 8:07 PM Performed by: Clovis Cao, CRNA Pre-anesthesia Checklist: Patient identified, Emergency Drugs available, Suction available, Patient being monitored and Timeout performed Patient Re-evaluated:Patient Re-evaluated prior to induction Oxygen Delivery Method: Circle system utilized Preoxygenation: Pre-oxygenation with 100% oxygen Induction Type: IV induction, Rapid sequence and Cricoid Pressure applied Laryngoscope Size: Miller and 2 Grade View: Grade I Tube type: Oral Tube size: 7.5 mm Number of attempts: 1 Airway Equipment and Method: Stylet Placement Confirmation: ETT inserted through vocal cords under direct vision,  positive ETCO2 and breath sounds checked- equal and bilateral Secured at: 22 cm Tube secured with: Tape Dental Injury: Teeth and Oropharynx as per pre-operative assessment

## 2019-01-13 NOTE — Transfer of Care (Signed)
Immediate Anesthesia Transfer of Care Note  Patient: Wilmer Santillo  Procedure(s) Performed: ESOPHAGOGASTRODUODENOSCOPY (EGD) WITH PROPOFOL (N/A ) FOREIGN BODY REMOVAL  Patient Location: PACU  Anesthesia Type:General  Level of Consciousness: drowsy  Airway & Oxygen Therapy: Patient Spontanous Breathing and Patient connected to face mask oxygen  Post-op Assessment: Report given to RN and Post -op Vital signs reviewed and stable  Post vital signs: Reviewed and stable  Last Vitals:  Vitals Value Taken Time  BP 128/76 01/13/2019  9:20 PM  Temp    Pulse 77 01/13/2019  9:25 PM  Resp 15 01/13/2019  9:25 PM  SpO2 100 % 01/13/2019  9:25 PM  Vitals shown include unvalidated device data.  Last Pain:  Vitals:   01/13/19 1923  TempSrc: Oral  PainSc: 0-No pain      Patients Stated Pain Goal: 0 (62/37/62 8315)  Complications: No apparent anesthesia complications

## 2019-01-13 NOTE — Telephone Encounter (Signed)
Referral messages sent to HIM staff re referrals requested per 4/29 schedule message. No other orders.

## 2019-01-13 NOTE — Progress Notes (Addendum)
[facility] Progress Note   Subjective:   Patient seen in room. Expresses frustration with his health this AM. Also expresses frustration that dialysis does not make him feel better but states he knows he would die if he stopped HD and wishes to continue. Reporting nausea/possible dysphagia. Ongoing dyspnea, feels it is stable. No wheezing or cough. Denies CP, palpitations. Reports table peripheral edema.  Objective Vitals:   01/13/19 0233 01/13/19 0429 01/13/19 0911 01/13/19 1159  BP:  99/84 (!) 129/95 106/83  Pulse:  (!) 51  88  Resp:  16  16  Temp:  98.1 F (36.7 C)  97.6 F (36.4 C)  TempSrc:  Oral  Oral  SpO2: 96% 91% 91% 93%  Weight:      Height:       Physical Exam General: Chronically ill appearing male, alert and seated on bedside. In NAD Heart: Rate controlled. No murmurs, rubs or gallops Lungs: CTA bilaterally without wheezing, rhonchi or rales Abdomen: Soft, non-distended, non-tender. +BS Extremities: 1+ pitting edema b/l lower extremities. Prior TMA L leg, erythema of legs bilaterally Dialysis Access: TDC L chest. LUE AVF + bruit with surrounding bruising.   Additional Objective Labs: Basic Metabolic Panel: Recent Labs  Lab 01/09/19 0735 01/11/19 1517 01/12/19 0330 01/13/19 0422  NA 137 134* 135 137  K 4.2 5.3* 5.6* 4.6  CL 98  --  101 98  CO2 23  --  18* 22  GLUCOSE 134* 109* 130* 99  BUN 89*  --  91* 48*  CREATININE 8.02*  --  8.67* 5.94*  CALCIUM 8.4*  --  8.3* 8.7*  PHOS 4.8*  --   --   --    Liver Function Tests: Recent Labs  Lab 01/07/19 0315 01/09/19 0735  AST 32  --   ALT 64*  --   ALKPHOS 117  --   BILITOT 0.8  --   PROT 5.7*  --   ALBUMIN 3.3* 3.3*   CBC: Recent Labs  Lab 01/07/19 0315  01/08/19 0351 01/09/19 0735 01/11/19 1517 01/12/19 0330 01/13/19 0422  WBC 14.4*  --  13.8* 16.1*  --  16.2* 22.7*  NEUTROABS  --   --   --  13.5*  --   --   --   HGB 12.2*   < > 11.6* 12.1* 12.6* 11.3* 12.8*  HCT 39.0   < >  37.7* 40.1 37.0* 36.3* 39.8  MCV 91.8  --  92.4 93.0  --  90.1 88.4  PLT 127*  --  107* 130*  --  101* 103*   < > = values in this interval not displayed.   CBG: Recent Labs  Lab 01/06/19 2015 01/12/19 1122  GLUCAP 189* 129*   Medications: . sodium chloride    . sodium chloride    . sodium chloride     . amiodarone  200 mg Oral Daily  . aspirin  81 mg Oral Daily  . atorvastatin  80 mg Oral q1800  . Chlorhexidine Gluconate Cloth  6 each Topical Q0600  . Chlorhexidine Gluconate Cloth  6 each Topical Q0600  . gabapentin  300 mg Oral QHS  . isosorbide mononitrate  30 mg Oral Daily  . metoprolol succinate  50 mg Oral Daily  . multivitamin  1 tablet Oral Daily  . pantoprazole  40 mg Oral QAC breakfast  . predniSONE  20 mg Oral Q breakfast  . sodium chloride flush  3 mL Intravenous Q12H  . sodium chloride flush  3 mL Intravenous Q12H  . umeclidinium-vilanterol  1 puff Inhalation Daily    Dialysis Orders: TTS at [facility] 4h400/800 92.5kg 3K/2.25bath P2 LIJTDC Heparin none -Calcitriol 0.5 mcg PO TIW  Assessment/Plan: 1. CAD with unstable angina: S/p cardiac cath, on medical therapy. Management per primary. 2. Nausea/dysphagia: Reportedly started yesterday, describing sternal pain with swallowing. Discussed with patient's attending, who reports she has ordered a DG esophagus already.  2. ESRD: TTS schedule. K+ 4.6. Next HD planned for 01/04/2019. S/p LUE AVF placement on 4/27. No symptoms of steal syndrome. To follow up with [facility] in 1 month.  3. HTN/volume:  1+ edema today, 3L net UF with HD yesterday. BP soft but appears stable. Now below EDW but ongoing edema/SOB. Will plan for UFG 3.0L again with HD tomorrow.  4. Anemia: Hgb 12.8. No ESA indicated at this time.  5. Secondary hyperparathyroidism:  Calcium 8.7, corrected 9.1. Will resume calcitriol. Phos 4.8, not currently on a binder.  6. Nutrition:  Albumin 3.3. Poor appetite secondary to nausea/dysphagia at this  time.  7. PAD s/p aortic stent, leg swelling: Reportedly no role for revascularization. Also with lefft leg mass on MRI suggesting malignant process, plans for follow up with orthopedic oncology at [facility] after discharge.  8. A.fib: Not on anticoagulation due to thrombocytopenia. Rate currently controlled. Per primary/cardiology.  9. Thromobcytopenia: ITP, on steroid taper, following up with hematology as outpatient. Will hold heparin with HD.  10. COPD: Stable dyspnea.   Anice Paganini, PA-C 01/13/2019, 1:27 PM  [facility] Pager: [phone]  Pt seen, examined and agree w A/P as above.  [facility] 01/13/2019, 2:09 PM

## 2019-01-14 ENCOUNTER — Telehealth: Payer: Self-pay

## 2019-01-14 ENCOUNTER — Telehealth: Payer: Self-pay | Admitting: *Deleted

## 2019-01-14 ENCOUNTER — Inpatient Hospital Stay (HOSPITAL_COMMUNITY): Payer: Medicare Other

## 2019-01-14 DIAGNOSIS — N189 Chronic kidney disease, unspecified: Secondary | ICD-10-CM

## 2019-01-14 DIAGNOSIS — K219 Gastro-esophageal reflux disease without esophagitis: Secondary | ICD-10-CM

## 2019-01-14 DIAGNOSIS — R2242 Localized swelling, mass and lump, left lower limb: Secondary | ICD-10-CM

## 2019-01-14 DIAGNOSIS — K228 Other specified diseases of esophagus: Secondary | ICD-10-CM

## 2019-01-14 DIAGNOSIS — J449 Chronic obstructive pulmonary disease, unspecified: Secondary | ICD-10-CM

## 2019-01-14 DIAGNOSIS — J690 Pneumonitis due to inhalation of food and vomit: Secondary | ICD-10-CM

## 2019-01-14 DIAGNOSIS — I509 Heart failure, unspecified: Secondary | ICD-10-CM

## 2019-01-14 DIAGNOSIS — R740 Nonspecific elevation of levels of transaminase and lactic acid dehydrogenase [LDH]: Secondary | ICD-10-CM

## 2019-01-14 MED ORDER — HEPARIN SODIUM (PORCINE) 1000 UNIT/ML IJ SOLN
INTRAMUSCULAR | Status: AC
  Filled 2019-01-14: qty 4

## 2019-01-14 MED ORDER — METOPROLOL SUCCINATE ER 25 MG PO TB24
25.0000 mg | ORAL_TABLET | Freq: Every day | ORAL | Status: DC
  Administered 2019-01-15 – 2019-01-21 (×7): 25 mg via ORAL
  Filled 2019-01-14 (×8): qty 1

## 2019-01-14 MED ORDER — GUAIFENESIN ER 600 MG PO TB12
600.0000 mg | ORAL_TABLET | Freq: Two times a day (BID) | ORAL | Status: DC
  Administered 2019-01-14 – 2019-01-21 (×16): 600 mg via ORAL
  Filled 2019-01-14 (×15): qty 1

## 2019-01-14 MED ORDER — LEVALBUTEROL HCL 1.25 MG/0.5ML IN NEBU
1.2500 mg | INHALATION_SOLUTION | Freq: Three times a day (TID) | RESPIRATORY_TRACT | Status: DC
  Administered 2019-01-14 – 2019-01-15 (×5): 1.25 mg via RESPIRATORY_TRACT
  Filled 2019-01-14 (×6): qty 0.5

## 2019-01-14 MED ORDER — SODIUM CHLORIDE 0.9 % IV SOLN
3.0000 g | INTRAVENOUS | Status: DC
  Administered 2019-01-14 – 2019-01-19 (×6): 3 g via INTRAVENOUS
  Filled 2019-01-14 (×7): qty 3

## 2019-01-14 MED ORDER — SODIUM CHLORIDE 0.9 % IV SOLN
3.0000 g | INTRAVENOUS | Status: DC
  Filled 2019-01-14: qty 3

## 2019-01-14 MED ORDER — IPRATROPIUM BROMIDE 0.02 % IN SOLN
0.5000 mg | Freq: Three times a day (TID) | RESPIRATORY_TRACT | Status: DC
  Administered 2019-01-14 – 2019-01-15 (×5): 0.5 mg via RESPIRATORY_TRACT
  Filled 2019-01-14 (×6): qty 2.5

## 2019-01-14 NOTE — Telephone Encounter (Signed)
appt made for 5/18 to speak with Dr Ardis Hughs regarding follow up EGD for food impaction

## 2019-01-14 NOTE — Progress Notes (Addendum)
IP PROGRESS NOTE  Subjective:  Larry Klein continues to have dyspnea. Reports sore throat. Had EGD yesterday by Dr. Ardis Hughs. He had barium and large fibrous food bolus, removed. There was not an obvious underlying esophageal stricture but there was reflux and stasis related mild inflammation in the distal esophagus. Plan is for a repeat EGD in 3-4 weeks. No other complaints this am.   Objective: Vital signs in last 24 hours: Blood pressure (!) 103/52, pulse 84, temperature 98.3 F (36.8 C), temperature source Oral, resp. rate 16, height 5\' 11"  (1.803 m), weight 201 lb 1 oz (91.2 kg), SpO2 98 %.  Intake/Output from previous day: 04/29 0701 - 04/30 0700 In: 520 [P.O.:220; I.V.:300] Out: 250 [Urine:250]  Physical Exam:  HEENT: no thrush Lungs: Diminished breath sounds bilaterally. Extremities: There is masslike fullness in the left lower leg, erythema at the right and left foot. Trace pitting edema at the low leg bilaterally  Lab Results: Recent Labs    01/12/19 0330 01/13/19 0422  WBC 16.2* 22.7*  HGB 11.3* 12.8*  HCT 36.3* 39.8  PLT 101* 103*    BMET Recent Labs    01/12/19 0330 01/13/19 0422  NA 135 137  K 5.6* 4.6  CL 101 98  CO2 18* 22  GLUCOSE 130* 99  BUN 91* 48*  CREATININE 8.67* 5.94*  CALCIUM 8.3* 8.7*     Medications: I have reviewed the patient's current medications.  Assessment/Plan:  1.  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  Renal failure-on hemodialysis, dialysis fistula 01/11/2019 3.  Coronary artery disease-cardiac catheterization 01/07/2019- severe ostial left circumflex lesion, medical therapy recommended, aspirin restarted 01/07/2019 4.  COPD 5.  Peripheral vascular disease-lower extremity arteriogram 01/08/2019, distal bilateral arterial vascular disease, medical therapy recommended 6.  Anemia 7.  CHF 8.  Left calf mass on CTA 12/28/2018, ultrasound 12/29/2018- solid mass without  vascularity 9.  Elevated liver enzymes  Mr. Graziani appears unchanged.  He continues hemodialysis. He has persistent mild thrombocytopenia, improved on prednisone.  I recommend beginning a slow prednisone taper at discharge.  Recommendations: 1.  Continue Prednisone 20 mg daily for now, will taper as an outpatient.  2.  An outpatient referral has been placed to Dr. Magda Bernheim at [facility] to evaluate the left calf mass 3.  Outpatient follow-up will be scheduled at the [facility] with Dr. Hosie Poisson. Please copy Dr. Hinton Rao on the discharge summary.    LOS: 21 days   [name], NP   01/14/2019, 9:51 AM  Mr. Redmon appears unchanged.  The platelet count remained adequate yesterday.  He will continue prednisone at a dose of 20 mg daily.  I discussed the case with Dr. Hinton Rao at the [facility].  She will see him next week.  He has been referred to Dr. Redmond Pulling at [facility] to evaluate the left calf mass.  Julieanne Manson, MD

## 2019-01-14 NOTE — Progress Notes (Signed)
Pharmacy Antibiotic Note  Larry Klein is a 63 y.o. male admitted on 12/23/2018 with pneumonia.  Pharmacy has been consulted for Unasyn dosing.  Plan: Unasyn 3 grams iv Q 24 hours x 7 days Pharmacy to sign off with ESRD  Height: 5\' 11"  (180.3 cm) Weight: 201 lb 1 oz (91.2 kg) IBW/kg (Calculated) : 75.3  Temp (24hrs), Avg:97.9 F (36.6 C), Min:97.5 F (36.4 C), Max:98.3 F (36.8 C)  Recent Labs  Lab 01/08/19 0351 01/09/19 0735 01/12/19 0330 01/13/19 0422  WBC 13.8* 16.1* 16.2* 22.7*  CREATININE 6.85* 8.02* 8.67* 5.94*    Estimated Creatinine Clearance: 14.5 mL/min (A) (by C-G formula based on SCr of 5.94 mg/dL (H)).    No Active Allergies   Thank you Anette Guarneri, PharmD [phone]  01/14/2019 11:53 AM

## 2019-01-14 NOTE — Telephone Encounter (Signed)
-----   Message from Willia Craze, NP sent at 01/14/2019  1:39 PM EDT ----- Verlee Monte, this patient needs EGD in ~ 3 weeks for follow up food disimpaction but may need phone visit prior as he has multiple medical problems. EGD will need to be on M/W/F due to dialysis and I expect Dr. Ardis Hughs will want it done at [facility]. Patient will probably remain hospitalized for next few days for treatment of PNA. Thanks. Hope you are well and staying safe.  PG

## 2019-01-14 NOTE — Progress Notes (Addendum)
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 fib with RVR, chronic thrombocytopenia, anemia, COPD, HTN, admitted on 12/23/2018 with complaints of chest pain and dyspnea on exertion, initially presented to [facility] ED where CTA chest was negative for PE, transferred to [facility] for cardiology evaluation. Cardiology evaluated for unstable angina, was on heparin drip, felt not a candidate for surgical or percutaneous revascularization (prior CABG, thrombocytopenia), added Imdur and signed off. COVID testing negative. Nephrology consulting for HD needs.Vascular surgery consulted for right foot pain.Hematology consulted for thrombocytopenia. Patient was placed on a steroid trial with improvement on the platelet count.  Patient underwent cardiac cath and now on medical management, also underwent lower extremity aortogram, and no role of revascularization.    HPI/Recap of past 24 hours:   he underwent urgent EGD last night with removal of a large fibrous food bolus from the distal esophagus His blood pressure is soft, wbc is elevated, he has congested cough, no fever   Assessment/Plan: Principal Problem:   Chest pain Active Problems:   Dyspnea   ESRD (end stage renal disease) on dialysis [facility])   Atrial fibrillation with RVR (HCC)   Acute on chronic systolic heart failure (HCC)   Cardiomyopathy (HCC)   PVD (peripheral vascular disease) ([place])   Dysphagia   Esophageal obstruction due to food impaction    Dysphagia/odynophagia  urgent EGD on 4/29 evening with removal of a large fibrous food bolus from the distal esophagus GI input appreciated 4/30 his blood pressure is soft, wbc elevated, no fever, stat cxr  ordered, empircally start on unasyn to cover possible aspiration, d/c imdur Add on mucinex and nebs   CAD with unstable angina:  -Status post cardiac cath, plan is to continue medical therapy:asa,lipitor, imdur (hold of 4/30 due to soft blood pressure), decrease toprol with holding parameter, lipitor.  - cardiology signed off.  Acute on chronic systolic heart failure,  -LVEF 20 to 25% ECHO 3/24: Continue volume management with dialysis,, cont toprol. Appreciate cardiology input, cards signed off, needs follow-up echo in 3 months.  previous cardiology in [facility] discussed getting a device in 2019 not sure about long-term prognosis currently.   Permanent atrial fibrillation: Rate controlled chads 2 vas score of 3, continue amiodarone, beta-blocker.  Previously no anticoagulation due to low platelet.  needs to follow-up with outpatient.   Mild transaminitis in the setting of CHF.  Overall lfts stable.acute viral hepatitis panel was negative.  PAD s/p aortic stent graft, with right leg pain, leg swelling- NO DVT. S/p aortogram and bilateral lower extremity runoff noted to have occluded anterior tibial arteries b/l.S/P right lower extremity arteriogram - in line flow via PT and as per vascular surgery, no role for revascularization. Cont asa, statins,  ESRD on HD TTS w mild hyperkalemia: .  Appreciate nephrology input. 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  Appreciate hematology input and currently on steroid trial since 4/14 and platelet improving. Tapering steroid to 20 mg daily, recommendation for slow taper as outpatient, with outpatient follow-up labs at [place] center.   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 Cont pain  control.  COPD stable no wheezing. COVID 19 is negative   DVT prophylaxis: No  SCD due to leg pain.  Not on pharmacologic anticoagulation due to thrombocytopenia. Code Status: Full code   Family Communication: patient , he declined my offer to call his son  Disposition Plan: not ready to discharge   Consultants:  Vascular surgery  Hematology  Nephrology  cardiology  Procedures: dialysis 4/27 LUE AVF  RIGHT/LEFT HEART CATH AND CORONARY/GRAFT ANGIOGRAPH  Prox RCA to Mid RCA lesion is 100% stenosed.  Ost LM to Mid LM lesion is 50% stenosed.  Ost Cx lesion is 95% stenosed.  Prox LAD lesion is 100% stenosed.  Ost 1st Diag to 1st Diag lesion is 70% stenosed.  Origin to Insertion lesion before 1st Diag is 100% stenoseD  Antibiotics:  perioperative   Objective: BP 111/83    Pulse 94    Temp 98.3 F (36.8 C) (Oral)    Resp 16    Ht 5\' 11"  (1.803 m)    Wt 91.2 kg    SpO2 98%    BMI 28.04 kg/m   Intake/Output Summary (Last 24 hours) at 01/14/2019 1144 Last data filed at 01/14/2019 0300 Gross per 24 hour  Intake 640 ml  Output 375 ml  Net 265 ml   Filed Weights   01/13/19 1923 01/14/19 0422 01/14/19 0750  Weight: 92.1 kg 91.5 kg 91.2 kg    Exam: Patient is examined daily including today on 01/14/2019, exams remain the same as of yesterday except that has changed    General:  Does not look comfortable  Cardiovascular: RRR  Respiratory: + wheezing, rhonchi  Abdomen: Soft/ND/NT, positive BS  Musculoskeletal: trace Edema right lower extremity, left leg + soft tissue mass,  left foot h/o TMA  Many years ago  Neuro: alert, oriented   Data Reviewed: Basic Metabolic Panel: Recent Labs  Lab 01/08/19 0351 01/09/19 0735 01/11/19 1517 01/12/19 0330 01/13/19 0422  NA 138 137 134* 135 137  K 4.1 4.2 5.3* 5.6* 4.6  CL 100 98  --  101 98  CO2 22 23  --  18* 22  GLUCOSE 115* 134* 109* 130* 99  BUN 68* 89*  --  91* 48*  CREATININE 6.85* 8.02*  --  8.67* 5.94*  CALCIUM 8.2* 8.4*  --  8.3* 8.7*  PHOS  --  4.8*  --   --   --    Liver  Function Tests: Recent Labs  Lab 01/09/19 0735  ALBUMIN 3.3*   No results for input(s): LIPASE, AMYLASE in the last 168 hours. No results for input(s): AMMONIA in the last 168 hours. CBC: Recent Labs  Lab 01/08/19 0351 01/09/19 0735 01/11/19 1517 01/12/19 0330 01/13/19 0422  WBC 13.8* 16.1*  --  16.2* 22.7*  NEUTROABS  --  13.5*  --   --   --   HGB 11.6* 12.1* 12.6* 11.3* 12.8*  HCT 37.7* 40.1 37.0* 36.3* 39.8  MCV 92.4 93.0  --  90.1 88.4  PLT 107* 130*  --  101* 103*   Cardiac Enzymes:   No results for input(s): CKTOTAL, CKMB, CKMBINDEX, TROPONINI in the last 168 hours. BNP (last 3 results) Recent Labs    12/07/18 2237  BNP 1,637.3*    ProBNP (last 3 results) No results for input(s): PROBNP in the last 8760 hours.  CBG: Recent Labs  Lab 01/12/19 1122  GLUCAP 129*    Recent Results (from the past 240 hour(s))  Surgical pcr screen  Status: None   Collection Time: 01/10/19  2:15 PM  Result Value Ref Range Status   MRSA, PCR NEGATIVE NEGATIVE Final   Staphylococcus aureus NEGATIVE NEGATIVE Final    Comment: (NOTE) The Xpert SA Assay (FDA approved for NASAL specimens in patients 79 years of age and older), is one component of a comprehensive surveillance program. It is not intended to diagnose infection nor to guide or monitor treatment. Performed at [facility], [place] [address] [place], [place] [address]      Studies: Dg Esophagus W Single Cm (sol Or Thin Ba)  Result Date: 01/13/2019 CLINICAL DATA:  Inability to swallow secretions. EXAM: ESOPHOGRAM/BARIUM SWALLOW TECHNIQUE: Single contrast examination was performed using  thin barium. FLUOROSCOPY TIME:  Fluoroscopy Time:  1 minutes and 0 seconds. Radiation Exposure Index (if provided by the fluoroscopic device): 61.4 mGy Number of Acquired Spot Images: COMPARISON:  None. FINDINGS: Patient was placed LPO relative to the fluoro table at about 30 degrees head up. He took 1 swallow of thin barium which  passed to the level of the distal esophagus where there is a complete mechanical obstruction. Despite several minutes of observation, no barium passed beyond the obstruction into the stomach. IMPRESSION: Complete mechanical obstruction in the distal third of the esophagus. Some images outline a protruding intraluminal defect that could be food lodged in the distal esophagus although soft tissue from obstructing neoplasm is also possible. Electronically Signed   By: Misty Stanley M.D.   On: 01/13/2019 16:15    Scheduled Meds:  amiodarone  200 mg Oral Daily   aspirin  81 mg Oral Daily   atorvastatin  80 mg Oral q1800   calcitRIOL  0.5 mcg Oral Q T,Th,Sa-HD   Chlorhexidine Gluconate Cloth  6 each Topical Q0600   Chlorhexidine Gluconate Cloth  6 each Topical Q0600   gabapentin  300 mg Oral QHS   metoprolol succinate  50 mg Oral Daily   multivitamin  1 tablet Oral Daily   pantoprazole  40 mg Oral BID AC   predniSONE  20 mg Oral Q breakfast   sodium chloride flush  3 mL Intravenous Q12H   sodium chloride flush  3 mL Intravenous Q12H   umeclidinium-vilanterol  1 puff Inhalation Daily    Continuous Infusions:  sodium chloride     sodium chloride     sodium chloride       Time spent: 52mins I have personally reviewed and interpreted on  01/14/2019 daily labs, tele strips, imagings as discussed above under date review session and assessment and plans.  I reviewed all nursing notes, pharmacy notes, consultant notes,  vitals, pertinent old records  I have discussed plan of care as described above with RN , patient on 01/14/2019   Florencia Reasons MD, PhD  [facility] Pager [phone]. If 7PM-7AM, please contact night-coverage at [email or web address], password [facility] 01/14/2019, 11:44 AM  LOS: 21 days

## 2019-01-14 NOTE — Telephone Encounter (Signed)
Referral and mr faxed to [facility] to Dr. Magda Bernheim; [place] [phone]

## 2019-01-14 NOTE — Progress Notes (Signed)
Cardiac monitoring order placed. Telemetry box placed on patient; box # 15.

## 2019-01-14 NOTE — Progress Notes (Signed)
PT Cancellation Note  Patient Details Name: Larry Klein MRN: [id number] DOB: March 02, 1956   Cancelled Treatment:    Reason Eval/Treat Not Completed: Patient at procedure or test/unavailable   [name] [name] 01/14/2019, 8:50 AM Elwyn Reach, PT Acute Rehabilitation Services Pager: [phone] Office: [phone]

## 2019-01-14 NOTE — Progress Notes (Signed)
Progress Note    ASSESSMENT AND PLAN:   63 yo male with food impaction, s/p EGD with removal of large fibrous food bolus in distal esophagus. Mild esophagitis seen but no obvious stricture.  -will advance to regular diet. Advised to eat small bites, chew well with fluids in between bites.  -BID PPI x 30 days then q am 30 minutes before breakfast.  -Our office will contact patient about repeat EGD in 3-4 weeks to assess for underlying stricture or other abnormalities. Will need to work around dialysis days T/Th/Sat  2. RUL PNA, aspiration? On Unasyn    SUBJECTIVE    feels okay. Coughing.   OBJECTIVE:     Vital signs in last 24 hours: Temp:  [97.3 F (36.3 C)-98.3 F (36.8 C)] 97.3 F (36.3 C) (04/30 1241) Pulse Rate:  [45-100] 60 (04/30 1247) Resp:  [14-24] 20 (04/30 1241) BP: (75-150)/(50-87) 108/50 (04/30 1241) SpO2:  [92 %-100 %] 96 % (04/30 1247) Weight:  [91.2 kg-92.1 kg] 91.2 kg (04/30 0750) Last BM Date: 01/11/19 General:   Alert, well-developed male in NAD EENT:  Normal hearing, non icteric sclera, conjunctive pink.  Heart:  Regular rate and rhythm; no murmur.  No lower extremity edema   Pulm: Normal respiratory effort, bilateral wheezing / rhonchi.  Abdomen:  Soft, nondistended, nontender.  Normal bowel sounds,.       Neurologic:  Alert and  oriented x4;  grossly normal neurologically. Psych:  Pleasant, cooperative.  Normal mood and affect.   Intake/Output from previous day: 04/29 0701 - 04/30 0700 In: 520 [P.O.:220; I.V.:300] Out: 250 [Urine:250] Intake/Output this shift: Total I/O In: 120 [P.O.:120] Out: 125 [Urine:125]  Lab Results: Recent Labs    01/11/19 1517 01/12/19 0330 01/13/19 0422  WBC  --  16.2* 22.7*  HGB 12.6* 11.3* 12.8*  HCT 37.0* 36.3* 39.8  PLT  --  101* 103*   BMET Recent Labs    01/11/19 1517 01/12/19 0330 01/13/19 0422  NA 134* 135 137  K 5.3* 5.6* 4.6  CL  --  101 98  CO2  --  18* 22  GLUCOSE 109* 130* 99   BUN  --  91* 48*  CREATININE  --  8.67* 5.94*  CALCIUM  --  8.3* 8.7*   Dg Chest Port 1 View  Result Date: 01/14/2019 CLINICAL DATA:  Shortness of breath. EXAM: PORTABLE CHEST 1 VIEW COMPARISON:  Chest x-ray dated December 22, 2018. FINDINGS: Unchanged tunneled left internal jugular dialysis catheter with tips in the mid to distal SVC. Stable cardiomegaly status post CABG. Normal pulmonary vascularity. New patchy consolidation in the right upper lobe. No pleural effusion or pneumothorax. No acute osseous abnormality. IMPRESSION: 1. Right upper lobe pneumonia. Electronically Signed   By: Titus Dubin M.D.   On: 01/14/2019 13:11   Dg Esophagus W Single Cm (sol Or Thin Ba)  Result Date: 01/13/2019 CLINICAL DATA:  Inability to swallow secretions. EXAM: ESOPHOGRAM/BARIUM SWALLOW TECHNIQUE: Single contrast examination was performed using  thin barium. FLUOROSCOPY TIME:  Fluoroscopy Time:  1 minutes and 0 seconds. Radiation Exposure Index (if provided by the fluoroscopic device): 61.4 mGy Number of Acquired Spot Images: COMPARISON:  None. FINDINGS: Patient was placed LPO relative to the fluoro table at about 30 degrees head up. He took 1 swallow of thin barium which passed to the level of the distal esophagus where there is a complete mechanical obstruction. Despite several minutes of observation, no barium passed beyond the obstruction into the stomach.  IMPRESSION: Complete mechanical obstruction in the distal third of the esophagus. Some images outline a protruding intraluminal defect that could be food lodged in the distal esophagus although soft tissue from obstructing neoplasm is also possible. Electronically Signed   By: Misty Stanley M.D.   On: 01/13/2019 16:15    Principal Problem:   Chest pain Active Problems:   Dyspnea   ESRD (end stage renal disease) on dialysis [facility])   Atrial fibrillation with RVR (HCC)   Acute on chronic systolic heart failure (HCC)   Cardiomyopathy (HCC)   PVD (peripheral  vascular disease) ([place])   Dysphagia   Esophageal obstruction due to food impaction     LOS: 21 days   Larry Klein ,NP 01/14/2019, 1:14 PM

## 2019-01-14 NOTE — Progress Notes (Addendum)
[facility] Progress Note   Subjective:   Seen on HD. S/p EGD for foreign body obstructing distal esophagus. BP soft pre-HD, will run even with no fluid removal for now. Reports he has not eaten well for several days due to dysphagia. Throat still sore. C/o worsening cough and SOB today. Denies CP, palpitations, abdominal pain, N/V. Still not much of an appetite.   Objective Vitals:   01/14/19 0750 01/14/19 0757 01/14/19 0800 01/14/19 0830  BP: (!) 85/64 (!) 103/52 (!) 80/52 (!) 103/52  Pulse: 97 100 96 84  Resp: 16     Temp: 98.3 F (36.8 C)     TempSrc: Oral     SpO2: 98%     Weight: 91.2 kg     Height:       Physical Exam General: Chronically ill appearing male, alert and in NAD Heart: RRR, no murmurs, rubs or gallops Lungs: + rhonchi bilateral lower lobes. No rales auscultated. Respirations unlabored on O2 via nasal cannula.  Abdomen: Soft, non-tender, non-distended. + BS Extremities:  1+ pitting edema b/l lower extremities. Prior TMA L leg, erythema of legs bilaterally Dialysis Access: TDC L chest. LUE AVF + bruit with surrounding bruising.   Additional Objective Labs: Basic Metabolic Panel: Recent Labs  Lab 01/09/19 0735 01/11/19 1517 01/12/19 0330 01/13/19 0422  NA 137 134* 135 137  K 4.2 5.3* 5.6* 4.6  CL 98  --  101 98  CO2 23  --  18* 22  GLUCOSE 134* 109* 130* 99  BUN 89*  --  91* 48*  CREATININE 8.02*  --  8.67* 5.94*  CALCIUM 8.4*  --  8.3* 8.7*  PHOS 4.8*  --   --   --    Liver Function Tests: Recent Labs  Lab 01/09/19 0735  ALBUMIN 3.3*   CBC: Recent Labs  Lab 01/08/19 0351 01/09/19 0735 01/11/19 1517 01/12/19 0330 01/13/19 0422  WBC 13.8* 16.1*  --  16.2* 22.7*  NEUTROABS  --  13.5*  --   --   --   HGB 11.6* 12.1* 12.6* 11.3* 12.8*  HCT 37.7* 40.1 37.0* 36.3* 39.8  MCV 92.4 93.0  --  90.1 88.4  PLT 107* 130*  --  101* 103*    CBG: Recent Labs  Lab 01/12/19 1122  GLUCAP 129*    Studies/Results: Dg Esophagus W  Single Cm (sol Or Thin Ba)  Result Date: 01/13/2019 CLINICAL DATA:  Inability to swallow secretions. EXAM: ESOPHOGRAM/BARIUM SWALLOW TECHNIQUE: Single contrast examination was performed using  thin barium. FLUOROSCOPY TIME:  Fluoroscopy Time:  1 minutes and 0 seconds. Radiation Exposure Index (if provided by the fluoroscopic device): 61.4 mGy Number of Acquired Spot Images: COMPARISON:  None. FINDINGS: Patient was placed LPO relative to the fluoro table at about 30 degrees head up. He took 1 swallow of thin barium which passed to the level of the distal esophagus where there is a complete mechanical obstruction. Despite several minutes of observation, no barium passed beyond the obstruction into the stomach. IMPRESSION: Complete mechanical obstruction in the distal third of the esophagus. Some images outline a protruding intraluminal defect that could be food lodged in the distal esophagus although soft tissue from obstructing neoplasm is also possible. Electronically Signed   By: Misty Stanley M.D.   On: 01/13/2019 16:15   Medications: . sodium chloride    . sodium chloride    . sodium chloride     . amiodarone  200 mg Oral Daily  . aspirin  81 mg Oral Daily  . atorvastatin  80 mg Oral q1800  . calcitRIOL  0.5 mcg Oral Q T,Th,Sa-HD  . Chlorhexidine Gluconate Cloth  6 each Topical Q0600  . Chlorhexidine Gluconate Cloth  6 each Topical Q0600  . gabapentin  300 mg Oral QHS  . isosorbide mononitrate  30 mg Oral Daily  . metoprolol succinate  50 mg Oral Daily  . multivitamin  1 tablet Oral Daily  . pantoprazole  40 mg Oral BID AC  . predniSONE  20 mg Oral Q breakfast  . sodium chloride flush  3 mL Intravenous Q12H  . sodium chloride flush  3 mL Intravenous Q12H  . umeclidinium-vilanterol  1 puff Inhalation Daily    Dialysis Orders: TTS at [place] [id number]/800 92.5kg 3K/2.25bath P2 LIJTDC Heparin none -Calcitriol 0.5 mcg PO TIW  Assessment/Plan: 1. CAD with unstable angina: S/p  cardiac cath, on medical therapy. Management per primary. 2. Nausea/dysphagia: DG esophagus showed obstruction of distal esophagus. S/p EGD last night with removal of obstruction. Management per GI/Primary.  3. Cough: Reports worsening cough today and rhonchi on exam. WBC elevated yesterday. Concern for possible aspiration due to dysphagia yesterday. Will order chest x-ray. Discussed with Dr. Erlinda Hong who also recommends starting unasyn. O2 sat stable at 97%.   2. ESRD: TTS schedule. K+ 4.6. BP soft, no volume removal with HD at this time. S/p LUE AVF placement on 4/27. No symptoms of steal syndrome. To follow up with [facility] in 1 month.  3. HTN/volume:  1+ edema today, 3L net UF with HD on 4/28. Still with some edema but unable to tolerate much fluid removal today due to soft BP. Chest x-ray ordered.  4. Anemia: Hgb 12.8. No ESA indicated at this time.  5. Secondary hyperparathyroidism:  Calcium 8.7, corrected 9.1. Continue calcitriol. Phos 4.8, not currently on a binder.  6. Nutrition:  Albumin 3.3. Poor appetite secondary to nausea/dysphagia at this time.  7. PAD s/p aortic stent, leg swelling: Reportedly no role for revascularization. 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  8. A.fib: Not on anticoagulation due to thrombocytopenia. Rate currently controlled. Per primary/cardiology.  9. Thromobcytopenia: ITP, on steroid taper, following up with hematology as outpatient. Will hold heparin with HD.  10. COPD: Stable dyspnea.  Anice Paganini, PA-C 01/14/2019, 10:02 AM  [facility] Pager: [phone]  Pt seen, examined and agree w A/P as above.  [facility] 01/14/2019, 4:52 PM

## 2019-01-15 ENCOUNTER — Encounter (HOSPITAL_COMMUNITY): Payer: Self-pay | Admitting: Gastroenterology

## 2019-01-15 LAB — COMPREHENSIVE METABOLIC PANEL
ALT: 10 U/L (ref 0–44)
AST: 23 U/L (ref 15–41)
Albumin: 2.8 g/dL — ABNORMAL LOW (ref 3.5–5.0)
Alkaline Phosphatase: 93 U/L (ref 38–126)
Anion gap: 18 — ABNORMAL HIGH (ref 5–15)
BUN: 42 mg/dL — ABNORMAL HIGH (ref 8–23)
CO2: 20 mmol/L — ABNORMAL LOW (ref 22–32)
Calcium: 8.5 mg/dL — ABNORMAL LOW (ref 8.9–10.3)
Chloride: 100 mmol/L (ref 98–111)
Creatinine, Ser: 5.47 mg/dL — ABNORMAL HIGH (ref 0.61–1.24)
GFR calc Af Amer: 12 mL/min — ABNORMAL LOW (ref >60)
GFR calc non Af Amer: 10 mL/min — ABNORMAL LOW (ref >60)
Glucose, Bld: 121 mg/dL — ABNORMAL HIGH (ref 70–99)
Potassium: 5.1 mmol/L (ref 3.5–5.1)
Sodium: 138 mmol/L (ref 135–145)
Total Bilirubin: 1.2 mg/dL (ref 0.3–1.2)
Total Protein: 5.6 g/dL — ABNORMAL LOW (ref 6.5–8.1)

## 2019-01-15 LAB — CBC WITH DIFFERENTIAL/PLATELET
Abs Immature Granulocytes: 0.11 10*3/uL — ABNORMAL HIGH (ref 0.00–0.07)
Basophils Absolute: 0 10*3/uL (ref 0.0–0.1)
Basophils Relative: 0 %
Eosinophils Absolute: 0 10*3/uL (ref 0.0–0.5)
Eosinophils Relative: 0 %
HCT: 34.9 % — ABNORMAL LOW (ref 39.0–52.0)
Hemoglobin: 11.1 g/dL — ABNORMAL LOW (ref 13.0–17.0)
Immature Granulocytes: 1 %
Lymphocytes Relative: 2 %
Lymphs Abs: 0.3 10*3/uL — ABNORMAL LOW (ref 0.7–4.0)
MCH: 28.5 pg (ref 26.0–34.0)
MCHC: 31.8 g/dL (ref 30.0–36.0)
MCV: 89.5 fL (ref 80.0–100.0)
Monocytes Absolute: 0.6 10*3/uL (ref 0.1–1.0)
Monocytes Relative: 4 %
Neutro Abs: 14.6 10*3/uL — ABNORMAL HIGH (ref 1.7–7.7)
Neutrophils Relative %: 93 %
Platelets: 65 10*3/uL — ABNORMAL LOW (ref 150–400)
RBC: 3.9 MIL/uL — ABNORMAL LOW (ref 4.22–5.81)
RDW: 19.4 % — ABNORMAL HIGH (ref 11.5–15.5)
WBC: 15.6 10*3/uL — ABNORMAL HIGH (ref 4.0–10.5)
nRBC: 0 % (ref 0.0–0.2)

## 2019-01-15 LAB — LACTIC ACID, PLASMA: Lactic Acid, Venous: 1.2 mmol/L (ref 0.5–1.9)

## 2019-01-15 MED ORDER — CHLORHEXIDINE GLUCONATE CLOTH 2 % EX PADS: 6.0000 | MEDICATED_PAD | Freq: Every day | CUTANEOUS | Status: DC

## 2019-01-15 NOTE — Progress Notes (Signed)
No response noted from GI MD will page again.

## 2019-01-15 NOTE — Care Management Important Message (Signed)
Important Message  Patient Details  Name: Larry Klein MRN: [id number] Date of Birth: [birth year]   Medicare Important Message Given:  Yes    Santresa Levett Montine Circle 01/15/2019, 4:06 PM

## 2019-01-15 NOTE — Evaluation (Addendum)
Physical Therapy Evaluation Patient Details Name: Larry Klein MRN: [id number] DOB: [birth year] Today's Date: 01/15/2019   History of Present Illness  Pt is a 63 y.o. 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 fib with RVR, COPD who was admitted on 12/23/2018 with complaints of chest pain and dyspnea. Cardiology felt not a candidate for surgical revascularization. 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 Pt with fall on 4/28 and endoscopy 4/29 to remove esophageal mass, LUE fistula.   Clinical Impression  Pt pleasant and eager to see therapy on arrival. Pt sharing that he is unaware of what caused his fall a few days ago but adamant that he would need crutches for increased safety not Rw. Attempted crutches with pt with instability and not safe with crutches during gait with LOB x 3 during ascent and descent of stairs requiring mod assist to prevent fall. Pt finally agreeable end of session to use RW after recognizing he did not perform as he anticipated with increased stability with recommendation for continued RW use, rails for stairs with assist and HHPT. Pt with decreased strength, transfers, gait and balance who will benefit from acute therapy to maximize safety, function and independence to decrease fall risk.   HR 119 SpO2 93% on RA    Follow Up Recommendations Home health PT    Equipment Recommendations  Rolling walker with 5" wheels    Recommendations for Other Services OT consult     Precautions / Restrictions Precautions Precautions: Fall Precaution Comments: left transmet      Mobility  Bed Mobility               General bed mobility comments: EOB on arrival  Transfers Overall transfer level: Modified independent                  Ambulation/Gait Ambulation/Gait assistance: Min guard Gait Distance (Feet): 300 Feet Assistive  device: Crutches;Rolling walker (2 wheeled) Gait Pattern/deviations: Step-through pattern;Decreased stride length;Trunk flexed   Gait velocity interpretation: 1.31 - 2.62 ft/sec, indicative of limited community ambulator General Gait Details: pt walking with crutches with reciprocal pattern with catching of tips of crutches at least 4x during gait with verbal and tactile cues to assist with balance. Cues to decrease speed, increase trunk extension and for safety. 2nd trial 150' with RW with supervision, no LOB and increased safety and stability with rw. Pt with extensive kyphosis in standing independent of DME utilized and unable to ambulate in extension  Stairs Stairs: Yes Stairs assistance: Min assist;+2 safety/equipment Stair Management: Forwards;Step to pattern;With crutches Number of Stairs: 4 General stair comments: pt stepping up with good pattern with 2 posterior LOB with ascent needing assist to maintain balance. Pt on descent with LOB anteriorly skipping a step with max assist to maintain upright and recover balance. PT educated for using rails at home and not crutches along with assist due to LOB with stairs and need for assist  Wheelchair Mobility    Modified Rankin (Stroke Patients Only)       Balance Overall balance assessment: Needs assistance   Sitting balance-Leahy Scale: Good       Standing balance-Leahy Scale: Fair Standing balance comment: able to stand without UE assist, reliant on UE support for gait  Pertinent Vitals/Pain Pain Assessment: 0-10 Pain Score: 5  Pain Location: back Pain Descriptors / Indicators: Stabbing Pain Intervention(s): Limited activity within patient's tolerance;Monitored during session;Repositioned    Home Living Family/patient expects to be discharged to:: Private residence Living Arrangements: Children Available Help at Discharge: Available 24 hours/day;Family Type of Home: Mobile home Home  Access: Stairs to enter Entrance Stairs-Rails: Psychiatric nurse of Steps: 4 Home Layout: One level Home Equipment: Cane - single point;Shower seat - built in      Prior Function Level of Independence: Independent         Comments: drives to HD T,TH,Sat     Hand Dominance        Extremity/Trunk Assessment   Upper Extremity Assessment Upper Extremity Assessment: Generalized weakness    Lower Extremity Assessment Lower Extremity Assessment: Generalized weakness LLE Deficits / Details: L TMA    Cervical / Trunk Assessment Cervical / Trunk Assessment: Kyphotic  Communication   Communication: No difficulties  Cognition Arousal/Alertness: Awake/alert Behavior During Therapy: WFL for tasks assessed/performed Overall Cognitive Status: Impaired/Different from baseline Area of Impairment: Safety/judgement                         Safety/Judgement: Decreased awareness of deficits;Decreased awareness of safety            General Comments      Exercises     Assessment/Plan    PT Assessment Patient needs continued PT services  PT Problem List Decreased strength;Decreased balance;Decreased activity tolerance;Decreased mobility;Decreased coordination;Decreased safety awareness;Decreased knowledge of use of DME       PT Treatment Interventions Gait training;Therapeutic activities;Stair training;Therapeutic exercise;DME instruction;Functional mobility training;Balance training;Patient/family education    PT Goals (Current goals can be found in the Care Plan section)  Acute Rehab PT Goals Patient Stated Goal: be able to walk and know if they are taking this leg PT Goal Formulation: With patient Time For Goal Achievement: 01/29/19 Potential to Achieve Goals: Good    Frequency Min 3X/week   Barriers to discharge        Co-evaluation               AM-PAC PT "6 Clicks" Mobility  Outcome Measure Help needed turning from your back  to your side while in a flat bed without using bedrails?: A Little Help needed moving from lying on your back to sitting on the side of a flat bed without using bedrails?: A Little Help needed moving to and from a bed to a chair (including a wheelchair)?: A Little Help needed standing up from a chair using your arms (e.g., wheelchair or bedside chair)?: A Little Help needed to walk in hospital room?: A Little Help needed climbing 3-5 steps with a railing? : A Little 6 Click Score: 18    End of Session Equipment Utilized During Treatment: Gait belt Activity Tolerance: Patient tolerated treatment well Patient left: in bed;with call bell/phone within reach Nurse Communication: Mobility status PT Visit Diagnosis: Other abnormalities of gait and mobility (R26.89);Muscle weakness (generalized) (M62.81);Unsteadiness on feet (R26.81);Difficulty in walking, not elsewhere classified (R26.2)    Time: 3149-7026 PT Time Calculation (min) (ACUTE ONLY): 45 min   Charges:   PT Evaluation $PT Eval Moderate Complexity: 1 Mod PT Treatments $Gait Training: 8-22 mins $Therapeutic Activity: 8-22 mins        Maeleigh Buschman Pam Drown, PT Acute Rehabilitation Services Pager: [phone] Office: [place] 01/15/2019, 12:10 PM

## 2019-01-15 NOTE — Anesthesia Postprocedure Evaluation (Signed)
Anesthesia Post Note  Patient: Larry Klein  Procedure(s) Performed: ESOPHAGOGASTRODUODENOSCOPY (EGD) WITH PROPOFOL (N/A ) FOREIGN BODY REMOVAL     Patient location during evaluation: PACU Anesthesia Type: General Level of consciousness: awake and alert Pain management: pain level controlled Vital Signs Assessment: post-procedure vital signs reviewed and stable Respiratory status: spontaneous breathing, nonlabored ventilation, respiratory function stable and patient connected to nasal cannula oxygen Cardiovascular status: blood pressure returned to baseline and stable Postop Assessment: no apparent nausea or vomiting Anesthetic complications: no    Last Vitals:  Vitals:   01/14/19 2101 01/15/19 0429  BP:  103/61  Pulse: 95 83  Resp: 18 16  Temp:  36.8 C  SpO2: 95% 93%    Last Pain:  Vitals:   01/15/19 0429  TempSrc: Oral  PainSc:                  Britanny Marksberry S

## 2019-01-15 NOTE — Progress Notes (Signed)
[facility] Progress Note   Subjective:   Seen in room. No new c/o's.   Objective Vitals:   01/15/19 1021 01/15/19 1113 01/15/19 1116 01/15/19 1235  BP: 112/65   105/73  Pulse: (!) 106 (!) 113 (!) 107 85  Resp: 20   18  Temp: 97.9 F (36.6 C)   97.8 F (36.6 C)  TempSrc: Oral   Oral  SpO2: 96% 93%  94%  Weight:      Height:       Physical Exam General: Chronically ill appearing male, alert and in NAD Heart: RRR, no murmurs, rubs or gallops Lungs: + rhonchi bilateral lower lobes. No rales auscultated. Respirations unlabored on O2 via nasal cannula.  Abdomen: Soft, non-tender, non-distended. + BS Extremities:  1+ pitting edema b/l lower extremities. Prior TMA L leg, erythema of legs bilaterally Dialysis Access: TDC L chest. LUE AVF + bruit with surrounding bruising.    TTS [place] 4h400/800 92.5kg 3K/2.25bath P2 LIJTDC Heparin none -Calcitriol 0.5 mcg PO TIW  Assessment/Plan: 1. CAD with unstable angina: S/p cardiac cath, on medical therapy. Management per primary. 2. Nausea/dysphagia: DG esophagus showed obstruction of distal esophagus. S/p EGD 4/29 with removal of obstruction. Management per GI/Primary.  3. Cough/ low grade fever: RUL infiltrates, suspected aspiration PNA, started IV zosyn per primary team.  2. ESRD: TTS schedule. Under dry wt. BP's soft. S/p LUE AVF placement on 4/27. No symptoms of steal syndrome. To follow up with [facility] in 1 month.  3. HTN/volume:  no BP's meds, bp's soft. Some LE edema, limited UF due to low BP's.  4. Anemia: Hgb 11. No ESA indicated at this time.  5. Secondary hyperparathyroidism:  Calcium 8.7, corrected 9.1. Continue calcitriol. Phos 4.8, not currently on a binder.  6. Nutrition:  Albumin 3.3. Poor appetite secondary to nausea/dysphagia at this time.  7. PAD s/p aortic stent, leg swelling: Reportedly no role for revascularization. 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  8. A.fib: Not on anticoagulation due to thrombocytopenia. Rate currently controlled. Per primary/cardiology.  9. Thromobcytopenia: ITP, on steroid taper, following up with hematology as outpatient. Will hold heparin with HD.  10. COPD: Stable dyspnea.    [facility] 01/15/2019, 1:26 PM Additional Objective Labs: Basic Metabolic Panel: Recent Labs  Lab 01/09/19 0735  01/12/19 0330 01/13/19 0422 01/15/19 0359  NA 137   < > 135 137 138  K 4.2   < > 5.6* 4.6 5.1  CL 98  --  101 98 100  CO2 23  --  18* 22 20*  GLUCOSE 134*   < > 130* 99 121*  BUN 89*  --  91* 48* 42*  CREATININE 8.02*  --  8.67* 5.94* 5.47*  CALCIUM 8.4*  --  8.3* 8.7* 8.5*  PHOS 4.8*  --   --   --   --    < > = values in this interval not displayed.   Liver Function Tests: Recent Labs  Lab 01/09/19 0735 01/15/19 0359  AST  --  23  ALT  --  10  ALKPHOS  --  93  BILITOT  --  1.2  PROT  --  5.6*  ALBUMIN 3.3* 2.8*   CBC: Recent Labs  Lab 01/09/19 0735  01/12/19 0330 01/13/19 0422 01/15/19 0359  WBC 16.1*  --  16.2* 22.7* 15.6*  NEUTROABS 13.5*  --   --   --  14.6*  HGB 12.1*   < > 11.3* 12.8* 11.1*  HCT 40.1   < > 36.3* 39.8 34.9*  MCV 93.0  --  90.1 88.4 89.5  PLT 130*  --  101* 103* 65*   < > = values in this interval not displayed.    CBG: Recent Labs  Lab 01/12/19 1122  GLUCAP 129*    Studies/Results: Dg Chest Port 1 View  Result Date: 01/14/2019 CLINICAL DATA:  Shortness of breath. EXAM: PORTABLE CHEST 1 VIEW COMPARISON:  Chest x-ray dated December 22, 2018. FINDINGS: Unchanged tunneled left internal jugular dialysis catheter with tips in the mid to distal SVC. Stable cardiomegaly status post CABG. Normal pulmonary vascularity. New patchy consolidation in the right upper lobe. No pleural effusion or pneumothorax. No acute osseous abnormality. IMPRESSION: 1. Right upper lobe pneumonia. Electronically Signed    By: Titus Dubin M.D.   On: 01/14/2019 13:11   Dg Esophagus W Single Cm (sol Or Thin Ba)  Result Date: 01/13/2019 CLINICAL DATA:  Inability to swallow secretions. EXAM: ESOPHOGRAM/BARIUM SWALLOW TECHNIQUE: Single contrast examination was performed using  thin barium. FLUOROSCOPY TIME:  Fluoroscopy Time:  1 minutes and 0 seconds. Radiation Exposure Index (if provided by the fluoroscopic device): 61.4 mGy Number of Acquired Spot Images: COMPARISON:  None. FINDINGS: Patient was placed LPO relative to the fluoro table at about 30 degrees head up. He took 1 swallow of thin barium which passed to the level of the distal esophagus where there is a complete mechanical obstruction. Despite several minutes of observation, no barium passed beyond the obstruction into the stomach. IMPRESSION: Complete mechanical obstruction in the distal third of the esophagus. Some images outline a protruding intraluminal defect that could be food lodged in the distal esophagus although soft tissue from obstructing neoplasm is also possible. Electronically Signed   By: Misty Stanley M.D.   On: 01/13/2019 16:15   Medications: . sodium chloride    . sodium chloride    . sodium chloride    . ampicillin-sulbactam (UNASYN) IV 3 g (01/14/19 1427)   . amiodarone  200 mg Oral Daily  . aspirin  81 mg Oral Daily  . atorvastatin  80 mg Oral q1800  . calcitRIOL  0.5 mcg Oral Q T,Th,Sa-HD  . Chlorhexidine Gluconate Cloth  6 each Topical Q0600  . Chlorhexidine Gluconate Cloth  6 each Topical Q0600  . gabapentin  300 mg Oral QHS  . guaiFENesin  600 mg Oral BID  . ipratropium  0.5 mg Nebulization Q8H  . levalbuterol  1.25 mg Nebulization Q8H  . metoprolol succinate  25 mg Oral Daily  . multivitamin  1 tablet Oral Daily  . pantoprazole  40 mg Oral BID AC  . predniSONE  20 mg Oral Q breakfast  . sodium chloride flush  3 mL Intravenous Q12H  . sodium chloride flush  3 mL Intravenous Q12H  . umeclidinium-vilanterol  1 puff  Inhalation Daily

## 2019-01-15 NOTE — Progress Notes (Signed)
PROGRESS NOTE  Larry Klein PHX:[id number] DOB: 1955/12/28 DOA: 12/23/2018 PCP: Patient, No Pcp Per  Brief Narrative: Per HPI: 63 year old male with PMH of ESRD on TTS HD at [facility], mixed ischemic and nonischemic cardiomyopathy with LVEF 20-25%, CAD status post CABG x4 in [place] 2017, persistent A. fib with RVR, chronic thrombocytopenia, anemia, COPD, HTN, admitted on 12/23/2018 with complaints of chest pain and dyspnea on exertion, initially presented to [facility] ED where CTA chest was negative for PE, transferred to [facility] for cardiology evaluation. Cardiology evaluated for unstable angina, was on heparin drip, felt not a candidate for surgical or percutaneous revascularization (prior CABG, thrombocytopenia), added Imdur and signed off. COVID testing negative. Nephrology consulting for HD needs.Vascular surgery consulted for right foot pain.Hematology consulted for thrombocytopenia. Patient was placed on a steroid trial with improvement on the platelet count.  Patient underwent cardiac cath and now on medical management, also underwent lower extremity aortogram, and no role of revascularization.    HPI/Recap of past 24 hours:   Feeling better, wants diet to be advanced, He has productive cough, no hypoxia, no fever, bp stable today, wbc trend down  plt trend down     Assessment/Plan: Principal Problem:   Chest pain Active Problems:   Dyspnea   ESRD (end stage renal disease) on dialysis [facility])   Atrial fibrillation with RVR (HCC)   Acute on chronic systolic heart failure (HCC)   Cardiomyopathy (HCC)   PVD (peripheral vascular disease) ([place])   Dysphagia   Esophageal obstruction due to food impaction   Aspiration pneumonia of left upper lobe due to gastric secretions ([place])    Dysphagia/odynophagia  -urgent EGD on 4/29 evening with removal of a large fibrous food bolus from the distal esophagus GI input appreciated  Aspiration pneumonia:  -4/30 his blood pressure is soft, wbc elevated, no fever, -cxr + RUL pneumonia, started on unasyn on 4/30  - imdur discontinued on 4/30, continue to hold, may be able to restart if bp goes up -continue  mucinex and nebs   CAD with unstable angina:  -Status post cardiac cath, plan is to continue medical therapy:asa,lipitor, imdur (hold of 4/30 due to soft blood pressure), decrease toprol with holding parameter, lipitor.  - cardiology signed off.  Acute on chronic systolic heart failure,  -LVEF 20 to 25% ECHO 3/24: Continue volume management with dialysis,, cont toprol. Appreciate cardiology input, cards signed off, needs follow-up echo in 3 months.  previous cardiology in [facility] discussed getting a device in 2019 not sure about long-term prognosis currently.    Permanent atrial fibrillation: Rate controlled chads 2 vas score of 3, continue amiodarone, beta-blocker.  Previously no anticoagulation due to low platelet.  needs to follow-up with outpatient.   Mild transaminitis in the setting of CHF.  Overall lfts stable.acute viral hepatitis panel was negative.  PAD s/p aortic stent graft, with right leg pain, leg swelling- NO DVT. S/p aortogram and bilateral lower extremity runoff noted to have occluded anterior tibial arteries b/l.S/P right lower extremity arteriogram - in line flow via PT and as per vascular surgery, no role for revascularization. Cont asa, statins,  ESRD on HD TTS w mild hyperkalemia: .  Appreciate nephrology input. 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  Appreciate hematology input and currently on steroid trial since 4/14 and platelet improving. Tapering steroid to 20 mg daily, recommendation for slow taper as outpatient, with outpatient follow-up labs at [place] [facility].  Left leg mass without vascularity MRI suggest malignant process will need referral to orthopedic oncology at [facility] for further  evaluation, oncology on board. Cont pain control.  COPD stable no wheezing. COVID 19 is negative   DVT prophylaxis: No SCD due to leg pain.  Not on pharmacologic anticoagulation due to thrombocytopenia. Code Status: Full code   Family Communication: patient , he declined my offer to call his son  Disposition Plan: not ready to discharge   Consultants:  Vascular surgery  Hematology  Nephrology  cardiology  Procedures: dialysis 4/27 LUE AVF  RIGHT/LEFT HEART CATH AND CORONARY/GRAFT ANGIOGRAPH  Prox RCA to Mid RCA lesion is 100% stenosed.  Ost LM to Mid LM lesion is 50% stenosed.  Ost Cx lesion is 95% stenosed.  Prox LAD lesion is 100% stenosed.  Ost 1st Diag to 1st Diag lesion is 70% stenosed.  Origin to Insertion lesion before 1st Diag is 100% stenoseD  Antibiotics:  perioperative   Objective: BP 116/68 (BP Location: Right Arm)   Pulse 72   Temp (!) 97.5 F (36.4 C) (Oral)   Resp 18   Ht 5\' 11"  (1.803 m)   Wt 89.6 kg Comment: A scale  SpO2 96%   BMI 27.55 kg/m   Intake/Output Summary (Last 24 hours) at 01/15/2019 2323 Last data filed at 01/15/2019 2142 Gross per 24 hour  Intake 240 ml  Output 0 ml  Net 240 ml   Filed Weights   01/14/19 0750 01/14/19 1207 01/15/19 0132  Weight: 91.2 kg 89.9 kg 89.6 kg    Exam: Patient is examined daily including today on 01/15/2019, exams remain the same as of yesterday except that has changed    General:  Does not look comfortable  Cardiovascular: RRR  Respiratory: + wheezing, rhonchi  Abdomen: Soft/ND/NT, positive BS  Musculoskeletal: trace Edema right lower extremity, left leg + soft tissue mass,  left foot h/o TMA  Many years ago  Neuro: alert, oriented   Data Reviewed: Basic Metabolic Panel: Recent Labs  Lab 01/09/19 0735 01/11/19 1517 01/12/19 0330 01/13/19 0422 01/15/19 0359  NA 137 134* 135 137 138  K 4.2 5.3* 5.6* 4.6 5.1  CL 98  --  101 98 100  CO2 23  --  18* 22 20*  GLUCOSE  134* 109* 130* 99 121*  BUN 89*  --  91* 48* 42*  CREATININE 8.02*  --  8.67* 5.94* 5.47*  CALCIUM 8.4*  --  8.3* 8.7* 8.5*  PHOS 4.8*  --   --   --   --    Liver Function Tests: Recent Labs  Lab 01/09/19 0735 01/15/19 0359  AST  --  23  ALT  --  10  ALKPHOS  --  93  BILITOT  --  1.2  PROT  --  5.6*  ALBUMIN 3.3* 2.8*   No results for input(s): LIPASE, AMYLASE in the last 168 hours. No results for input(s): AMMONIA in the last 168 hours. CBC: Recent Labs  Lab 01/09/19 0735 01/11/19 1517 01/12/19 0330 01/13/19 0422 01/15/19 0359  WBC 16.1*  --  16.2* 22.7* 15.6*  NEUTROABS 13.5*  --   --   --  14.6*  HGB 12.1* 12.6* 11.3* 12.8* 11.1*  HCT 40.1 37.0* 36.3* 39.8 34.9*  MCV 93.0  --  90.1 88.4 89.5  PLT 130*  --  101* 103* 65*   Cardiac Enzymes:   No results for input(s): CKTOTAL, CKMB, CKMBINDEX, TROPONINI in the last 168 hours. BNP (last  3 results) Recent Labs    12/07/18 2237  BNP 1,637.3*    ProBNP (last 3 results) No results for input(s): PROBNP in the last 8760 hours.  CBG: Recent Labs  Lab 01/12/19 1122  GLUCAP 129*    Recent Results (from the past 240 hour(s))  Surgical pcr screen     Status: None   Collection Time: 01/10/19  2:15 PM  Result Value Ref Range Status   MRSA, PCR NEGATIVE NEGATIVE Final   Staphylococcus aureus NEGATIVE NEGATIVE Final    Comment: (NOTE) The Xpert SA Assay (FDA approved for NASAL specimens in patients 15 years of age and older), is one component of a comprehensive surveillance program. It is not intended to diagnose infection nor to guide or monitor treatment. Performed at [facility], [place] [address] [place], [place] [address]      Studies: No results found.  Scheduled Meds: . amiodarone  200 mg Oral Daily  . aspirin  81 mg Oral Daily  . atorvastatin  80 mg Oral q1800  . calcitRIOL  0.5 mcg Oral Q T,Th,Sa-HD  . Chlorhexidine Gluconate Cloth  6 each Topical Q0600  . Chlorhexidine Gluconate Cloth  6  each Topical Q0600  . [START ON 01/16/2019] Chlorhexidine Gluconate Cloth  6 each Topical Q0600  . gabapentin  300 mg Oral QHS  . guaiFENesin  600 mg Oral BID  . ipratropium  0.5 mg Nebulization Q8H  . levalbuterol  1.25 mg Nebulization Q8H  . metoprolol succinate  25 mg Oral Daily  . multivitamin  1 tablet Oral Daily  . pantoprazole  40 mg Oral BID AC  . predniSONE  20 mg Oral Q breakfast  . sodium chloride flush  3 mL Intravenous Q12H  . sodium chloride flush  3 mL Intravenous Q12H  . umeclidinium-vilanterol  1 puff Inhalation Daily    Continuous Infusions: . sodium chloride    . sodium chloride    . sodium chloride    . ampicillin-sulbactam (UNASYN) IV 3 g (01/15/19 1613)     Time spent: 25mins I have personally reviewed and interpreted on  01/15/2019 daily labs, tele strips, imagings as discussed above under date review session and assessment and plans.  I reviewed all nursing notes, pharmacy notes, consultant notes,  vitals, pertinent old records  I have discussed plan of care as described above with RN , patient on 01/15/2019   Florencia Reasons MD, PhD  [facility] Pager [phone]. If 7PM-7AM, please contact night-coverage at [email or web address], password [email or web address] 01/15/2019, 11:23 PM  LOS: 22 days

## 2019-01-15 NOTE — Progress Notes (Signed)
Patient asking for po diet says hates clear liquid diet talking about hamburger and steaks reported to Dr.Xu she asked me page [place] on call MD ask about advancing diet. Patient informed attempting contact GI MDsee we can possibly advance diet per his request.

## 2019-01-15 NOTE — Progress Notes (Signed)
Pt is a high fall risk, refused bed/chair alarm, pt had a fall this admission, educated on the importance of being safe, but pt continued to refused bed/chair alarm. Will continue intentional rounding.

## 2019-01-16 LAB — BASIC METABOLIC PANEL
Anion gap: 19 — ABNORMAL HIGH (ref 5–15)
BUN: 61 mg/dL — ABNORMAL HIGH (ref 8–23)
CO2: 20 mmol/L — ABNORMAL LOW (ref 22–32)
Calcium: 8.9 mg/dL (ref 8.9–10.3)
Chloride: 98 mmol/L (ref 98–111)
Creatinine, Ser: 6.94 mg/dL — ABNORMAL HIGH (ref 0.61–1.24)
GFR calc Af Amer: 9 mL/min — ABNORMAL LOW (ref >60)
GFR calc non Af Amer: 8 mL/min — ABNORMAL LOW (ref >60)
Glucose, Bld: 121 mg/dL — ABNORMAL HIGH (ref 70–99)
Potassium: 4.9 mmol/L (ref 3.5–5.1)
Sodium: 137 mmol/L (ref 135–145)

## 2019-01-16 LAB — CBC
HCT: 34.9 % — ABNORMAL LOW (ref 39.0–52.0)
Hemoglobin: 11.1 g/dL — ABNORMAL LOW (ref 13.0–17.0)
MCH: 28 pg (ref 26.0–34.0)
MCHC: 31.8 g/dL (ref 30.0–36.0)
MCV: 88.1 fL (ref 80.0–100.0)
Platelets: 93 10*3/uL — ABNORMAL LOW (ref 150–400)
RBC: 3.96 MIL/uL — ABNORMAL LOW (ref 4.22–5.81)
RDW: 19.1 % — ABNORMAL HIGH (ref 11.5–15.5)
WBC: 15.7 10*3/uL — ABNORMAL HIGH (ref 4.0–10.5)
nRBC: 0 % (ref 0.0–0.2)

## 2019-01-16 MED ORDER — IPRATROPIUM BROMIDE 0.02 % IN SOLN
0.5000 mg | Freq: Three times a day (TID) | RESPIRATORY_TRACT | Status: DC
  Administered 2019-01-16 (×2): 0.5 mg via RESPIRATORY_TRACT
  Filled 2019-01-16 (×2): qty 2.5

## 2019-01-16 MED ORDER — IPRATROPIUM BROMIDE 0.02 % IN SOLN
0.5000 mg | Freq: Two times a day (BID) | RESPIRATORY_TRACT | Status: DC
  Administered 2019-01-16 – 2019-01-17 (×3): 0.5 mg via RESPIRATORY_TRACT
  Filled 2019-01-16 (×3): qty 2.5

## 2019-01-16 MED ORDER — LEVALBUTEROL HCL 1.25 MG/0.5ML IN NEBU
1.2500 mg | INHALATION_SOLUTION | Freq: Two times a day (BID) | RESPIRATORY_TRACT | Status: DC
  Administered 2019-01-16 – 2019-01-17 (×3): 1.25 mg via RESPIRATORY_TRACT
  Filled 2019-01-16 (×3): qty 0.5

## 2019-01-16 MED ORDER — LEVALBUTEROL HCL 1.25 MG/0.5ML IN NEBU
1.2500 mg | INHALATION_SOLUTION | Freq: Three times a day (TID) | RESPIRATORY_TRACT | Status: DC
  Administered 2019-01-16 (×2): 1.25 mg via RESPIRATORY_TRACT
  Filled 2019-01-16 (×2): qty 0.5

## 2019-01-16 NOTE — Progress Notes (Signed)
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 fib with RVR, chronic thrombocytopenia, anemia, COPD, HTN, admitted on 12/23/2018 with complaints of chest pain and dyspnea on exertion, initially presented to [facility] ED where CTA chest was negative for PE, transferred to [facility] for cardiology evaluation. Cardiology evaluated for unstable angina, was on heparin drip, felt not a candidate for surgical or percutaneous revascularization (prior CABG, thrombocytopenia), added Imdur and signed off. COVID testing negative. Nephrology consulting for HD needs.Vascular surgery consulted for right foot pain.Hematology consulted for thrombocytopenia. Patient was placed on a steroid trial with improvement on the platelet count.  Patient underwent cardiac cath and now on medical management, also underwent lower extremity aortogram, and no role of revascularization.    HPI/Recap of past 24 hours:  plt 97 today,bp improved, he tolerated dialysis today  No fever, still has productive cough , still has epigastric pain though he wants diet to be advanced,   Assessment/Plan: Principal Problem:   Chest pain Active Problems:   Dyspnea   ESRD (end stage renal disease) on dialysis [facility])   Atrial fibrillation with RVR (HCC)   Acute on chronic systolic heart failure (HCC)   Cardiomyopathy (HCC)   PVD (peripheral vascular disease) ([place])   Dysphagia   Esophageal obstruction due to food impaction   Aspiration pneumonia of left upper lobe due to gastric secretions ([place])    Dysphagia/odynophagia -urgent EGD on 4/29 evening with removal of a large fibrous food bolus from the distal esophagus -GI input appreciated.  -Advance diet  slowly, he need to repeat egd in a few weeks per gi   Aspiration pneumonia: -4/30 his blood pressure is soft, wbc elevated, no fever, -cxr + RUL pneumonia, started on unasyn on 4/30, continue unasyn for now, likely will transition to augmentin once able to swallow better  - imdur discontinued on 4/30, continue to hold, may be able to restart if bp goes up -continue  mucinex and nebs   CAD with unstable angina:  -Status post cardiac cath, plan is to continue medical therapy:asa,lipitor, imdur (hold of 4/30 due to soft blood pressure), decrease toprol with holding parameter, lipitor.  - cardiology signed off.  Acute on chronic systolic heart failure,  -LVEF 20 to 25% ECHO 3/24: Continue volume management with dialysis,, cont toprol. Appreciate cardiology input, cards signed off, needs follow-up echo in 3 months.  previous cardiology in [facility] discussed getting a device in 2019 not sure about long-term prognosis currently.    Permanent atrial fibrillation: Rate controlled chads 2 vas score of 3, continue amiodarone, beta-blocker.  Previously no anticoagulation due to low platelet.  needs to follow-up with outpatient.   Mild transaminitis in the setting of CHF.  Overall lfts stable.acute viral hepatitis panel was negative.  PAD s/p aortic stent graft, with right leg pain, leg swelling- NO DVT. S/p aortogram and bilateral lower extremity runoff noted to have occluded anterior tibial arteries b/l.S/P right lower extremity arteriogram - in line flow via PT and as per vascular surgery, no role for revascularization. Cont asa, statins,  ESRD on HD TTS w mild hyperkalemia: .  Appreciate nephrology input. 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  Appreciate hematology input and currently on steroid  trial since 4/14 and platelet improving. Tapering steroid to 20 mg daily, recommendation for slow taper as outpatient, with outpatient follow-up labs at  [facility].   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 Cont pain control.  COPD stable no wheezing. COVID 19 is negative   DVT prophylaxis: No SCD due to leg pain.  Not on pharmacologic anticoagulation due to thrombocytopenia. Code Status: Full code   Family Communication: patient , he declined my offer to call his son  Disposition Plan: home in 1-2 days   Consultants:  Vascular surgery  Hematology  Nephrology  cardiology  Procedures: dialysis 4/27 LUE AVF  RIGHT/LEFT HEART CATH AND CORONARY/GRAFT ANGIOGRAPH  Prox RCA to Mid RCA lesion is 100% stenosed.  Ost LM to Mid LM lesion is 50% stenosed.  Ost Cx lesion is 95% stenosed.  Prox LAD lesion is 100% stenosed.  Ost 1st Diag to 1st Diag lesion is 70% stenosed.  Origin to Insertion lesion before 1st Diag is 100% stenoseD  Antibiotics:  perioperative   Objective: BP 129/78 (BP Location: Right Arm)   Pulse 82   Temp 97.7 F (36.5 C) (Oral)   Resp 17   Ht 5\' 11"  (1.803 m)   Wt 88.4 kg Comment: stood to scale   SpO2 98%   BMI 27.18 kg/m   Intake/Output Summary (Last 24 hours) at 01/16/2019 1242 Last data filed at 01/16/2019 1150 Gross per 24 hour  Intake 485 ml  Output 1000 ml  Net -515 ml   Filed Weights   01/16/19 0056 01/16/19 0737 01/16/19 1150  Weight: 89.6 kg 89.9 kg 88.4 kg    Exam: Patient is examined daily including today on 01/16/2019, exams remain the same as of yesterday except that has changed    General:  Better, NAD  Cardiovascular: RRR  Respiratory: wheezing resolved today, occasional rhonchi improves after productive cough  Abdomen: Soft/ND/NT, positive BS  Musculoskeletal: trace Edema right lower extremity, left leg + soft tissue mass,  left foot h/o TMA  Many years ago  Neuro: alert, oriented   Data Reviewed: Basic Metabolic Panel:  Recent Labs  Lab 01/11/19 1517 01/12/19 0330 01/13/19 0422 01/15/19 0359 01/16/19 0250  NA 134* 135 137 138 137  K 5.3* 5.6* 4.6 5.1 4.9  CL  --  101 98 100 98  CO2  --  18* 22 20* 20*  GLUCOSE 109* 130* 99 121* 121*  BUN  --  91* 48* 42* 61*  CREATININE  --  8.67* 5.94* 5.47* 6.94*  CALCIUM  --  8.3* 8.7* 8.5* 8.9   Liver Function Tests: Recent Labs  Lab 01/15/19 0359  AST 23  ALT 10  ALKPHOS 93  BILITOT 1.2  PROT 5.6*  ALBUMIN 2.8*   No results for input(s): LIPASE, AMYLASE in the last 168 hours. No results for input(s): AMMONIA in the last 168 hours. CBC: Recent Labs  Lab 01/11/19 1517 01/12/19 0330 01/13/19 0422 01/15/19 0359 01/16/19 0250  WBC  --  16.2* 22.7* 15.6* 15.7*  NEUTROABS  --   --   --  14.6*  --   HGB 12.6* 11.3* 12.8* 11.1* 11.1*  HCT 37.0* 36.3* 39.8 34.9* 34.9*  MCV  --  90.1 88.4 89.5 88.1  PLT  --  101* 103* 65* 93*   Cardiac Enzymes:   No results for input(s): CKTOTAL, CKMB, CKMBINDEX, TROPONINI in the last 168 hours.  BNP (last 3 results) Recent Labs    12/07/18 2237  BNP 1,637.3*    ProBNP (last 3 results) No results for input(s): PROBNP in the last 8760 hours.  CBG: Recent Labs  Lab 01/12/19 1122  GLUCAP 129*    Recent Results (from the past 240 hour(s))  Surgical pcr screen     Status: None   Collection Time: 01/10/19  2:15 PM  Result Value Ref Range Status   MRSA, PCR NEGATIVE NEGATIVE Final   Staphylococcus aureus NEGATIVE NEGATIVE Final    Comment: (NOTE) The Xpert SA Assay (FDA approved for NASAL specimens in patients 38 years of age and older), is one component of a comprehensive surveillance program. It is not intended to diagnose infection nor to guide or monitor treatment. Performed at [facility], [place] [address] [place],  [address]      Studies: No results found.  Scheduled Meds: . amiodarone  200 mg Oral Daily  . aspirin  81 mg Oral Daily  . atorvastatin  80 mg Oral q1800  .  calcitRIOL  0.5 mcg Oral Q T,Th,Sa-HD  . Chlorhexidine Gluconate Cloth  6 each Topical Q0600  . Chlorhexidine Gluconate Cloth  6 each Topical Q0600  . Chlorhexidine Gluconate Cloth  6 each Topical Q0600  . gabapentin  300 mg Oral QHS  . guaiFENesin  600 mg Oral BID  . ipratropium  0.5 mg Nebulization TID  . levalbuterol  1.25 mg Nebulization TID  . metoprolol succinate  25 mg Oral Daily  . multivitamin  1 tablet Oral Daily  . pantoprazole  40 mg Oral BID AC  . predniSONE  20 mg Oral Q breakfast  . sodium chloride flush  3 mL Intravenous Q12H  . sodium chloride flush  3 mL Intravenous Q12H  . umeclidinium-vilanterol  1 puff Inhalation Daily    Continuous Infusions: . sodium chloride    . sodium chloride    . sodium chloride    . ampicillin-sulbactam (UNASYN) IV 3 g (01/15/19 1613)     Time spent: 73mins I have personally reviewed and interpreted on  01/16/2019 daily labs, tele strips, imagings as discussed above under date review session and assessment and plans.  I reviewed all nursing notes, pharmacy notes, consultant notes,  vitals, pertinent old records  I have discussed plan of care as described above with RN , patient on 01/16/2019   Florencia Reasons MD, PhD  [facility] Pager [phone]. If 7PM-7AM, please contact night-coverage at [email or web address], password [facility] 01/16/2019, 12:42 PM  LOS: 23 days

## 2019-01-16 NOTE — Progress Notes (Signed)
Patient back from HD, educated about importance of bad alarm and assistance since he had a fall.Pateint continues to refuse assistance and alarm.

## 2019-01-16 NOTE — Progress Notes (Signed)
Patient refusing to be on clear liquid diet. Asking for real food. Paged MD Florencia Reasons and informed her. New diet full liquid diet. Patient unhappy again stating that he wants to be back on regular diet. Paged MD awaiting on call back.

## 2019-01-16 NOTE — Progress Notes (Signed)
Pt refusing chair alarm. Pt is in chair and states that is where he sleeps and does not want an alarm going off. Pt also refusing low bed and low mats. Pt educated. Will continue to monitor pt.

## 2019-01-16 NOTE — Progress Notes (Addendum)
Subjective:  Appears comfortable on HD   Objective Vital signs in last 24 hours: Vitals:   01/16/19 0830 01/16/19 0900 01/16/19 0930 01/16/19 1000  BP: 105/74 105/66 123/77 136/67  Pulse: 95 75 76 89  Resp:      Temp:      TempSrc:      SpO2:      Weight:      Height:       Weight change: -1.615 kg Physical Exam General: Chronically ill appearing male, alert and in NAD Heart: RRR, no murmurs, rubs or gallops Lungs: + rhonchi bilateral lower lobes. No rales auscultated. Respirations unlabored on O2 via nasal cannula.  Abdomen: Soft, non-tender, non-distended. + BS Extremities: 1+ pitting edema b/l lower extremities. Prior TMA L leg, erythema of legs bilaterally Dialysis Access: TDC L chest.patent on hd / LUE AVF + bruit with surrounding bruising.  TTS [place] 4h400/800 92.5kg 3K/2.25bath P2 LIJTDC Heparin none -Calcitriol 0.5 mcg PO TIW  Problem/Plan: 1.CAD with unstable angina:S/p cardiac cath, on medical therapy. Management per primary. 2. Nausea/dysphagia:DG esophagus showed obstruction of distal esophagus. S/p EGD 4/29 with removal of obstruction. Management per GI/Primary.  3. Cough/ low grade fever: RUL infiltrates, suspected aspiration PNA, started IV zosyn per primary team.  2. ESRD:TTS schedule. Under dry wt. BP's soft. S/p LUE AVF placement on 4/27. No symptoms of steal syndrome. To follow up with [facility] in 1 month. 3.HTN/volume:no BP's meds, bp's soft. Some LE edema, limited UF due to low BP's.  4. Anemia:Hgb 11.1 No ESA indicated at this time. 5. Secondary hyperparathyroidism:Calcium 8.7, corrected 9.1. Continue calcitriol. Phos 4.8, not currently on a binder. 6. Nutrition:Albumin 3.3. Poor appetite secondary to nausea/dysphagia at this time.  7. PAD s/p aortic stent, leg swelling:Reportedly no role for revascularization. Also with lefft leg mass on MRI suggesting malignant process, plans for follow up with orthopedic oncology at [facility] after discharge.  8. A.fib:Not on anticoagulation due to thrombocytopenia. Rate currently controlled. Per primary/cardiology.  9.Thromobcytopenia: ITP, on steroid taper, following up with hematology as outpatient. Will hold heparin with HD.  10. COPD:Stable dyspnea.  Ernest Haber, PA-C [facility] [phone] 01/16/2019,11:44 AM  LOS: 23 days   Pt seen, examined and agree w A/P as above.  [facility] 01/16/2019, 12:59 PM    Labs: Basic Metabolic Panel: Recent Labs  Lab 01/13/19 0422 01/15/19 0359 01/16/19 0250  NA 137 138 137  K 4.6 5.1 4.9  CL 98 100 98  CO2 22 20* 20*  GLUCOSE 99 121* 121*  BUN 48* 42* 61*  CREATININE 5.94* 5.47* 6.94*  CALCIUM 8.7* 8.5* 8.9   Liver Function Tests: Recent Labs  Lab 01/15/19 0359  AST 23  ALT 10  ALKPHOS 93  BILITOT 1.2  PROT 5.6*  ALBUMIN 2.8*   No results for input(s): LIPASE, AMYLASE in the last 168 hours. No results for input(s): AMMONIA in the last 168 hours. CBC: Recent Labs  Lab 01/12/19 0330 01/13/19 0422 01/15/19 0359 01/16/19 0250  WBC 16.2* 22.7* 15.6* 15.7*  NEUTROABS  --   --  14.6*  --   HGB 11.3* 12.8* 11.1* 11.1*  HCT 36.3* 39.8 34.9* 34.9*  MCV 90.1 88.4 89.5 88.1  PLT 101* 103* 65* 93*   Cardiac Enzymes: No results for input(s): CKTOTAL, CKMB, CKMBINDEX, TROPONINI in the last 168 hours. CBG: Recent Labs  Lab 01/12/19 1122  GLUCAP 129*    Studies/Results: Dg Chest Port 1 View  Result Date:  01/14/2019 CLINICAL DATA:  Shortness of breath. EXAM: PORTABLE CHEST 1 VIEW COMPARISON:  Chest x-ray dated December 22, 2018. FINDINGS: Unchanged tunneled left internal jugular dialysis catheter with tips in the mid to distal SVC. Stable cardiomegaly status post CABG. Normal pulmonary vascularity. New patchy consolidation in the right upper lobe. No pleural effusion or pneumothorax. No acute osseous abnormality. IMPRESSION: 1. Right upper lobe pneumonia.  Electronically Signed   By: Titus Dubin M.D.   On: 01/14/2019 13:11   Medications: . sodium chloride    . sodium chloride    . sodium chloride    . ampicillin-sulbactam (UNASYN) IV 3 g (01/15/19 1613)   . amiodarone  200 mg Oral Daily  . aspirin  81 mg Oral Daily  . atorvastatin  80 mg Oral q1800  . calcitRIOL  0.5 mcg Oral Q T,Th,Sa-HD  . Chlorhexidine Gluconate Cloth  6 each Topical Q0600  . Chlorhexidine Gluconate Cloth  6 each Topical Q0600  . Chlorhexidine Gluconate Cloth  6 each Topical Q0600  . gabapentin  300 mg Oral QHS  . guaiFENesin  600 mg Oral BID  . ipratropium  0.5 mg Nebulization TID  . levalbuterol  1.25 mg Nebulization TID  . metoprolol succinate  25 mg Oral Daily  . multivitamin  1 tablet Oral Daily  . pantoprazole  40 mg Oral BID AC  . predniSONE  20 mg Oral Q breakfast  . sodium chloride flush  3 mL Intravenous Q12H  . sodium chloride flush  3 mL Intravenous Q12H  . umeclidinium-vilanterol  1 puff Inhalation Daily

## 2019-01-16 NOTE — Progress Notes (Signed)
OT Cancellation Note  Patient Details Name: [name] MRN: [id number] DOB: [birth year]   Cancelled Treatment:    Reason Eval/Treat Not Completed: Patient at procedure or test/ unavailable.  Pt in HD.  Will reattempt.  Lucille Passy, OTR/L Acute Rehabilitation Services Pager [phone] Office [phone]   Lucille Passy M 01/16/2019, 9:56 AM

## 2019-01-16 NOTE — Progress Notes (Signed)
Patient agrees with current diet per MD

## 2019-01-16 NOTE — Progress Notes (Addendum)
Patient provided with container for sputum collection/at bedside, instructed how to collect it

## 2019-01-16 NOTE — Progress Notes (Signed)
PT Cancellation Note  Patient Details Name: Larry Klein MRN: [id number] DOB: 10/17/1955   Cancelled Treatment:    Reason Eval/Treat Not Completed: (P) Patient at procedure or test/unavailable Pt off floor in HD. PT will follow back this afternoon as able.  Khala Tarte B. Migdalia Dk PT, DPT Acute Rehabilitation Services Pager [phone] Office [phone]    [place] 01/16/2019, 11:58 AM

## 2019-01-16 NOTE — Progress Notes (Signed)
Pt is high fall risk, previous fall this admission, refusing bed alarm, assistance and low bed. Patient educated however he is still refusing. Will continue to round on patient.

## 2019-01-17 LAB — EXPECTORATED SPUTUM ASSESSMENT W GRAM STAIN, RFLX TO RESP C

## 2019-01-17 LAB — CBC
HCT: 33.9 % — ABNORMAL LOW (ref 39.0–52.0)
Hemoglobin: 10.8 g/dL — ABNORMAL LOW (ref 13.0–17.0)
MCH: 28 pg (ref 26.0–34.0)
MCHC: 31.9 g/dL (ref 30.0–36.0)
MCV: 87.8 fL (ref 80.0–100.0)
Platelets: 73 10*3/uL — ABNORMAL LOW (ref 150–400)
RBC: 3.86 MIL/uL — ABNORMAL LOW (ref 4.22–5.81)
RDW: 19.1 % — ABNORMAL HIGH (ref 11.5–15.5)
WBC: 12.8 10*3/uL — ABNORMAL HIGH (ref 4.0–10.5)
nRBC: 0.2 % (ref 0.0–0.2)

## 2019-01-17 NOTE — Progress Notes (Signed)
Subjective:  Seen in room, bathing himself in [facility], no c/o's today. Swallowing better but not "back to normal".    Objective Vital signs in last 24 hours: Vitals:   01/17/19 0909 01/17/19 0910 01/17/19 0911 01/17/19 1200  BP:    112/79  Pulse:   83 73  Resp:   18 16  Temp:    97.8 F (36.6 C)  TempSrc:    Oral  SpO2: 95% 95% 95% 95%  Weight:      Height:       Weight change: 0.315 kg Physical Exam General: Chronically ill appearing male, alert and in NAD Heart: RRR, no murmurs, rubs or gallops Lungs: + rhonchi bilateral lower lobes. No rales auscultated. Respirations unlabored on O2 via nasal cannula.  Abdomen: Soft, non-tender, non-distended. + BS Extremities: 1+ pitting edema b/l lower extremities. Prior TMA L leg, erythema of legs bilaterally Dialysis Access: TDC L chest.patent on hd / LUE AVF + bruit with surrounding bruising.  TTS [place] 4h400/800 92.5kg 3K/2.25bath P2 LIJTDC Heparin none -Calcitriol 0.5 mcg PO TIW  Problem/Plan: 1.CAD with unstable angina:S/p cardiac cath, on medical therapy. Management per primary. 2. Nausea/dysphagia:DG esophagus showed obstruction of distal esophagus. S/p EGD 4/29 with removal of obstruction. Management per GI/Primary.  3. RUL aspiration PNA: in hospital Dx, on IV Unasyn , per primary team.  2. ESRD:TTS schedule. Next HD Tuesday. Pt started HD in [place] 12/2017, moved here Jan 2020 and set up at [facility] HD.  S/p L BC AVF placement on 4/27 (L RC AVF Jan '20 didn't work). To follow up with [facility] in 1 month. 3.HTN/volume:Under dry wt here. BP's soft. Lower dry wt dc. Some LE edema, limited UF due to low BP's.  4. Anemia ckd:Hgb 11.1 No ESA indicated at this time. 5. Secondary hyperparathyroidism:Calcium 8.7, corrected 9.1. Continue calcitriol. Phos 4.8, not currently on a binder. 6. Nutrition:Albumin 3.3. Poor appetite secondary to nausea/dysphagia at this time.  7. PAD s/p aortic stent, leg  swelling:Reportedly no role for revascularization. Also with lefft leg mass on MRI suggesting malignant process, plans for follow up with orthopedic oncology at [facility] after discharge.  8. A.fib:Not on anticoagulation due to thrombocytopenia. Rate currently controlled. Per primary/cardiology.  9.Thromobcytopenia: seen by Hem-Onc and dx'd with ITP, on steroid taper, plts better.  10. COPD:Stable dyspnea.   [facility] 01/17/2019, 12:12 PM    Labs: Basic Metabolic Panel: Recent Labs  Lab 01/13/19 0422 01/15/19 0359 01/16/19 0250  NA 137 138 137  K 4.6 5.1 4.9  CL 98 100 98  CO2 22 20* 20*  GLUCOSE 99 121* 121*  BUN 48* 42* 61*  CREATININE 5.94* 5.47* 6.94*  CALCIUM 8.7* 8.5* 8.9   Liver Function Tests: Recent Labs  Lab 01/15/19 0359  AST 23  ALT 10  ALKPHOS 93  BILITOT 1.2  PROT 5.6*  ALBUMIN 2.8*   No results for input(s): LIPASE, AMYLASE in the last 168 hours. No results for input(s): AMMONIA in the last 168 hours. CBC: Recent Labs  Lab 01/12/19 0330 01/13/19 0422 01/15/19 0359 01/16/19 0250 01/17/19 0814  WBC 16.2* 22.7* 15.6* 15.7* 12.8*  NEUTROABS  --   --  14.6*  --   --   HGB 11.3* 12.8* 11.1* 11.1* 10.8*  HCT 36.3* 39.8 34.9* 34.9* 33.9*  MCV 90.1 88.4 89.5 88.1 87.8  PLT 101* 103* 65* 93* 73*   Cardiac Enzymes: No results for input(s): CKTOTAL, CKMB, CKMBINDEX, TROPONINI in the last 168 hours. CBG:  Recent Labs  Lab 01/12/19 1122  GLUCAP 129*    Studies/Results: No results found. Medications: . sodium chloride    . sodium chloride    . sodium chloride    . ampicillin-sulbactam (UNASYN) IV Stopped (01/16/19 1603)   . amiodarone  200 mg Oral Daily  . aspirin  81 mg Oral Daily  . atorvastatin  80 mg Oral q1800  . calcitRIOL  0.5 mcg Oral Q T,Th,Sa-HD  . Chlorhexidine Gluconate Cloth  6 each Topical Q0600  . Chlorhexidine Gluconate Cloth  6 each Topical Q0600  . Chlorhexidine Gluconate Cloth  6 each Topical  Q0600  . gabapentin  300 mg Oral QHS  . guaiFENesin  600 mg Oral BID  . ipratropium  0.5 mg Nebulization BID  . levalbuterol  1.25 mg Nebulization BID  . metoprolol succinate  25 mg Oral Daily  . multivitamin  1 tablet Oral Daily  . pantoprazole  40 mg Oral BID AC  . predniSONE  20 mg Oral Q breakfast  . sodium chloride flush  3 mL Intravenous Q12H  . sodium chloride flush  3 mL Intravenous Q12H  . umeclidinium-vilanterol  1 puff Inhalation Daily

## 2019-01-17 NOTE — Progress Notes (Signed)
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 fib with RVR, chronic thrombocytopenia, anemia, COPD, HTN, admitted on 12/23/2018 with complaints of chest pain and dyspnea on exertion, initially presented to [facility] ED where CTA chest was negative for PE, transferred to [facility] for cardiology evaluation. Cardiology evaluated for unstable angina, was on heparin drip, felt not a candidate for surgical or percutaneous revascularization (prior CABG, thrombocytopenia), added Imdur and signed off. COVID testing negative. Nephrology consulting for HD needs.Vascular surgery consulted for right foot pain.Hematology consulted for thrombocytopenia. Patient was placed on a steroid trial with improvement on the platelet count.  Patient underwent cardiac cath and now on medical management, also underwent lower extremity aortogram, and no role of revascularization.    HPI/Recap of past 24 hours:  plt 73 today,bp improved,   No fever, less productive cough , less  epigastric pain though he wants diet to be advanced,   Assessment/Plan: Principal Problem:   Chest pain Active Problems:   Dyspnea   ESRD (end stage renal disease) on dialysis [facility])   Atrial fibrillation with RVR (HCC)   Acute on chronic systolic heart failure (HCC)   Cardiomyopathy (HCC)   PVD (peripheral vascular disease) ([place])   Dysphagia   Esophageal obstruction due to food impaction   Aspiration pneumonia of left upper lobe due to gastric secretions ([place])    Dysphagia/odynophagia -urgent EGD on 4/29 evening with removal of a large fibrous food bolus from the distal esophagus -GI input appreciated.  -Advance diet slowly, he need to repeat egd in a  few weeks per gi   Aspiration pneumonia: -4/30 his blood pressure is soft, wbc elevated, no fever, -cxr + RUL pneumonia, started on unasyn on 4/30, continue unasyn for now, likely will transition to augmentin once able to swallow better, plan for total of 7 days abx treatment  - imdur discontinued on 4/30, continue to hold, may be able to restart if bp goes up -continue  mucinex and nebs   CAD with unstable angina:  -Status post cardiac cath, plan is to continue medical therapy:asa,lipitor, imdur (hold of 4/30 due to soft blood pressure), decrease toprol with holding parameter. - cardiology signed off.  Acute on chronic systolic heart failure,  -LVEF 20 to 25% ECHO 3/24: Continue volume management with dialysis,, cont toprol. Appreciate cardiology input, cards signed off, needs follow-up echo in 3 months.  previous cardiology in [facility] discussed getting a device in 2019 not sure about long-term prognosis currently.    Permanent atrial fibrillation: Rate controlled chads 2 vas score of 3, continue amiodarone, beta-blocker.  Previously no anticoagulation due to low platelet.  needs to follow-up with outpatient.   Mild transaminitis in the setting of CHF.  Overall lfts stable.acute viral hepatitis panel was negative.  PAD s/p aortic stent graft, with right leg pain, leg swelling- NO DVT. S/p aortogram and bilateral lower extremity runoff noted to have occluded anterior tibial arteries b/l.S/P right lower extremity arteriogram - in line flow via PT and as per vascular surgery, no role for revascularization. Cont asa, statins,  ESRD on HD TTS w mild hyperkalemia: .  Appreciate nephrology input. 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  Appreciate hematology input and currently  on steroid trial since 4/14 and platelet improving. Tapering steroid to 20 mg daily, recommendation for slow taper as outpatient, with outpatient follow-up labs at  [place] [facility].   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 Cont pain control.  COPD stable no wheezing. COVID 19 is negative   DVT prophylaxis: No SCD due to leg pain.  Not on pharmacologic anticoagulation due to thrombocytopenia. Code Status: Full code   Family Communication: patient , he declined my offer to call his son  Disposition Plan: home in 1-2 days   Consultants:  Vascular surgery  Hematology  Nephrology  cardiology  Procedures: dialysis 4/27 LUE AVF  RIGHT/LEFT HEART CATH AND CORONARY/GRAFT ANGIOGRAPH  Prox RCA to Mid RCA lesion is 100% stenosed.  Ost LM to Mid LM lesion is 50% stenosed.  Ost Cx lesion is 95% stenosed.  Prox LAD lesion is 100% stenosed.  Ost 1st Diag to 1st Diag lesion is 70% stenosed.  Origin to Insertion lesion before 1st Diag is 100% stenoseD  Antibiotics:  perioperative   Objective: BP 104/81 (BP Location: Right Arm)   Pulse 82   Temp 98 F (36.7 C)   Resp 16   Ht 5\' 11"  (1.803 m)   Wt 88.1 kg Comment: scale a  SpO2 97%   BMI 27.09 kg/m   Intake/Output Summary (Last 24 hours) at 01/17/2019 2051 Last data filed at 01/17/2019 1700 Gross per 24 hour  Intake 340 ml  Output 0 ml  Net 340 ml   Filed Weights   01/16/19 0737 01/16/19 1150 01/17/19 0449  Weight: 89.9 kg 88.4 kg 88.1 kg    Exam: Patient is examined daily including today on 01/17/2019, exams remain the same as of yesterday except that has changed    General:  Better, NAD  Cardiovascular: RRR  Respiratory: good aeration ,no wheezing , occasional rhonchi right upper lobe, improves after productive cough  Abdomen: Soft/ND/NT, positive BS  Musculoskeletal: trace Edema right lower extremity, left leg + soft tissue mass,  left foot h/o TMA  Many years ago  Neuro: alert, oriented   Data Reviewed: Basic Metabolic  Panel: Recent Labs  Lab 01/11/19 1517 01/12/19 0330 01/13/19 0422 01/15/19 0359 01/16/19 0250  NA 134* 135 137 138 137  K 5.3* 5.6* 4.6 5.1 4.9  CL  --  101 98 100 98  CO2  --  18* 22 20* 20*  GLUCOSE 109* 130* 99 121* 121*  BUN  --  91* 48* 42* 61*  CREATININE  --  8.67* 5.94* 5.47* 6.94*  CALCIUM  --  8.3* 8.7* 8.5* 8.9   Liver Function Tests: Recent Labs  Lab 01/15/19 0359  AST 23  ALT 10  ALKPHOS 93  BILITOT 1.2  PROT 5.6*  ALBUMIN 2.8*   No results for input(s): LIPASE, AMYLASE in the last 168 hours. No results for input(s): AMMONIA in the last 168 hours. CBC: Recent Labs  Lab 01/12/19 0330 01/13/19 0422 01/15/19 0359 01/16/19 0250 01/17/19 0814  WBC 16.2* 22.7* 15.6* 15.7* 12.8*  NEUTROABS  --   --  14.6*  --   --   HGB 11.3* 12.8* 11.1* 11.1* 10.8*  HCT 36.3* 39.8 34.9* 34.9* 33.9*  MCV 90.1 88.4 89.5 88.1 87.8  PLT 101* 103* 65* 93* 73*   Cardiac Enzymes:   No results for input(s): CKTOTAL, CKMB, CKMBINDEX, TROPONINI in the last 168 hours. BNP (last  3 results) Recent Labs    12/07/18 2237  BNP 1,637.3*    ProBNP (last 3 results) No results for input(s): PROBNP in the last 8760 hours.  CBG: Recent Labs  Lab 01/12/19 1122  GLUCAP 129*    Recent Results (from the past 240 hour(s))  Surgical pcr screen     Status: None   Collection Time: 01/10/19  2:15 PM  Result Value Ref Range Status   MRSA, PCR NEGATIVE NEGATIVE Final   Staphylococcus aureus NEGATIVE NEGATIVE Final    Comment: (NOTE) The Xpert SA Assay (FDA approved for NASAL specimens in patients 15 years of age and older), is one component of a comprehensive surveillance program. It is not intended to diagnose infection nor to guide or monitor treatment. Performed at [facility], [place] [address] [place], [place] [address]   Expectorated sputum assessment w rflx to resp cult     Status: None   Collection Time: 01/17/19  9:22 AM  Result Value Ref Range Status   Specimen  Description EXPECTORATED SPUTUM  Final   Special Requests NONE  Final   Sputum evaluation   Final    THIS SPECIMEN IS ACCEPTABLE FOR SPUTUM CULTURE Performed at [facility], [place] [address], [place], [place] [address]    Report Status 01/17/2019 FINAL  Final  Culture, respiratory     Status: None (Preliminary result)   Collection Time: 01/17/19  9:22 AM  Result Value Ref Range Status   Specimen Description EXPECTORATED SPUTUM  Final   Special Requests NONE Reflexed from [id number]  Final   Gram Stain   Final    ABUNDANT WBC PRESENT, PREDOMINANTLY PMN GRAM NEGATIVE RODS Performed at [facility], [place] [address] [place], [place] [address]    Culture PENDING  Incomplete   Report Status PENDING  Incomplete     Studies: No results found.  Scheduled Meds: . amiodarone  200 mg Oral Daily  . aspirin  81 mg Oral Daily  . atorvastatin  80 mg Oral q1800  . calcitRIOL  0.5 mcg Oral Q T,Th,Sa-HD  . Chlorhexidine Gluconate Cloth  6 each Topical Q0600  . Chlorhexidine Gluconate Cloth  6 each Topical Q0600  . Chlorhexidine Gluconate Cloth  6 each Topical Q0600  . gabapentin  300 mg Oral QHS  . guaiFENesin  600 mg Oral BID  . ipratropium  0.5 mg Nebulization BID  . levalbuterol  1.25 mg Nebulization BID  . metoprolol succinate  25 mg Oral Daily  . multivitamin  1 tablet Oral Daily  . pantoprazole  40 mg Oral BID AC  . predniSONE  20 mg Oral Q breakfast  . sodium chloride flush  3 mL Intravenous Q12H  . sodium chloride flush  3 mL Intravenous Q12H  . umeclidinium-vilanterol  1 puff Inhalation Daily    Continuous Infusions: . sodium chloride Stopped (01/17/19 1715)  . sodium chloride    . sodium chloride    . ampicillin-sulbactam (UNASYN) IV Stopped (01/17/19 1715)     Time spent: 3mins I have personally reviewed and interpreted on  01/17/2019 daily labs, tele strips, imagings as discussed above under date review session and assessment and plans.  I reviewed all nursing  notes, pharmacy notes, consultant notes,  vitals, pertinent old records  I have discussed plan of care as described above with RN , patient on 01/17/2019   Florencia Reasons MD, PhD  [facility] Pager [phone]. If 7PM-7AM, please contact night-coverage at [email or web address], password [facility]  01/17/2019, 8:51 PM  LOS: 24 days

## 2019-01-17 NOTE — Progress Notes (Signed)
Pt refusing bed alarm. Will continue to monitor.

## 2019-01-17 NOTE — Evaluation (Addendum)
Occupational Therapy Evaluation Patient Details Name: Larry Klein MRN: [id number] DOB: 1956/04/16 Today's Date: 01/17/2019    History of Present Illness Pt is a 63 y.o. 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 fib with RVR, COPD who was admitted on 12/23/2018 with complaints of chest pain and dyspnea. Cardiology felt not a candidate for surgical revascularization. 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 Pt with fall on 4/28 and endoscopy 4/29 to remove esophageal mass, LUE fistula.    Clinical Impression   Pt was seen for initial evaluation.  He is mostly at a set up/supervision level.  Pt wanted to walk more, in the hall, after ambulating in room with OT. He wants to be well and have no chance of giving family pna prior to home. He shared his frustration about not feeling well and feeling like he will be "kicked out" of the hospital. He does not want to be a burden on his family at all.  He is at a supervision/set up level now. Will follow in acute setting with mod I level goals    Follow Up Recommendations  Supervision/Assistance - 24 hour     Equipment Recommendations  None recommended by OT    Recommendations for Other Services       Precautions / Restrictions Precautions Precautions: Fall Precaution Comments: left transmet Restrictions Weight Bearing Restrictions: No      Mobility Bed Mobility               General bed mobility comments: eob  Transfers Overall transfer level: Modified independent               General transfer comment: with and without walker. When using walker, cues for hand placement    Balance             Standing balance-Leahy Scale: Fair                             ADL either performed or assessed with clinical judgement   ADL Overall ADL's : Needs assistance/impaired                                        General ADL Comments: setup/supervision level for adls.  Pt is used to not using any AD     Vision         Perception     Praxis      Pertinent Vitals/Pain Pain Assessment: No/denies pain, until end of session, then c/o back pain 6/10 after walking. Repositioned.     Hand Dominance     Extremity/Trunk Assessment Upper Extremity Assessment Upper Extremity Assessment: Overall WFL for tasks assessed           Communication Communication Communication: No difficulties   Cognition Arousal/Alertness: Awake/alert Behavior During Therapy: WFL for tasks assessed/performed Overall Cognitive Status: Impaired/Different from baseline Area of Impairment: Safety/judgement                                   General Comments  ambulated in room.  Requested to walk in hall at end of session.  Pt frustrated about not feeling well.  He doesn't want to go home until  he is better.  He has a new 43 week old granddaughter at home    Exercises     Should[place] expects to be discharged to:: Private residence Living Arrangements: Children Available Help at Discharge: Available 24 hours/day;Family               Bathroom Shower/Tub: Walk-in shower   Bathroom Toilet: Handicapped height     Home Equipment: [place] - single point;Shower seat - built in   Additional Comments: drives himself to HD      Prior Functioning/Environment Level of [place]: [place]        Comments: drives to HD T,TH,Sat        OT Problem List: Decreased strength;Decreased activity tolerance;Impaired balance (sitting and/or standing)      OT Treatment/Interventions: Self-care/ADL training;Energy conservation;DME and/or AE instruction;Therapeutic activities;Patient/family education;Balance training    OT Goals(Current goals can be found in the care plan section) Acute Rehab OT Goals Patient Stated  Goal: get better; feel better before going home OT Goal Formulation: With patient/family Time For Goal Achievement: 01/31/19 Potential to Achieve Goals: Good ADL Goals Pt Will Transfer to Toilet: with modified independence;ambulating(high) Pt Will Perform Tub/Shower Transfer: Shower transfer;shower seat;with modified independence Additional ADL Goal #1: pt will gather clothes and complete adl at mod I level  OT Frequency: Min 2X/week   Barriers to D/C:            Co-evaluation              AM-PAC OT "6 Clicks" Daily Activity     Outcome Measure Help from another person eating meals?: None Help from another person taking care of personal grooming?: A Little Help from another person toileting, which includes using toliet, bedpan, or urinal?: A Little Help from another person bathing (including washing, rinsing, drying)?: A Little Help from another person to put on and taking off regular upper body clothing?: A Little Help from another person to put on and taking off regular lower body clothing?: A Little 6 Click Score: 19   End of Session    Activity Tolerance: Patient tolerated treatment well Patient left: (eob)  OT Visit Diagnosis: Unsteadiness on feet (R26.81)                Time: 7741-2878 OT Time Calculation (min): 36 min Charges:  OT General Charges $OT Visit: 1 Visit OT Evaluation $OT Eval Low Complexity: 1 Low OT Treatments $Self Care/Home Management : 8-22 mins  Lesle Chris, OTR/L Acute Rehabilitation Services [phone] [facility] pager [phone] office 01/17/2019  [place] 01/17/2019, 4:41 PM

## 2019-01-17 NOTE — Progress Notes (Signed)
Patient refusing bed alarm,low bed, mats and hospital socks. Educated several times but he is still refusing. Will continue to monitor.  Reyaansh Merlo, RN

## 2019-01-18 ENCOUNTER — Other Ambulatory Visit: Payer: Self-pay

## 2019-01-18 ENCOUNTER — Telehealth: Payer: Self-pay

## 2019-01-18 DIAGNOSIS — R1310 Dysphagia, unspecified: Secondary | ICD-10-CM

## 2019-01-18 LAB — CBC
HCT: 33.9 % — ABNORMAL LOW (ref 39.0–52.0)
Hemoglobin: 11 g/dL — ABNORMAL LOW (ref 13.0–17.0)
MCH: 28.6 pg (ref 26.0–34.0)
MCHC: 32.4 g/dL (ref 30.0–36.0)
MCV: 88.3 fL (ref 80.0–100.0)
Platelets: 86 10*3/uL — ABNORMAL LOW (ref 150–400)
RBC: 3.84 MIL/uL — ABNORMAL LOW (ref 4.22–5.81)
RDW: 19.4 % — ABNORMAL HIGH (ref 11.5–15.5)
WBC: 11.7 10*3/uL — ABNORMAL HIGH (ref 4.0–10.5)
nRBC: 0 % (ref 0.0–0.2)

## 2019-01-18 LAB — GLUCOSE, CAPILLARY: Glucose-Capillary: 151 mg/dL — ABNORMAL HIGH (ref 70–99)

## 2019-01-18 MED ORDER — LEVALBUTEROL HCL 1.25 MG/0.5ML IN NEBU
1.2500 mg | INHALATION_SOLUTION | Freq: Two times a day (BID) | RESPIRATORY_TRACT | Status: DC | PRN
  Filled 2019-01-18: qty 0.5

## 2019-01-18 MED ORDER — IPRATROPIUM BROMIDE 0.02 % IN SOLN: 0.5000 mg | Freq: Two times a day (BID) | RESPIRATORY_TRACT | Status: DC | PRN

## 2019-01-18 NOTE — Progress Notes (Signed)
RT Note:  Entered patient room, patient was bent over walker.  Patient stated he was SOB and he was wheezing.  LIstened to patient BS, no wheezing, patient Clear and Diminished.  Explained to patient that he was clear but he still wanted breathing treatment.  Gave treatment.  Will continue to monitor.

## 2019-01-18 NOTE — Progress Notes (Signed)
Physical Therapy Treatment Patient Details Name: Larry Klein MRN: [id number] DOB: 09/05/1956 Today's Date: 01/18/2019    History of Present Illness Pt is a 63 y.o. 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 fib with RVR, COPD who was admitted on 12/23/2018 with complaints of chest pain and dyspnea. Cardiology felt not a candidate for surgical revascularization. 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 Pt with fall on 4/28 and endoscopy 4/29 to remove esophageal mass, LUE fistula.     PT Comments    Pt with increased frustration/agitation today due to additional fall and feeling like he doesn't have any answers as to why his functional status is continuing to decline with repeated falls.  Pt tangential with decreased problem solving, insight and rationale for circumstances safety and function. Pt reporting he lives with 2 kids and 3 grandkids and in the next sentence stating he lives with [employer] officers with guns who won't allow anyone into the house and that he is alone during the day. Pt with progressive decline in function and cognition who does not demonstrate ability to be home alone and SNF would be appropriate to maximize safety and function. Pt reports he will deny any HH services but is agreeable to SNF. Pt very fluctuant with demeanor and rationale throughout session today.   Follow Up Recommendations  SNF;Supervision/Assistance - 24 hour     Equipment Recommendations  Rolling walker with 5" wheels    Recommendations for Other Services       Precautions / Restrictions Precautions Precautions: Fall Precaution Comments: left transmet, pt with falls x 2 this hospitalization    Mobility  Bed Mobility Overal bed mobility: Modified Independent             General bed mobility comments: supine to and from sitting  Transfers Overall  transfer level: Needs assistance   Transfers: Sit to/from Stand Sit to Stand: Min guard         General transfer comment: cues for hand placement and safety. PT would not allow fall matt to be removed from floor prior to standing. X 2 trials with initial trial with posterior bias and reliance on LE support from bed. Improved standing balance on 2nd trial  Ambulation/Gait Ambulation/Gait assistance: Min assist Gait Distance (Feet): 200 Feet Assistive device: Rolling walker (2 wheeled) Gait Pattern/deviations: Step-through pattern;Decreased stride length;Trunk flexed;Wide base of support   Gait velocity interpretation: 1.31 - 2.62 ft/sec, indicative of limited community ambulator General Gait Details: pt with maintained trunk flexion, too posterior to RW with cues for posture and position on RW with pt defiant and not willing to follow cues for safety. Pt with decreased speed, safety and activity tolerance with reliance on RW for stability   Stairs             Wheelchair Mobility    Modified Rankin (Stroke Patients Only)       Balance Overall balance assessment: Needs assistance;History of Falls Sitting-balance support: Feet supported Sitting balance-Leahy Scale: Good       Standing balance-Leahy Scale: Poor Standing balance comment: able to stand without UE assist, reliant on UE support for gait                            Cognition Arousal/Alertness: Awake/alert Behavior During Therapy: Flat affect Overall Cognitive Status: Impaired/Different from baseline Area of Impairment:  Safety/judgement;Memory;Following commands                     Memory: Decreased short-term memory Following Commands: Follows one step commands consistently Safety/Judgement: Decreased awareness of deficits;Decreased awareness of safety     General Comments: pt with very poor insight into function and deficits, tangential at times, poor problem solving, pt  perseverating on fall this am yet discussing first fall then relaying varied reports of how today's fall occurrec      Exercises General Exercises - Lower Extremity Long Arc Quad: AROM;10 reps;Seated;Both Hip Flexion/Marching: AROM;10 reps;Seated;Both Other Exercises Other Exercises: standing marching with RW x 10 reps AROM, bil     General Comments        Pertinent Vitals/Pain Pain Score: 4  Pain Location: bil LE Pain Descriptors / Indicators: Aching;Discomfort Pain Intervention(s): Limited activity within patient's tolerance;Monitored during session;Repositioned    Home Living                      Prior Function            PT Goals (current goals can now be found in the care plan section) Progress towards PT goals: Not progressing toward goals - comment    Frequency    Min 3X/week      PT Plan Discharge plan needs to be updated    Co-evaluation              AM-PAC PT "6 Clicks" Mobility   Outcome Measure  Help needed turning from your back to your side while in a flat bed without using bedrails?: None Help needed moving from lying on your back to sitting on the side of a flat bed without using bedrails?: A Little Help needed moving to and from a bed to a chair (including a wheelchair)?: A Little Help needed standing up from a chair using your arms (e.g., wheelchair or bedside chair)?: A Little Help needed to walk in hospital room?: A Little Help needed climbing 3-5 steps with a railing? : A Little 6 Click Score: 19    End of Session Equipment Utilized During Treatment: Gait belt Activity Tolerance: Patient tolerated treatment well Patient left: in bed;with call bell/phone within reach(with fall matt and refused alarm) Nurse Communication: Mobility status;Precautions PT Visit Diagnosis: Other abnormalities of gait and mobility (R26.89);Muscle weakness (generalized) (M62.81);Unsteadiness on feet (R26.81);Difficulty in walking, not elsewhere  classified (R26.2)     Time: 5809-9833 PT Time Calculation (min) (ACUTE ONLY): 50 min  Charges:  $Gait Training: 8-22 mins $Therapeutic Exercise: 8-22 mins $Therapeutic Activity: 8-22 mins                     Kayleeann Huxford Pam Drown, PT Acute Rehabilitation Services Pager: [phone] Office: [place] 01/18/2019, 1:43 PM

## 2019-01-18 NOTE — Progress Notes (Addendum)
Subjective:  In room working with PT, No cos   Objective Vital signs in last 24 hours: Vitals:   01/18/19 0520 01/18/19 0755 01/18/19 0901 01/18/19 1135  BP: (!) 119/105  117/65 100/63  Pulse: (!) 113 74 85 73  Resp: 18 18 20 16   Temp: 97.9 F (36.6 C)     TempSrc: Oral     SpO2: 95% 93% 96% 95%  Weight: 89.4 kg     Height:       Weight change: -0.5 kg  Physical Exam: General: Chronically ill appearing male, alert and in NAD Heart: RRR, no murmurs, rubs or gallops Lungs: + rhonchi bilateral lower lobes. No rales auscultated. Respirations unlabored on O2 via nasal cannula.  Abdomen:  BS +, Soft, non-tender, non-distended.  Extremities: 1+ pitting edema b/l lower extremities below knees . Prior TMA L leg, erythema of legs bilaterally Dialysis Access: TDC L chest./ LUE AVF + bruit with surrounding bruising with slight improvement   TTS [place] 4h400/800 92.5kg 3K/2.25bath P2 LIJTDC Heparin none -Calcitriol 0.5 mcg PO TIW  Problem/Plan: 1.CAD with unstable angina:S/p cardiac cath, on medical therapy. Management per primary. 2. Nausea/dysphagia:DG esophagus showed obstruction of distal esophagus. S/p EGD4/29with removal of obstruction. Management per GI/Primary.  3. RUL aspiration PNA:in hospital Dx, on IV Unasyn , per primary team. 2. ESRD:TTS schedule. Next HD Tuesday. Pt started HD in [place] 12/2017, moved here Jan 2020 and set up at [facility] HD.  S/p L BC AVF placement on 4/27 (L RC AVF Jan '20 didn't work). To follow up with [facility] in 1 month. 3.HTN/volume:Under dry wt here. BP's soft.Lower dry wt dc. Some LE edema, limited UF due to low BP's. 4. Anemia PVX:YIA16.5 No ESA indicated at this time. 5. Secondary hyperparathyroidism:Calcium 8.7, corrected 9.1. Continue calcitriol. Phos 4.8, not currently on a binder. 6. Nutrition:Albumin 3.3. Poor appetite secondary to nausea/dysphagia at this time.  7. PAD s/p aortic stent, leg  swelling:Reportedly no role for revascularization. 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  8. A.fib:Not on anticoagulation due to thrombocytopenia. Rate currently controlled. Per primary/cardiology.  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  10. COPD:Stable dyspneaGeneral:   Ernest Haber, PA-C [facility] Beeper [phone] 01/18/2019,2:29 PM  LOS: 25 days   Pt seen, examined and agree w A/P as above. Per primary dc plan may be changing now to SNF placement.  [facility] 01/18/2019, 3:05 PM    Labs: Basic Metabolic Panel: Recent Labs  Lab 01/13/19 0422 01/15/19 0359 01/16/19 0250  NA 137 138 137  K 4.6 5.1 4.9  CL 98 100 98  CO2 22 20* 20*  GLUCOSE 99 121* 121*  BUN 48* 42* 61*  CREATININE 5.94* 5.47* 6.94*  CALCIUM 8.7* 8.5* 8.9   Liver Function Tests: Recent Labs  Lab 01/15/19 0359  AST 23  ALT 10  ALKPHOS 93  BILITOT 1.2  PROT 5.6*  ALBUMIN 2.8*   No results for input(s): LIPASE, AMYLASE in the last 168 hours. No results for input(s): AMMONIA in the last 168 hours. CBC: Recent Labs  Lab 01/13/19 0422 01/15/19 0359 01/16/19 0250 01/17/19 0814 01/18/19 0543  WBC 22.7* 15.6* 15.7* 12.8* 11.7*  NEUTROABS  --  14.6*  --   --   --   HGB 12.8* 11.1* 11.1* 10.8* 11.0*  HCT 39.8 34.9* 34.9* 33.9* 33.9*  MCV 88.4 89.5 88.1 87.8  88.3  PLT 103* 65* 93* 73* 86*   Cardiac Enzymes: No results for input(s): CKTOTAL, CKMB, CKMBINDEX, TROPONINI in the last 168 hours. CBG: Recent Labs  Lab 01/12/19 1122  GLUCAP 129*    Studies/Results: No results found. Medications: . sodium chloride Stopped (01/17/19 1759)  . sodium chloride    . sodium chloride    . ampicillin-sulbactam (UNASYN) IV Stopped (01/17/19 1715)   . amiodarone  200 mg Oral Daily  . aspirin  81 mg Oral Daily  .  atorvastatin  80 mg Oral q1800  . calcitRIOL  0.5 mcg Oral Q T,Th,Sa-HD  . Chlorhexidine Gluconate Cloth  6 each Topical Q0600  . gabapentin  300 mg Oral QHS  . guaiFENesin  600 mg Oral BID  . metoprolol succinate  25 mg Oral Daily  . multivitamin  1 tablet Oral Daily  . pantoprazole  40 mg Oral BID AC  . predniSONE  20 mg Oral Q breakfast  . sodium chloride flush  3 mL Intravenous Q12H  . umeclidinium-vilanterol  1 puff Inhalation Daily

## 2019-01-18 NOTE — Progress Notes (Signed)
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 fib with RVR, chronic thrombocytopenia, anemia, COPD, HTN, admitted on 12/23/2018 with complaints of chest pain and dyspnea on exertion, initially presented to [facility] ED where CTA chest was negative for PE, transferred to [facility] for cardiology evaluation. Cardiology evaluated for unstable angina, was on heparin drip, felt not a candidate for surgical or percutaneous revascularization (prior CABG, thrombocytopenia), added Imdur and signed off. COVID testing negative. Nephrology consulting for HD needs.Vascular surgery consulted for right foot pain.Hematology consulted for thrombocytopenia. Patient was placed on a steroid trial with improvement on the platelet count.  Patient underwent cardiac cath and now on medical management, also underwent lower extremity aortogram, and no role of revascularization.    HPI/Recap of past 24 hours:  plt 86 today,bp stable,    No fever, less productive cough , less  epigastric pain, reports tolerating soft diet Reports feeling weak, fell this am, he now agreeing to go to snf   Assessment/Plan: Principal Problem:   Chest pain Active Problems:   Dyspnea   ESRD (end stage renal disease) on dialysis [facility])   Atrial fibrillation with RVR (HCC)   Acute on chronic systolic heart failure (HCC)   Cardiomyopathy (HCC)   PVD (peripheral vascular disease) ([place])   Dysphagia   Esophageal obstruction due to food impaction   Aspiration pneumonia of left upper lobe due to gastric secretions ([place])    Dysphagia/odynophagia -urgent EGD on 4/29 evening with removal of a large fibrous food bolus from the distal esophagus -GI input  appreciated.  -Advance diet slowly, he need to repeat egd in a few weeks per gi   Aspiration pneumonia: -4/30 his blood pressure is soft, wbc elevated, no fever, -cxr + RUL pneumonia, started on unasyn on 4/30, continue unasyn for now, likely will transition to augmentin once able to swallow better, plan for total of 7 days abx treatment  - imdur discontinued on 4/30, continue to hold, may be able to restart if bp goes up -continue  mucinex and nebs   CAD with unstable angina:  -Status post cardiac cath, plan is to continue medical therapy:asa,lipitor, imdur (hold of 4/30 due to soft blood pressure), decrease toprol with holding parameter. - cardiology signed off.  Acute on chronic systolic heart failure,  -LVEF 20 to 25% ECHO 3/24: Continue volume management with dialysis,, cont toprol. Appreciate cardiology input, cards signed off, needs follow-up echo in 3 months.  previous cardiology in [facility] discussed getting a device in 2019 not sure about long-term prognosis currently.    Permanent atrial fibrillation: Rate controlled chads 2 vas score of 3, continue amiodarone, beta-blocker.  Previously no anticoagulation due to low platelet.  needs to follow-up with outpatient.   Mild transaminitis in the setting of CHF.  Overall lfts stable.acute viral hepatitis panel was negative.  PAD s/p aortic stent graft, with right leg pain, leg swelling- NO DVT. S/p aortogram and bilateral lower extremity runoff noted to have occluded anterior tibial arteries b/l.S/P right lower extremity arteriogram - in line flow via PT and as per vascular surgery, no role for revascularization. Cont asa, statins,  ESRD on HD TTS w mild hyperkalemia: .  Appreciate nephrology input. 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  Appreciate hematology input and currently on steroid trial since 4/14 and platelet improving. Tapering steroid to 20 mg daily, recommendation for  slow taper as outpatient, with outpatient follow-up labs at [place] center.   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 Cont pain control.  COPD stable no wheezing. COVID 19 is negative   DVT prophylaxis: No SCD due to leg pain.  Not on pharmacologic anticoagulation due to thrombocytopenia. Code Status: Full code   Family Communication: patient , he declined my offer to call his son  Disposition Plan: SNF when bed is available   Consultants:  Vascular surgery  Hematology  Nephrology  cardiology  Procedures: dialysis 4/27 LUE AVF  RIGHT/LEFT HEART CATH AND CORONARY/GRAFT ANGIOGRAPH  Prox RCA to Mid RCA lesion is 100% stenosed.  Ost LM to Mid LM lesion is 50% stenosed.  Ost Cx lesion is 95% stenosed.  Prox LAD lesion is 100% stenosed.  Ost 1st Diag to 1st Diag lesion is 70% stenosed.  Origin to Insertion lesion before 1st Diag is 100% stenoseD  Antibiotics:  perioperative   Objective: BP 100/63 (BP Location: Right Arm)   Pulse 73   Temp 97.9 F (36.6 C) (Oral)   Resp 16   Ht 5\' 11"  (1.803 m)   Wt 89.4 kg Comment: scale a  SpO2 95%   BMI 27.49 kg/m   Intake/Output Summary (Last 24 hours) at 01/18/2019 1437 Last data filed at 01/18/2019 5188 Gross per 24 hour  Intake 768.48 ml  Output 0 ml  Net 768.48 ml   Filed Weights   01/16/19 1150 01/17/19 0449 01/18/19 0520  Weight: 88.4 kg 88.1 kg 89.4 kg    Exam: Patient is examined daily including today on 01/18/2019, exams remain the same as of yesterday except that has changed    General:  Better, NAD  Cardiovascular: RRR  Respiratory: good aeration ,no wheezing , occasional rhonchi right upper lobe, improves after productive cough  Abdomen: Soft/ND/NT, positive BS  Musculoskeletal: trace Edema right lower extremity, left leg + soft tissue mass,  left foot h/o TMA   Many years ago  Neuro: alert, oriented   Data Reviewed: Basic Metabolic Panel: Recent Labs  Lab 01/11/19 1517 01/12/19 0330 01/13/19 0422 01/15/19 0359 01/16/19 0250  NA 134* 135 137 138 137  K 5.3* 5.6* 4.6 5.1 4.9  CL  --  101 98 100 98  CO2  --  18* 22 20* 20*  GLUCOSE 109* 130* 99 121* 121*  BUN  --  91* 48* 42* 61*  CREATININE  --  8.67* 5.94* 5.47* 6.94*  CALCIUM  --  8.3* 8.7* 8.5* 8.9   Liver Function Tests: Recent Labs  Lab 01/15/19 0359  AST 23  ALT 10  ALKPHOS 93  BILITOT 1.2  PROT 5.6*  ALBUMIN 2.8*   No results for input(s): LIPASE, AMYLASE in the last 168 hours. No results for input(s): AMMONIA in the last 168 hours. CBC: Recent Labs  Lab 01/13/19 0422 01/15/19 0359 01/16/19 0250 01/17/19 0814 01/18/19 0543  WBC 22.7* 15.6* 15.7* 12.8* 11.7*  NEUTROABS  --  14.6*  --   --   --   HGB 12.8* 11.1* 11.1* 10.8* 11.0*  HCT 39.8 34.9* 34.9* 33.9* 33.9*  MCV 88.4 89.5 88.1 87.8 88.3  PLT 103* 65* 93* 73* 86*   Cardiac Enzymes:   No results  for input(s): CKTOTAL, CKMB, CKMBINDEX, TROPONINI in the last 168 hours. BNP (last 3 results) Recent Labs    12/07/18 2237  BNP 1,637.3*    ProBNP (last 3 results) No results for input(s): PROBNP in the last 8760 hours.  CBG: Recent Labs  Lab 01/12/19 1122  GLUCAP 129*    Recent Results (from the past 240 hour(s))  Surgical pcr screen     Status: None   Collection Time: 01/10/19  2:15 PM  Result Value Ref Range Status   MRSA, PCR NEGATIVE NEGATIVE Final   Staphylococcus aureus NEGATIVE NEGATIVE Final    Comment: (NOTE) The Xpert SA Assay (FDA approved for NASAL specimens in patients 44 years of age and older), is one component of a comprehensive surveillance program. It is not intended to diagnose infection nor to guide or monitor treatment. Performed at [facility], [place] [address], [place], [place] [address]   Expectorated sputum assessment w rflx to resp cult     Status: None    Collection Time: 01/17/19  9:22 AM  Result Value Ref Range Status   Specimen Description EXPECTORATED SPUTUM  Final   Special Requests NONE  Final   Sputum evaluation   Final    THIS SPECIMEN IS ACCEPTABLE FOR SPUTUM CULTURE Performed at [facility], [place] [address], [place], [place] [address]    Report Status 01/17/2019 FINAL  Final  Culture, respiratory     Status: None (Preliminary result)   Collection Time: 01/17/19  9:22 AM  Result Value Ref Range Status   Specimen Description EXPECTORATED SPUTUM  Final   Special Requests NONE Reflexed from [id number]  Final   Gram Stain   Final    ABUNDANT WBC PRESENT, PREDOMINANTLY PMN GRAM NEGATIVE RODS    Culture   Final    MODERATE GRAM NEGATIVE RODS IDENTIFICATION AND SUSCEPTIBILITIES TO FOLLOW Performed at [facility], [place] [address] [place], [place] [address]    Report Status PENDING  Incomplete     Studies: No results found.  Scheduled Meds: . amiodarone  200 mg Oral Daily  . aspirin  81 mg Oral Daily  . atorvastatin  80 mg Oral q1800  . calcitRIOL  0.5 mcg Oral Q T,Th,Sa-HD  . Chlorhexidine Gluconate Cloth  6 each Topical Q0600  . gabapentin  300 mg Oral QHS  . guaiFENesin  600 mg Oral BID  . metoprolol succinate  25 mg Oral Daily  . multivitamin  1 tablet Oral Daily  . pantoprazole  40 mg Oral BID AC  . predniSONE  20 mg Oral Q breakfast  . sodium chloride flush  3 mL Intravenous Q12H  . umeclidinium-vilanterol  1 puff Inhalation Daily    Continuous Infusions: . sodium chloride Stopped (01/17/19 1759)  . sodium chloride    . sodium chloride    . ampicillin-sulbactam (UNASYN) IV Stopped (01/17/19 1715)     Time spent: 36mins I have personally reviewed and interpreted on  01/18/2019 daily labs, imagings as discussed above under date review session and assessment and plans.  I reviewed all nursing notes, pharmacy notes, consultant notes,  vitals, pertinent old records  I have discussed plan of care as  described above with RN , patient on 01/18/2019   Florencia Reasons MD, PhD  [facility] Pager [phone]. If 7PM-7AM, please contact night-coverage at [email or web address], password [facility] 01/18/2019, 2:37 PM  LOS: 25 days

## 2019-01-18 NOTE — Progress Notes (Signed)
Patient found on the floor by nurse tech this morning. Pt has been refusing bed alarm, low bed, and floor mats since his first fall during this admission. Yellow armband and yellow socks have been in place throughout this shift. VS stable. Pt neuro intact. Pt with abrasions to right elbow and right knee, but pt denies hitting his head. MD paged. Will continue to monitor.

## 2019-01-18 NOTE — Progress Notes (Addendum)
Occupational Therapy Treatment Patient Details Name: Larry Klein MRN: [id number] DOB: 1956-04-24 Today's Date: 01/18/2019    History of present illness Pt is a 63 y.o. 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 fib with RVR, COPD who was admitted on 12/23/2018 with complaints of chest pain and dyspnea. Cardiology felt not a candidate for surgical revascularization. 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 Pt with fall on 4/28 and endoscopy 4/29 to remove esophageal mass, LUE fistula.    OT comments  Pt voicing frustration about falling and being significantly weaker and unsteady vs 2 days ago. Pt reliant on RW for ambulation. Concerned about being able to take himself to outpatient HD and reports there is no family member available to assist him. Educated at length in fall prevention. Pt non receptive. Updated d/c to include HHOT for home safety.   Follow Up Recommendations  SNF;Supervision/Assistance - 24 hour    Equipment Recommendations  3 in 1 bedside commode(Rolling walker)    Recommendations for Other Services      Precautions / Restrictions Precautions Precautions: Fall Precaution Comments: left transmet, pt with falls x 2 this hospitalization       Mobility Bed Mobility               General bed mobility comments: pt seated and left at EOB  Transfers Overall transfer level: Needs assistance Equipment used: Rolling walker (2 wheeled) Transfers: Sit to/from Stand Sit to Stand: Supervision         General transfer comment: cues for hand placement, practiced from bed and chair    Balance Overall balance assessment: Needs assistance   Sitting balance-Leahy Scale: Good       Standing balance-Leahy Scale: Fair Standing balance comment: able to stand without UE assist, reliant on UE support for gait                            ADL either performed or assessed with clinical judgement   ADL Overall ADL's : Needs assistance/impaired     Grooming: Wash/dry hands;Standing;Min guard           Upper Body Dressing : Set up;Sitting       Toilet Transfer: Min guard;Ambulation;Regular Toilet;Grab bars   Toileting- Clothing Manipulation and Hygiene: Min guard;Sit to/from stand       Functional mobility during ADLs: Min guard;Rolling walker General ADL Comments: Pt voicing frustration with how he typically is steady with ambulation and now with frequent falls.     Vision       Perception     Praxis      Cognition Arousal/Alertness: Awake/alert Behavior During Therapy: Flat affect Overall Cognitive Status: Impaired/Different from baseline Area of Impairment: Safety/judgement                         Safety/Judgement: Decreased awareness of deficits;Decreased awareness of safety     General Comments: pt with poor insight, ruminating about his falls, demonstrating poor problem solving as to how falls can be prevented         Exercises     Shoulder Instructions       General Comments      Pertinent Vitals/ Pain       Pain Assessment: Faces Faces Pain Scale: Hurts little more Pain Location: from my stomach to my  head Pain Descriptors / Indicators: Aching Pain Intervention(s): RN gave pain meds during session  Home Living                                          Prior Functioning/Environment              Frequency  Min 2X/week        Progress Toward Goals  OT Goals(current goals can now be found in the care plan section)  Progress towards OT goals: Progressing toward goals  Acute Rehab OT Goals Patient Stated Goal: get better; feel better before going home OT Goal Formulation: With patient/family Time For Goal Achievement: 01/31/19 Potential to Achieve Goals: Good  Plan Discharge plan remains appropriate    Co-evaluation                  AM-PAC OT "6 Clicks" Daily Activity     Outcome Measure   Help from another person eating meals?: None Help from another person taking care of personal grooming?: A Little Help from another person toileting, which includes using toliet, bedpan, or urinal?: A Little Help from another person bathing (including washing, rinsing, drying)?: A Little Help from another person to put on and taking off regular upper body clothing?: None Help from another person to put on and taking off regular lower body clothing?: A Little 6 Click Score: 20    End of Session Equipment Utilized During Treatment: Rolling walker;Gait belt  OT Visit Diagnosis: Unsteadiness on feet (R26.81);Other symptoms and signs involving cognitive function;History of falling (Z91.81)   Activity Tolerance Patient tolerated treatment well   Patient Left in bed;with call bell/phone within reach(pt refusing bed alarm, but verbalized he to call for help)   Nurse Communication Mobility status        Time: 6387-5643 OT Time Calculation (min): 42 min  Charges: OT General Charges $OT Visit: 1 Visit OT Treatments $Self Care/Home Management : 38-52 mins  Nestor Lewandowsky, OTR/L Acute Rehabilitation Services Pager: [phone] Office: [phone]   Malka So 01/18/2019, 9:39 AM

## 2019-01-18 NOTE — Clinical Social Work Note (Signed)
Clinical Social Work Assessment / Case Management  Patient Details  Name: Larry Klein MRN: [id number] Date of Birth: 1956-09-15  Date of referral:  01/18/19               Reason for consult:  Other (Comment Required), Facility Placement                Permission sought to share information with:  Case Manager Permission granted to share information::  Yes, Verbal Permission Granted  Name::     Larry Klein  Agency::  SNF  Relationship::   son  Contact Information:   [phone]  Housing/Transportation Living arrangements for the past 2 months:  [place] of Information:  Patient Patient Interpreter Needed:  None Criminal Activity/Legal Involvement Pertinent to Current Situation/Hospitalization:  No - Comment as needed Significant Relationships:  None Lives with:  Significant Other Do you feel safe going back to the place where you live?  Yes Need for family participation in patient care:  Yes Care giving concerns: PT recommends SNF placement at discharge.   Social Worker assessment / plan:    CM talked to patient at the bedside, introduced role as the Case Manager, all questions answered; Physical Therapy is recommending SNF placement, he is agreeable to SNF placement at this time. No facility preference. CM provided a list with SNF within a 25 mile radius. CM will continue to follow for for support and facility discharge to SNF when medically stable  Employment status:  Unemployed Insurance information:  Medicare, Medicaid In [place] PT Recommendations:  Inpatient Rehab Consult Information / Referral to community resources:  [place]  Patient/Family's Response to care:  Patient is agreeable to SNF placement  Patient/Family's Understanding of and Emotional Response to Diagnosis, Current Treatment, and Prognosis:  Patient has a good understanding and reason for SNF placement - need rehab prior to returning home. Patient is happy with the  hospital care, no complaints.  Emotional Assessment Appearance:  Appears older than stated age Attitude/Demeanor/Rapport:  Gracious Affect (typically observed):  Accepting Orientation:  Oriented to Self, Oriented to  Time, Oriented to Place, Oriented to Situation Alcohol / Substance use:  Never Used Psych involvement (Current and /or in the community):  No (Comment)  Discharge Needs  Concerns to be addressed:  No discharge needs identified Readmission within the last 30 days:  No Current discharge risk:  None Barriers to Discharge:  No Barriers Identified   Royston Bake, RN,MHA,BSN  01/18/2019, 4:02 PM

## 2019-01-18 NOTE — Telephone Encounter (Signed)
The pt has been scheduled for ROV and has been advised appt scheduled for 5/21 at 730 am She will get instructions at office visit.

## 2019-01-18 NOTE — NC FL2 (Signed)
[place] LEVEL OF CARE SCREENING TOOL     IDENTIFICATION  Patient Name: Larry Klein Birthdate: December 11, 1955 Sex: male Admission Date (Current Location): 12/23/2018  [place] and [place] Number:  Oval Linsey [id number] [place] and Address:  [facility], [place] [address] [place], [place] [address]      Provider Number: [id number]  Attending Physician Name and Address:  Florencia Reasons, MD  Relative Name and Phone Number:  Damon Hargrove [phone]    Current Level of Care: Hospital Recommended Level of Care: [place] Prior Approval Number:    Date Approved/Denied:   PASRR Number: [id number]  Discharge Plan: SNF    Current Diagnoses: Patient Active Problem List   Diagnosis Date Noted  . Aspiration pneumonia of left upper lobe due to gastric secretions ([place])   . Dysphagia   . Esophageal obstruction due to food impaction   . PVD (peripheral vascular disease) ([place])   . Severe left ventricular systolic dysfunction   . Cardiomyopathy ([place])   . COPD with acute exacerbation ([place]) 12/11/2018  . Acute on chronic systolic heart failure ([place]) 12/11/2018  . Chest pain 12/08/2018  . Atrial fibrillation with RVR ([place]) 12/07/2018  . Atrial fibrillation ([place]) 11/12/2018  . Thrombocytopenia ([place]) 09/20/2018  . ESRD (end stage renal disease) on dialysis ([place])   . Hepatomegaly   . Dyspnea 09/17/2018    Orientation RESPIRATION BLADDER Height & Weight     Self, Time, Situation, Place  Normal Continent Weight: 89.4 kg(scale a) Height:  5\' 11"  (180.3 cm)  BEHAVIORAL SYMPTOMS/MOOD NEUROLOGICAL BOWEL NUTRITION STATUS  (no problem)   Continent (soft diet)  AMBULATORY STATUS COMMUNICATION OF NEEDS Skin   Limited Assist Verbally Normal                       Personal Care Assistance Level of Assistance  Bathing(min asst bathing)           Functional Limitations Info             SPECIAL CARE FACTORS FREQUENCY  PT (By  licensed PT), OT (By licensed OT)     PT Frequency: 5 x week OT Frequency: 5 x week            Contractures Contractures Info: Not present    Additional Factors Info  Code Status Code Status Info: Full Code             Current Medications (01/18/2019):  This is the current hospital active medication list Current Facility-Administered Medications  Medication Dose Route Frequency Provider Last Rate Last Dose  . 0.9 %  sodium chloride infusion  250 mL Intravenous PRN Milus Banister, MD   Stopped at 01/17/19 1759  . 0.9 %  sodium chloride infusion  250 mL Intravenous PRN Milus Banister, MD      . 0.9 %  sodium chloride infusion  250 mL Intravenous PRN Milus Banister, MD      . acetaminophen (TYLENOL) tablet 650 mg  650 mg Oral Q4H PRN Milus Banister, MD   650 mg at 01/10/19 6226  . albuterol (PROVENTIL) (2.5 MG/3ML) 0.083% nebulizer solution 3 mL  3 mL Inhalation Q4H PRN Milus Banister, MD   3 mL at 01/13/19 0830  . amiodarone (PACERONE) tablet 200 mg  200 mg Oral Daily Milus Banister, MD   200 mg at 01/18/19 0850  . Ampicillin-Sulbactam (UNASYN) 3 g in sodium chloride 0.9 % 100 mL  IVPB  3 g Intravenous Q24H Florencia Reasons, MD   Stopped at 01/17/19 1715  . aspirin chewable tablet 81 mg  81 mg Oral Daily Milus Banister, MD   81 mg at 01/18/19 9326  . atorvastatin (LIPITOR) tablet 80 mg  80 mg Oral q1800 Milus Banister, MD   80 mg at 01/17/19 1638  . bismuth subsalicylate (PEPTO BISMOL) 262 MG/15ML suspension 30 mL  30 mL Oral Q6H PRN Milus Banister, MD   30 mL at 01/12/19 1850  . calcitRIOL (ROCALTROL) capsule 0.5 mcg  0.5 mcg Oral Q T,Th,Sa-HD Milus Banister, MD   0.5 mcg at 01/16/19 1258  . Chlorhexidine Gluconate Cloth 2 % PADS 6 each  6 each Topical Q0600 Milus Banister, MD   6 each at 01/18/19 [phone]  . diazepam (VALIUM) tablet 10 mg  10 mg Oral QHS PRN Milus Banister, MD   10 mg at 01/17/19 2254  . gabapentin (NEURONTIN) tablet 300 mg  300 mg Oral QHS Milus Banister, MD   300 mg at 01/17/19 2254  . guaiFENesin (MUCINEX) 12 hr tablet 600 mg  600 mg Oral BID Florencia Reasons, MD   600 mg at 01/18/19 5809  . hydrALAZINE (APRESOLINE) injection 5 mg  5 mg Intravenous Q20 Min PRN Milus Banister, MD      . HYDROcodone-acetaminophen (NORCO/VICODIN) 5-325 MG per tablet 1-2 tablet  1-2 tablet Oral Q4H PRN Milus Banister, MD   1 tablet at 01/18/19 0913  . ipratropium (ATROVENT) nebulizer solution 0.5 mg  0.5 mg Nebulization BID PRN Florencia Reasons, MD      . labetalol (NORMODYNE) injection 10 mg  10 mg Intravenous Q10 min PRN Milus Banister, MD      . levalbuterol Penne Lash) nebulizer solution 1.25 mg  1.25 mg Nebulization BID PRN Florencia Reasons, MD      . metoprolol succinate (TOPROL-XL) 24 hr tablet 25 mg  25 mg Oral Daily Florencia Reasons, MD   25 mg at 01/18/19 9833  . morphine 2 MG/ML injection 1 mg  1 mg Intravenous Q1H PRN Milus Banister, MD   1 mg at 01/13/19 1853  . multivitamin (RENA-VIT) tablet 1 tablet  1 tablet Oral Daily Milus Banister, MD   1 tablet at 01/18/19 [phone]  . ondansetron (ZOFRAN) injection 4 mg  4 mg Intravenous Q6H PRN Milus Banister, MD   4 mg at 01/13/19 0913  . pantoprazole (PROTONIX) EC tablet 40 mg  40 mg Oral BID AC Milus Banister, MD   40 mg at 01/18/19 5397  . predniSONE (DELTASONE) tablet 20 mg  20 mg Oral Q breakfast Milus Banister, MD   20 mg at 01/18/19 0851  . sodium chloride flush (NS) 0.9 % injection 3 mL  3 mL Intravenous PRN Milus Banister, MD   3 mL at 01/18/19 0908  . sodium chloride flush (NS) 0.9 % injection 3 mL  3 mL Intravenous Q12H Milus Banister, MD   3 mL at 01/18/19 1310  . umeclidinium-vilanterol (ANORO ELLIPTA) 62.5-25 MCG/INH 1 puff  1 puff Inhalation Daily Milus Banister, MD   1 puff at 01/18/19 6734     Discharge Medications: Please see discharge summary for a list of discharge medications.  Relevant Imaging Results:  Relevant Lab Results:   Additional Information    Royston Bake, RN

## 2019-01-18 NOTE — TOC Transition Note (Signed)
Transition of Care [facility]) - CM/SW Discharge Note   Patient Details  Name: Larry Klein MRN: [id number] Date of Birth: 1956/08/16  [facility]) CM/SW Contact:  Sherrilyn Rist Phone Number: [phone] 01/18/2019, 12:10 PM   Clinical Narrative:    01/18/2019 - CM talked to patient about [place] services, patient requested Outpatient services. CM informed patient due to the Covid 19 virus, the Outpatient Therapies are closed; Patient stated that he has too many guns at his home to have [place] and had to get his home in order before he will allow anyone in his home. CM informed him that when he gets his home in order to call his primary care physician and he can make [facility] arrangements. Mindi Slicker RN,MHA,BSN  01/11/2019- CM following for progression of care; for AVG placement today; ESRD - TTS at [facility]; has medical insurance with Medicare / Medicaid with prescription drug coverage; pharmacy of choice is [facility]; Aneta Mins  12/09/2018 - Clinical Narrative:Pt presented for Atrial Fib RVR- Initiated on IV Heparin gtt. CM will monitor for oral anticoagulation needs. Pt resides in [place]. Hx ESRD on HD-(TTS schedule)states he has transportation. Patient uses [facility]. Patient states he gets medications without any problems. Has secondary Medicaid. CM did discuss PCP needs and patient states he will set up via insurance.CM will continue to monitor for any transition of care needs. Arville Lime RN CM   Final next level of care: Home/Self Care Barriers to Discharge: No Barriers Identified   Patient Goals and CMS Choice Patient states their goals for this hospitalization and ongoing recovery are:: to get better CMS Medicare.gov Compare Post Acute Care list provided to:: Patient Choice offered to / list presented to : NA  Discharge Placement                       Discharge Plan and Services In-house Referral:  NA Discharge Planning Services: CM Consult Post Acute Care Choice: NA          DME Arranged: N/A DME Agency: NA       HH Arranged: NA HH Agency: NA        Social Determinants of Health (SDOH) Interventions     Readmission Risk Interventions No flowsheet data found.

## 2019-01-18 NOTE — Telephone Encounter (Signed)
-----   Message from Milus Banister, MD sent at 01/16/2019  7:44 AM EDT ----- Larry Klein, He needs ROV with me next week sometime (telemedicine). Also put him on tentatively for EGD at [facility] Thursday May 21st for dilation.  Thanks    ----- Message ----- From: Willia Craze, NP Sent: 01/14/2019   1:39 PM EDT To: Milus Banister, MD, Timothy Lasso, RN  Hi,  Larry Klein, this patient needs EGD in ~ 3 weeks for follow up food disimpaction but may need phone visit prior as he has multiple medical problems. EGD will need to be on M/W/F due to dialysis and I expect Dr. Ardis Hughs will want it done at [facility] Patient will probably remain hospitalized for next few days for treatment of PNA. Thanks. Hope you are well and staying safe.  PG

## 2019-01-19 DIAGNOSIS — Z9181 History of falling: Secondary | ICD-10-CM

## 2019-01-19 DIAGNOSIS — R296 Repeated falls: Secondary | ICD-10-CM

## 2019-01-19 LAB — RENAL FUNCTION PANEL
Albumin: 2.7 g/dL — ABNORMAL LOW (ref 3.5–5.0)
Anion gap: 20 — ABNORMAL HIGH (ref 5–15)
BUN: 73 mg/dL — ABNORMAL HIGH (ref 8–23)
CO2: 22 mmol/L (ref 22–32)
Calcium: 8.5 mg/dL — ABNORMAL LOW (ref 8.9–10.3)
Chloride: 96 mmol/L — ABNORMAL LOW (ref 98–111)
Creatinine, Ser: 8.46 mg/dL — ABNORMAL HIGH (ref 0.61–1.24)
GFR calc Af Amer: 7 mL/min — ABNORMAL LOW (ref >60)
GFR calc non Af Amer: 6 mL/min — ABNORMAL LOW (ref >60)
Glucose, Bld: 135 mg/dL — ABNORMAL HIGH (ref 70–99)
Phosphorus: 6.5 mg/dL — ABNORMAL HIGH (ref 2.5–4.6)
Potassium: 4.6 mmol/L (ref 3.5–5.1)
Sodium: 138 mmol/L (ref 135–145)

## 2019-01-19 LAB — CBC
HCT: 33.1 % — ABNORMAL LOW (ref 39.0–52.0)
Hemoglobin: 10.5 g/dL — ABNORMAL LOW (ref 13.0–17.0)
MCH: 28 pg (ref 26.0–34.0)
MCHC: 31.7 g/dL (ref 30.0–36.0)
MCV: 88.3 fL (ref 80.0–100.0)
Platelets: 75 10*3/uL — ABNORMAL LOW (ref 150–400)
RBC: 3.75 MIL/uL — ABNORMAL LOW (ref 4.22–5.81)
RDW: 19.2 % — ABNORMAL HIGH (ref 11.5–15.5)
WBC: 12.8 10*3/uL — ABNORMAL HIGH (ref 4.0–10.5)
nRBC: 0 % (ref 0.0–0.2)

## 2019-01-19 LAB — NOVEL CORONAVIRUS, NAA (HOSP ORDER, SEND-OUT TO REF LAB; TAT 18-24 HRS): SARS-CoV-2, NAA: NOT DETECTED

## 2019-01-19 MED ORDER — HEPARIN SODIUM (PORCINE) 1000 UNIT/ML IJ SOLN
INTRAMUSCULAR | Status: AC
  Administered 2019-01-19: 1000 [IU]
  Filled 2019-01-19: qty 4

## 2019-01-19 MED ORDER — PRO-STAT SUGAR FREE PO LIQD
30.0000 mL | Freq: Two times a day (BID) | ORAL | Status: DC
  Administered 2019-01-19 – 2019-01-20 (×4): 30 mL via ORAL
  Filled 2019-01-19 (×5): qty 30

## 2019-01-19 MED ORDER — CALCITRIOL 0.5 MCG PO CAPS
ORAL_CAPSULE | ORAL | Status: AC
  Administered 2019-01-19: 0.5 ug
  Filled 2019-01-19: qty 1

## 2019-01-19 MED ORDER — CALCIUM ACETATE (PHOS BINDER) 667 MG PO CAPS
667.0000 mg | ORAL_CAPSULE | Freq: Three times a day (TID) | ORAL | Status: DC
  Administered 2019-01-19 – 2019-01-21 (×5): 667 mg via ORAL
  Filled 2019-01-19 (×5): qty 1

## 2019-01-19 MED ORDER — HYDROCODONE-ACETAMINOPHEN 5-325 MG PO TABS
ORAL_TABLET | ORAL | Status: AC
  Administered 2019-01-19: 1
  Filled 2019-01-19: qty 2

## 2019-01-19 MED ORDER — AMOXICILLIN-POT CLAVULANATE 500-125 MG PO TABS
1.0000 | ORAL_TABLET | Freq: Two times a day (BID) | ORAL | Status: DC
  Administered 2019-01-19 – 2019-01-20 (×2): 500 mg via ORAL
  Filled 2019-01-19 (×3): qty 1

## 2019-01-19 NOTE — Plan of Care (Signed)
  Problem: Education: Goal: Knowledge of General Education information will improve Description: Including pain rating scale, medication(s)/side effects and non-pharmacologic comfort measures Outcome: Progressing   Problem: Health Behavior/Discharge Planning: Goal: Ability to manage health-related needs will improve Outcome: Progressing   Problem: Clinical Measurements: Goal: Ability to maintain clinical measurements within normal limits will improve Outcome: Progressing Goal: Will remain free from infection Outcome: Progressing Goal: Diagnostic test results will improve Outcome: Progressing Goal: Respiratory complications will improve Outcome: Progressing Goal: Cardiovascular complication will be avoided Outcome: Progressing   Problem: Activity: Goal: Risk for activity intolerance will decrease Outcome: Progressing   Problem: Nutrition: Goal: Adequate nutrition will be maintained Outcome: Progressing   Problem: Elimination: Goal: Will not experience complications related to bowel motility Outcome: Progressing Goal: Will not experience complications related to urinary retention Outcome: Progressing   Problem: Pain Managment: Goal: General experience of comfort will improve Outcome: Progressing   Problem: Safety: Goal: Ability to remain free from injury will improve Outcome: Progressing   Problem: Skin Integrity: Goal: Risk for impaired skin integrity will decrease Outcome: Progressing   Problem: Education: Goal: Ability to demonstrate management of disease process will improve Outcome: Progressing Goal: Ability to verbalize understanding of medication therapies will improve Outcome: Progressing Goal: Individualized Educational Video(s) Outcome: Progressing   Problem: Activity: Goal: Capacity to carry out activities will improve Outcome: Progressing   Problem: Cardiac: Goal: Ability to achieve and maintain adequate cardiopulmonary perfusion will  improve Outcome: Progressing   Problem: Education: Goal: Understanding of CV disease, CV risk reduction, and recovery process will improve Outcome: Progressing Goal: Individualized Educational Video(s) Outcome: Progressing   Problem: Activity: Goal: Ability to return to baseline activity level will improve Outcome: Progressing   Problem: Cardiovascular: Goal: Ability to achieve and maintain adequate cardiovascular perfusion will improve Outcome: Progressing Goal: Vascular access site(s) Level 0-1 will be maintained Outcome: Progressing   Problem: Health Behavior/Discharge Planning: Goal: Ability to safely manage health-related needs after discharge will improve Outcome: Progressing   

## 2019-01-19 NOTE — Progress Notes (Signed)
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 fib with RVR, chronic thrombocytopenia, anemia, COPD, HTN, admitted on 12/23/2018 with complaints of chest pain and dyspnea on exertion, initially presented to [facility] ED where CTA chest was negative for PE, transferred to [facility] for cardiology evaluation. Cardiology evaluated for unstable angina, was on heparin drip, felt not a candidate for surgical or percutaneous revascularization (prior CABG, thrombocytopenia), added Imdur and signed off. COVID testing negative. Nephrology consulting for HD needs.Vascular surgery consulted for right foot pain.Hematology consulted for thrombocytopenia. Patient was placed on a steroid trial with improvement on the platelet count.  Patient underwent cardiac cath and now on medical management, also underwent lower extremity aortogram, and no role of revascularization.    HPI/Recap of past 24 hours:  He reports feeling weak, at risk of fall. He is agreeing to go to SNF or CIR  No fever,  cough has resolved,he eat 100% his meal, denies epigastric pain, no n/v     Assessment/Plan: Principal Problem:   Chest pain Active Problems:   Dyspnea   ESRD (end stage renal disease) on dialysis [facility])   Atrial fibrillation with RVR (HCC)   Acute on chronic systolic heart failure (HCC)   Cardiomyopathy (HCC)   PVD (peripheral vascular disease) (HCC)   Dysphagia   Esophageal obstruction due to food impaction   Aspiration pneumonia of left upper lobe due to gastric secretions ([place])    Dysphagia/odynophagia -urgent EGD on 4/29 evening with removal of a large fibrous food bolus from the distal esophagus -GI input appreciated.   -Advance diet slowly, he need to repeat egd in a few weeks per gi   Aspiration pneumonia: -4/30 his blood pressure is soft, wbc elevated, no fever, -cxr + RUL pneumonia, started on unasyn on 4/30,  transition to augmentin , plan for total of 7 days abx treatment  - imdur discontinued on 4/30, continue to hold, may be able to restart if bp goes up -continue  mucinex and nebs -lung exam has much improved, rhonchi resolved, no wheezing, CTABL on 5/5   CAD with unstable angina:  -Status post cardiac cath, plan is to continue medical therapy:asa,lipitor, imdur (hold of 4/30 due to soft blood pressure), decrease toprol with holding parameter. - cardiology signed off.  Acute on chronic systolic heart failure,  -LVEF 20 to 25% ECHO 3/24: Continue volume management with dialysis,, cont toprol. Appreciate cardiology input, cards signed off, needs follow-up echo in 3 months.  previous cardiology in [facility] discussed getting a device in 2019 not sure about long-term prognosis currently.    Permanent atrial fibrillation: Rate controlled chads 2 vas score of 3, continue amiodarone, beta-blocker.  Previously no anticoagulation due to low platelet.  needs to follow-up with outpatient.   Mild transaminitis in the setting of CHF.  Overall lfts stable.acute viral hepatitis panel was negative.  PAD s/p aortic stent graft, with right leg pain, leg swelling- NO DVT. S/p aortogram and bilateral lower extremity runoff noted to have occluded anterior tibial arteries b/l.S/P right lower extremity arteriogram - in line flow via PT and as per vascular surgery, no role for revascularization. Cont asa, statins,  ESRD on HD TTS w mild hyperkalemia: .  Appreciate nephrology input. 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  Appreciate hematology input and currently on steroid trial since 4/14 and platelet improving. Tapering steroid to 20 mg daily, recommendation for  slow taper as outpatient, with outpatient follow-up labs at [place] [facility].   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 Cont pain control.  COPD stable no wheezing. COVID 19 is negative   DVT prophylaxis: No SCD due to leg pain.  Not on pharmacologic anticoagulation due to thrombocytopenia. Code Status: Full code   Family Communication: patient , he declined my offer to call his son  Disposition Plan: medically stable to discharge to SNF or CIR   Consultants:  Vascular surgery  Hematology  Nephrology  cardiology  Procedures: dialysis 4/27 LUE AVF  RIGHT/LEFT HEART CATH AND CORONARY/GRAFT ANGIOGRAPH  Prox RCA to Mid RCA lesion is 100% stenosed.  Ost LM to Mid LM lesion is 50% stenosed.  Ost Cx lesion is 95% stenosed.  Prox LAD lesion is 100% stenosed.  Ost 1st Diag to 1st Diag lesion is 70% stenosed.  Origin to Insertion lesion before 1st Diag is 100% stenoseD  Antibiotics:  perioperative   Objective: BP 102/65 (BP Location: Right Arm)   Pulse (!) 40   Temp 98.1 F (36.7 C) (Oral)   Resp 20   Ht 5\' 11"  (1.803 m)   Wt 89.2 kg   SpO2 96%   BMI 27.43 kg/m   Intake/Output Summary (Last 24 hours) at 01/19/2019 2244 Last data filed at 01/19/2019 2147 Gross per 24 hour  Intake 758.73 ml  Output 2700 ml  Net -1941.27 ml   Filed Weights   01/19/19 0202 01/19/19 0715 01/19/19 1120  Weight: 90.1 kg 92 kg 89.2 kg    Exam: Patient is examined daily including today on 01/19/2019, exams remain the same as of yesterday except that has changed    General:  Better, NAD  Cardiovascular: RRR  Respiratory: good aeration ,no wheezing ,   right upper lobe rhonchi has resolved,   Abdomen: Soft/ND/NT, positive BS  Musculoskeletal: trace Edema right lower extremity, left leg + soft tissue mass,  left foot h/o TMA  Many years ago   Neuro: alert, oriented   Data Reviewed: Basic Metabolic Panel: Recent Labs  Lab 01/13/19 0422 01/15/19 0359 01/16/19 0250 01/19/19 0729  NA 137 138 137 138  K 4.6 5.1 4.9 4.6  CL 98 100 98 96*  CO2 22 20* 20* 22  GLUCOSE 99 121* 121* 135*  BUN 48* 42* 61* 73*  CREATININE 5.94* 5.47* 6.94* 8.46*  CALCIUM 8.7* 8.5* 8.9 8.5*  PHOS  --   --   --  6.5*   Liver Function Tests: Recent Labs  Lab 01/15/19 0359 01/19/19 0729  AST 23  --   ALT 10  --   ALKPHOS 93  --   BILITOT 1.2  --   PROT 5.6*  --   ALBUMIN 2.8* 2.7*   No results for input(s): LIPASE, AMYLASE in the last 168 hours. No results for input(s): AMMONIA in the last 168 hours. CBC: Recent Labs  Lab 01/15/19 0359 01/16/19 0250 01/17/19 0814 01/18/19 0543 01/19/19 0417  WBC 15.6* 15.7* 12.8* 11.7* 12.8*  NEUTROABS 14.6*  --   --   --   --   HGB 11.1* 11.1* 10.8* 11.0* 10.5*  HCT 34.9* 34.9* 33.9* 33.9* 33.1*  MCV 89.5 88.1 87.8 88.3  88.3  PLT 65* 93* 73* 86* 75*   Cardiac Enzymes:   No results for input(s): CKTOTAL, CKMB, CKMBINDEX, TROPONINI in the last 168 hours. BNP (last 3 results) Recent Labs    12/07/18 2237  BNP 1,637.3*    ProBNP (last 3 results) No results for input(s): PROBNP in the last 8760 hours.  CBG: Recent Labs  Lab 01/18/19 1617  GLUCAP 151*    Recent Results (from the past 240 hour(s))  Surgical pcr screen     Status: None   Collection Time: 01/10/19  2:15 PM  Result Value Ref Range Status   MRSA, PCR NEGATIVE NEGATIVE Final   Staphylococcus aureus NEGATIVE NEGATIVE Final    Comment: (NOTE) The Xpert SA Assay (FDA approved for NASAL specimens in patients 82 years of age and older), is one component of a comprehensive surveillance program. It is not intended to diagnose infection nor to guide or monitor treatment. Performed at [facility], [place] [address], [place], [place] [address]   Expectorated sputum assessment w rflx to resp cult     Status: None    Collection Time: 01/17/19  9:22 AM  Result Value Ref Range Status   Specimen Description EXPECTORATED SPUTUM  Final   Special Requests NONE  Final   Sputum evaluation   Final    THIS SPECIMEN IS ACCEPTABLE FOR SPUTUM CULTURE Performed at [facility], [place] [address] [place], [place] [address]    Report Status 01/17/2019 FINAL  Final  Culture, respiratory     Status: None (Preliminary result)   Collection Time: 01/17/19  9:22 AM  Result Value Ref Range Status   Specimen Description EXPECTORATED SPUTUM  Final   Special Requests NONE Reflexed from [id number]  Final   Gram Stain   Final    ABUNDANT WBC PRESENT, PREDOMINANTLY PMN GRAM NEGATIVE RODS Performed at [facility], [place] [address], [place], [place] [address]    Culture MODERATE ESCHERICHIA COLI  Final   Report Status PENDING  Incomplete   Organism ID, Bacteria ESCHERICHIA COLI  Final      Susceptibility   Escherichia coli - MIC*    AMPICILLIN >=32 RESISTANT Resistant     CEFAZOLIN <=4 SENSITIVE Sensitive     CEFEPIME <=1 SENSITIVE Sensitive     CEFTAZIDIME <=1 SENSITIVE Sensitive     CEFTRIAXONE <=1 SENSITIVE Sensitive     CIPROFLOXACIN >=4 RESISTANT Resistant     GENTAMICIN <=1 SENSITIVE Sensitive     IMIPENEM <=0.25 SENSITIVE Sensitive     TRIMETH/SULFA >=320 RESISTANT Resistant     AMPICILLIN/SULBACTAM 16 INTERMEDIATE Intermediate     PIP/TAZO <=4 SENSITIVE Sensitive     Extended ESBL NEGATIVE Sensitive     * MODERATE ESCHERICHIA COLI  Novel Coronavirus, NAA (hospital order; send-out to ref lab)     Status: None   Collection Time: 01/18/19  2:38 PM  Result Value Ref Range Status   SARS-CoV-2, NAA NOT DETECTED NOT DETECTED Final    Comment: (NOTE) This test was developed and its performance characteristics determined by [employer]. This test has not been FDA cleared or approved. This test has been authorized by FDA under an Emergency Use Authorization (EUA). This test is only authorized for the  duration of time the declaration that circumstances exist justifying the authorization of the emergency use of in vitro diagnostic tests for detection of SARS-CoV-2 virus and/or diagnosis of COVID-19 infection under section 564(b)(1) of the Act, 21 U.S.C. 570VXB-9(T)(9), unless the authorization  is terminated or revoked sooner. When diagnostic testing is negative, the possibility of a false negative result should be considered in the context of a patient's recent exposures and the presence of clinical signs and symptoms consistent with COVID-19. An individual without symptoms of COVID-19 and who is not shedding SARS-CoV-2 virus would expect to have a negative (not detected) result in this assay. Performed  At: [facility] [address] [place], [place] [id number] Rush Farmer MD GY:[id number]    [place]  Final    Comment: Performed at [facility], [place] [address] [place], [place] [address]     Studies: No results found.  Scheduled Meds: . amiodarone  200 mg Oral Daily  . amoxicillin-clavulanate  1 tablet Oral BID  . aspirin  81 mg Oral Daily  . atorvastatin  80 mg Oral q1800  . calcitRIOL  0.5 mcg Oral Q T,Th,Sa-HD  . calcium acetate  667 mg Oral TID WC  . Chlorhexidine Gluconate Cloth  6 each Topical Q0600  . feeding supplement (PRO-STAT SUGAR FREE 64)  30 mL Oral BID  . gabapentin  300 mg Oral QHS  . guaiFENesin  600 mg Oral BID  . metoprolol succinate  25 mg Oral Daily  . multivitamin  1 tablet Oral Daily  . pantoprazole  40 mg Oral BID AC  . predniSONE  20 mg Oral Q breakfast  . sodium chloride flush  3 mL Intravenous Q12H  . umeclidinium-vilanterol  1 puff Inhalation Daily    Continuous Infusions: . sodium chloride Stopped (01/17/19 1759)  . sodium chloride    . sodium chloride       Time spent: 75mins I have personally reviewed and interpreted on  01/19/2019 daily labs, imagings as discussed above under date review  session and assessment and plans.  I reviewed all nursing notes, pharmacy notes, consultant notes,  vitals, pertinent old records  I have discussed plan of care as described above with RN , patient on 01/19/2019   Florencia Reasons MD, PhD  [facility] Pager [phone]. If 7PM-7AM, please contact night-coverage at [email or web address], password [facility] 01/19/2019, 10:44 PM  LOS: 26 days

## 2019-01-19 NOTE — TOC Progression Note (Addendum)
Transition of Care [facility]) - Progression Note    Patient Details  Name: Makhai Fulco MRN: [id number] Date of Birth: 19-Jul-1956  Transition of Care [facility]) CM/SW Contact  Sherrilyn Rist Phone Number: [phone] 01/19/2019, 1:13 PM  Clinical Narrative:    Patient has 2 bed offers, [facility]; patient is requesting to talk to his son and the Attending MD prior to making a decision.  1:30 pm CM talked to the 2 SNF's, they cannot do Dialysis/ cannot accept the patient; Inpatient rehab referral placed for weakness and deconditioning. Mindi Slicker RN,MHA,BSN   Expected Discharge Plan: Home/Self Care Barriers to Discharge: No Barriers Identified  Expected Discharge Plan and Services Expected Discharge Plan: Home/Self Care In-house Referral: NA Discharge Planning Services: CM Consult Post Acute Care Choice: NA Living arrangements for the past 2 months: Single Family Home Expected Discharge Date: 01/08/19               DME Arranged: N/A DME Agency: NA       HH Arranged: NA HH Agency: NA         Social Determinants of Health (SDOH) Interventions    Readmission Risk Interventions No flowsheet data found.

## 2019-01-19 NOTE — Progress Notes (Signed)
Nutrition Follow-up  RD working remotely.  DOCUMENTATION CODES:   Not applicable  INTERVENTION:   -Continue renal MVI daily  NUTRITION DIAGNOSIS:   Increased nutrient needs related to chronic illness(ESRD on HD) as evidenced by estimated needs.  Ongoing  GOAL:   Patient will meet greater than or equal to 90% of their needs  Progressing  MONITOR:   PO intake, Supplement acceptance, Labs, Weight trends, Skin, I & O's  REASON FOR ASSESSMENT:   LOS    ASSESSMENT:   Larry Klein is a 63 y.o. male with medical history significant of CAD, COPD, hypertension, ESRD on dialysis.  He reports chest pressure and tightness across his entire chest as well as dyspnea with exertion over the past few days.  This is constant in nature.  He denies any fevers or cough.  Had some nausea without vomiting, no diarrhea.  No worsening peripheral edema from his baseline.  He presented to [facility], underwent CTA chest which was negative for PE or any acute findings.  ED physician discussed with [facility] cardiology, was recommended to transfer to [facility] and start on IV heparin.  Patient was apparently also tested for low risk COVID-19 rule out.  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   Per chart  review, appetite and swallowing have improved. Noted meal completion 75-100%.   Pt agreeable to d/c to SNF once medically stable per [facility] notes.   Oncology has made arrangements for evaluation of lt calf mass at [facility] as well as follow-up at [facility].   Medications reviewed and include prednisone.   Labs reviewed: Phos: 6.5,  CBGS: 151.   Diet Order:   Diet Order            DIET SOFT Room service appropriate? Yes; Fluid consistency: Thin  Diet effective now              EDUCATION NEEDS:   Education needs have been addressed  Skin:  Skin Assessment: Skin Integrity Issues: Skin Integrity Issues:: Incisions Incisions: closed lt arm  Last BM:  01/11/19  Height:   Ht Readings from Last 1 Encounters:  01/13/19 5\' 11"  (1.803 m)    Weight:   Wt Readings from Last 1 Encounters:  01/19/19 92 kg    Ideal Body Weight:  78.2 kg  BMI:  Body mass index is 28.29 kg/m.  Estimated Nutritional Needs:   Kcal:  1950-2150  Protein:  100-115 grams  Fluid:  1000 ml + UOP    Karinne Schmader A. Jimmye Norman, RD, LDN, [place] Registered Dietitian II Certified Diabetes Care and Education Specialist Pager: [phone] After hours Pager: [phone]

## 2019-01-19 NOTE — Consult Note (Signed)
Physical Medicine and Rehabilitation Consult Reason for Consult: Decreased functional mobility Referring Physician: [facility]   HPI: Larry Klein is a 63 y.o. right-handed male with history of dysphagia x3 years, longstanding thrombocytopenia, mixed ischemic and nonischemic cardiomyopathy, CAD/CABG 2017, COPD, left forefoot amputation several years ago, hypertension, end-stage renal disease with hemodialysis.  Per chart review patient lives alone in a camping trailer on his son's property.  Son works during the day.  Patient reportedly independent prior to admission.  Presented to [facility] on 12/23/2018 with chest pain and shortness of breath.  He had some nausea without vomiting.  Troponin 0 0.27 CTA of chest at outside hospital negative for PE.  COVID-19 ruled out.  Placed on IV heparin and transferred to [facility].  Chest x-ray cardiac prominence stable aortic tortuosity.  No edema or consolidation.  Patient with recurrent thrombocytopenia which did limit anticoagulation and followed by hematology services.  He is on steroid trial for thrombocytopenia.  Nephrology follow-up for ongoing dialysis.  Echocardiogram with ejection fraction of 25% severely reduced systolic function.  Patient required cardiac catheterization proximal RCA to mid RCA lesion 100% stenosed.  Patient not felt to be a surgical candidate remain medical management.  Follow-up gastroenterology services 01/13/2023 chronic dysphagia and underwent EGD 01/13/2019 after barium esophagram showed complete obstruction with removal of a large fibrous food bolus from the distal esophagus.  Diet has slowly been advanced.  He did complete a course of antibiotics for aspiration pneumonia.  Patient with progressive right foot ischemia findings of depressed ABIs underwent aortogram revealed no significant aortoiliac disease and no role for revascularization.  Patient has had at least 2 falls while in the hospital attempting to get out  of bed.  Therapy evaluations completed recommendations of SNF versus CIR however patient difficult placement due to dialysis.   Review of Systems  Constitutional: Positive for malaise/fatigue. Negative for chills and fever.  HENT: Negative for hearing loss and tinnitus.   Eyes: Negative for blurred vision and double vision.  Respiratory: Positive for cough and shortness of breath.   Cardiovascular: Positive for chest pain, palpitations and leg swelling.  Gastrointestinal: Positive for constipation. Negative for heartburn and nausea.  Genitourinary: Negative for dysuria, flank pain and hematuria.  Musculoskeletal: Positive for falls, joint pain and myalgias.  Skin: Negative for rash.  Psychiatric/Behavioral: The patient has insomnia.    Past Medical History:  Diagnosis Date   COPD (chronic obstructive pulmonary disease) (HCC)    Coronary artery disease    Dyspnea    High cholesterol    Hypertension    Peripheral vascular disease ([place])    Renal disorder    Past Surgical History:  Procedure Laterality Date   AV FISTULA PLACEMENT Left 01/11/2019   Procedure: Creation Brachiocephalic Arteriovenous Fistula;  Surgeon: Elam Dutch, MD;  Location: [place];  Service: Vascular;  Laterality: Left;   CARDIAC SURGERY     CHOLECYSTECTOMY     CORONARY ARTERY BYPASS GRAFT     ESOPHAGOGASTRODUODENOSCOPY (EGD) WITH PROPOFOL N/A 01/13/2019   Procedure: ESOPHAGOGASTRODUODENOSCOPY (EGD) WITH PROPOFOL;  Surgeon: Milus Banister, MD;  Location: [facility] ENDOSCOPY;  Service: Endoscopy;  Laterality: N/A;   FOREIGN BODY REMOVAL  01/13/2019   Procedure: FOREIGN BODY REMOVAL;  Surgeon: Milus Banister, MD;  Location: [place];  Service: Endoscopy;;   LOWER EXTREMITY ANGIOGRAPHY Bilateral 01/08/2019   Procedure: LOWER EXTREMITY ANGIOGRAPHY;  Surgeon: [name], MD;  Location: [place] CV LAB;  Service: Cardiovascular;  Laterality: Bilateral;   RIGHT/LEFT HEART CATH AND  CORONARY/GRAFT ANGIOGRAPHY N/A 01/07/2019   Procedure: RIGHT/LEFT HEART CATH AND CORONARY/GRAFT ANGIOGRAPHY;  Surgeon: Lorretta Harp, MD;  Location: [place] CV LAB;  Service: Cardiovascular;  Laterality: N/A;   THROMBECTOMY W/ EMBOLECTOMY Left 09/18/2018   Procedure: ARTERIOVENOUS FISTULA LEFT ARM;  Surgeon: Rosetta Posner, MD;  Location: [facility] OR;  Service: Vascular;  Laterality: Left;   Family History  Problem Relation Age of Onset   Heart disease Mother    Heart disease Father    Social History:  reports that he quit smoking about 3 years ago. He has a 50.00 pack-year smoking history. He has never used smokeless tobacco. He reports that he does not drink alcohol or use drugs. Allergies: No Active Allergies Medications Prior to Admission  Medication Sig Dispense Refill   acetaminophen (TYLENOL) 500 MG tablet Take 1,000 mg by mouth as needed for mild pain or headache.     albuterol (PROVENTIL HFA;VENTOLIN HFA) 108 (90 Base) MCG/ACT inhaler Inhale 2 puffs into the lungs every 6 (six) hours as needed for wheezing.     amiodarone (PACERONE) 200 MG tablet Take 400mg  (2 tabs) twice a day for 12 more days, then decrease to 200mg  (1 tab) daily 120 tablet 3   aspirin EC 81 MG EC tablet Take 1 tablet (81 mg total) by mouth daily. 30 tablet 3   atorvastatin (LIPITOR) 40 MG tablet Take 1 tablet (40 mg total) by mouth at bedtime. 90 tablet 3   bismuth subsalicylate (PEPTO BISMOL) 262 MG/15ML suspension Take 30 mLs by mouth every 6 (six) hours as needed for indigestion.     calcium carbonate (TUMS - DOSED IN MG ELEMENTAL CALCIUM) 500 MG chewable tablet Chew 2 tablets by mouth 3 (three) times daily before meals.     ipratropium-albuterol (DUONEB) 0.5-2.5 (3) MG/3ML SOLN Take 3 mLs by nebulization every 6 (six) hours as needed (wheezing).     metoprolol succinate (TOPROL-XL) 50 MG 24 hr tablet Take 1 tablet (50 mg total) by mouth daily. Take with or immediately following a meal. 90 tablet 2    multivitamin (RENA-VIT) TABS tablet Take 1 tablet by mouth daily. 90 tablet 3   pantoprazole (PROTONIX) 40 MG tablet Take 1 tablet (40 mg total) by mouth daily before breakfast. 60 tablet 1   umeclidinium-vilanterol (ANORO ELLIPTA) 62.5-25 MCG/INH AEPB Inhale 1 puff into the lungs daily. 30 each 2    Home: Home Living Family/patient expects to be discharged to:: Private residence Living Arrangements: Children Available Help at Discharge: Available 24 hours/day, Family Type of Home: Mobile home Home Access: Stairs to enter Technical brewer of Steps: 4 Entrance Stairs-Rails: Right, Left Home Layout: One level Bathroom Shower/Tub: Multimedia programmer: Handicapped height Home Equipment: Radio producer - single point, Shower seat - built in Additional Comments: drives himself to HD  Functional History: Prior Function Level of Independence: Independent Comments: drives to HD T,TH,Sat Functional Status:  Mobility: Bed Mobility Overal bed mobility: Modified Independent General bed mobility comments: supine to and from sitting Transfers Overall transfer level: Needs assistance Equipment used: Rolling walker (2 wheeled) Transfers: Sit to/from Stand Sit to Stand: Min guard General transfer comment: cues for hand placement and safety. PT would not allow fall matt to be removed from floor prior to standing. X 2 trials with initial trial with posterior bias and reliance on LE support from bed. Improved standing balance on 2nd trial Ambulation/Gait Ambulation/Gait assistance: Min assist Gait Distance (Feet): 200 Feet  Assistive device: Rolling walker (2 wheeled) Gait Pattern/deviations: Step-through pattern, Decreased stride length, Trunk flexed, Wide base of support General Gait Details: pt with maintained trunk flexion, too posterior to RW with cues for posture and position on RW with pt defiant and not willing to follow cues for safety. Pt with decreased speed, safety and activity  tolerance with reliance on RW for stability Gait velocity: Decreased Gait velocity interpretation: 1.31 - 2.62 ft/sec, indicative of limited community ambulator Stairs: Yes Stairs assistance: Min assist, +2 safety/equipment Stair Management: Forwards, Step to pattern, With crutches Number of Stairs: 4 General stair comments: pt stepping up with good pattern with 2 posterior LOB with ascent needing assist to maintain balance. Pt on descent with LOB anteriorly skipping a step with max assist to maintain upright and recover balance. PT educated for using rails at home and not crutches along with assist due to LOB with stairs and need for assist    ADL: ADL Overall ADL's : Needs assistance/impaired Grooming: Wash/dry hands, Standing, Min guard Upper Body Dressing : Set up, Sitting Toilet Transfer: Min guard, Ambulation, Regular Toilet, Grab bars Toileting- Clothing Manipulation and Hygiene: Min guard, Sit to/from stand Functional mobility during ADLs: Min guard, Rolling walker General ADL Comments: Pt voicing frustration with how he typically is steady with ambulation and now with frequent falls.  Cognition: Cognition Overall Cognitive Status: Impaired/Different from baseline Orientation Level: Oriented X4 Cognition Arousal/Alertness: Awake/alert Behavior During Therapy: Flat affect Overall Cognitive Status: Impaired/Different from baseline Area of Impairment: Safety/judgement, Memory, Following commands Memory: Decreased short-term memory Following Commands: Follows one step commands consistently Safety/Judgement: Decreased awareness of deficits, Decreased awareness of safety General Comments: pt with very poor insight into function and deficits, tangential at times, poor problem solving, pt perseverating on fall this am yet discussing first fall then relaying varied reports of how today's fall occurrec  Blood pressure (!) 96/59, pulse 64, temperature (!) 97.2 F (36.2 C), temperature  source Oral, resp. rate 18, height 5\' 11"  (1.803 m), weight 92 kg, SpO2 96 %. Physical Exam  Neurological:  Patient sitting up at edge of bed eating lunch.  Complaints of dizziness.  Oriented to person, place and time.  Follows commands.  Skin:  Patient with left forefoot amputation that is well-healed.  Ischemic changes right lower extremity with increased swelling.    Results for orders placed or performed during the hospital encounter of 12/23/18 (from the past 24 hour(s))  Novel Coronavirus, NAA (hospital order; send-out to ref lab)     Status: None   Collection Time: 01/18/19  2:38 PM  Result Value Ref Range   SARS-CoV-2, NAA NOT DETECTED NOT DETECTED   Coronavirus Source NASOPHARYNGEAL   Glucose, capillary     Status: Abnormal   Collection Time: 01/18/19  4:17 PM  Result Value Ref Range   Glucose-Capillary 151 (H) 70 - 99 mg/dL  CBC     Status: Abnormal   Collection Time: 01/19/19  4:17 AM  Result Value Ref Range   WBC 12.8 (H) 4.0 - 10.5 K/uL   RBC 3.75 (L) 4.22 - 5.81 MIL/uL   Hemoglobin 10.5 (L) 13.0 - 17.0 g/dL   HCT 33.1 (L) 39.0 - 52.0 %   MCV 88.3 80.0 - 100.0 fL   MCH 28.0 26.0 - 34.0 pg   MCHC 31.7 30.0 - 36.0 g/dL   RDW 19.2 (H) 11.5 - 15.5 %   Platelets 75 (L) 150 - 400 K/uL   nRBC 0.0 0.0 - 0.2 %  Renal function panel     Status: Abnormal   Collection Time: 01/19/19  7:29 AM  Result Value Ref Range   Sodium 138 135 - 145 mmol/L   Potassium 4.6 3.5 - 5.1 mmol/L   Chloride 96 (L) 98 - 111 mmol/L   CO2 22 22 - 32 mmol/L   Glucose, Bld 135 (H) 70 - 99 mg/dL   BUN 73 (H) 8 - 23 mg/dL   Creatinine, Ser 8.46 (H) 0.61 - 1.24 mg/dL   Calcium 8.5 (L) 8.9 - 10.3 mg/dL   Phosphorus 6.5 (H) 2.5 - 4.6 mg/dL   Albumin 2.7 (L) 3.5 - 5.0 g/dL   GFR calc non Af Amer 6 (L) >60 mL/min   GFR calc Af Amer 7 (L) >60 mL/min   Anion gap 20 (H) 5 - 15   No results found.   Assessment/Plan: Diagnosis: failure to thrive and history of gait disorder and falls in setting of  numerous medical conditions 1. Does the need for close, 24 hr/day medical supervision in concert with the patient's rehab needs make it unreasonable for this patient to be served in a less intensive setting? No 2. Co-Morbidities requiring supervision/potential complications:   3. Due to bladder management, safety, disease management and medication administration, does the patient require 24 hr/day rehab nursing? No 4. Does the patient require coordinated care of a physician, rehab nurse, PT, OT to address physical and functional deficits in the context of the above medical diagnosis(es)? No Addressing deficits in the following areas: n/a 5. Can the patient actively participate in an intensive therapy program of at least 3 hrs of therapy per day at least 5 days per week? Potentially 6. The potential for patient to make measurable gains while on inpatient rehab is poor 7. Anticipated functional outcomes upon discharge from inpatient rehab are n/a  with PT, n/a with OT, n/a with SLP. 8. Estimated rehab length of stay to reach the above functional goals is:   9. Anticipated D/C setting: Other 10. Anticipated post D/C treatments: N/A 11. Overall Rehab/Functional Prognosis: fair  RECOMMENDATIONS: This patient's condition is appropriate for continued rehabilitative care in the following setting: SNF Patient has agreed to participate in recommended program. Potentially Note that insurance prior authorization may be required for reimbursement for recommended care.  Comment: Patient has been experiencing a long term decline and issues with gait which a brief admit on inpatient rehab won't solve. Recommend longer term plan for rehab which could could include SNF and potential rest-home level care.   Meredith Staggers, MD, [facility] 01/19/2019   Lavon Paganini [place], PA-C 01/19/2019

## 2019-01-19 NOTE — Progress Notes (Addendum)
[facility] Progress Note   Subjective: On HD. Says he feels OK. Asking if he can have HD MWFS when he is discharged. (Not a bad idea). Says he has some intermittent chest pain but overall doing well.   Objective Vitals:   01/19/19 0715 01/19/19 0720 01/19/19 0730 01/19/19 0800  BP: 104/79 127/87 125/89 121/61  Pulse: 64 74 81 62  Resp: 17 18 19 20   Temp: (!) 97.4 F (36.3 C)     TempSrc: Oral     SpO2: 98%     Weight: 92 kg     Height:       Physical Exam General: Chronically ill appearing male, pleasant, NAD Heart: S1,S2 RRR Lungs: CTAB anteriorly, decreased in bases posteriorly. No WOB.  Abdomen: Active BS Extremities: 2+ pitting BLE. L TMA Dialysis Access: LIJ TDC blood lines connected. L AVF + bruit throughtout.   Additional Objective Labs: Basic Metabolic Panel: Recent Labs  Lab 01/15/19 0359 01/16/19 0250 01/19/19 0729  NA 138 137 138  K 5.1 4.9 4.6  CL 100 98 96*  CO2 20* 20* 22  GLUCOSE 121* 121* 135*  BUN 42* 61* 73*  CREATININE 5.47* 6.94* 8.46*  CALCIUM 8.5* 8.9 8.5*  PHOS  --   --  6.5*   Liver Function Tests: Recent Labs  Lab 01/15/19 0359 01/19/19 0729  AST 23  --   ALT 10  --   ALKPHOS 93  --   BILITOT 1.2  --   PROT 5.6*  --   ALBUMIN 2.8* 2.7*   No results for input(s): LIPASE, AMYLASE in the last 168 hours. CBC: Recent Labs  Lab 01/15/19 0359 01/16/19 0250 01/17/19 0814 01/18/19 0543 01/19/19 0417  WBC 15.6* 15.7* 12.8* 11.7* 12.8*  NEUTROABS 14.6*  --   --   --   --   HGB 11.1* 11.1* 10.8* 11.0* 10.5*  HCT 34.9* 34.9* 33.9* 33.9* 33.1*  MCV 89.5 88.1 87.8 88.3 88.3  PLT 65* 93* 73* 86* 75*   Blood Culture    Component Value Date/Time   SDES EXPECTORATED SPUTUM 01/17/2019 0922   SDES EXPECTORATED SPUTUM 01/17/2019 0922   SPECREQUEST NONE 01/17/2019 0922   SPECREQUEST NONE Reflexed from [id number] 01/17/2019 0922   CULT MODERATE ESCHERICHIA COLI 01/17/2019 0922   REPTSTATUS 01/17/2019 FINAL 01/17/2019 0922    REPTSTATUS PENDING 01/17/2019 5093    Cardiac Enzymes: No results for input(s): CKTOTAL, CKMB, CKMBINDEX, TROPONINI in the last 168 hours. CBG: Recent Labs  Lab 01/12/19 1122 01/18/19 1617  GLUCAP 129* 151*   Iron Studies: No results for input(s): IRON, TIBC, TRANSFERRIN, FERRITIN in the last 72 hours. @lablastinr3 @ Studies/Results: No results found. Medications: . sodium chloride Stopped (01/17/19 1759)  . sodium chloride    . sodium chloride    . ampicillin-sulbactam (UNASYN) IV 3 g (01/18/19 1637)   . amiodarone  200 mg Oral Daily  . aspirin  81 mg Oral Daily  . atorvastatin  80 mg Oral q1800  . calcitRIOL  0.5 mcg Oral Q T,Th,Sa-HD  . Chlorhexidine Gluconate Cloth  6 each Topical Q0600  . gabapentin  300 mg Oral QHS  . guaiFENesin  600 mg Oral BID  . metoprolol succinate  25 mg Oral Daily  . multivitamin  1 tablet Oral Daily  . pantoprazole  40 mg Oral BID AC  . predniSONE  20 mg Oral Q breakfast  . sodium chloride flush  3 mL Intravenous Q12H  . umeclidinium-vilanterol  1 puff Inhalation Daily  TTS [place] 4h400/800 92.5kg 3K/2.25bath P2 LIJTDC Heparin none -Calcitriol 0.5 mcg PO TIW  Problem/Plan: 1.CAD with unstable angina:S/p cardiac cath, on medical therapy. Management per primary. 2. Nausea/dysphagia:DG esophagus showed obstruction of distal esophagus. S/p EGD4/29with removal of obstruction. Management per GI/Primary.  3.RUL aspiration PNA:in hospital Dx, on IV Unasyn , perprimary team. 2. ESRD:TTS schedule.HD today on schedule. No heparin. K+ 4.6 . Pt started HD in [place] 12/2017, moved here Jan 2020 and set up at [facility] HD.S/pL BCAVF placement on 4/27(L RC AVF Jan '20 didn't work). To follow up with [facility] in 1 month. 3.HTN/volume:Under dry wt here.BP's soft.Lower dry wt dc.Some LE edema, limited UF due to low BP's.Attempting 2.5. Tolerating well.   4. Anemiackd:Hgb10.5 No ESA indicated at this time.Follow  HGB.  5. Secondary hyperparathyroidism:Calcium 8.5, corrected 9.1. Continue calcitriol. Phos 6.5, not currently on a binder.Start calcium acetate 667 mg PO TID AC.  6. Nutrition:Albumin 2.7. Poor appetite secondary to nausea/dysphagia at this time. Add prostat, renal vits. 7. PAD s/p aortic stent, leg swelling:Reportedly no role for revascularization. 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  8. A.fib:Not on anticoagulation due to thrombocytopenia. Rate currently controlled. Per primary/cardiology.  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 10. COPD:Stable dyspneaGeneral:   Rita H. Brown NP-C 01/19/2019, 9:15 AM  [employer] [phone]    I have seen and examined this patient and agree with the plan of care. Appears to need Lt BKA pending w/u given likely malignancy. F/U w/ [facility] orthopedic oncology.   Seen on HD LIJ TC Great bruit in lt BCF, good augmentation as well 2/2.5  UF goal 3L (net 2.5L) 124/81 VSS  Vincente Asbridge, Hunt Oris, MD 01/19/2019, 10:07 AM

## 2019-01-19 NOTE — Progress Notes (Signed)
Patient has been off floor for HMD tx since 0700 this am unable to assess or give meds thus far,will assess status and give meds when he arrives to the unit.

## 2019-01-19 NOTE — Progress Notes (Addendum)
IP PROGRESS NOTE  Subjective:  [name] continues to have dyspnea.  He reports several falls and is frustrated by this.  It was recommended that he go to a skilled nursing facility for rehabilitation however, they cannot accept him due to dialysis.  He is under evaluation for inpatient rehabilitation.  Objective: Vital signs in last 24 hours: Blood pressure (!) 96/59, pulse 64, temperature (!) 97.2 F (36.2 C), temperature source Oral, resp. rate 18, height 5\' 11"  (1.803 m), weight 202 lb 13.2 oz (92 kg), SpO2 96 %.  Intake/Output from previous day: 05/04 0701 - 05/05 0700 In: 360 [P.O.:360] Out: 0   Physical Exam:  HEENT: no thrush Lungs: Diminished breath sounds bilaterally. Extremities: There is masslike fullness in the left lower leg, erythema at the right and left foot. Trace pitting edema at the low leg bilaterally  Lab Results: Recent Labs    01/18/19 0543 01/19/19 0417  WBC 11.7* 12.8*  HGB 11.0* 10.5*  HCT 33.9* 33.1*  PLT 86* 75*    BMET Recent Labs    01/19/19 0729  NA 138  K 4.6  CL 96*  CO2 22  GLUCOSE 135*  BUN 73*  CREATININE 8.46*  CALCIUM 8.5*     Medications: I have reviewed the patient's current medications.  Assessment/Plan:  1.  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  Renal failure-on hemodialysis, dialysis fistula 01/11/2019 3.  Coronary artery disease-cardiac catheterization 01/07/2019- severe ostial left circumflex lesion, medical therapy recommended, aspirin restarted 01/07/2019 4.  COPD 5.  Peripheral vascular disease-lower extremity arteriogram 01/08/2019, distal bilateral arterial vascular disease, medical therapy recommended 6.  Anemia 7.  CHF 8.  Left calf mass on CTA 12/28/2018, ultrasound 12/29/2018- solid mass without vascularity 9.  Elevated liver enzymes - Resolved 10.  Aspiration pneumonia  [name] appears unchanged.  He continues hemodialysis. He has  persistent mild thrombocytopenia, improved on prednisone.  I recommend beginning a slow prednisone taper at discharge.  Recommendations: 1.  Continue Prednisone 20 mg daily for now, will taper as an outpatient.  2.  An outpatient referral has been placed to Dr. Magda Bernheim at [facility] to evaluate the left calf mass 3.  Outpatient follow-up will be scheduled at the [facility] with Dr. Hosie Poisson.  I have contacted them today and we will need to call them when the patient is discharged to arrange follow-up.   LOS: 26 days   [name], NP   01/19/2019, 1:06 PM  [name] appears stable.  He reports having several falls.  The platelet count is lower, but remains adequate.  The plan is for follow-up with Dr. Hinton Rao at discharge.  Julieanne Manson, MD

## 2019-01-19 NOTE — Progress Notes (Signed)
Inpatient Rehabilitation-Admissions Coordinator   Please see consult note by PM&R MD, Dr. Naaman Plummer. Pt has been recommended for SNF placement at this time. AC spoke with pt at the bedside who verbalized understanding. AC will sign off.   Please call if questions.   Jhonnie Garner, OTR/L  Rehab Admissions Coordinator  [phone] 01/19/2019 5:28 PM

## 2019-01-20 ENCOUNTER — Telehealth: Payer: Self-pay | Admitting: Vascular Surgery

## 2019-01-20 DIAGNOSIS — R945 Abnormal results of liver function studies: Secondary | ICD-10-CM

## 2019-01-20 LAB — CBC
HCT: 35.1 % — ABNORMAL LOW (ref 39.0–52.0)
Hemoglobin: 11 g/dL — ABNORMAL LOW (ref 13.0–17.0)
MCH: 28 pg (ref 26.0–34.0)
MCHC: 31.3 g/dL (ref 30.0–36.0)
MCV: 89.3 fL (ref 80.0–100.0)
Platelets: 79 10*3/uL — ABNORMAL LOW (ref 150–400)
RBC: 3.93 MIL/uL — ABNORMAL LOW (ref 4.22–5.81)
RDW: 19.1 % — ABNORMAL HIGH (ref 11.5–15.5)
WBC: 12.5 10*3/uL — ABNORMAL HIGH (ref 4.0–10.5)
nRBC: 0.2 % (ref 0.0–0.2)

## 2019-01-20 LAB — CULTURE, RESPIRATORY W GRAM STAIN

## 2019-01-20 LAB — CULTURE, RESPIRATORY

## 2019-01-20 MED ORDER — HYDROCODONE-ACETAMINOPHEN 5-325 MG PO TABS: 1.0000 | ORAL_TABLET | Freq: Four times a day (QID) | ORAL | 0 refills | Status: AC | PRN

## 2019-01-20 MED ORDER — DIAZEPAM 10 MG PO TABS: 10.0000 mg | ORAL_TABLET | Freq: Every evening | ORAL | 0 refills | Status: AC | PRN

## 2019-01-20 MED ORDER — PREDNISONE 20 MG PO TABS: 20.0000 mg | ORAL_TABLET | Freq: Every day | ORAL | 1 refills | Status: AC

## 2019-01-20 MED ORDER — AMOXICILLIN-POT CLAVULANATE 500-125 MG PO TABS: 1.0000 | ORAL_TABLET | Freq: Two times a day (BID) | ORAL | 0 refills | Status: DC

## 2019-01-20 MED ORDER — GABAPENTIN 600 MG PO TABS: 300.0000 mg | ORAL_TABLET | Freq: Every day | ORAL | 3 refills | Status: AC

## 2019-01-20 MED ORDER — GUAIFENESIN ER 600 MG PO TB12: 600.0000 mg | ORAL_TABLET | Freq: Two times a day (BID) | ORAL | 0 refills | Status: AC | PRN

## 2019-01-20 MED ORDER — AMIODARONE HCL 200 MG PO TABS: 200.0000 mg | ORAL_TABLET | Freq: Every day | ORAL | 3 refills | Status: DC

## 2019-01-20 MED ORDER — CEFPODOXIME PROXETIL 200 MG PO TABS
200.0000 mg | ORAL_TABLET | ORAL | Status: DC
  Administered 2019-01-20: 200 mg via ORAL
  Filled 2019-01-20 (×2): qty 1

## 2019-01-20 MED ORDER — METOPROLOL SUCCINATE ER 25 MG PO TB24: 25.0000 mg | ORAL_TABLET | Freq: Every day | ORAL | 3 refills | Status: DC

## 2019-01-20 NOTE — Telephone Encounter (Signed)
sch appt [facility]tr 02/25/2019 12pm Dialysis Duplex 1240pm p/o MD

## 2019-01-20 NOTE — Progress Notes (Signed)
Physical Therapy Treatment Patient Details Name: Larry Klein MRN: [id number] DOB: December 07, 1955 Today's Date: 01/20/2019    History of Present Illness Pt is a 63 y.o. 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 fib with RVR, COPD who was admitted on 12/23/2018 with complaints of chest pain and dyspnea. Cardiology felt not a candidate for surgical revascularization. 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 Pt with fall on 4/28 and endoscopy 4/29 to remove esophageal mass, LUE fistula.     PT Comments    Continuing work on functional mobility and activity tolerance;  Able to walk the hallway today, distance limited by back pain (that increased with more distance), and Larry Klein was appropriately conservative in his approach to walking due to recent falls; His safety awareness is questionable though, as he refused to allow me to put the fall safety mats back down in his room -- but he did describe to me one of his falls at length, and wants to figure out why he has fallen; Modified orthostatics taken (see below), negative for significant BP drop   Follow Up Recommendations  SNF;Supervision/Assistance - 24 hour     Equipment Recommendations  Rolling walker with 5" wheels    Recommendations for Other Services       Precautions / Restrictions Precautions Precautions: Fall Precaution Comments: left transmet, pt with falls x 2 this hospitalization    Mobility  Bed Mobility               General bed mobility comments: EOB upon arrival  Transfers Overall transfer level: Needs assistance Equipment used: Rolling walker (2 wheeled) Transfers: Sit to/from Stand Sit to Stand: Min guard         General transfer comment: Close guard for safety (pt asking PT to stand back); cues tor hand placement with control of descent to sit  Ambulation/Gait Ambulation/Gait  assistance: Min guard(with physical contact guard) Gait Distance (Feet): 90 Feet Assistive device: Rolling walker (2 wheeled) Gait Pattern/deviations: Step-through pattern;Decreased stride length;Trunk flexed;Wide base of support Gait velocity: Decreased   General Gait Details: pt with maintained trunk flexion, too posterior to RW with cues for posture and position on RW with pt defiant and not willing to follow cues for safety. Pt with decreased speed, safety and activity tolerance with reliance on RW for stability; he did indicate he kept his positioning relative to the rW because of back pain that increases with increased distance   Stairs             Wheelchair Mobility    Modified Rankin (Stroke Patients Only)       Balance     Sitting balance-Leahy Scale: Good       Standing balance-Leahy Scale: Poor Standing balance comment: able to stand without UE assist, reliant on UE support for gait                            Cognition Arousal/Alertness: Awake/alert Behavior During Therapy: Flat affect Overall Cognitive Status: Impaired/Different from baseline                           Safety/Judgement: Decreased awareness of deficits;Decreased awareness of safety     General Comments: pt with very poor insight into function and deficits, tangential at times, poor problem solving, pt perseverating on  falls in hospital, wants to know why he has fallen, but yet refusing fall risk asafety measures; He adamantly refused putting mat back on floor once he was settled in teh chair; Nurse ttech notified      Exercises      General Comments General comments (skin integrity, edema, etc.): Orthostatics taken (except for supine);   01/20/19 0902  Orthostatic Sitting  BP- Sitting 117/72  Pulse- Sitting 84  Orthostatic Standing at 0 minutes  BP- Standing at 0 minutes 111/73  Pulse- Standing at 0 minutes 79  Orthostatic Standing at 3 minutes  BP- Standing  at 3 minutes 119/75  Pulse- Standing at 3 minutes 91         Pertinent Vitals/Pain Pain Assessment: Faces Faces Pain Scale: Hurts little more Pain Location: Back after ambulating Pain Descriptors / Indicators: Aching;Discomfort Pain Intervention(s): Limited activity within patient's tolerance    Home Living                      Prior Function            PT Goals (current goals can now be found in the care plan section) Acute Rehab PT Goals Patient Stated Goal: Agreeable to walk; difficult to get specific goal, but at one point, indicated he would like to know why he fell PT Goal Formulation: With patient Time For Goal Achievement: 01/29/19 Potential to Achieve Goals: Good Progress towards PT goals: Not progressing toward goals - comment(limited by back pain today)    Frequency    Min 3X/week      PT Plan Current plan remains appropriate    Co-evaluation              AM-PAC PT "6 Clicks" Mobility   Outcome Measure  Help needed turning from your back to your side while in a flat bed without using bedrails?: None Help needed moving from lying on your back to sitting on the side of a flat bed without using bedrails?: A Little Help needed moving to and from a bed to a chair (including a wheelchair)?: A Little Help needed standing up from a chair using your arms (e.g., wheelchair or bedside chair)?: A Little Help needed to walk in hospital room?: A Little Help needed climbing 3-5 steps with a railing? : A Little 6 Click Score: 19    End of Session Equipment Utilized During Treatment: Gait belt Activity Tolerance: Patient tolerated treatment well Patient left: in chair;with call bell/phone within reach;with chair alarm set Nurse Communication: Mobility status;Precautions PT Visit Diagnosis: Other abnormalities of gait and mobility (R26.89);Muscle weakness (generalized) (M62.81);Unsteadiness on feet (R26.81);Difficulty in walking, not elsewhere classified  (R26.2)     Time: 3825-0539 PT Time Calculation (min) (ACUTE ONLY): 35 min  Charges:  $Gait Training: 8-22 mins $Therapeutic Activity: 8-22 mins                     Roney Marion, PT  Acute Rehabilitation Services Pager [phone] Office [place] 01/20/2019, 10:23 AM

## 2019-01-20 NOTE — Progress Notes (Addendum)
Subjective:  Up in bedside  chair , hd yest 2.7 uf , asking about MWF Sat  Op hd  For vol control ( but currently TTS  Op ) 4 fluid cups at Bedside / Stressed vol compliance   Objective Vital signs in last 24 hours: Vitals:   01/19/19 1944 01/20/19 0223 01/20/19 0412 01/20/19 0811  BP: 102/65  114/84   Pulse: (!) 40  69   Resp: 20  20   Temp: 98.1 F (36.7 C)  97.6 F (36.4 C)   TempSrc: Oral  Oral   SpO2: 96%  99% 97%  Weight:  88.1 kg    Height:       Weight change: 1.87 kg  Physical Exam: General:  Alert , Chronically ill appearing male, pleasant, NAD Heart: S1,S2 RRR Lungs: CTAB anteriorly, decreased in bases posteriorly. No WOB.  Abdomen:  bs pos. Soft , NT, ND Extremities: 2-3+ pitting BLE. L TMA Dialysis Access: LIJ TDC /. L AVF + bruit   op hd =TTS [facility] 4h400/800 92.5kg 3K/2.25bath P2 LIJTDC Heparin none -Calcitriol 0.5 mcg PO TIW  Problem/Plan: 1.CAD with unstable angina:S/p cardiac cath, on medical therapy. Management per primary. 2. Nausea/dysphagia:DG esophagus showed obstruction of distal esophagus. S/p EGD4/29with removal of obstruction. Management per GI/Primary.  3.RUL aspiration PNA:in hospital Dx, on IV Unasyn , perprimary team. 2. ESRD:TTS schedule.HD  on schedule. No heparin . Pt started HD in [place] 12/2017, moved here Jan 2020 and set up at [facility] HD.S/pL BCAVF placement on 01/11/19(L RC AVF Jan '20 didn't work). To follow up with [facility] in 1 month. 3.HTN/volume:Under dry wt here.BP's soft.Lower dry wt dc.Some LE edema, limited UF due to low BP's.Attempting 2.5. Tolerating well.   4. Anemiackd:Hgb10.5 No ESA indicated at this time.Follow HGB.  5. Secondary hyperparathyroidism:Calcium 8.5, corrected 9.1. Continue calcitriol. Phos 6.5, not currently on a binder.Start calcium acetate 667 mg PO TID AC.  6. Nutrition:Albumin 2.7. Poor appetite secondary to nausea/dysphagia at this time. Add prostat, renal  vits. 7. PAD s/p aortic stent, leg swelling:Reportedly no role for revascularization. Also with left leg mass on MRI suggesting malignant process, plans for follow up with orthopedic oncology at [facility] after discharge.  8. A.fib:Not on anticoagulation due to thrombocytopenia. Rate currently controlled. Per primary/cardiology.  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 COPD:Stable dyspnea  Ernest Haber, PA-C [facility] [phone] 01/20/2019,9:29 AM  LOS: 27 days   I have seen and examined this patient and agree with the plan of care. Plan on HD in the AM 1st shift; already spoke with staff. Pt will be d/c after HD in the AM.  Dwana Melena, MD 01/20/2019, 11:42 AM    Labs: Basic Metabolic Panel: Recent Labs  Lab 01/15/19 0359 01/16/19 0250 01/19/19 0729  NA 138 137 138  K 5.1 4.9 4.6  CL 100 98 96*  CO2 20* 20* 22  GLUCOSE 121* 121* 135*  BUN 42* 61* 73*  CREATININE 5.47* 6.94* 8.46*  CALCIUM 8.5* 8.9 8.5*  PHOS  --   --  6.5*   Liver Function Tests: Recent Labs  Lab 01/15/19 0359 01/19/19 0729  AST 23  --   ALT 10  --   ALKPHOS 93  --   BILITOT 1.2  --   PROT 5.6*  --   ALBUMIN 2.8* 2.7*   No results for input(s): LIPASE, AMYLASE in the last 168 hours. No results for input(s): AMMONIA in the last 168 hours. CBC:  Recent Labs  Lab 01/15/19 0359 01/16/19 0250 01/17/19 0814 01/18/19 0543 01/19/19 0417 01/20/19 0446  WBC 15.6* 15.7* 12.8* 11.7* 12.8* 12.5*  NEUTROABS 14.6*  --   --   --   --   --   HGB 11.1* 11.1* 10.8* 11.0* 10.5* 11.0*  HCT 34.9* 34.9* 33.9* 33.9* 33.1* 35.1*  MCV 89.5 88.1 87.8 88.3 88.3 89.3  PLT 65* 93* 73* 86* 75* 79*   Cardiac Enzymes: No results for input(s): CKTOTAL, CKMB, CKMBINDEX, TROPONINI in the last 168 hours. CBG: Recent Labs  Lab 01/18/19 1617  GLUCAP 151*    Studies/Results: No results found. Medications: . sodium chloride Stopped (01/17/19  1759)  . sodium chloride    . sodium chloride     . amiodarone  200 mg Oral Daily  . amoxicillin-clavulanate  1 tablet Oral BID  . aspirin  81 mg Oral Daily  . atorvastatin  80 mg Oral q1800  . calcitRIOL  0.5 mcg Oral Q T,Th,Sa-HD  . calcium acetate  667 mg Oral TID WC  . Chlorhexidine Gluconate Cloth  6 each Topical Q0600  . feeding supplement (PRO-STAT SUGAR FREE 64)  30 mL Oral BID  . gabapentin  300 mg Oral QHS  . guaiFENesin  600 mg Oral BID  . metoprolol succinate  25 mg Oral Daily  . multivitamin  1 tablet Oral Daily  . pantoprazole  40 mg Oral BID AC  . predniSONE  20 mg Oral Q breakfast  . sodium chloride flush  3 mL Intravenous Q12H  . umeclidinium-vilanterol  1 puff Inhalation Daily

## 2019-01-20 NOTE — Progress Notes (Signed)
[facility]                                                                              Patient Demographics  Larry Klein, is a 63 y.o. male, DOB - 02/21/1956, SFK:[id number]  Admit date - 12/23/2018   Admitting Physician Vianne Bulls, MD  Outpatient Primary MD for the patient is Patient, No Pcp Per  Outpatient specialists:   LOS - 27  days   Medical records reviewed and are as summarized below:    No chief complaint on file.      Brief summary   Patient is a 62 year old male with CAD, COPD, hypertension, ESRD on dialysis presented from [facility] with chest pain, dyspnea on exertion, no fevers or chills.  Patient had undergone CT angiogram of the chest at [facility] which was negative for PE.  Troponins were positive, patient was started on a heparin drip.  COVID test was negative.  Patient was transferred to [facility]. Cardiology evaluated and did not feel candidate for surgical or percutaneous revascularization (prior CABG, thrombocytopenia), added Imdur and has signed off. Nephrology has been following for hemodialysis. Vascular surgery was consulted for right foot pain, hematology consulted for chronic thrombocytopenia. Patient was placed on a steroid trial with improvement on the platelet count. Patient underwent cardiac cath and now on medical management, also underwent lower extremity aortogram, and no role of revascularization Currently awaiting skilled nursing facility.  Assessment & Plan    Principal Problem:   Chest pain/unstable angina with history of CAD -Cardiology was consulted, underwent cardiac cath.  Plan to continue medical therapy: Aspirin, Lipitor, Imdur (held of 4/30 due to borderline BP) -Toprol decreased with holding parameters to 25 mg daily.  Cardiology has signed off.  Active Problems: Dysphagia/odynophagia: In the setting of chronic dysphagia and GERD - Patient complained of odynophagia and nausea and vomiting, also  has chronic dysphagia for last 3 years prior to admission. -Barium esophagogram showed complete obstruction of the distal esophagus. -Underwent urgent EGD on 4/29 with removal of large fibrous food bolus from the distal esophagus. -Per GI, will need repeat EGD in few weeks. -Continue soft diet  Aspiration pneumonia -Likely from #2, on 4/30, patient's BP was found to be soft, leukocytosis.  Chest x-ray showed right upper lung pneumonia, patient was started on Unasyn and transitioned to Augmentin for total 7 days. -Currently no wheezing, tolerating soft diet will need repeat EGD in few weeks.  Acute on chronic systolic CHF -EF 20 to 17% per echo on 3/24.  Volume management with hemodialysis -Appreciate cardiology and nephrology following.  Continue Toprol.  Needs follow-up echo in 3 months.  Permanent atrial fibrillation -Rate controlled, continue amiodarone -Not on anticoagulation due to thrombocytopenia, patient was to follow-up with hematology in [place], seen by hematology inpatient.   Mild transaminitis in the setting of CHF -LFTs stable, acute viral hepatitis panel negative   ESRD on hemodialysis TTS -Nephrology following, volume management with hemodialysis -Status post left UE AVF on 4/27  PAD status post aortic stent graft with right leg pain, swelling -Vascular surgery was consulted.  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  -Patient was placed on steroid trial since 4/14 -Platelets improving, steroids tapered to 20 mg daily.  Recommended slow taper as outpatient and follow-up with labs at [place] [facility].  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  Mechanical falls -Patient refuses bed alarm, does not have any orthostasis, not witnessed.  Discussed with staff to document the falls.  Code Status: Full code DVT Prophylaxis: Not on SCDs due to leg pain, not on pharmacological anticoagulation due to thrombocytopenia Family Communication: Discussed in detail with the patient, all imaging results, lab results explained to the patient    Disposition Plan: Patient had skilled nursing facility bed offer today however he is refusing skilled nursing facility bed offer stating that he does not like the skilled nursing facility.  Now he wants to go home, but has no transportation for HD tomorrow.  Discussed with Dr. Augustin Coupe, nephrology who recommended DC home after first shift HD tomorrow a.m. Case manager will arrange ambulance transportation after HD.  Time Spent in minutes 35 minutes  Procedures:   Hemodialysis 4/27 LUE AVF  RIGHT/LEFT HEART CATH AND CORONARY/GRAFT ANGIOGRAPH  Prox RCA to Mid RCA lesion is 100% stenosed.  Ost LM to Mid LM lesion is 50% stenosed.  Ost Cx lesion is 95% stenosed.  Prox LAD lesion is 100% stenosed.  Ost 1st Diag to 1st Diag lesion is 70% stenosed.  Origin to Insertion lesion before 1st Diag is 100% stenoseD  Consultants:    Vascular surgery  Hematology  Nephrology  cardiology  Antimicrobials:   Anti-infectives (From admission, onward)   Start     Dose/Rate Route Frequency Ordered Stop   01/19/19 2245  amoxicillin-clavulanate (AUGMENTIN) 500-125 MG per tablet 500 mg     1 tablet Oral 2 times daily 01/19/19 2244 01/21/19 2059   01/14/19 1600  Ampicillin-Sulbactam (UNASYN) 3 g in sodium chloride 0.9 % 100 mL IVPB  Status:  Discontinued     3 g 200 mL/hr over 30 Minutes Intravenous Every 24 hours 01/14/19 1153 01/14/19 1154   01/14/19 1600  Ampicillin-Sulbactam  (UNASYN) 3 g in sodium chloride 0.9 % 100 mL IVPB  Status:  Discontinued     3 g 200 mL/hr over 30 Minutes Intravenous Every 24 hours 01/14/19 1154 01/19/19 2243          Medications  Scheduled Meds:  amiodarone  200 mg Oral Daily   amoxicillin-clavulanate  1 tablet Oral BID   aspirin  81 mg Oral Daily   atorvastatin  80 mg Oral q1800   calcitRIOL  0.5 mcg Oral Q T,Th,Sa-HD   calcium acetate  667 mg Oral TID WC   Chlorhexidine Gluconate Cloth  6 each Topical Q0600   feeding supplement (PRO-STAT SUGAR FREE 64)  30 mL Oral BID   gabapentin  300 mg Oral QHS   guaiFENesin  600 mg Oral BID   metoprolol succinate  25 mg Oral Daily   multivitamin  1 tablet Oral Daily   pantoprazole  40 mg Oral BID AC   predniSONE  20 mg Oral Q breakfast   sodium chloride flush  3 mL Intravenous Q12H   umeclidinium-vilanterol  1 puff Inhalation Daily   Continuous Infusions:  sodium chloride Stopped (01/17/19 1759)   sodium chloride     sodium chloride     PRN Meds:.sodium chloride, sodium chloride, sodium chloride, acetaminophen, albuterol, bismuth subsalicylate, diazepam, hydrALAZINE, HYDROcodone-acetaminophen, ipratropium, labetalol, levalbuterol, morphine injection, ondansetron (ZOFRAN) IV, sodium chloride flush      Subjective:   Larry Klein was seen and examined today.  Complaining of feeling weak, however refusing to go to skilled nursing facility.  Tolerating soft diet without difficulty. Patient denies dizziness, chest pain, shortness of breath, abdominal pain, N/V/D/C, new weakness, numbess, tingling. No acute events overnight.  Orthostatic vitals negative.  Objective:   Vitals:   01/19/19 1944 01/20/19 0223 01/20/19 0412 01/20/19 0811  BP: 102/65  114/84   Pulse: (!) 40  69   Resp: 20  20   Temp: 98.1 F (36.7 C)  97.6 F (36.4 C)   TempSrc: Oral  Oral   SpO2: 96%  99% 97%  Weight:  88.1 kg    Height:        Intake/Output Summary (Last 24 hours) at  01/20/2019 1013 Last data filed at 01/20/2019 0200 Gross per 24 hour  Intake 858.73 ml  Output 2700 ml  Net -1841.27 ml     Wt Readings from Last 3 Encounters:  01/20/19 88.1 kg  12/13/18 94.2 kg  11/15/18 92.8 kg     Exam  General: Alert and oriented x 3, NAD  Eyes:   HEENT:   Cardiovascular: S1 S2 auscultated. Regular rate and rhythm.  Respiratory: Clear to auscultation bilaterally, no wheezing, rales or rhonchi  Gastrointestinal: Soft, nontender, nondistended, + bowel sounds  Ext: Trace edema RLE, left leg soft tissue mass, left foot history of TMA many years ago  Neuro: No acute FND's  Musculoskeletal: No digital cyanosis, clubbing  Skin: No rashes  Psych: Normal affect and demeanor, alert and oriented x3    Data Reviewed:  I have personally reviewed following labs and imaging studies  Micro Results Recent Results (from the past 240 hour(s))  Surgical pcr screen     Status: None   Collection Time: 01/10/19  2:15 PM  Result Value Ref Range Status   MRSA, PCR NEGATIVE NEGATIVE Final   Staphylococcus aureus NEGATIVE NEGATIVE Final    Comment: (NOTE) The Xpert SA Assay (FDA approved for NASAL specimens in patients 27 years of age and older), is one component of a comprehensive surveillance program. It is not intended to diagnose infection nor to guide or monitor treatment. Performed at [facility], [place] [address] [place], [place] [address]   Expectorated sputum assessment w rflx to resp cult     Status: None   Collection Time: 01/17/19  9:22 AM  Result Value Ref Range Status   Specimen Description EXPECTORATED SPUTUM  Final   Special Requests NONE  Final   Sputum evaluation   Final    THIS SPECIMEN IS ACCEPTABLE FOR SPUTUM CULTURE Performed at [facility], [place] [address] [place], [place] [address]    Report Status 01/17/2019 FINAL  Final  Culture, respiratory     Status: None   Collection Time: 01/17/19  9:22 AM  Result Value Ref Range  Status   Specimen Description EXPECTORATED SPUTUM  Final   Special Requests NONE Reflexed from [id number]  Final   Gram Stain   Final    ABUNDANT WBC PRESENT, PREDOMINANTLY PMN GRAM NEGATIVE RODS Performed at [facility], [place] [address]  [place], [place] [address]    Culture MODERATE ESCHERICHIA COLI  Final   Report Status 01/20/2019 FINAL  Final   Organism ID, Bacteria ESCHERICHIA COLI  Final      Susceptibility   Escherichia coli - MIC*    AMPICILLIN >=32 RESISTANT Resistant     CEFAZOLIN <=4 SENSITIVE Sensitive     CEFEPIME <=1 SENSITIVE Sensitive     CEFTAZIDIME <=1 SENSITIVE Sensitive     CEFTRIAXONE <=1 SENSITIVE Sensitive     CIPROFLOXACIN >=4 RESISTANT Resistant     GENTAMICIN <=1 SENSITIVE Sensitive     IMIPENEM <=0.25 SENSITIVE Sensitive     TRIMETH/SULFA >=320 RESISTANT Resistant     AMPICILLIN/SULBACTAM 16 INTERMEDIATE Intermediate     PIP/TAZO <=4 SENSITIVE Sensitive     Extended ESBL NEGATIVE Sensitive     * MODERATE ESCHERICHIA COLI  Novel Coronavirus, NAA (hospital order; send-out to ref lab)     Status: None   Collection Time: 01/18/19  2:38 PM  Result Value Ref Range Status   SARS-CoV-2, NAA NOT DETECTED NOT DETECTED Final    Comment: (NOTE) This test was developed and its performance characteristics determined by [employer]. This test has not been FDA cleared or approved. This test has been authorized by FDA under an Emergency Use Authorization (EUA). This test is only authorized for the duration of time the declaration that circumstances exist justifying the authorization of the emergency use of in vitro diagnostic tests for detection of SARS-CoV-2 virus and/or diagnosis of COVID-19 infection under section 564(b)(1) of the Act, 21 U.S.C. 935TSV-7(B)(9), unless the authorization is terminated or revoked sooner. When diagnostic testing is negative, the possibility of a false negative result should be considered in the context of a patient's  recent exposures and the presence of clinical signs and symptoms consistent with COVID-19. An individual without symptoms of COVID-19 and who is not shedding SARS-CoV-2 virus would expect to have a negative (not detected) result in this assay. Performed  At: [facility] [address] [place], [place] [id number] Rush Farmer MD RA:[id number]    [place]  Final    Comment: Performed at [facility], [place] [address], [place], [place] [address]    Radiology Reports Ct Angio Ao+bifem W & Or Wo Contrast  Result Date: 12/28/2018 CLINICAL DATA:  Right foot ischemia EXAM: CT ANGIOGRAPHY OF ABDOMINAL AORTA WITH ILIOFEMORAL RUNOFF TECHNIQUE: Multidetector CT imaging of the abdomen, pelvis and lower extremities was performed using the standard protocol during bolus administration of intravenous contrast. Multiplanar CT image reconstructions and MIPs were obtained to evaluate the vascular anatomy. CONTRAST:  167mL OMNIPAQUE IOHEXOL 350 MG/ML SOLN COMPARISON:  None. FINDINGS: VASCULAR Aorta: Descending thoracic aorta is 4.2 cm in caliber. An aorto bi-iliac stent graft is in place excluding an aortic aneurysm. Maximal AP and transverse diameters are 6.2 and 5.5 cm. Aortic and iliac limbs are patent. There is no obvious endoleak. Pre contrast and venous phase images are not included in this study. Celiac: Patent. SMA: Mild smooth soft plaque along the wall of the proximal SMA with some atherosclerotic calcification at the origin. No significant narrowing. Renals: A dominant right upper pole renal artery is patent. A lower pole accessory right renal artery does not opacify with contrast likely due to exclusion by the stent graft. A single left renal artery is patent. IMA: Origin has been sacrificed. RIGHT Lower Extremity Inflow: Right common iliac landing zone is patent, distal and nonaneurysmal. There are atherosclerotic changes in the right internal  and external iliac  arteries without significant narrowing. Outflow: Right common femoral artery is patent with minimal atherosclerotic changes. Profunda femoral artery is patent. The right superficial femoral artery is mildly diffusely diseased but without significant focal narrowing. Runoff: The right popliteal artery is partially obscured by motion artifact. There is 50% narrowing above the knee joint due to smooth soft plaque on image 274 of series 6. There is grossly 2 vessel runoff via the right posterior tibial and peroneal arteries. LEFT Lower Extremity Inflow: Left common iliac landing zone is distal common nonaneurysmal, and patent. Left external iliac artery is patent with mild diffuse atherosclerotic changes. There is significant narrowing at the origin of the left internal iliac artery. Outflow: Left common femoral artery is patent and mildly disease. The profunda femoral artery is patent. The origin of the left superficial femoral artery is diminutive. This vessel is diffusely diminutive and moderately diseased without significant focal narrowing. Runoff: The popliteal artery is also obscured partially due to motion artifact. It is grossly patent. There is 1 vessel runoff via the left posterior tibial artery. Review of the MIP images confirms the above findings. NON-VASCULAR Lower chest: Small right pleural effusion. Subsegmental atelectasis at the dependent lung bases. Cardiomegaly. Hepatobiliary: Post cholecystectomy. There is a very small rim calcified area measuring less than 1 cm in the gallbladder fossa of unknown significance which may be related to postoperative changes or fat necrosis in surgical bed. Liver is otherwise within normal limits. Pancreas: Unremarkable Spleen: Unremarkable Adrenals/Urinary Tract: There is severe atrophy of both kidneys. There is a large calculus in the left kidney measuring 1.5 cm. There are hyperdense lesions in the lower pole of the left kidney which are multiple. The largest  measures 2.6 cm on image 73. It is associated with a rim calcification. This is a nonspecific finding and may represent a hyperdense complex cyst or an enhancing mass. The bladder contains hyperdense material representing either hemorrhage or contrast. Stomach/Bowel: No obvious mass in the colon. Small bowel is decompressed. Stomach contains enteric contents. Lymphatic: There is no abnormal retroperitoneal adenopathy. Reproductive: Normal prostate. Other: There is moderate free fluid within the pelvis. There is a very small amount of fluid anterior to the liver. Musculoskeletal: No vertebral compression deformity. Bilateral L5 pars defects with grade 1 spondylolisthesis measuring 7 mm. It is associated with severe degenerative disc disease at L5-S1. There is a large mass in the calf just deep to the Achilles tendon measuring 3.9 x 6.5 x 12.9 cm. It is somewhat heterogeneous and relatively low-density. Hounsfield unit measurements are well above simple fluid at 43. It is associated with calcifications. IMPRESSION: VASCULAR Motion artifact limits this study. The popliteal arteries were partially obscured but are grossly patent. Aorto bi-iliac stent graft is patent excluding an aortic aneurysm. There is no obvious endoleak. Maximal aneurysm sac diameter is 6.2 cm. There are no prior studies available to assess for interval growth. No significant narrowing in the right or left external iliac arteries. There is significant narrowing at the origin of the left internal iliac artery. Right common femoral and femoral arteries are patent with atherosclerotic disease. There is 50% narrowing in the right popliteal artery above the knee. Two vessel runoff to the right ankle. Left common femoral artery is patent. The left superficial femoral artery is diffusely moderately disease without significant focal narrowing. There is 1 vessel runoff to the left ankle via the posterior tibial artery. NON-VASCULAR Small right pleural  effusion.  Bibasilar atelectasis. Postcholecystectomy. Calcified area in the  gallbladder bed likely is associated with postoperative change. Left nephrolithiasis. There are hypodense lesions in the lower pole of the left kidney which are nonspecific. Differential diagnosis includes hyperdense cyst or enhancing mass. MRI may be helpful to further characterize. If MRI is not feasible, ultrasound may be helpful to characterize these areas as cystic. Hyperdense material in the bladder is nonspecific. Differential diagnosis includes excreted contrast or hemorrhage. Moderate free fluid in the pelvis. Very minimal fluid in the abdomen about the liver. Large mass within the left calf as described. Differential diagnosis includes nonspecific fluid, hemorrhage, or enhancing neoplasm. Ultrasound or MRI may be helpful. Electronically Signed   By: Marybelle Killings M.D.   On: 12/28/2018 07:47   US Renal  Result Date: 12/30/2018 CLINICAL DATA:  Possible mass on recent CT examination EXAM: RENAL / URINARY TRACT ULTRASOUND COMPLETE COMPARISON:  None. FINDINGS: Right Kidney: Renal measurements: 8.2 x 3.5 x 3.7 cm = volume: 57 mL. Diffuse cortical thinning is noted. No focal mass or hydronephrosis is noted. Left Kidney: Renal measurements: 10.1 x 4.7 x 3.3 cm = volume: 84 mL. Cortical thinning is noted. Triangular shaped calcification is noted in the lower pole measuring 1.3 cm in greatest dimension similar to that seen on prior CT examination. Cystic lesions are noted which correspond to the hyperdense lesion seen on prior CT examination. Peripheral calcifications are noted similar to that seen on prior CT examination. Bladder: Decompressed IMPRESSION: Atrophic kidneys bilaterally similar to that seen on prior CT. Lower pole nonobstructing stone on the left. Cystic lesions in the left kidney which correspond to the hyperdense lesions on prior CT. Electronically Signed   By: Inez Catalina M.D.   On: 12/30/2018 02:11   Mr Tibia Fibula  Left Wo Contrast  Result Date: 01/01/2019 CLINICAL DATA:  Mass-like fullness in the left lower leg. Bilateral foot erythema with pitting edema. Heterogeneous soft tissue mass on ultrasound. EXAM: MRI OF LOWER LEFT EXTREMITY WITHOUT CONTRAST TECHNIQUE: Multiplanar, multisequence MR imaging of the left lower leg was performed. No intravenous contrast was administered due to renal failure. COMPARISON:  Lower extremity ultrasound 12/29/2018. CT runoff 12/27/2018. FINDINGS: Bones/Joint/Cartilage Both lower legs are included on the coronal images. As seen on the prior studies, there is a large mass posteriorly in the distal left lower leg which is further described below. This mass erodes the posterior cortex of the distal tibia and fibula as correlated with CT. No adjacent bone marrow edema or pathologic fracture. There is no involvement the ankle joint which appears ankylosed at the distal tibiofibular joint as correlated with CT. The left knee appears unremarkable. No significant osseous findings in the visualized right lower leg. Ligaments Not relevant for exam/indication. Muscles and Tendons Mild fatty atrophy throughout the lower extremity musculature, left greater than right. There are extensive calcifications and thickening associated with the extensor tendons the left lower leg. No evidence of tendon rupture. Soft tissues As noted above, there is a large fairly well-circumscribed and lobulated soft tissue mass posteriorly in the distal left lower leg. This measures 14.3 x 4.6 x 6.8 cm and has heterogeneous T1 and T2 signal. Areas of intrinsic T1 shortening may reflect previous hemorrhage. There are small associated calcifications on the previous CT. This mass lies between the distal tibia and fibula and the Achilles tendon. There is cortical erosion of the distal tibia and fibula posteriorly. The mass is incompletely characterized without contrast. There is diffuse subcutaneous edema in both lower legs, left  greater than right, without focal  fluid collection. IMPRESSION: 1. Large soft tissue mass posteriorly in the distal left lower leg with associated cortical erosion of the distal tibia and fibula. The mass is heterogeneous in signal and incompletely characterized without contrast, although not typical of a benign hematoma. 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 Tissue sampling or excision biopsy recommended. 2. Underlying ankylosis of the distal tibiofibular joint with calcifications within the lower leg extensor tendons, likely posttraumatic. Asymmetric left lower leg muscular atrophy. 3. Subcutaneous edema in both lower legs, worse on the left. Electronically Signed   By: Richardean Sale M.D.   On: 01/01/2019 15:05   Dg Chest Port 1 View  Result Date: 01/14/2019 CLINICAL DATA:  Shortness of breath. EXAM: PORTABLE CHEST 1 VIEW COMPARISON:  Chest x-ray dated December 22, 2018. FINDINGS: Unchanged tunneled left internal jugular dialysis catheter with tips in the mid to distal SVC. Stable cardiomegaly status post CABG. Normal pulmonary vascularity. New patchy consolidation in the right upper lobe. No pleural effusion or pneumothorax. No acute osseous abnormality. IMPRESSION: 1. Right upper lobe pneumonia. Electronically Signed   By: Titus Dubin M.D.   On: 01/14/2019 13:11   Vas [place] Burnard Bunting With/wo Tbi  Result Date: 12/27/2018 LOWER EXTREMITY DOPPLER STUDY Indications: Pain, discoloration.  Performing Technologist: Maudry Mayhew MHA, RVT, RDCS, RDMS  Examination Guidelines: A complete evaluation includes at minimum, Doppler waveform signals and systolic blood pressure reading at the level of bilateral brachial, anterior tibial, and posterior tibial arteries, when vessel segments are accessible. Bilateral testing is considered an integral part of a complete examination. Photoelectric Plethysmograph (PPG) waveforms and toe systolic pressure readings are included as required and additional duplex  testing as needed. Limited examinations for reoccurring indications may be performed as noted.  ABI Findings: +---------+------------------+-----+----------+--------+  Right     Rt Pressure (mmHg) Index Waveform   Comment   +---------+------------------+-----+----------+--------+  Brachial  136                      triphasic            +---------+------------------+-----+----------+--------+  PTA       94                 0.69  monophasic           +---------+------------------+-----+----------+--------+  DP        98                 0.72  monophasic           +---------+------------------+-----+----------+--------+  Great Toe 36                 0.26                       +---------+------------------+-----+----------+--------+ +--------+------------------+-----+----------+-------+  Left     Lt Pressure (mmHg) Index Waveform   Comment  +--------+------------------+-----+----------+-------+  Brachial 125                      triphasic           +--------+------------------+-----+----------+-------+  PTA      91                 0.67  monophasic          +--------+------------------+-----+----------+-------+  DP       116                0.85  monophasic          +--------+------------------+-----+----------+-------+ +-------+-----------+---------------------+------------+---------------------+  ABI/TBI Today's ABI Today's TBI           Previous ABI Previous TBI           +-------+-----------+---------------------+------------+---------------------+  Right   0.72        0.26                  0.98         Not obtained           +-------+-----------+---------------------+------------+---------------------+  Left    0.85        Unable to be obtained 0.53         Unable to be obtained  +-------+-----------+---------------------+------------+---------------------+ Right ABIs appear decreased compared to prior study on 12/08/2018.  Summary: Right: Resting right ankle-brachial index indicates moderate right lower extremity  arterial disease. The right toe-brachial index is abnormal. Left: Resting left ankle-brachial index indicates mild left lower extremity arterial disease. Unable to obtain left great toe pressure due to transmetatarsal amputation x50 years post truck falling on his foot. Given anatomic distortion to the left foot and ankle, Doppler interrogation and results can be difficult to duplicate.  *See table(s) above for measurements and observations.  Electronically signed by Ruta Hinds MD on 12/27/2018 at 5:03:01 PM.   Final    [place] Palatine Soft Tissue Non Vascular  Result Date: 12/30/2018 CLINICAL DATA:  Possible left calf mass EXAM: ULTRASOUND LEFT LOWER EXTREMITY LIMITED TECHNIQUE: Ultrasound examination of the lower extremity soft tissues was performed in the area of clinical concern. COMPARISON:  12/27/2018 FINDINGS: Scanning in the area of clinical concern reveals a complex predominantly solid mass lesion measuring approximately 11.5 x 6.0 cm. This corresponds to that seen on recent CT examination. No significant vascularity is noted within. IMPRESSION: Soft tissue mass lesion as described. No significant vascularity is noted within. This may represent a posttraumatic hematoma. Correlation with the clinical history is recommended. MRI would be helpful for further characterization. Electronically Signed   By: Inez Catalina M.D.   On: 12/30/2018 02:06   Vas [place] Lower Extremity Venous (dvt)  Result Date: 12/27/2018  Lower Venous Study Indications: Pain, Swelling, and Erythema.  Performing Technologist: Maudry Mayhew MHA, RDMS, RVT, RDCS  Examination Guidelines: A complete evaluation includes B-mode imaging, spectral Doppler, color Doppler, and power Doppler as needed of all accessible portions of each vessel. Bilateral testing is considered an integral part of a complete examination. Limited examinations for reoccurring indications may be performed as noted.  Right Venous Findings:  +---------+---------------+---------+-----------+----------+--------------+            Compressibility Phasicity Spontaneity Properties Summary         +---------+---------------+---------+-----------+----------+--------------+  CFV       Full            No        Yes                    pulsatile flow  +---------+---------------+---------+-----------+----------+--------------+  SFJ       Full                                                             +---------+---------------+---------+-----------+----------+--------------+  FV Prox   Full                                                             +---------+---------------+---------+-----------+----------+--------------+  FV Mid    Full                                                             +---------+---------------+---------+-----------+----------+--------------+  FV Distal Full                                                             +---------+---------------+---------+-----------+----------+--------------+  PFV       Full                                                             +---------+---------------+---------+-----------+----------+--------------+  POP       Full            No        Yes                    pulsatile flow  +---------+---------------+---------+-----------+----------+--------------+  PTV       Full                                                             +---------+---------------+---------+-----------+----------+--------------+  PERO      Full                                                             +---------+---------------+---------+-----------+----------+--------------+  Left Venous Findings: +---+---------------+---------+-----------+----------+--------------+      Compressibility Phasicity Spontaneity Properties Summary         +---+---------------+---------+-----------+----------+--------------+  CFV Full            No        Yes                    pulsatile flow   +---+---------------+---------+-----------+----------+--------------+    Summary: Right: There is no evidence of deep vein thrombosis in the lower extremity. No cystic structure found in the popliteal fossa. Left: No evidence of common femoral vein obstruction.  *See table(s) above for measurements and observations. Electronically signed by Ruta Hinds MD on 12/27/2018 at 5:03:22 PM.    Final    Vas [place] Upper Ext Vein Mapping (pre-op Avf)  Result Date: 01/10/2019 UPPER EXTREMITY VEIN MAPPING  Indications: Pre-op dialysis access.  Examination Guidelines: A complete evaluation includes B-mode imaging, spectral Doppler, color Doppler, and power Doppler as needed of all accessible portions of each vessel. Bilateral testing is considered an integral part of a complete examination. Limited examinations for reoccurring indications may be performed as noted. +-----------------+-------------+----------+------------+  Right Cephalic    Diameter (cm) Depth (  cm)   Findings    +-----------------+-------------+----------+------------+  Prox upper arm        0.36         0.30                  +-----------------+-------------+----------+------------+  Mid upper arm         0.34         0.31                  +-----------------+-------------+----------+------------+  Dist upper arm        0.35         0.25                  +-----------------+-------------+----------+------------+  Antecubital fossa     0.38         0.27                  +-----------------+-------------+----------+------------+  Prox forearm                               IV, bandages  +-----------------+-------------+----------+------------+  Mid forearm           0.27         0.20                  +-----------------+-------------+----------+------------+  Wrist                 0.25         0.22                  +-----------------+-------------+----------+------------+ +-----------------+-------------+----------+--------+  Left Cephalic     Diameter (cm) Depth  (cm) Findings  +-----------------+-------------+----------+--------+  Prox upper arm        0.40         0.23              +-----------------+-------------+----------+--------+  Mid upper arm         0.37         0.24              +-----------------+-------------+----------+--------+  Dist upper arm        0.50         0.27              +-----------------+-------------+----------+--------+  Antecubital fossa     0.52         0.18              +-----------------+-------------+----------+--------+  Prox forearm          0.50         0.41              +-----------------+-------------+----------+--------+  Mid forearm           0.46         0.31              +-----------------+-------------+----------+--------+  Wrist                 0.42         0.24              +-----------------+-------------+----------+--------+ *See table(s) above for measurements and observations.  Diagnosing physician: Monica Martinez MD Electronically signed by Monica Martinez MD on 01/10/2019 at 11:56:32 AM.    Final    US Abdomen Limited Ruq  Result Date: 12/26/2018 CLINICAL DATA:  Abnormal liver function tests. EXAM:  ULTRASOUND ABDOMEN LIMITED RIGHT UPPER QUADRANT COMPARISON:  None. FINDINGS: Gallbladder: Status post cholecystectomy. Common bile duct: Diameter: 7 mm which is within normal limits for post cholecystectomy status. Liver: 8 mm left hepatic cyst is noted. Within normal limits in parenchymal echogenicity. Portal vein is patent on color Doppler imaging with normal direction of blood flow towards the liver. IMPRESSION: Status post cholecystectomy. Small left hepatic cyst. No other abnormality seen in the right upper quadrant of the abdomen. Electronically Signed   By: Marijo Conception, M.D.   On: 12/26/2018 22:27   Dg Esophagus W Single Cm (sol Or Thin Ba)  Result Date: 01/13/2019 CLINICAL DATA:  Inability to swallow secretions. EXAM: ESOPHOGRAM/BARIUM SWALLOW TECHNIQUE: Single contrast examination was performed using   thin barium. FLUOROSCOPY TIME:  Fluoroscopy Time:  1 minutes and 0 seconds. Radiation Exposure Index (if provided by the fluoroscopic device): 61.4 mGy Number of Acquired Spot Images: COMPARISON:  None. FINDINGS: Patient was placed LPO relative to the fluoro table at about 30 degrees head up. He took 1 swallow of thin barium which passed to the level of the distal esophagus where there is a complete mechanical obstruction. Despite several minutes of observation, no barium passed beyond the obstruction into the stomach. IMPRESSION: Complete mechanical obstruction in the distal third of the esophagus. Some images outline a protruding intraluminal defect that could be food lodged in the distal esophagus although soft tissue from obstructing neoplasm is also possible. Electronically Signed   By: Misty Stanley M.D.   On: 01/13/2019 16:15    Lab Data:  CBC: Recent Labs  Lab 01/15/19 0359 01/16/19 0250 01/17/19 9326 01/18/19 0543 01/19/19 0417 01/20/19 0446  WBC 15.6* 15.7* 12.8* 11.7* 12.8* 12.5*  NEUTROABS 14.6*  --   --   --   --   --   HGB 11.1* 11.1* 10.8* 11.0* 10.5* 11.0*  HCT 34.9* 34.9* 33.9* 33.9* 33.1* 35.1*  MCV 89.5 88.1 87.8 88.3 88.3 89.3  PLT 65* 93* 73* 86* 75* 79*   Basic Metabolic Panel: Recent Labs  Lab 01/15/19 0359 01/16/19 0250 01/19/19 0729  NA 138 137 138  K 5.1 4.9 4.6  CL 100 98 96*  CO2 20* 20* 22  GLUCOSE 121* 121* 135*  BUN 42* 61* 73*  CREATININE 5.47* 6.94* 8.46*  CALCIUM 8.5* 8.9 8.5*  PHOS  --   --  6.5*   GFR: Estimated Creatinine Clearance: 9.4 mL/min (A) (by C-G formula based on SCr of 8.46 mg/dL (H)). Liver Function Tests: Recent Labs  Lab 01/15/19 0359 01/19/19 0729  AST 23  --   ALT 10  --   ALKPHOS 93  --   BILITOT 1.2  --   PROT 5.6*  --   ALBUMIN 2.8* 2.7*   No results for input(s): LIPASE, AMYLASE in the last 168 hours. No results for input(s): AMMONIA in the last 168 hours. Coagulation Profile: No results for input(s): INR,  PROTIME in the last 168 hours. Cardiac Enzymes: No results for input(s): CKTOTAL, CKMB, CKMBINDEX, TROPONINI in the last 168 hours. BNP (last 3 results) No results for input(s): PROBNP in the last 8760 hours. HbA1C: No results for input(s): HGBA1C in the last 72 hours. CBG: Recent Labs  Lab 01/18/19 1617  GLUCAP 151*   Lipid Profile: No results for input(s): CHOL, HDL, LDLCALC, TRIG, CHOLHDL, LDLDIRECT in the last 72 hours. Thyroid Function Tests: No results for input(s): TSH, T4TOTAL, FREET4, T3FREE, THYROIDAB in the last 72 hours. Anemia Panel: No results  for input(s): VITAMINB12, FOLATE, FERRITIN, TIBC, IRON, RETICCTPCT in the last 72 hours. Urine analysis: No results found for: COLORURINE, APPEARANCEUR, LABSPEC, PHURINE, GLUCOSEU, HGBUR, BILIRUBINUR, KETONESUR, PROTEINUR, UROBILINOGEN, NITRITE, LEUKOCYTESUR   Jazsmine Macari M.D. [facility] 01/20/2019, 10:13 AM  Pager: [phone] Between 7am to 7pm - call Pager - [phone]  After 7pm go to [email or web address] - password [email or web address]  Call night coverage person covering after 7pm

## 2019-01-20 NOTE — Progress Notes (Signed)
Renal Navigator notified OP HD clinic/ of patient's negative COVID 19 result for continuity of care and safety.   Alphonzo Cruise Renal Navigator  [phone]

## 2019-01-20 NOTE — Care Management Important Message (Signed)
Important Message  Patient Details  Name: Larry Klein MRN: [id number] Date of Birth: 1955/10/20   Medicare Important Message Given:  Yes    Astou Lada 01/20/2019, 10:30 AM

## 2019-01-20 NOTE — Progress Notes (Addendum)
No SNF offers at this time; please see Inpt Rehab notes; Larry Klein at [facility] called to assess pt for placement; B Pennie Rushing [phone]  10:20 am - CM talked to Larry Klein at [facility] in [place], they will accept the patient. Bed offer given to the patient, he stated that he will think about it. Attending MD updated. B Arryanna Holquin RN,MHA,BSN  10:41 am- Patient is refusing SNF bed offer and stated that he wants to go home. Patient is upset stating " I am not ready to go home." no transportation home, CM can do ambulance transportation home if needed. Mindi Slicker RN,MHA,BSN

## 2019-01-20 NOTE — H&P (View-Only) (Signed)
[facility]                                                                              Patient Demographics  Larry Klein, is a 63 y.o. male, DOB - [birth year], BXU:[id number]  Admit date - 12/23/2018   Admitting Physician Vianne Bulls, MD  Outpatient Primary MD for the patient is Patient, No Pcp Per  Outpatient specialists:   LOS - 27  days   Medical records reviewed and are as summarized below:    No chief complaint on file.      Brief summary   Patient is a 63 year old male with CAD, COPD, hypertension, ESRD on dialysis presented from [facility] with chest pain, dyspnea on exertion, no fevers or chills.  Patient had undergone CT angiogram of the chest at [facility] which was negative for PE.  Troponins were positive, patient was started on a heparin drip.  COVID test was negative.  Patient was transferred to [facility]. Cardiology evaluated and did not feel candidate for surgical or percutaneous revascularization (prior CABG, thrombocytopenia), added Imdur and has signed off. Nephrology has been following for hemodialysis. Vascular surgery was consulted for right foot pain, hematology consulted for chronic thrombocytopenia. Patient was placed on a steroid trial with improvement on the platelet count. Patient underwent cardiac cath and now on medical management, also underwent lower extremity aortogram, and no role of revascularization Currently awaiting skilled nursing facility.  Assessment & Plan    Principal Problem:   Chest pain/unstable angina with history of CAD -Cardiology was consulted, underwent cardiac cath.  Plan to continue medical therapy: Aspirin, Lipitor, Imdur (held of 4/30 due to borderline BP) -Toprol decreased with holding parameters to 25 mg daily.  Cardiology has signed off.  Active Problems: Dysphagia/odynophagia: In the setting of chronic dysphagia and GERD - Patient complained of odynophagia and nausea and vomiting, also  has chronic dysphagia for last 3 years prior to admission. -Barium esophagogram showed complete obstruction of the distal esophagus. -Underwent urgent EGD on 4/29 with removal of large fibrous food bolus from the distal esophagus. -Per GI, will need repeat EGD in few weeks. -Continue soft diet  Aspiration pneumonia -Likely from #2, on 4/30, patient's BP was found to be soft, leukocytosis.  Chest x-ray showed right upper lung pneumonia, patient was started on Unasyn and transitioned to Augmentin for total 7 days. -Currently no wheezing, tolerating soft diet will need repeat EGD in few weeks.  Acute on chronic systolic CHF -EF 20 to 19% per echo on 3/24.  Volume management with hemodialysis -Appreciate cardiology and nephrology following.  Continue Toprol.  Needs follow-up echo in 3 months.  Permanent atrial fibrillation -Rate controlled, continue amiodarone -Not on anticoagulation due to thrombocytopenia, patient was to follow-up with hematology in [place], seen by hematology inpatient.   Mild transaminitis in the setting of CHF -LFTs stable, acute viral hepatitis panel negative   ESRD on hemodialysis TTS -Nephrology following, volume management with hemodialysis -Status post left UE AVF on 4/27  PAD status post aortic stent graft with right leg pain, swelling -Vascular surgery was consulted.  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  -Patient was placed on steroid trial since 4/14 -Platelets improving, steroids tapered to 20 mg daily.  Recommended slow taper as outpatient and follow-up with labs at [facility].  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  Mechanical falls -Patient refuses bed alarm, does not have any orthostasis, not witnessed.  Discussed with staff to document the falls.  Code Status: Full code DVT Prophylaxis: Not on SCDs due to leg pain, not on pharmacological anticoagulation due to thrombocytopenia Family Communication: Discussed in detail with the patient, all imaging results, lab results explained to the patient    Disposition Plan: Patient had skilled nursing facility bed offer today however he is refusing skilled nursing facility bed offer stating that he does not like the skilled nursing facility.  Now he wants to go home, but has no transportation for HD tomorrow.  Discussed with Dr. Augustin Coupe, nephrology who recommended DC home after first shift HD tomorrow a.m. Case manager will arrange ambulance transportation after HD.  Time Spent in minutes 35 minutes  Procedures:   Hemodialysis 4/27 LUE AVF  RIGHT/LEFT HEART CATH AND CORONARY/GRAFT ANGIOGRAPH  Prox RCA to Mid RCA lesion is 100% stenosed.  Ost LM to Mid LM lesion is 50% stenosed.  Ost Cx lesion is 95% stenosed.  Prox LAD lesion is 100% stenosed.  Ost 1st Diag to 1st Diag lesion is 70% stenosed.  Origin to Insertion lesion before 1st Diag is 100% stenoseD  Consultants:    Vascular surgery  Hematology  Nephrology  cardiology  Antimicrobials:   Anti-infectives (From admission, onward)   Start     Dose/Rate Route Frequency Ordered Stop   01/19/19 2245  amoxicillin-clavulanate (AUGMENTIN) 500-125 MG per tablet 500 mg     1 tablet Oral 2 times daily 01/19/19 2244 01/21/19 2059   01/14/19 1600  Ampicillin-Sulbactam (UNASYN) 3 g in sodium chloride 0.9 % 100 mL IVPB  Status:  Discontinued     3 g 200 mL/hr over 30 Minutes Intravenous Every 24 hours 01/14/19 1153 01/14/19 1154   01/14/19 1600  Ampicillin-Sulbactam  (UNASYN) 3 g in sodium chloride 0.9 % 100 mL IVPB  Status:  Discontinued     3 g 200 mL/hr over 30 Minutes Intravenous Every 24 hours 01/14/19 1154 01/19/19 2243          Medications  Scheduled Meds:  amiodarone  200 mg Oral Daily   amoxicillin-clavulanate  1 tablet Oral BID   aspirin  81 mg Oral Daily   atorvastatin  80 mg Oral q1800   calcitRIOL  0.5 mcg Oral Q T,Th,Sa-HD   calcium acetate  667 mg Oral TID WC   Chlorhexidine Gluconate Cloth  6 each Topical Q0600   feeding supplement (PRO-STAT SUGAR FREE 64)  30 mL Oral BID   gabapentin  300 mg Oral QHS   guaiFENesin  600 mg Oral BID   metoprolol succinate  25 mg Oral Daily   multivitamin  1 tablet Oral Daily   pantoprazole  40 mg Oral BID AC   predniSONE  20 mg Oral Q breakfast   sodium chloride flush  3 mL Intravenous Q12H   umeclidinium-vilanterol  1 puff Inhalation Daily   Continuous Infusions:  sodium chloride Stopped (01/17/19 1759)   sodium chloride     sodium chloride     PRN Meds:.sodium chloride, sodium chloride, sodium chloride, acetaminophen, albuterol, bismuth subsalicylate, diazepam, hydrALAZINE, HYDROcodone-acetaminophen, ipratropium, labetalol, levalbuterol, morphine injection, ondansetron (ZOFRAN) IV, sodium chloride flush      Subjective:   Larry Klein was seen and examined today.  Complaining of feeling weak, however refusing to go to skilled nursing facility.  Tolerating soft diet without difficulty. Patient denies dizziness, chest pain, shortness of breath, abdominal pain, N/V/D/C, new weakness, numbess, tingling. No acute events overnight.  Orthostatic vitals negative.  Objective:   Vitals:   01/19/19 1944 01/20/19 0223 01/20/19 0412 01/20/19 0811  BP: 102/65  114/84   Pulse: (!) 40  69   Resp: 20  20   Temp: 98.1 F (36.7 C)  97.6 F (36.4 C)   TempSrc: Oral  Oral   SpO2: 96%  99% 97%  Weight:  88.1 kg    Height:        Intake/Output Summary (Last 24 hours) at  01/20/2019 1013 Last data filed at 01/20/2019 0200 Gross per 24 hour  Intake 858.73 ml  Output 2700 ml  Net -1841.27 ml     Wt Readings from Last 3 Encounters:  01/20/19 88.1 kg  12/13/18 94.2 kg  11/15/18 92.8 kg     Exam  General: Alert and oriented x 3, NAD  Eyes:   HEENT:   Cardiovascular: S1 S2 auscultated. Regular rate and rhythm.  Respiratory: Clear to auscultation bilaterally, no wheezing, rales or rhonchi  Gastrointestinal: Soft, nontender, nondistended, + bowel sounds  Ext: Trace edema RLE, left leg soft tissue mass, left foot history of TMA many years ago  Neuro: No acute FND's  Musculoskeletal: No digital cyanosis, clubbing  Skin: No rashes  Psych: Normal affect and demeanor, alert and oriented x3    Data Reviewed:  I have personally reviewed following labs and imaging studies  Micro Results Recent Results (from the past 240 hour(s))  Surgical pcr screen     Status: None   Collection Time: 01/10/19  2:15 PM  Result Value Ref Range Status   MRSA, PCR NEGATIVE NEGATIVE Final   Staphylococcus aureus NEGATIVE NEGATIVE Final    Comment: (NOTE) The Xpert SA Assay (FDA approved for NASAL specimens in patients 80 years of age and older), is one component of a comprehensive surveillance program. It is not intended to diagnose infection nor to guide or monitor treatment. Performed at [facility], [place] [address] [place], [place] [address]   Expectorated sputum assessment w rflx to resp cult     Status: None   Collection Time: 01/17/19  9:22 AM  Result Value Ref Range Status   Specimen Description EXPECTORATED SPUTUM  Final   Special Requests NONE  Final   Sputum evaluation   Final    THIS SPECIMEN IS ACCEPTABLE FOR SPUTUM CULTURE Performed at [facility], [place] [address], [place], [place] [address]    Report Status 01/17/2019 FINAL  Final  Culture, respiratory     Status: None   Collection Time: 01/17/19  9:22 AM  Result Value Ref Range  Status   Specimen Description EXPECTORATED SPUTUM  Final   Special Requests NONE Reflexed from [id number]  Final   Gram Stain   Final    ABUNDANT WBC PRESENT, PREDOMINANTLY PMN GRAM NEGATIVE RODS Performed at [facility], [place] [address]  [place], [place] [address]    Culture MODERATE ESCHERICHIA COLI  Final   Report Status 01/20/2019 FINAL  Final   Organism ID, Bacteria ESCHERICHIA COLI  Final      Susceptibility   Escherichia coli - MIC*    AMPICILLIN >=32 RESISTANT Resistant     CEFAZOLIN <=4 SENSITIVE Sensitive     CEFEPIME <=1 SENSITIVE Sensitive     CEFTAZIDIME <=1 SENSITIVE Sensitive     CEFTRIAXONE <=1 SENSITIVE Sensitive     CIPROFLOXACIN >=4 RESISTANT Resistant     GENTAMICIN <=1 SENSITIVE Sensitive     IMIPENEM <=0.25 SENSITIVE Sensitive     TRIMETH/SULFA >=320 RESISTANT Resistant     AMPICILLIN/SULBACTAM 16 INTERMEDIATE Intermediate     PIP/TAZO <=4 SENSITIVE Sensitive     Extended ESBL NEGATIVE Sensitive     * MODERATE ESCHERICHIA COLI  Novel Coronavirus, NAA (hospital order; send-out to ref lab)     Status: None   Collection Time: 01/18/19  2:38 PM  Result Value Ref Range Status   SARS-CoV-2, NAA NOT DETECTED NOT DETECTED Final    Comment: (NOTE) This test was developed and its performance characteristics determined by [employer]. This test has not been FDA cleared or approved. This test has been authorized by FDA under an Emergency Use Authorization (EUA). This test is only authorized for the duration of time the declaration that circumstances exist justifying the authorization of the emergency use of in vitro diagnostic tests for detection of SARS-CoV-2 virus and/or diagnosis of COVID-19 infection under section 564(b)(1) of the Act, 21 U.S.C. 604VWU-9(W)(1), unless the authorization is terminated or revoked sooner. When diagnostic testing is negative, the possibility of a false negative result should be considered in the context of a patient's  recent exposures and the presence of clinical signs and symptoms consistent with COVID-19. An individual without symptoms of COVID-19 and who is not shedding SARS-CoV-2 virus would expect to have a negative (not detected) result in this assay. Performed  At: [facility] [address] [place], [place] [id number] Rush Farmer MD AO:[id number]    [place]  Final    Comment: Performed at [facility], [place] [address] [place], [place] [address]    Radiology Reports Ct Angio Ao+bifem W & Or Wo Contrast  Result Date: 12/28/2018 CLINICAL DATA:  Right foot ischemia EXAM: CT ANGIOGRAPHY OF ABDOMINAL AORTA WITH ILIOFEMORAL RUNOFF TECHNIQUE: Multidetector CT imaging of the abdomen, pelvis and lower extremities was performed using the standard protocol during bolus administration of intravenous contrast. Multiplanar CT image reconstructions and MIPs were obtained to evaluate the vascular anatomy. CONTRAST:  167mL OMNIPAQUE IOHEXOL 350 MG/ML SOLN COMPARISON:  None. FINDINGS: VASCULAR Aorta: Descending thoracic aorta is 4.2 cm in caliber. An aorto bi-iliac stent graft is in place excluding an aortic aneurysm. Maximal AP and transverse diameters are 6.2 and 5.5 cm. Aortic and iliac limbs are patent. There is no obvious endoleak. Pre contrast and venous phase images are not included in this study. Celiac: Patent. SMA: Mild smooth soft plaque along the wall of the proximal SMA with some atherosclerotic calcification at the origin. No significant narrowing. Renals: A dominant right upper pole renal artery is patent. A lower pole accessory right renal artery does not opacify with contrast likely due to exclusion by the stent graft. A single left renal artery is patent. IMA: Origin has been sacrificed. RIGHT Lower Extremity Inflow: Right common iliac landing zone is patent, distal and nonaneurysmal. There are atherosclerotic changes in the right internal  and external iliac  arteries without significant narrowing. Outflow: Right common femoral artery is patent with minimal atherosclerotic changes. Profunda femoral artery is patent. The right superficial femoral artery is mildly diffusely diseased but without significant focal narrowing. Runoff: The right popliteal artery is partially obscured by motion artifact. There is 50% narrowing above the knee joint due to smooth soft plaque on image 274 of series 6. There is grossly 2 vessel runoff via the right posterior tibial and peroneal arteries. LEFT Lower Extremity Inflow: Left common iliac landing zone is distal common nonaneurysmal, and patent. Left external iliac artery is patent with mild diffuse atherosclerotic changes. There is significant narrowing at the origin of the left internal iliac artery. Outflow: Left common femoral artery is patent and mildly disease. The profunda femoral artery is patent. The origin of the left superficial femoral artery is diminutive. This vessel is diffusely diminutive and moderately diseased without significant focal narrowing. Runoff: The popliteal artery is also obscured partially due to motion artifact. It is grossly patent. There is 1 vessel runoff via the left posterior tibial artery. Review of the MIP images confirms the above findings. NON-VASCULAR Lower chest: Small right pleural effusion. Subsegmental atelectasis at the dependent lung bases. Cardiomegaly. Hepatobiliary: Post cholecystectomy. There is a very small rim calcified area measuring less than 1 cm in the gallbladder fossa of unknown significance which may be related to postoperative changes or fat necrosis in surgical bed. Liver is otherwise within normal limits. Pancreas: Unremarkable Spleen: Unremarkable Adrenals/Urinary Tract: There is severe atrophy of both kidneys. There is a large calculus in the left kidney measuring 1.5 cm. There are hyperdense lesions in the lower pole of the left kidney which are multiple. The largest  measures 2.6 cm on image 73. It is associated with a rim calcification. This is a nonspecific finding and may represent a hyperdense complex cyst or an enhancing mass. The bladder contains hyperdense material representing either hemorrhage or contrast. Stomach/Bowel: No obvious mass in the colon. Small bowel is decompressed. Stomach contains enteric contents. Lymphatic: There is no abnormal retroperitoneal adenopathy. Reproductive: Normal prostate. Other: There is moderate free fluid within the pelvis. There is a very small amount of fluid anterior to the liver. Musculoskeletal: No vertebral compression deformity. Bilateral L5 pars defects with grade 1 spondylolisthesis measuring 7 mm. It is associated with severe degenerative disc disease at L5-S1. There is a large mass in the calf just deep to the Achilles tendon measuring 3.9 x 6.5 x 12.9 cm. It is somewhat heterogeneous and relatively low-density. Hounsfield unit measurements are well above simple fluid at 43. It is associated with calcifications. IMPRESSION: VASCULAR Motion artifact limits this study. The popliteal arteries were partially obscured but are grossly patent. Aorto bi-iliac stent graft is patent excluding an aortic aneurysm. There is no obvious endoleak. Maximal aneurysm sac diameter is 6.2 cm. There are no prior studies available to assess for interval growth. No significant narrowing in the right or left external iliac arteries. There is significant narrowing at the origin of the left internal iliac artery. Right common femoral and femoral arteries are patent with atherosclerotic disease. There is 50% narrowing in the right popliteal artery above the knee. Two vessel runoff to the right ankle. Left common femoral artery is patent. The left superficial femoral artery is diffusely moderately disease without significant focal narrowing. There is 1 vessel runoff to the left ankle via the posterior tibial artery. NON-VASCULAR Small right pleural  effusion.  Bibasilar atelectasis. Postcholecystectomy. Calcified area in the  gallbladder bed likely is associated with postoperative change. Left nephrolithiasis. There are hypodense lesions in the lower pole of the left kidney which are nonspecific. Differential diagnosis includes hyperdense cyst or enhancing mass. MRI may be helpful to further characterize. If MRI is not feasible, ultrasound may be helpful to characterize these areas as cystic. Hyperdense material in the bladder is nonspecific. Differential diagnosis includes excreted contrast or hemorrhage. Moderate free fluid in the pelvis. Very minimal fluid in the abdomen about the liver. Large mass within the left calf as described. Differential diagnosis includes nonspecific fluid, hemorrhage, or enhancing neoplasm. Ultrasound or MRI may be helpful. Electronically Signed   By: Marybelle Killings M.D.   On: 12/28/2018 07:47   US Renal  Result Date: 12/30/2018 CLINICAL DATA:  Possible mass on recent CT examination EXAM: RENAL / URINARY TRACT ULTRASOUND COMPLETE COMPARISON:  None. FINDINGS: Right Kidney: Renal measurements: 8.2 x 3.5 x 3.7 cm = volume: 57 mL. Diffuse cortical thinning is noted. No focal mass or hydronephrosis is noted. Left Kidney: Renal measurements: 10.1 x 4.7 x 3.3 cm = volume: 84 mL. Cortical thinning is noted. Triangular shaped calcification is noted in the lower pole measuring 1.3 cm in greatest dimension similar to that seen on prior CT examination. Cystic lesions are noted which correspond to the hyperdense lesion seen on prior CT examination. Peripheral calcifications are noted similar to that seen on prior CT examination. Bladder: Decompressed IMPRESSION: Atrophic kidneys bilaterally similar to that seen on prior CT. Lower pole nonobstructing stone on the left. Cystic lesions in the left kidney which correspond to the hyperdense lesions on prior CT. Electronically Signed   By: Inez Catalina M.D.   On: 12/30/2018 02:11   Mr Tibia Fibula  Left Wo Contrast  Result Date: 01/01/2019 CLINICAL DATA:  Mass-like fullness in the left lower leg. Bilateral foot erythema with pitting edema. Heterogeneous soft tissue mass on ultrasound. EXAM: MRI OF LOWER LEFT EXTREMITY WITHOUT CONTRAST TECHNIQUE: Multiplanar, multisequence MR imaging of the left lower leg was performed. No intravenous contrast was administered due to renal failure. COMPARISON:  Lower extremity ultrasound 12/29/2018. CT runoff 12/27/2018. FINDINGS: Bones/Joint/Cartilage Both lower legs are included on the coronal images. As seen on the prior studies, there is a large mass posteriorly in the distal left lower leg which is further described below. This mass erodes the posterior cortex of the distal tibia and fibula as correlated with CT. No adjacent bone marrow edema or pathologic fracture. There is no involvement the ankle joint which appears ankylosed at the distal tibiofibular joint as correlated with CT. The left knee appears unremarkable. No significant osseous findings in the visualized right lower leg. Ligaments Not relevant for exam/indication. Muscles and Tendons Mild fatty atrophy throughout the lower extremity musculature, left greater than right. There are extensive calcifications and thickening associated with the extensor tendons the left lower leg. No evidence of tendon rupture. Soft tissues As noted above, there is a large fairly well-circumscribed and lobulated soft tissue mass posteriorly in the distal left lower leg. This measures 14.3 x 4.6 x 6.8 cm and has heterogeneous T1 and T2 signal. Areas of intrinsic T1 shortening may reflect previous hemorrhage. There are small associated calcifications on the previous CT. This mass lies between the distal tibia and fibula and the Achilles tendon. There is cortical erosion of the distal tibia and fibula posteriorly. The mass is incompletely characterized without contrast. There is diffuse subcutaneous edema in both lower legs, left  greater than right, without focal  fluid collection. IMPRESSION: 1. Large soft tissue mass posteriorly in the distal left lower leg with associated cortical erosion of the distal tibia and fibula. The mass is heterogeneous in signal and incompletely characterized without contrast, although not typical of a benign hematoma. 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 Tissue sampling or excision biopsy recommended. 2. Underlying ankylosis of the distal tibiofibular joint with calcifications within the lower leg extensor tendons, likely posttraumatic. Asymmetric left lower leg muscular atrophy. 3. Subcutaneous edema in both lower legs, worse on the left. Electronically Signed   By: Richardean Sale M.D.   On: 01/01/2019 15:05   Dg Chest Port 1 View  Result Date: 01/14/2019 CLINICAL DATA:  Shortness of breath. EXAM: PORTABLE CHEST 1 VIEW COMPARISON:  Chest x-ray dated December 22, 2018. FINDINGS: Unchanged tunneled left internal jugular dialysis catheter with tips in the mid to distal SVC. Stable cardiomegaly status post CABG. Normal pulmonary vascularity. New patchy consolidation in the right upper lobe. No pleural effusion or pneumothorax. No acute osseous abnormality. IMPRESSION: 1. Right upper lobe pneumonia. Electronically Signed   By: Titus Dubin M.D.   On: 01/14/2019 13:11   Vas [place] Burnard Bunting With/wo Tbi  Result Date: 12/27/2018 LOWER EXTREMITY DOPPLER STUDY Indications: Pain, discoloration.  Performing Technologist: Maudry Mayhew MHA, RVT, RDCS, RDMS  Examination Guidelines: A complete evaluation includes at minimum, Doppler waveform signals and systolic blood pressure reading at the level of bilateral brachial, anterior tibial, and posterior tibial arteries, when vessel segments are accessible. Bilateral testing is considered an integral part of a complete examination. Photoelectric Plethysmograph (PPG) waveforms and toe systolic pressure readings are included as required and additional duplex  testing as needed. Limited examinations for reoccurring indications may be performed as noted.  ABI Findings: +---------+------------------+-----+----------+--------+  Right     Rt Pressure (mmHg) Index Waveform   Comment   +---------+------------------+-----+----------+--------+  Brachial  136                      triphasic            +---------+------------------+-----+----------+--------+  PTA       94                 0.69  monophasic           +---------+------------------+-----+----------+--------+  DP        98                 0.72  monophasic           +---------+------------------+-----+----------+--------+  Great Toe 36                 0.26                       +---------+------------------+-----+----------+--------+ +--------+------------------+-----+----------+-------+  Left     Lt Pressure (mmHg) Index Waveform   Comment  +--------+------------------+-----+----------+-------+  Brachial 125                      triphasic           +--------+------------------+-----+----------+-------+  PTA      91                 0.67  monophasic          +--------+------------------+-----+----------+-------+  DP       116                0.85  monophasic          +--------+------------------+-----+----------+-------+ +-------+-----------+---------------------+------------+---------------------+  ABI/TBI Today's ABI Today's TBI           Previous ABI Previous TBI           +-------+-----------+---------------------+------------+---------------------+  Right   0.72        0.26                  0.98         Not obtained           +-------+-----------+---------------------+------------+---------------------+  Left    0.85        Unable to be obtained 0.53         Unable to be obtained  +-------+-----------+---------------------+------------+---------------------+ Right ABIs appear decreased compared to prior study on 12/08/2018.  Summary: Right: Resting right ankle-brachial index indicates moderate right lower extremity  arterial disease. The right toe-brachial index is abnormal. Left: Resting left ankle-brachial index indicates mild left lower extremity arterial disease. Unable to obtain left great toe pressure due to transmetatarsal amputation x50 years post truck falling on his foot. Given anatomic distortion to the left foot and ankle, Doppler interrogation and results can be difficult to duplicate.  *See table(s) above for measurements and observations.  Electronically signed by Ruta Hinds MD on 12/27/2018 at 5:03:01 PM.   Final    [place] Soft Tissue Non Vascular  Result Date: 12/30/2018 CLINICAL DATA:  Possible left calf mass EXAM: ULTRASOUND LEFT LOWER EXTREMITY LIMITED TECHNIQUE: Ultrasound examination of the lower extremity soft tissues was performed in the area of clinical concern. COMPARISON:  12/27/2018 FINDINGS: Scanning in the area of clinical concern reveals a complex predominantly solid mass lesion measuring approximately 11.5 x 6.0 cm. This corresponds to that seen on recent CT examination. No significant vascularity is noted within. IMPRESSION: Soft tissue mass lesion as described. No significant vascularity is noted within. This may represent a posttraumatic hematoma. Correlation with the clinical history is recommended. MRI would be helpful for further characterization. Electronically Signed   By: Inez Catalina M.D.   On: 12/30/2018 02:06   Vas [place] Lower Extremity Venous (dvt)  Result Date: 12/27/2018  Lower Venous Study Indications: Pain, Swelling, and Erythema.  Performing Technologist: Maudry Mayhew MHA, RDMS, RVT, RDCS  Examination Guidelines: A complete evaluation includes B-mode imaging, spectral Doppler, color Doppler, and power Doppler as needed of all accessible portions of each vessel. Bilateral testing is considered an integral part of a complete examination. Limited examinations for reoccurring indications may be performed as noted.  Right Venous Findings:  +---------+---------------+---------+-----------+----------+--------------+            Compressibility Phasicity Spontaneity Properties Summary         +---------+---------------+---------+-----------+----------+--------------+  CFV       Full            No        Yes                    pulsatile flow  +---------+---------------+---------+-----------+----------+--------------+  SFJ       Full                                                             +---------+---------------+---------+-----------+----------+--------------+  FV Prox   Full                                                             +---------+---------------+---------+-----------+----------+--------------+  FV Mid    Full                                                             +---------+---------------+---------+-----------+----------+--------------+  FV Distal Full                                                             +---------+---------------+---------+-----------+----------+--------------+  PFV       Full                                                             +---------+---------------+---------+-----------+----------+--------------+  POP       Full            No        Yes                    pulsatile flow  +---------+---------------+---------+-----------+----------+--------------+  PTV       Full                                                             +---------+---------------+---------+-----------+----------+--------------+  PERO      Full                                                             +---------+---------------+---------+-----------+----------+--------------+  Left Venous Findings: +---+---------------+---------+-----------+----------+--------------+      Compressibility Phasicity Spontaneity Properties Summary         +---+---------------+---------+-----------+----------+--------------+  CFV Full            No        Yes                    pulsatile flow   +---+---------------+---------+-----------+----------+--------------+    Summary: Right: There is no evidence of deep vein thrombosis in the lower extremity. No cystic structure found in the popliteal fossa. Left: No evidence of common femoral vein obstruction.  *See table(s) above for measurements and observations. Electronically signed by Ruta Hinds MD on 12/27/2018 at 5:03:22 PM.    Final    Vas [place] Upper Ext Vein Mapping (pre-op Avf)  Result Date: 01/10/2019 UPPER EXTREMITY VEIN MAPPING  Indications: Pre-op dialysis access.  Examination Guidelines: A complete evaluation includes B-mode imaging, spectral Doppler, color Doppler, and power Doppler as needed of all accessible portions of each vessel. Bilateral testing is considered an integral part of a complete examination. Limited examinations for reoccurring indications may be performed as noted. +-----------------+-------------+----------+------------+  Right Cephalic    Diameter (cm) Depth (  cm)   Findings    +-----------------+-------------+----------+------------+  Prox upper arm        0.36         0.30                  +-----------------+-------------+----------+------------+  Mid upper arm         0.34         0.31                  +-----------------+-------------+----------+------------+  Dist upper arm        0.35         0.25                  +-----------------+-------------+----------+------------+  Antecubital fossa     0.38         0.27                  +-----------------+-------------+----------+------------+  Prox forearm                               IV, bandages  +-----------------+-------------+----------+------------+  Mid forearm           0.27         0.20                  +-----------------+-------------+----------+------------+  Wrist                 0.25         0.22                  +-----------------+-------------+----------+------------+ +-----------------+-------------+----------+--------+  Left Cephalic     Diameter (cm) Depth  (cm) Findings  +-----------------+-------------+----------+--------+  Prox upper arm        0.40         0.23              +-----------------+-------------+----------+--------+  Mid upper arm         0.37         0.24              +-----------------+-------------+----------+--------+  Dist upper arm        0.50         0.27              +-----------------+-------------+----------+--------+  Antecubital fossa     0.52         0.18              +-----------------+-------------+----------+--------+  Prox forearm          0.50         0.41              +-----------------+-------------+----------+--------+  Mid forearm           0.46         0.31              +-----------------+-------------+----------+--------+  Wrist                 0.42         0.24              +-----------------+-------------+----------+--------+ *See table(s) above for measurements and observations.  Diagnosing physician: Monica Martinez MD Electronically signed by Monica Martinez MD on 01/10/2019 at 11:56:32 AM.    Final    US Abdomen Limited Ruq  Result Date: 12/26/2018 CLINICAL DATA:  Abnormal liver function tests. EXAM:  ULTRASOUND ABDOMEN LIMITED RIGHT UPPER QUADRANT COMPARISON:  None. FINDINGS: Gallbladder: Status post cholecystectomy. Common bile duct: Diameter: 7 mm which is within normal limits for post cholecystectomy status. Liver: 8 mm left hepatic cyst is noted. Within normal limits in parenchymal echogenicity. Portal vein is patent on color Doppler imaging with normal direction of blood flow towards the liver. IMPRESSION: Status post cholecystectomy. Small left hepatic cyst. No other abnormality seen in the right upper quadrant of the abdomen. Electronically Signed   By: Marijo Conception, M.D.   On: 12/26/2018 22:27   Dg Esophagus W Single Cm (sol Or Thin Ba)  Result Date: 01/13/2019 CLINICAL DATA:  Inability to swallow secretions. EXAM: ESOPHOGRAM/BARIUM SWALLOW TECHNIQUE: Single contrast examination was performed using   thin barium. FLUOROSCOPY TIME:  Fluoroscopy Time:  1 minutes and 0 seconds. Radiation Exposure Index (if provided by the fluoroscopic device): 61.4 mGy Number of Acquired Spot Images: COMPARISON:  None. FINDINGS: Patient was placed LPO relative to the fluoro table at about 30 degrees head up. He took 1 swallow of thin barium which passed to the level of the distal esophagus where there is a complete mechanical obstruction. Despite several minutes of observation, no barium passed beyond the obstruction into the stomach. IMPRESSION: Complete mechanical obstruction in the distal third of the esophagus. Some images outline a protruding intraluminal defect that could be food lodged in the distal esophagus although soft tissue from obstructing neoplasm is also possible. Electronically Signed   By: Misty Stanley M.D.   On: 01/13/2019 16:15    Lab Data:  CBC: Recent Labs  Lab 01/15/19 0359 01/16/19 0250 01/17/19 9381 01/18/19 0543 01/19/19 0417 01/20/19 0446  WBC 15.6* 15.7* 12.8* 11.7* 12.8* 12.5*  NEUTROABS 14.6*  --   --   --   --   --   HGB 11.1* 11.1* 10.8* 11.0* 10.5* 11.0*  HCT 34.9* 34.9* 33.9* 33.9* 33.1* 35.1*  MCV 89.5 88.1 87.8 88.3 88.3 89.3  PLT 65* 93* 73* 86* 75* 79*   Basic Metabolic Panel: Recent Labs  Lab 01/15/19 0359 01/16/19 0250 01/19/19 0729  NA 138 137 138  K 5.1 4.9 4.6  CL 100 98 96*  CO2 20* 20* 22  GLUCOSE 121* 121* 135*  BUN 42* 61* 73*  CREATININE 5.47* 6.94* 8.46*  CALCIUM 8.5* 8.9 8.5*  PHOS  --   --  6.5*   GFR: Estimated Creatinine Clearance: 9.4 mL/min (A) (by C-G formula based on SCr of 8.46 mg/dL (H)). Liver Function Tests: Recent Labs  Lab 01/15/19 0359 01/19/19 0729  AST 23  --   ALT 10  --   ALKPHOS 93  --   BILITOT 1.2  --   PROT 5.6*  --   ALBUMIN 2.8* 2.7*   No results for input(s): LIPASE, AMYLASE in the last 168 hours. No results for input(s): AMMONIA in the last 168 hours. Coagulation Profile: No results for input(s): INR,  PROTIME in the last 168 hours. Cardiac Enzymes: No results for input(s): CKTOTAL, CKMB, CKMBINDEX, TROPONINI in the last 168 hours. BNP (last 3 results) No results for input(s): PROBNP in the last 8760 hours. HbA1C: No results for input(s): HGBA1C in the last 72 hours. CBG: Recent Labs  Lab 01/18/19 1617  GLUCAP 151*   Lipid Profile: No results for input(s): CHOL, HDL, LDLCALC, TRIG, CHOLHDL, LDLDIRECT in the last 72 hours. Thyroid Function Tests: No results for input(s): TSH, T4TOTAL, FREET4, T3FREE, THYROIDAB in the last 72 hours. Anemia Panel: No results  for input(s): VITAMINB12, FOLATE, FERRITIN, TIBC, IRON, RETICCTPCT in the last 72 hours. Urine analysis: No results found for: COLORURINE, APPEARANCEUR, LABSPEC, PHURINE, GLUCOSEU, HGBUR, BILIRUBINUR, KETONESUR, PROTEINUR, UROBILINOGEN, NITRITE, LEUKOCYTESUR   Analeise Mccleery M.D. [facility] 01/20/2019, 10:13 AM  Pager: [phone] Between 7am to 7pm - call Pager - [phone]  After 7pm go to [email or web address] - password [email or web address]  Call night coverage person covering after 7pm

## 2019-01-20 NOTE — Progress Notes (Signed)
OT Cancellation Note  Patient Details Name: Larry Klein MRN: [id number] DOB: 1956-03-05   Cancelled Treatment:    Reason Eval/Treat Not Completed: Patient declined, no reason specified  [place] 01/20/2019, 3:55 PM  Lesle Chris, OTR/L [facility] Services [phone] [facility] pager [phone] office 01/20/2019

## 2019-01-20 NOTE — Telephone Encounter (Signed)
-----   Message from Gabriel Earing, [place] sent at 01/11/2019  4:54 PM EDT ----- S/p left BC AVF 01/11/2019.  F/u with Dr. Oneida Alar in 5-6 weeks with duplex.  Thanks

## 2019-01-21 ENCOUNTER — Other Ambulatory Visit: Payer: Self-pay

## 2019-01-21 ENCOUNTER — Telehealth: Payer: Self-pay | Admitting: Cardiology

## 2019-01-21 LAB — CBC
HCT: 34.1 % — ABNORMAL LOW (ref 39.0–52.0)
Hemoglobin: 10.6 g/dL — ABNORMAL LOW (ref 13.0–17.0)
MCH: 27.7 pg (ref 26.0–34.0)
MCHC: 31.1 g/dL (ref 30.0–36.0)
MCV: 89.3 fL (ref 80.0–100.0)
Platelets: 85 10*3/uL — ABNORMAL LOW (ref 150–400)
RBC: 3.82 MIL/uL — ABNORMAL LOW (ref 4.22–5.81)
RDW: 19.1 % — ABNORMAL HIGH (ref 11.5–15.5)
WBC: 15.4 10*3/uL — ABNORMAL HIGH (ref 4.0–10.5)
nRBC: 0.1 % (ref 0.0–0.2)

## 2019-01-21 LAB — RENAL FUNCTION PANEL
Albumin: 2.6 g/dL — ABNORMAL LOW (ref 3.5–5.0)
Anion gap: 15 (ref 5–15)
BUN: 65 mg/dL — ABNORMAL HIGH (ref 8–23)
CO2: 22 mmol/L (ref 22–32)
Calcium: 8.5 mg/dL — ABNORMAL LOW (ref 8.9–10.3)
Chloride: 100 mmol/L (ref 98–111)
Creatinine, Ser: 7.47 mg/dL — ABNORMAL HIGH (ref 0.61–1.24)
GFR calc Af Amer: 8 mL/min — ABNORMAL LOW (ref >60)
GFR calc non Af Amer: 7 mL/min — ABNORMAL LOW (ref >60)
Glucose, Bld: 133 mg/dL — ABNORMAL HIGH (ref 70–99)
Phosphorus: 4.8 mg/dL — ABNORMAL HIGH (ref 2.5–4.6)
Potassium: 4.5 mmol/L (ref 3.5–5.1)
Sodium: 137 mmol/L (ref 135–145)

## 2019-01-21 LAB — HEPATITIS B SURFACE ANTIGEN: Hepatitis B Surface Ag: NEGATIVE

## 2019-01-21 MED ORDER — HEPARIN SODIUM (PORCINE) 1000 UNIT/ML IJ SOLN
INTRAMUSCULAR | Status: AC
  Filled 2019-01-21: qty 4

## 2019-01-21 MED ORDER — IPRATROPIUM-ALBUTEROL 0.5-2.5 (3) MG/3ML IN SOLN: 3.0000 mL | Freq: Four times a day (QID) | RESPIRATORY_TRACT | 11 refills | Status: AC | PRN

## 2019-01-21 MED ORDER — CEFPODOXIME PROXETIL 200 MG PO TABS: ORAL_TABLET | ORAL | 0 refills | Status: AC

## 2019-01-21 MED ORDER — CALCITRIOL 0.5 MCG PO CAPS
ORAL_CAPSULE | ORAL | Status: AC
  Administered 2019-01-21: 0.5 ug via ORAL
  Filled 2019-01-21: qty 1

## 2019-01-21 NOTE — Telephone Encounter (Signed)
Virtual Visit Pre-Appointment Phone Call  "(Name), I am calling you today to discuss your upcoming appointment. We are currently trying to limit exposure to the virus that causes COVID-19 by seeing patients at home rather than in the office."  1. "What is the BEST phone number to call the day of the visit?" - include this in appointment notes  2. Do you have or have access to (through a family member/friend) a smartphone with video capability that we can use for your visit?" a. If yes - list this number in appt notes as cell (if different from BEST phone #) and list the appointment type as a VIDEO visit in appointment notes b. If no - list the appointment type as a PHONE visit in appointment notes  3. Confirm consent - "In the setting of the current Covid19 crisis, you are scheduled for a (phone or video) visit with your provider on (date) at (time).  Just as we do with many in-office visits, in order for you to participate in this visit, we must obtain consent.  If you'd like, I can send this to your mychart (if signed up) or email for you to review.  Otherwise, I can obtain your verbal consent now.  All virtual visits are billed to your insurance company just like a normal visit would be.  By agreeing to a virtual visit, we'd like you to understand that the technology does not allow for your provider to perform an examination, and thus may limit your provider's ability to fully assess your condition. If your provider identifies any concerns that need to be evaluated in person, we will make arrangements to do so.  Finally, though the technology is pretty good, we cannot assure that it will always work on either your or our end, and in the setting of a video visit, we may have to convert it to a phone-only visit.  In either situation, we cannot ensure that we have a secure connection.  Are you willing to proceed?" STAFF: Did the patient verbally acknowledge consent to telehealth visit? Document  YES/NO here: Yes, nurse with patient  4. Advise patient to be prepared - "Two hours prior to your appointment, go ahead and check your blood pressure, pulse, oxygen saturation, and your weight (if you have the equipment to check those) and write them all down. When your visit starts, your provider will ask you for this information. If you have an Apple Watch or Kardia device, please plan to have heart rate information ready on the day of your appointment. Please have a pen and paper handy nearby the day of the visit as well."  5. Give patient instructions for MyChart download to smartphone OR [email or web address] as below if video visit (depending on what platform provider is using)  6. Inform patient they will receive a phone call 15 minutes prior to their appointment time (may be from unknown caller ID) so they should be prepared to answer    TELEPHONE CALL NOTE  Vale Mousseau has been deemed a candidate for a follow-up tele-health visit to limit community exposure during the Covid-19 pandemic. I spoke with the patient via phone to ensure availability of phone/video source, confirm preferred email & phone number, and discuss instructions and expectations.  I reminded Ranvir Soden to be prepared with any vital sign and/or heart rhythm information that could potentially be obtained via home monitoring, at the time of his visit. I reminded Osiah Haring to expect a phone call prior to  his visit.  Calla Kicks 01/21/2019 12:04 PM   INSTRUCTIONS FOR DOWNLOADING THE MYCHART APP TO SMARTPHONE  - The patient must first make sure to have activated MyChart and know their login information - If Apple, go to [employer] and type in MyChart in the search bar and download the app. If Android, ask patient to go to [employer] and type in [place] in the search bar and download the app. The app is free but as with any other app downloads, their phone may require them to verify saved payment information or  Apple/Android password.  - The patient will need to then log into the app with their MyChart username and password, and select [facility] as their healthcare provider to link the account. When it is time for your visit, go to the MyChart app, find appointments, and click Begin Video Visit. Be sure to Select Allow for your device to access the Microphone and Camera for your visit. You will then be connected, and your provider will be with you shortly.  **If they have any issues connecting, or need assistance please contact MyChart service desk [phone])**  **If using a computer, in order to ensure the best quality for their visit they will need to use either of the following Internet Browsers: [employer], or Google Chrome**  IF USING DOXIMITY or DOXY.ME - The patient will receive a link just prior to their visit by text.     FULL LENGTH CONSENT FOR TELE-HEALTH VISIT   I hereby voluntarily request, consent and authorize [place] and its employed or contracted physicians, physician assistants, nurse practitioners or other licensed health care professionals (the Practitioner), to provide me with telemedicine health care services (the Services") as deemed necessary by the treating Practitioner. I acknowledge and consent to receive the Services by the Practitioner via telemedicine. I understand that the telemedicine visit will involve communicating with the Practitioner through live audiovisual communication technology and the disclosure of certain medical information by electronic transmission. I acknowledge that I have been given the opportunity to request an in-person assessment or other available alternative prior to the telemedicine visit and am voluntarily participating in the telemedicine visit.  I understand that I have the right to withhold or withdraw my consent to the use of telemedicine in the course of my care at any time, without affecting my right to future care  or treatment, and that the Practitioner or I may terminate the telemedicine visit at any time. I understand that I have the right to inspect all information obtained and/or recorded in the course of the telemedicine visit and may receive copies of available information for a reasonable fee.  I understand that some of the potential risks of receiving the Services via telemedicine include:   Delay or interruption in medical evaluation due to technological equipment failure or disruption;  Information transmitted may not be sufficient (e.g. poor resolution of images) to allow for appropriate medical decision making by the Practitioner; and/or   In rare instances, security protocols could fail, causing a breach of personal health information.  Furthermore, I acknowledge that it is my responsibility to provide information about my medical history, conditions and care that is complete and accurate to the best of my ability. I acknowledge that Practitioner's advice, recommendations, and/or decision may be based on factors not within their control, such as incomplete or inaccurate data provided by me or distortions of diagnostic images or specimens that may result from electronic transmissions. I  understand that the practice of medicine is not an exact science and that Practitioner makes no warranties or guarantees regarding treatment outcomes. I acknowledge that I will receive a copy of this consent concurrently upon execution via email to the email address I last provided but may also request a printed copy by calling the office of [place].    I understand that my insurance will be billed for this visit.   I have read or had this consent read to me.  I understand the contents of this consent, which adequately explains the benefits and risks of the Services being provided via telemedicine.   I have been provided ample opportunity to ask questions regarding this consent and the Services and have had  my questions answered to my satisfaction.  I give my informed consent for the services to be provided through the use of telemedicine in my medical care  By participating in this telemedicine visit I agree to the above.

## 2019-01-21 NOTE — Progress Notes (Signed)
Patient is refusing to leave without documentation regarding what the plan is for his legs.  Called MD-    MD going to call the son to speak to her.

## 2019-01-21 NOTE — Discharge Summary (Addendum)
Physician Discharge Summary   Patient ID: Larry Klein MRN: [id number] DOB/AGE: 05-25-1956 63 y.o.  Admit date: 12/23/2018 Discharge date: 01/21/2019  Primary Care Physician:  Patient, No Pcp Per   Recommendations for Outpatient Follow-up:  1. Follow up with PCP in 1-2 weeks 2. COVID-19 negative 3. Patient will continue hemodialysis in [place] [facility], TTS schedule 4. Patient needs to follow-up at [place] for labs CBC, BMET and follow-up with hematology as per previous appointment 5. Needs follow-up EGD in a month  Home Health: Patient refused skilled nursing facility.  Home health PT OT, HHA, RN arranged by case management Equipment/Devices: DME rolling walker  Discharge Condition: stable  CODE STATUS: FULL  Diet recommendation: Heart healthy diet   Discharge Diagnoses:    . Unstable angina with history of CAD  . Atrial fibrillation with RVR ([place]) . Dysphagia/odynophagia due to food impaction GERD Aspiration pneumonia Acute on chronic systolic CHF Permanent A. 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   albuterol 108 (90 Base) MCG/ACT inhaler Commonly known as:  VENTOLIN HFA Inhale 2 puffs into the lungs every 6 (six) hours as needed for wheezing.   amiodarone 200 MG tablet Commonly known as:  PACERONE Take 1 tablet (200 mg total) by mouth daily. What changed:    how much to take  how to take this  when to take this  additional  instructions   aspirin 81 MG EC tablet Take 1 tablet (81 mg total) by mouth daily.   atorvastatin 40 MG tablet Commonly known as:  LIPITOR Take 1 tablet (40 mg total) by mouth at bedtime.   bismuth subsalicylate 502 DX/41OI suspension Commonly known as:  PEPTO BISMOL Take 30 mLs by mouth every 6 (six) hours as needed for indigestion.   calcium carbonate 500 MG chewable tablet Commonly known as:  TUMS - dosed in mg elemental calcium Chew 2 tablets by mouth 3 (three) times daily before meals.   cefpodoxime 200 MG tablet Commonly known as:  VANTIN Take 1 tab on Tuesday, Thursday, Saturday after HD   diazepam 10 MG tablet Commonly known as:  VALIUM Take 1 tablet (10 mg total) by mouth at bedtime as needed for muscle spasms (leg pain).   gabapentin 600 MG tablet Commonly known as:  NEURONTIN Take 0.5 tablets (300 mg total) by mouth at bedtime.   guaiFENesin 600 MG 12 hr tablet Commonly known as:  MUCINEX Take 1 tablet (600 mg total) by mouth 2 (two) times daily as needed for cough or to loosen phlegm.   HYDROcodone-acetaminophen 5-325 MG tablet Commonly known as:  NORCO/VICODIN Take 1 tablet by mouth every 6 (six) hours as needed for moderate pain or severe pain.   ipratropium-albuterol 0.5-2.5 (3) MG/3ML Soln Commonly known as:  DUONEB Take 3 mLs by nebulization every 6 (six) hours as needed (wheezing).   metoprolol succinate 25 MG 24 hr tablet Commonly known as:  TOPROL-XL Take 1 tablet (25 mg total) by mouth daily. Take  with or immediately following a meal. What changed:    medication strength  how much to take   multivitamin Tabs tablet Take 1 tablet by mouth daily.   pantoprazole 40 MG tablet Commonly known as:  PROTONIX Take 1 tablet (40 mg total) by mouth daily before breakfast.   predniSONE 20 MG tablet Commonly known as:  DELTASONE Take 1 tablet (20 mg total) by mouth daily with breakfast.   umeclidinium-vilanterol 62.5-25 MCG/INH Aepb Commonly known  as:  ANORO ELLIPTA Inhale 1 puff into the lungs daily.        Brief H and P: For complete details please refer to admission H and P, but in brief Patient is a 63 year old male with CAD, COPD, hypertension, ESRD on dialysis presented from [facility] with chest pain, dyspnea on exertion, no fevers or chills.  Patient had undergone CT angiogram of the chest at [facility] which was negative for PE.  Troponins were positive, patient was started on a heparin drip.  COVID test was negative.  Patient was transferred to [facility]. Cardiology evaluated and did not feel candidate for surgical or percutaneous revascularization (prior CABG, thrombocytopenia), added Imdur and has signed off. Nephrology has been following for hemodialysis. Vascular surgery was consulted for right foot pain, hematology consulted for chronic thrombocytopenia. Patient was placed on a steroid trial with improvement on the platelet count. Patient underwent cardiac cath and now on medical management, also underwent lower extremity aortogram, per vascular surgery, no role of revascularization  Hospital Course:   Chest pain/unstable angina with history of CAD -Cardiology was consulted, underwent cardiac cath.  Plan to continue medical therapy: Aspirin, Lipitor, Imdur (held of 4/30 due to borderline BP) -Toprol decreased with holding parameters to 25 mg daily.  Cardiology has signed off.   Dysphagia/odynophagia: In the setting of chronic dysphagia and GERD - On 4/29, patient complained of odynophagia and nausea and vomiting, also has chronic dysphagia for last 3 years prior to admission. -Barium esophagogram showed complete obstruction of the distal esophagus. -Underwent urgent EGD on 4/29 with removal of large fibrous food bolus from the distal esophagus. -Per GI, will need repeat EGD in few weeks. -Continue soft diet  Aspiration pneumonia -Likely from #2, on 4/30, patient's BP was low, with  leukocytosis.  Chest  x-ray showed right upper lung pneumonia, patient was started on Unasyn and transitioned to oral antibiotics He will continue Vantin on TTS after HD for 3 more doses -Currently no wheezing, tolerating soft, diet will need repeat EGD in few weeks.  Acute on chronic systolic CHF -EF 20 to 46% per echo on 3/24.  Volume management with hemodialysis - Continue Toprol.  Needs follow-up echo in 3 months.  Permanent atrial fibrillation -Rate controlled, continue amiodarone -Not on anticoagulation due to thrombocytopenia, patient was to follow-up with hematology in [place], seen by hematology inpatient.   Mild transaminitis in the setting of CHF -LFTs stable, acute viral hepatitis panel negative   ESRD on hemodialysis TTS -Nephrology following, volume management with hemodialysis -Status post left UE AVF on 4/27  PAD status post aortic stent graft with right leg pain, swelling -Vascular surgery was consulted.  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  -Patient was placed on steroid trial since 4/14 -Platelets improving, steroids tapered to 20 mg daily.  Recommended  slow taper as outpatient and follow-up with labs at [place] center.  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   -Patient will need to follow-up with Dr.  Magda Bernheim orthopedics at [facility] in 2 weeks.  Oncology has sent the referral, office of Dr. Redmond Pulling will call the patient. -I have explained all the above to the patient and his son in detail.  Per patient's son, they have received paperwork from [facility] in the mail.  ?  Mechanical falls -During hospitalization, patient refused bed alarm.  No orthostasis.   PT evaluation recommended skilled nursing facility, patient declined SNF at this time.  Home health PT OT arranged for generalized debility.   Day of Discharge S: No acute complaints, seen during HD, tolerating well, wants to go home after HD.  BP 111/71   Pulse 67   Temp (!) 97.5 F (36.4 C) (Oral)   Resp 18   Ht 5\' 11"  (1.803 m)   Wt 90.4 kg Comment: standing  SpO2 99%   BMI 27.80 kg/m   Physical Exam: General: Alert and awake oriented x3 not in any acute distress. HEENT: anicteric sclera, pupils reactive to light and accommodation CVS: S1-S2 clear no murmur rubs or gallops Chest: clear to auscultation bilaterally, no wheezing rales or rhonchi Abdomen: soft nontender, nondistended, normal bowel sounds Extremities: Bilateral lower extremity trace edema, left leg soft tissue mass, TMA left foot  Neuro: No FND's   The results of significant diagnostics from this hospitalization (including imaging, microbiology, ancillary and laboratory) are listed below for reference.      Procedures/Studies:  Ct Angio Ao+bifem W & Or Wo Contrast  Result Date: 12/28/2018 CLINICAL DATA:  Right foot ischemia EXAM: CT ANGIOGRAPHY OF ABDOMINAL AORTA WITH ILIOFEMORAL RUNOFF TECHNIQUE: Multidetector CT imaging of the abdomen, pelvis and lower extremities was performed using the standard protocol during bolus administration of intravenous contrast. Multiplanar CT image reconstructions and MIPs were obtained to evaluate the vascular anatomy. CONTRAST:  1102mL OMNIPAQUE IOHEXOL 350 MG/ML SOLN COMPARISON:  None. FINDINGS:  VASCULAR Aorta: Descending thoracic aorta is 4.2 cm in caliber. An aorto bi-iliac stent graft is in place excluding an aortic aneurysm. Maximal AP and transverse diameters are 6.2 and 5.5 cm. Aortic and iliac limbs are patent. There is no obvious endoleak. Pre contrast and venous phase images are not included in this study. Celiac: Patent. SMA: Mild smooth soft plaque along the wall of the proximal SMA with some atherosclerotic calcification at the origin. No significant narrowing. Renals: A dominant right upper pole renal artery is patent. A lower pole accessory right renal artery does not opacify with contrast likely due to exclusion by the stent graft. A single left renal artery is patent. IMA: Origin has been sacrificed. RIGHT Lower Extremity Inflow: Right common iliac landing zone is patent, distal and nonaneurysmal. There are atherosclerotic changes in the right internal and external iliac arteries without significant narrowing. Outflow: Right common femoral artery is patent with minimal atherosclerotic changes. Profunda femoral artery is patent. The right superficial femoral artery is mildly diffusely diseased but without significant focal narrowing. Runoff: The right popliteal  artery is partially obscured by motion artifact. There is 50% narrowing above the knee joint due to smooth soft plaque on image 274 of series 6. There is grossly 2 vessel runoff via the right posterior tibial and peroneal arteries. LEFT Lower Extremity Inflow: Left common iliac landing zone is distal common nonaneurysmal, and patent. Left external iliac artery is patent with mild diffuse atherosclerotic changes. There is significant narrowing at the origin of the left internal iliac artery. Outflow: Left common femoral artery is patent and mildly disease. The profunda femoral artery is patent. The origin of the left superficial femoral artery is diminutive. This vessel is diffusely diminutive and moderately diseased without significant  focal narrowing. Runoff: The popliteal artery is also obscured partially due to motion artifact. It is grossly patent. There is 1 vessel runoff via the left posterior tibial artery. Review of the MIP images confirms the above findings. NON-VASCULAR Lower chest: Small right pleural effusion. Subsegmental atelectasis at the dependent lung bases. Cardiomegaly. Hepatobiliary: Post cholecystectomy. There is a very small rim calcified area measuring less than 1 cm in the gallbladder fossa of unknown significance which may be related to postoperative changes or fat necrosis in surgical bed. Liver is otherwise within normal limits. Pancreas: Unremarkable Spleen: Unremarkable Adrenals/Urinary Tract: There is severe atrophy of both kidneys. There is a large calculus in the left kidney measuring 1.5 cm. There are hyperdense lesions in the lower pole of the left kidney which are multiple. The largest measures 2.6 cm on image 73. It is associated with a rim calcification. This is a nonspecific finding and may represent a hyperdense complex cyst or an enhancing mass. The bladder contains hyperdense material representing either hemorrhage or contrast. Stomach/Bowel: No obvious mass in the colon. Small bowel is decompressed. Stomach contains enteric contents. Lymphatic: There is no abnormal retroperitoneal adenopathy. Reproductive: Normal prostate. Other: There is moderate free fluid within the pelvis. There is a very small amount of fluid anterior to the liver. Musculoskeletal: No vertebral compression deformity. Bilateral L5 pars defects with grade 1 spondylolisthesis measuring 7 mm. It is associated with severe degenerative disc disease at L5-S1. There is a large mass in the calf just deep to the Achilles tendon measuring 3.9 x 6.5 x 12.9 cm. It is somewhat heterogeneous and relatively low-density. Hounsfield unit measurements are well above simple fluid at 43. It is associated with calcifications. IMPRESSION: VASCULAR Motion  artifact limits this study. The popliteal arteries were partially obscured but are grossly patent. Aorto bi-iliac stent graft is patent excluding an aortic aneurysm. There is no obvious endoleak. Maximal aneurysm sac diameter is 6.2 cm. There are no prior studies available to assess for interval growth. No significant narrowing in the right or left external iliac arteries. There is significant narrowing at the origin of the left internal iliac artery. Right common femoral and femoral arteries are patent with atherosclerotic disease. There is 50% narrowing in the right popliteal artery above the knee. Two vessel runoff to the right ankle. Left common femoral artery is patent. The left superficial femoral artery is diffusely moderately disease without significant focal narrowing. There is 1 vessel runoff to the left ankle via the posterior tibial artery. NON-VASCULAR Small right pleural effusion.  Bibasilar atelectasis. Postcholecystectomy. Calcified area in the gallbladder bed likely is associated with postoperative change. Left nephrolithiasis. There are hypodense lesions in the lower pole of the left kidney which are nonspecific. Differential diagnosis includes hyperdense cyst or enhancing mass. MRI may be helpful to further characterize. If MRI  is not feasible, ultrasound may be helpful to characterize these areas as cystic. Hyperdense material in the bladder is nonspecific. Differential diagnosis includes excreted contrast or hemorrhage. Moderate free fluid in the pelvis. Very minimal fluid in the abdomen about the liver. Large mass within the left calf as described. Differential diagnosis includes nonspecific fluid, hemorrhage, or enhancing neoplasm. Ultrasound or MRI may be helpful. Electronically Signed   By: Marybelle Killings M.D.   On: 12/28/2018 07:47   US Renal  Result Date: 12/30/2018 CLINICAL DATA:  Possible mass on recent CT examination EXAM: RENAL / URINARY TRACT ULTRASOUND COMPLETE COMPARISON:  None.  FINDINGS: Right Kidney: Renal measurements: 8.2 x 3.5 x 3.7 cm = volume: 57 mL. Diffuse cortical thinning is noted. No focal mass or hydronephrosis is noted. Left Kidney: Renal measurements: 10.1 x 4.7 x 3.3 cm = volume: 84 mL. Cortical thinning is noted. Triangular shaped calcification is noted in the lower pole measuring 1.3 cm in greatest dimension similar to that seen on prior CT examination. Cystic lesions are noted which correspond to the hyperdense lesion seen on prior CT examination. Peripheral calcifications are noted similar to that seen on prior CT examination. Bladder: Decompressed IMPRESSION: Atrophic kidneys bilaterally similar to that seen on prior CT. Lower pole nonobstructing stone on the left. Cystic lesions in the left kidney which correspond to the hyperdense lesions on prior CT. Electronically Signed   By: Inez Catalina M.D.   On: 12/30/2018 02:11   Mr Tibia Fibula Left Wo Contrast  Result Date: 01/01/2019 CLINICAL DATA:  Mass-like fullness in the left lower leg. Bilateral foot erythema with pitting edema. Heterogeneous soft tissue mass on ultrasound. EXAM: MRI OF LOWER LEFT EXTREMITY WITHOUT CONTRAST TECHNIQUE: Multiplanar, multisequence MR imaging of the left lower leg was performed. No intravenous contrast was administered due to renal failure. COMPARISON:  Lower extremity ultrasound 12/29/2018. CT runoff 12/27/2018. FINDINGS: Bones/Joint/Cartilage Both lower legs are included on the coronal images. As seen on the prior studies, there is a large mass posteriorly in the distal left lower leg which is further described below. This mass erodes the posterior cortex of the distal tibia and fibula as correlated with CT. No adjacent bone marrow edema or pathologic fracture. There is no involvement the ankle joint which appears ankylosed at the distal tibiofibular joint as correlated with CT. The left knee appears unremarkable. No significant osseous findings in the visualized right lower leg.  Ligaments Not relevant for exam/indication. Muscles and Tendons Mild fatty atrophy throughout the lower extremity musculature, left greater than right. There are extensive calcifications and thickening associated with the extensor tendons the left lower leg. No evidence of tendon rupture. Soft tissues As noted above, there is a large fairly well-circumscribed and lobulated soft tissue mass posteriorly in the distal left lower leg. This measures 14.3 x 4.6 x 6.8 cm and has heterogeneous T1 and T2 signal. Areas of intrinsic T1 shortening may reflect previous hemorrhage. There are small associated calcifications on the previous CT. This mass lies between the distal tibia and fibula and the Achilles tendon. There is cortical erosion of the distal tibia and fibula posteriorly. The mass is incompletely characterized without contrast. There is diffuse subcutaneous edema in both lower legs, left greater than right, without focal fluid collection. IMPRESSION: 1. Large soft tissue mass posteriorly in the distal left lower leg with associated cortical erosion of the distal tibia and fibula. The mass is heterogeneous in signal and incompletely characterized without contrast, although not typical of a benign  hematoma. 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 Tissue sampling or excision biopsy recommended. 2. Underlying ankylosis of the distal tibiofibular joint with calcifications within the lower leg extensor tendons, likely posttraumatic. Asymmetric left lower leg muscular atrophy. 3. Subcutaneous edema in both lower legs, worse on the left. Electronically Signed   By: Richardean Sale M.D.   On: 01/01/2019 15:05   Dg Chest Port 1 View  Result Date: 01/14/2019 CLINICAL DATA:  Shortness of breath. EXAM: PORTABLE CHEST 1 VIEW COMPARISON:  Chest x-ray dated December 22, 2018. FINDINGS: Unchanged tunneled left internal jugular dialysis catheter with tips in the mid to distal SVC. Stable cardiomegaly status post CABG.  Normal pulmonary vascularity. New patchy consolidation in the right upper lobe. No pleural effusion or pneumothorax. No acute osseous abnormality. IMPRESSION: 1. Right upper lobe pneumonia. Electronically Signed   By: Titus Dubin M.D.   On: 01/14/2019 13:11   Vas [place] Burnard Bunting With/wo Tbi  Result Date: 12/27/2018 LOWER EXTREMITY DOPPLER STUDY Indications: Pain, discoloration.  Performing Technologist: Maudry Mayhew MHA, RVT, RDCS, RDMS  Examination Guidelines: A complete evaluation includes at minimum, Doppler waveform signals and systolic blood pressure reading at the level of bilateral brachial, anterior tibial, and posterior tibial arteries, when vessel segments are accessible. Bilateral testing is considered an integral part of a complete examination. Photoelectric Plethysmograph (PPG) waveforms and toe systolic pressure readings are included as required and additional duplex testing as needed. Limited examinations for reoccurring indications may be performed as noted.  ABI Findings: +---------+------------------+-----+----------+--------+ Right    Rt Pressure (mmHg)IndexWaveform  Comment  +---------+------------------+-----+----------+--------+ Brachial 136                    triphasic          +---------+------------------+-----+----------+--------+ PTA      94                0.69 monophasic         +---------+------------------+-----+----------+--------+ DP       98                0.72 monophasic         +---------+------------------+-----+----------+--------+ Great Toe36                0.26                    +---------+------------------+-----+----------+--------+ +--------+------------------+-----+----------+-------+ Left    Lt Pressure (mmHg)IndexWaveform  Comment +--------+------------------+-----+----------+-------+ [id number]                    triphasic         +--------+------------------+-----+----------+-------+ PTA     91                0.67  monophasic        +--------+------------------+-----+----------+-------+ DP      116               0.85 monophasic        +--------+------------------+-----+----------+-------+ +-------+-----------+---------------------+------------+---------------------+ ABI/TBIToday's ABIToday's TBI          Previous ABIPrevious TBI          +-------+-----------+---------------------+------------+---------------------+ Right  0.72       0.26                 0.98        Not obtained          +-------+-----------+---------------------+------------+---------------------+ Left   0.85       Unable to be obtained0.53  Unable to be obtained +-------+-----------+---------------------+------------+---------------------+ Right ABIs appear decreased compared to prior study on 12/08/2018.  Summary: Right: Resting right ankle-brachial index indicates moderate right lower extremity arterial disease. The right toe-brachial index is abnormal. Left: Resting left ankle-brachial index indicates mild left lower extremity arterial disease. Unable to obtain left great toe pressure due to transmetatarsal amputation x50 years post truck falling on his foot. Given anatomic distortion to the left foot and ankle, Doppler interrogation and results can be difficult to duplicate.  *See table(s) above for measurements and observations.  Electronically signed by Ruta Hinds MD on 12/27/2018 at 5:03:01 PM.   Final    [place] Soft Tissue Non Vascular  Result Date: 12/30/2018 CLINICAL DATA:  Possible left calf mass EXAM: ULTRASOUND LEFT LOWER EXTREMITY LIMITED TECHNIQUE: Ultrasound examination of the lower extremity soft tissues was performed in the area of clinical concern. COMPARISON:  12/27/2018 FINDINGS: Scanning in the area of clinical concern reveals a complex predominantly solid mass lesion measuring approximately 11.5 x 6.0 cm. This corresponds to that seen on recent CT examination. No significant  vascularity is noted within. IMPRESSION: Soft tissue mass lesion as described. No significant vascularity is noted within. This may represent a posttraumatic hematoma. Correlation with the clinical history is recommended. MRI would be helpful for further characterization. Electronically Signed   By: Inez Catalina M.D.   On: 12/30/2018 02:06   Vas [place] Lower Extremity Venous (dvt)  Result Date: 12/27/2018  Lower Venous Study Indications: Pain, Swelling, and Erythema.  Performing Technologist: Maudry Mayhew MHA, RDMS, RVT, RDCS  Examination Guidelines: A complete evaluation includes B-mode imaging, spectral Doppler, color Doppler, and power Doppler as needed of all accessible portions of each vessel. Bilateral testing is considered an integral part of a complete examination. Limited examinations for reoccurring indications may be performed as noted.  Right Venous Findings: +---------+---------------+---------+-----------+----------+--------------+          CompressibilityPhasicitySpontaneityPropertiesSummary        +---------+---------------+---------+-----------+----------+--------------+ CFV      Full           No       Yes                  pulsatile flow +---------+---------------+---------+-----------+----------+--------------+ SFJ      Full                                                        +---------+---------------+---------+-----------+----------+--------------+ FV Prox  Full                                                        +---------+---------------+---------+-----------+----------+--------------+ FV Mid   Full                                                        +---------+---------------+---------+-----------+----------+--------------+ FV DistalFull                                                        +---------+---------------+---------+-----------+----------+--------------+  PFV      Full                                                         +---------+---------------+---------+-----------+----------+--------------+ POP      Full           No       Yes                  pulsatile flow +---------+---------------+---------+-----------+----------+--------------+ PTV      Full                                                        +---------+---------------+---------+-----------+----------+--------------+ PERO     Full                                                        +---------+---------------+---------+-----------+----------+--------------+  Left Venous Findings: +---+---------------+---------+-----------+----------+--------------+    CompressibilityPhasicitySpontaneityPropertiesSummary        +---+---------------+---------+-----------+----------+--------------+ CFVFull           No       Yes                  pulsatile flow +---+---------------+---------+-----------+----------+--------------+    Summary: Right: There is no evidence of deep vein thrombosis in the lower extremity. No cystic structure found in the popliteal fossa. Left: No evidence of common femoral vein obstruction.  *See table(s) above for measurements and observations. Electronically signed by Ruta Hinds MD on 12/27/2018 at 5:03:22 PM.    Final    Vas [place] Upper Ext Vein Mapping (pre-op Avf)  Result Date: 01/10/2019 UPPER EXTREMITY VEIN MAPPING  Indications: Pre-op dialysis access.  Examination Guidelines: A complete evaluation includes B-mode imaging, spectral Doppler, color Doppler, and power Doppler as needed of all accessible portions of each vessel. Bilateral testing is considered an integral part of a complete examination. Limited examinations for reoccurring indications may be performed as noted. +-----------------+-------------+----------+------------+ Right Cephalic   Diameter (cm)Depth (cm)  Findings   +-----------------+-------------+----------+------------+ Prox upper arm       0.36        0.30                 +-----------------+-------------+----------+------------+ Mid upper arm        0.34        0.31                +-----------------+-------------+----------+------------+ Dist upper arm       0.35        0.25                +-----------------+-------------+----------+------------+ Antecubital fossa    0.38        0.27                +-----------------+-------------+----------+------------+ Prox forearm                            IV, bandages +-----------------+-------------+----------+------------+ Mid forearm  0.27        0.20                +-----------------+-------------+----------+------------+ Wrist                0.25        0.22                +-----------------+-------------+----------+------------+ +-----------------+-------------+----------+--------+ Left Cephalic    Diameter (cm)Depth (cm)Findings +-----------------+-------------+----------+--------+ Prox upper arm       0.40        0.23            +-----------------+-------------+----------+--------+ Mid upper arm        0.37        0.24            +-----------------+-------------+----------+--------+ Dist upper arm       0.50        0.27            +-----------------+-------------+----------+--------+ Antecubital fossa    0.52        0.18            +-----------------+-------------+----------+--------+ Prox forearm         0.50        0.41            +-----------------+-------------+----------+--------+ Mid forearm          0.46        0.31            +-----------------+-------------+----------+--------+ Wrist                0.42        0.24            +-----------------+-------------+----------+--------+ *See table(s) above for measurements and observations.  Diagnosing physician: Monica Martinez MD Electronically signed by Monica Martinez MD on 01/10/2019 at 11:56:32 AM.    Final    US Abdomen Limited Ruq  Result Date: 12/26/2018 CLINICAL DATA:  Abnormal liver function  tests. EXAM: ULTRASOUND ABDOMEN LIMITED RIGHT UPPER QUADRANT COMPARISON:  None. FINDINGS: Gallbladder: Status post cholecystectomy. Common bile duct: Diameter: 7 mm which is within normal limits for post cholecystectomy status. Liver: 8 mm left hepatic cyst is noted. Within normal limits in parenchymal echogenicity. Portal vein is patent on color Doppler imaging with normal direction of blood flow towards the liver. IMPRESSION: Status post cholecystectomy. Small left hepatic cyst. No other abnormality seen in the right upper quadrant of the abdomen. Electronically Signed   By: Marijo Conception, M.D.   On: 12/26/2018 22:27   Dg Esophagus W Single Cm (sol Or Thin Ba)  Result Date: 01/13/2019 CLINICAL DATA:  Inability to swallow secretions. EXAM: ESOPHOGRAM/BARIUM SWALLOW TECHNIQUE: Single contrast examination was performed using  thin barium. FLUOROSCOPY TIME:  Fluoroscopy Time:  1 minutes and 0 seconds. Radiation Exposure Index (if provided by the fluoroscopic device): 61.4 mGy Number of Acquired Spot Images: COMPARISON:  None. FINDINGS: Patient was placed LPO relative to the fluoro table at about 30 degrees head up. He took 1 swallow of thin barium which passed to the level of the distal esophagus where there is a complete mechanical obstruction. Despite several minutes of observation, no barium passed beyond the obstruction into the stomach. IMPRESSION: Complete mechanical obstruction in the distal third of the esophagus. Some images outline a protruding intraluminal defect that could be food lodged in the distal esophagus although soft tissue from obstructing neoplasm is also possible. Electronically Signed   By: Verda Cumins.D.  On: 01/13/2019 16:15       LAB RESULTS: Basic Metabolic Panel: Recent Labs  Lab 01/19/19 0729 01/21/19 0731  NA 138 137  K 4.6 4.5  CL 96* 100  CO2 22 22  GLUCOSE 135* 133*  BUN 73* 65*  CREATININE 8.46* 7.47*  CALCIUM 8.5* 8.5*  PHOS 6.5* 4.8*   Liver  Function Tests: Recent Labs  Lab 01/15/19 0359 01/19/19 0729 01/21/19 0731  AST 23  --   --   ALT 10  --   --   ALKPHOS 93  --   --   BILITOT 1.2  --   --   PROT 5.6*  --   --   ALBUMIN 2.8* 2.7* 2.6*   No results for input(s): LIPASE, AMYLASE in the last 168 hours. No results for input(s): AMMONIA in the last 168 hours. CBC: Recent Labs  Lab 01/15/19 0359  01/20/19 0446 01/21/19 0731  WBC 15.6*   < > 12.5* 15.4*  NEUTROABS 14.6*  --   --   --   HGB 11.1*   < > 11.0* 10.6*  HCT 34.9*   < > 35.1* 34.1*  MCV 89.5   < > 89.3 89.3  PLT 65*   < > 79* 85*   < > = values in this interval not displayed.   Cardiac Enzymes: No results for input(s): CKTOTAL, CKMB, CKMBINDEX, TROPONINI in the last 168 hours. BNP: Invalid input(s): POCBNP CBG: Recent Labs  Lab 01/18/19 1617  GLUCAP 151*      Disposition and Follow-up: Discharge Instructions    Diet - low sodium heart healthy   Complete by:  As directed    Increase activity slowly   Complete by:  As directed        DISPOSITION: Home health PT [place]    Park Liter, MD Follow up in 1 week(s).   Specialty:  Cardiology Contact information: [place] [address] [phone]        Elam Dutch, MD In 6 weeks.   Specialties:  Vascular Surgery, Cardiology Why:  Office will call you to arrange your appt (sent) Contact information: [place] [address] [phone]        Milus Banister, MD Follow up in 1 month(s).   Specialty:  Gastroenterology Why:  repeat EGD Contact information: [address] [place] [address] [phone]        continue dialysis Follow up.        Magda Bernheim, MD Follow up.   Specialty:  Orthopedic Surgery Why:  left calf mass Contact information: [place] [address] [phone]        [facility] Follow up in 1 week(s).   Why:  need CBC  checked and follow-up with hematology       Derwood Kaplan, MD Follow up in 1 week(s).   Specialty:  Oncology Why:  low platelet, prednisone taper per Dr Jimmy Picket information: [place] [place] [address] [phone]            Time coordinating discharge:  45 minutes  Signed:   Estill Cotta M.D. [facility] 01/21/2019, 10:47 AM

## 2019-01-21 NOTE — TOC Transition Note (Addendum)
Transition of Care [facility]) - CM/SW Discharge Note   Patient Details  Name: Larry Klein MRN: [id number] Date of Birth: [birth year]  [facility]) CM/SW Contact:  Sherrilyn Rist Phone Number: [phone] 01/21/2019, 12:01 PM   Clinical Narrative:    Patient is for discharge home today; pt refused SNF placement / only one bed offer that could provide HD transportation, pt declined offer; unable to arrange [facility] services per pt due to several guns in the home per patient.  1:20 pm - Rolling walker ordered as requested and to be delivered to the room today prior to dc home; B Pennie Rushing   Final next level of care: Home/Self Care Barriers to Discharge: No Barriers Identified   Patient Goals and CMS Choice Patient states their goals for this hospitalization and ongoing recovery are:: to get better CMS Medicare.gov Compare Post Acute Care list provided to:: Patient Choice offered to / list presented to : NA  Discharge Placement                       Discharge Plan and Services In-house Referral: NA Discharge Planning Services: CM Consult Post Acute Care Choice: NA          DME Arranged: N/A DME Agency: NA       HH Arranged: NA HH Agency: NA        Social Determinants of Health (SDOH) Interventions     Readmission Risk Interventions No flowsheet data found.

## 2019-01-21 NOTE — Progress Notes (Addendum)
Subjective:  On hd ,no sob, 2.3 kg gain overnight/ noted refused NHP , ?dc home today    Objective Vital signs in last 24 hours: Vitals:   01/21/19 0703 01/21/19 0730 01/21/19 0800 01/21/19 0830  BP: (!) 141/100 128/78 101/67 (!) 92/59  Pulse: 77 68 91 72  Resp:      Temp:      TempSrc:      SpO2:      Weight:      Height:       Weight change: -1.961 kg  Physical Exam: General: on HD  Alert ,talkative , pleasant, NAD Heart:S1,S2 RRR Lungs:CTAB anteriorly, decreased in bases posteriorly. No WOB. Abdomen: bs pos. Soft , NT, ND Extremities:2-3+ pitting BLE. L TMA Dialysis Access:LIJ TDC bfr 400 on hd  /. L AVF + bruit   op hd =TTS [place] 4h400/800 92.5kg 3K/2.25bath P2 LIJTDC Heparin none -Calcitriol 0.5 mcg PO TIW  Problem/Plan: 1.CAD with unstable angina:S/p cardiac cath, on medical therapy. Management per primary. 2. Nausea/dysphagia:DG esophagus showed obstruction of distal esophagus. S/p EGD4/29with removal of obstruction. Management per GI/Primary.  3.RUL aspiration PNA:in hospital Dx, on IV Unasyn , perprimary team. 2. ESRD:TTS schedule.HD  on schedule. No heparin. Pt started HD in [place] 12/2017, moved here Jan 2020 and set up at [facility] HD.S/pL BCAVF placement on 01/11/19(L RC AVF Jan '20 didn't work). To follow up with [facility] in 1 month. 3.HTN/volume:Under dry wt here.BP's soft.Lower dry wt dc.Some LE edema, limited UF due to low BP's.stressed vol compliance again to him .fu post hd wt for new lower edw at op hd  4. Anemiackd:Hgb10.5No ESA indicated at this time.Follow HGB. 5. Secondary hyperparathyroidism:Calcium 8.5, corrected 9.2. Continue calcitriol. Phos4.5  On  calcium acetate 667 mg PO TID AC. 6. Nutrition:Albumin 2.6. Poor appetite secondary to nausea/dysphagia at this time.Add prostat, renal vits. 7. PAD s/p aortic stent, leg swelling:Reportedly no role for revascularization. 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  8. A.fib:Not on anticoagulation due to thrombocytopenia. Rate currently controlled. Per primary/cardiology.  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 COPD:Stable dyspnea  Ernest Haber, PA-C [facility] Beeper [phone] 01/21/2019,8:34 AM  LOS: 28 days   I have seen and examined this patient and agree with the plan of care. Seen on HD LIJ TC    BP 85/56 2/2.5  UF 3L (2.5 net) 2/2.5 baths   Dwana Melena, MD 01/21/2019, 9:47 AM  Labs: Basic Metabolic Panel: Recent Labs  Lab 01/16/19 0250 01/19/19 0729 01/21/19 0731  NA 137 138 137  K 4.9 4.6 4.5  CL 98 96* 100  CO2 20* 22 22  GLUCOSE 121* 135* 133*  BUN 61* 73* 65*  CREATININE 6.94* 8.46* 7.47*  CALCIUM 8.9 8.5* 8.5*  PHOS  --  6.5* 4.8*   Liver Function Tests: Recent Labs  Lab 01/15/19 0359 01/19/19 0729 01/21/19 0731  AST 23  --   --   ALT 10  --   --   ALKPHOS 93  --   --   BILITOT 1.2  --   --   PROT 5.6*  --   --   ALBUMIN 2.8* 2.7* 2.6*   No results for input(s): LIPASE, AMYLASE in the last 168 hours. No results for input(s): AMMONIA in the last 168 hours. CBC: Recent Labs  Lab 01/15/19 0359  01/17/19 0258 01/18/19 0543 01/19/19 0417 01/20/19  1840 01/21/19 0731  WBC 15.6*   < > 12.8* 11.7* 12.8* 12.5* 15.4*  NEUTROABS 14.6*  --   --   --   --   --   --   HGB 11.1*   < > 10.8* 11.0* 10.5* 11.0* 10.6*  HCT 34.9*   < > 33.9* 33.9* 33.1* 35.1* 34.1*  MCV 89.5   < > 87.8 88.3 88.3 89.3 89.3  PLT 65*   < > 73* 86* 75* 79* 85*   < > = values in this interval not displayed.   Cardiac Enzymes: No results for input(s): CKTOTAL, CKMB, CKMBINDEX, TROPONINI in the last 168 hours. CBG: Recent Labs  Lab 01/18/19 1617  GLUCAP 151*    Studies/Results: No results found. Medications: . sodium chloride Stopped (01/17/19 1759)  . sodium  chloride    . sodium chloride     . amiodarone  200 mg Oral Daily  . aspirin  81 mg Oral Daily  . atorvastatin  80 mg Oral q1800  . calcitRIOL  0.5 mcg Oral Q T,Th,Sa-HD  . calcium acetate  667 mg Oral TID WC  . cefpodoxime  200 mg Oral Once per day on Tue Wed Thu Sat  . Chlorhexidine Gluconate Cloth  6 each Topical Q0600  . feeding supplement (PRO-STAT SUGAR FREE 64)  30 mL Oral BID  . gabapentin  300 mg Oral QHS  . guaiFENesin  600 mg Oral BID  . metoprolol succinate  25 mg Oral Daily  . multivitamin  1 tablet Oral Daily  . pantoprazole  40 mg Oral BID AC  . predniSONE  20 mg Oral Q breakfast  . sodium chloride flush  3 mL Intravenous Q12H  . umeclidinium-vilanterol  1 puff Inhalation Daily

## 2019-01-27 ENCOUNTER — Telehealth: Payer: Self-pay | Admitting: Emergency Medicine

## 2019-01-27 ENCOUNTER — Other Ambulatory Visit: Payer: Self-pay

## 2019-01-27 ENCOUNTER — Telehealth (INDEPENDENT_AMBULATORY_CARE_PROVIDER_SITE_OTHER): Payer: Medicare Other | Admitting: Cardiology

## 2019-01-27 ENCOUNTER — Encounter: Payer: Self-pay | Admitting: Cardiology

## 2019-01-27 VITALS — BP 123/77 | Wt 195.1 lb

## 2019-01-27 DIAGNOSIS — N186 End stage renal disease: Secondary | ICD-10-CM

## 2019-01-27 DIAGNOSIS — I255 Ischemic cardiomyopathy: Secondary | ICD-10-CM | POA: Diagnosis not present

## 2019-01-27 DIAGNOSIS — D696 Thrombocytopenia, unspecified: Secondary | ICD-10-CM

## 2019-01-27 DIAGNOSIS — I739 Peripheral vascular disease, unspecified: Secondary | ICD-10-CM

## 2019-01-27 DIAGNOSIS — I480 Paroxysmal atrial fibrillation: Secondary | ICD-10-CM

## 2019-01-27 NOTE — Telephone Encounter (Signed)
Left message for patient to return call he needs a follow up appointment next week in the office with Dr. Agustin Cree.

## 2019-01-27 NOTE — Patient Instructions (Signed)
Medication Instructions:  Your physician recommends that you continue on your current medications as directed. Please refer to the Current Medication list given to you today.  If you need a refill on your cardiac medications before your next appointment, please call your pharmacy.   Lab work: None.  If you have labs (blood work) drawn today and your tests are completely normal, you will receive your results only by: Marland Kitchen MyChart Message (if you have MyChart) OR . A paper copy in the mail If you have any lab test that is abnormal or we need to change your treatment, we will call you to review the results.  Testing/Procedures: None.   Follow-Up: At [facility], you and your health needs are our priority.  As part of our continuing mission to provide you with exceptional heart care, we have created designated Provider Care Teams.  These Care Teams include your primary Cardiologist (physician) and Advanced Practice Providers (APPs -  Physician Assistants and Nurse Practitioners) who all work together to provide you with the care you need, when you need it. You will need a follow up appointment in 1 weeks.  Please call our office 2 months in advance to schedule this appointment.  You may see Jenne Campus, MD or another member of our [place] Provider Team in [place]: Shirlee More, MD . Jyl Heinz, MD  Any Other Special Instructions Will Be Listed Below (If Applicable).

## 2019-01-28 NOTE — Progress Notes (Signed)
Virtual Visit via Video Note   This visit type was conducted due to national recommendations for restrictions regarding the COVID-19 Pandemic (e.g. social distancing) in an effort to limit this patient's exposure and mitigate transmission in our community.  Due to his co-morbid illnesses, this patient is at least at moderate risk for complications without adequate follow up.  This format is felt to be most appropriate for this patient at this time.  All issues noted in this document were discussed and addressed.  A limited physical exam was performed with this format.  Please refer to the patient's chart for his consent to telehealth for [facility].  Evaluation Performed:  Follow-up visit  This visit type was conducted due to national recommendations for restrictions regarding the COVID-19 Pandemic (e.g. social distancing).  This format is felt to be most appropriate for this patient at this time.  All issues noted in this document were discussed and addressed.  No physical exam was performed (except for noted visual exam findings with Video Visits).  Please refer to the patient's chart (MyChart message for video visits and phone note for telephone visits) for the patient's consent to telehealth for [facility].  Date:  01/28/2019  ID: Larry Klein, DOB May 14, 1956, MRN [id number]   Patient Location: [place] [address]   Provider location:   [place] Office  PCP:  Patient, No Pcp Per  Cardiologist:  Jenne Campus, MD     Chief Complaint: I recently spent a month in the hospital  History of Present Illness:    Larry Klein is a 63 y.o. male  who presents via audio/video conferencing for a telehealth visit today.  This is a very complex case.  About a month ago he ended up going to round of hospital because of weakness fatigue chest pain as well as fast heart rate.  He was found to be in atrial fibrillation with fast ventricular response also troponin  I were abnormal.  He was transferred to [facility] for evaluation.  His past medical history is quite complex.  He is on hemodialysis, also does have a history of coronary artery disease with coronary artery bypass graft done years ago in [place], peripheral vascular disease, hypertension, dyslipidemia, COPD.  While in [place]: Quite extensive evaluation has been done cardiac catheterization was performed he felt not to be candidate for any intervention.  Medical therapy was augmented and continued.  He is also came with atrial fibrillation it looks like he converted spontaneously since last EKG I have in the chart showing normal sinus rhythm.  During my interview today he is doing well he said he still complain of being weak tired exhausted but much better compared to the time that he end up in the hospital.  He goes for dialysis 3 times a week and he complained of having falling spells.  He said when he gets up he will get very weak and then few times he fell down.  He never completely passed out.  It never happened when he sits it never happened when he laid down.  Denies having any recent chest pain no palpitations no swelling of lower extremities.   The patient does not have symptoms concerning for COVID-19 infection (fever, chills, cough, or new SHORTNESS OF BREATH).    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  His RCA vein graft is widely patent to the PDA.  His native right coronary artery is occluded.  His LIMA is widely patent to an occluded LAD.  His diagonal branch and obtuse marginal branch sequential vein graft is occluded at the origin of the aorta however the continuation from the diagonal branch to the distal circumflex marginal is patent.  He has a high-grade lesion at the origin of the nondominant circumflex  however I think he is getting enough perfusion to the distal circumflex from his patent sequential limb of his vein graft.  His right heart cath suggested he is volume overloaded with elevated right atrial pressures, pulmonary capillary wedge pressure and LVEDP.  At this point, given his thrombocytopenia I do not feel he would benefit from stenting of the origin of the circumflex which would require dual antiplatelet therapy.  I think he needs to be more aggressively dialyzed to optimize his hemodynamics.  His right common femoral puncture site was closed with a MYNX device.  The patient left lab in stable condition.  His anatomy was reviewed with Dr. Peter Martinique, the his attending cardiologist.   Lower extremities aortogram done on 08 January 2019 showed:  Findings: Aortogram revealed no significant aortoiliac disease and the aortobiiliac endograft appears appropriately positioned with no evidence of endoleak. Right lower extremity arteriogram showed a patent common femoral and profunda as well as a calcified but patent SFA with no flow-limiting stenosis. The above and below-knee popliteal artery was widely patent. Patient has dominant runoff in the right lower extremity via the posterior tibial artery with inline flow into the plantar arch. Anterior tibial is occluded throughout its course and there is no dorsalis pedis that reconstitutes. The peroneal is also patent although diminutive. Patient has significant small vessel disease in the right foot. Left lower extremity arteriogram revealed a patent common femoral, profunda, SFA with no flow-limiting stenosis. Above and below-knee popliteal artery was patent. Patient had dominant runoff in the left lower extremity via the posterior tibial with inline flow into the plantar arch. Peroneal was also patent although diminutive. The anterior tibial is occluded throughout its course with no filling of the dorsalis pedis.   Echocardiogram done on 08 December 2018 showed:  1. The left ventricle has severely reduced systolic function, with an ejection fraction of 20-25%. The cavity size was moderately dilated. There is mildly increased left ventricular wall thickness. Left ventricular diastolic function could not be  evaluated secondary to atrial fibrillation. Left ventricular diffuse hypokinesis.  2. The right ventricle has mildly reduced systolic function. The cavity was mildly enlarged. There is no increase in right ventricular wall thickness.  3. Left atrial size was moderately dilated.  4. Right atrial size was moderately dilated.  5. The mitral valve is degenerative. Moderate thickening of the mitral valve leaflet. Mitral valve regurgitation is moderate to severe by color flow Doppler. The MR jet is anteriorly-directed.  6. The tricuspid valve is grossly normal.  7. The aortic valve was not well visualized Moderate sclerosis of the aortic valve.  8. The inferior vena cava was dilated in size with <50% respiratory variability.   Past Medical History:  Diagnosis Date  . COPD (chronic obstructive pulmonary disease) ([place])   . Coronary artery disease   . Dyspnea   . High cholesterol   . Hypertension   . Peripheral vascular disease ([place])   . Renal disorder     Past Surgical History:  Procedure Laterality Date  . AV FISTULA  PLACEMENT Left 01/11/2019   Procedure: Creation Brachiocephalic Arteriovenous Fistula;  Surgeon: Elam Dutch, MD;  Location: [place];  Service: Vascular;  Laterality: Left;  . CARDIAC SURGERY    . CHOLECYSTECTOMY    . CORONARY ARTERY BYPASS GRAFT    . ESOPHAGOGASTRODUODENOSCOPY (EGD) WITH PROPOFOL N/A 01/13/2019   Procedure: ESOPHAGOGASTRODUODENOSCOPY (EGD) WITH PROPOFOL;  Surgeon: Milus Banister, MD;  Location[facility] END[facility]COPY;  Service: Endoscopy;  Laterality: N/A;  . FOREIGN BODY REMOVAL  01/13/2019   Procedure: FOREIGN BODY REMOVAL;  Surgeon: Milus Banister, MD;  Location: [place];  Service: Endoscopy;;   . LOWER EXTREMITY ANGIOGRAPHY Bilateral 01/08/2019   Procedure: LOWER EXTREMITY ANGIOGRAPHY;  Surgeon: [name], MD;  Location: [place] CV LAB;  Service: Cardiovascular;  Laterality: Bilateral;  . RIGHT/LEFT HEART CATH AND CORONARY/GRAFT ANGIOGRAPHY N/A 01/07/2019   Procedure: RIGHT/LEFT HEART CATH AND CORONARY/GRAFT ANGIOGRAPHY;  Surgeon: Lorretta Harp, MD;  Location: [place] CV LAB;  Service: Cardiovascular;  Laterality: N/A;  . THROMBECTOMY W/ EMBOLECTOMY Left 09/18/2018   Procedure: ARTERIOVENOUS FISTULA LEFT ARM;  Surgeon: Rosetta Posner, MD;  Location: [facility] OR;  Service: Vascular;  Laterality: Left;     Current Meds  Medication Sig  . acetaminophen (TYLENOL) 500 MG tablet Take 1,000 mg by mouth as needed for mild pain or headache.  . albuterol (PROVENTIL HFA;VENTOLIN HFA) 108 (90 Base) MCG/ACT inhaler Inhale 2 puffs into the lungs every 6 (six) hours as needed for wheezing.  Marland Kitchen amiodarone (PACERONE) 200 MG tablet Take 1 tablet (200 mg total) by mouth daily.  Marland Kitchen aspirin EC 81 MG EC tablet Take 1 tablet (81 mg total) by mouth daily.  Marland Kitchen atorvastatin (LIPITOR) 40 MG tablet Take 1 tablet (40 mg total) by mouth at bedtime.  . bismuth subsalicylate (PEPTO BISMOL) 262 MG/15ML suspension Take 30 mLs by mouth every 6 (six) hours as needed for indigestion.  . calcium carbonate (TUMS - DOSED IN MG ELEMENTAL CALCIUM) 500 MG chewable tablet Chew 2 tablets by mouth 3 (three) times daily before meals.  . cefpodoxime (VANTIN) 200 MG tablet Take 1 tab on Tuesday, Thursday, Saturday after HD  . diazepam (VALIUM) 10 MG tablet Take 1 tablet (10 mg total) by mouth at bedtime as needed for muscle spasms (leg pain).  Marland Kitchen gabapentin (NEURONTIN) 600 MG tablet Take 0.5 tablets (300 mg total) by mouth at bedtime.  Marland Kitchen guaiFENesin (MUCINEX) 600 MG 12 hr tablet Take 1 tablet (600 mg total) by mouth 2 (two) times daily as needed for cough or to loosen phlegm.  Marland Kitchen HYDROcodone-acetaminophen (NORCO/VICODIN)  5-325 MG tablet Take 1 tablet by mouth every 6 (six) hours as needed for moderate pain or severe pain.  Marland Kitchen ipratropium-albuterol (DUONEB) 0.5-2.5 (3) MG/3ML SOLN Take 3 mLs by nebulization every 6 (six) hours as needed (wheezing).  . metoprolol succinate (TOPROL-XL) 25 MG 24 hr tablet Take 1 tablet (25 mg total) by mouth daily. Take with or immediately following a meal.  . montelukast (SINGULAIR) 10 MG tablet Take 10 mg by mouth at bedtime.  . multivitamin (RENA-VIT) TABS tablet Take 1 tablet by mouth daily.  . pantoprazole (PROTONIX) 40 MG tablet Take 1 tablet (40 mg total) by mouth daily before breakfast.  . predniSONE (DELTASONE) 20 MG tablet Take 1 tablet (20 mg total) by mouth daily with breakfast.  . umeclidinium-vilanterol (ANORO ELLIPTA) 62.5-25 MCG/INH AEPB Inhale 1 puff into the lungs daily.      Family History: The patient's family history includes Heart disease  in his father and mother.   ROS:   Please see the history of present illness.     All other systems reviewed and are negative.   Labs/Other Tests and Data Reviewed:     Recent Labs: 12/07/2018: B Natriuretic Peptide 1,637.3 12/24/2018: TSH 2.734 12/28/2018: Magnesium 2.1 01/15/2019: ALT 10 01/21/2019: BUN 65; Creatinine, Ser 7.47; Hemoglobin 10.6; Platelets 85; Potassium 4.5; Sodium 137  Recent Lipid Panel    Component Value Date/Time   CHOL 125 12/09/2018 0311   TRIG 90 12/09/2018 0311   HDL 44 12/09/2018 0311   CHOLHDL 2.8 12/09/2018 0311   VLDL 18 12/09/2018 0311   LDLCALC 63 12/09/2018 0311      Exam:    Vital Signs:  BP 123/77   Wt 195 lb 1.7 oz (88.5 kg)   BMI 27.21 kg/m     Wt Readings from Last 3 Encounters:  01/27/19 195 lb 1.7 oz (88.5 kg)  01/21/19 193 lb 5.5 oz (87.7 kg)  12/13/18 207 lb 9.6 oz (94.2 kg)     Well nourished, well developed in no acute distress. Is alert awake oriented x3 not in distress during my conversation.  Denies having any significant swelling of lower extremities.   Diagnosis for this visit:   1. Ischemic cardiomyopathy   2. PVD (peripheral vascular disease) ([facility])   3. Paroxysmal atrial fibrillation ([place])   4. ESRD (end stage renal disease) on dialysis ([place])   5. Thrombocytopenia ([place])      ASSESSMENT & PLAN:    1.  Ischemic cardiomyopathy with echocardiogram showing ejection fraction 20 to 25%.  His medications are reviewed and all appears to be appropriate for the moment.  However overall is a very complex situation and I do think I can properly assess him over the video link.  Therefore, disposition is to bring him back to the office next week so I can look at him and examine him thoroughly and appropriately. 2.  Peripheral vascular disease on medical therapy right now.  Does have a follow-up appointment with vascular surgeon. 3.  Paroxysmal atrial fibrillation there is no notation in the chart if he converted to sinus rhythm however last EKG I see is with sinus rhythm.  He is not anticoagulated all discussed need to be reviewed and answered.  Understand he gets significant thrombocytopenia and I suspect that is the reason why he is not anticoagulated.  In the meantime we will continue with amiodarone. 4.  End-stage renal disease.  We will continue present management.  He is getting dialysis 3 times a week. 5.  Thrombocytopenia.  We will recheck CBC.  Overall is a very complex situation.  Required a lot of reviewing his record I will bring him to the office next week so I can properly assess him.  COVID-19 Education: The signs and symptoms of COVID-19 were discussed with the patient and how to seek care for testing (follow up with PCP or arrange E-visit).  The importance of social distancing was discussed today.  Patient Risk:   After full review of this patients clinical status, I feel that they are at least moderate risk at this time.  Time:   Today, I have spent 25 minutes with the patient with telehealth technology discussing pt health issues.   I spent 15 minutes reviewing her chart before the visit.  Visit was finished at 4:07 PM.    Medication Adjustments/Labs and Tests Ordered: Current medicines are reviewed at length with the patient today.  Concerns regarding  medicines are outlined above.  No orders of the defined types were placed in this encounter.  Medication changes: No orders of the defined types were placed in this encounter.    Disposition: Follow-up physical in the office next week  Signed, Park Liter, MD, [facility] 01/28/2019 9:14 AM    [place]

## 2019-02-01 ENCOUNTER — Other Ambulatory Visit (HOSPITAL_COMMUNITY)
Admission: RE | Admit: 2019-02-01 | Discharge: 2019-02-01 | Disposition: A | Discharge status: Home / Self Care | Payer: Medicare Other | Source: Ambulatory Visit | Attending: Gastroenterology | Admitting: Gastroenterology

## 2019-02-01 ENCOUNTER — Other Ambulatory Visit: Payer: Self-pay

## 2019-02-01 ENCOUNTER — Ambulatory Visit (INDEPENDENT_AMBULATORY_CARE_PROVIDER_SITE_OTHER): Payer: Medicare Other | Admitting: Gastroenterology

## 2019-02-01 ENCOUNTER — Telehealth: Payer: Self-pay | Admitting: Gastroenterology

## 2019-02-01 DIAGNOSIS — Z1159 Encounter for screening for other viral diseases: Secondary | ICD-10-CM | POA: Diagnosis not present

## 2019-02-01 DIAGNOSIS — Z5329 Procedure and treatment not carried out because of patient's decision for other reasons: Secondary | ICD-10-CM

## 2019-02-01 DIAGNOSIS — D693 Immune thrombocytopenic purpura: Secondary | ICD-10-CM | POA: Diagnosis not present

## 2019-02-01 DIAGNOSIS — Z01812 Encounter for preprocedural laboratory examination: Secondary | ICD-10-CM | POA: Insufficient documentation

## 2019-02-01 NOTE — Progress Notes (Signed)
I called his phone, it was a business line.  I did not leave a message.  I called his home phone.  Someone answered and hung up immediately.

## 2019-02-01 NOTE — Telephone Encounter (Signed)
Dr Ardis Hughs, I just spoke with Ty Hilts Jr.patient's son. I informed him of all the details of the patients upcoming EGD at [facility] this Thursday. He voiced understanding. Patient had his covid-test done today,and told him we will contact him with the results.  Thanks

## 2019-02-01 NOTE — Telephone Encounter (Signed)
His son, Pasha Broad., called my cell this afternoon.  He apologized for has father not answering the phone earlier for his telemedicine visit.  Claiborne Billings, Can you call his son, Abdulaziz Toman, to make sure they know the details of this Thursday [facility] EGD appt (follow up for recent food impaction)?  The son's number is [phone].    thanks

## 2019-02-02 LAB — NOVEL CORONAVIRUS, NAA (HOSP ORDER, SEND-OUT TO REF LAB; TAT 18-24 HRS): SARS-CoV-2, NAA: NOT DETECTED

## 2019-02-03 ENCOUNTER — Encounter (HOSPITAL_COMMUNITY): Payer: Self-pay | Admitting: *Deleted

## 2019-02-03 ENCOUNTER — Ambulatory Visit (INDEPENDENT_AMBULATORY_CARE_PROVIDER_SITE_OTHER): Payer: Medicare Other | Admitting: Cardiology

## 2019-02-03 ENCOUNTER — Encounter: Payer: Self-pay | Admitting: Cardiology

## 2019-02-03 ENCOUNTER — Other Ambulatory Visit: Payer: Self-pay

## 2019-02-03 VITALS — BP 140/60 | HR 81 | Wt 195.6 lb

## 2019-02-03 DIAGNOSIS — N186 End stage renal disease: Secondary | ICD-10-CM | POA: Diagnosis not present

## 2019-02-03 DIAGNOSIS — I739 Peripheral vascular disease, unspecified: Secondary | ICD-10-CM | POA: Diagnosis not present

## 2019-02-03 DIAGNOSIS — I480 Paroxysmal atrial fibrillation: Secondary | ICD-10-CM

## 2019-02-03 DIAGNOSIS — Z992 Dependence on renal dialysis: Secondary | ICD-10-CM

## 2019-02-03 DIAGNOSIS — I519 Heart disease, unspecified: Secondary | ICD-10-CM

## 2019-02-03 DIAGNOSIS — D696 Thrombocytopenia, unspecified: Secondary | ICD-10-CM

## 2019-02-03 DIAGNOSIS — I5189 Other ill-defined heart diseases: Secondary | ICD-10-CM

## 2019-02-03 DIAGNOSIS — R1310 Dysphagia, unspecified: Secondary | ICD-10-CM

## 2019-02-03 MED ORDER — AMIODARONE HCL 200 MG PO TABS: 200.0000 mg | ORAL_TABLET | Freq: Two times a day (BID) | ORAL | 1 refills | Status: DC

## 2019-02-03 NOTE — Progress Notes (Signed)
Cardiology Office Note:    Date:  02/03/2019   ID:  Larry Klein, DOB 05-16-1956, MRN [id number]  PCP:  Patient, No Pcp Per  Cardiologist:  Jenne Campus, MD    Referring MD: No ref. provider found   Chief Complaint  Patient presents with  . Follow-up  Doing fair  History of Present Illness:    Larry Klein is a 63 y.o. male with multiple medical problems that include cardiomyopathy, coronary artery disease, end-stage renal disease on dialysis, peripheral vascular disease, paroxysmal atrial fibrillation I talked to him over the video visit last week however I decided to bring him to the office because of complexity of his problems.  Overall he is doing fair he goes on dialysis 3 times a week denies have any chest pain tightness squeezing pressure burning chest there is not much shortness of breath he got 2 major complaints 1 is the fact that his legs are very weak and he is falling he is not passing out he simply falling down.  Also complained of having tremor in his hands this is something he did not have before.  Denies having any palpitations no chest pain no much exertional shortness of breath.  Past Medical History:  Diagnosis Date  . COPD (chronic obstructive pulmonary disease) ([place])   . Coronary artery disease   . Dyspnea   . High cholesterol   . Hypertension   . Peripheral vascular disease ([place])   . Renal disorder     Past Surgical History:  Procedure Laterality Date  . AV FISTULA PLACEMENT Left 01/11/2019   Procedure: Creation Brachiocephalic Arteriovenous Fistula;  Surgeon: Elam Dutch, MD;  Location: [facility];  Service: Vascular;  Laterality: Left;  . CARDIAC SURGERY    . CHOLECYSTECTOMY    . CORONARY ARTERY BYPASS GRAFT    . ESOPHAGOGASTRODUODENOSCOPY (EGD) WITH PROPOFOL N/A 01/13/2019   Procedure: ESOPHAGOGASTRODUODENOSCOPY (EGD) WITH PROPOFOL;  Surgeon: Milus Banister, MD;  Location: [facility] ENDOSCOPY;  Service: Endoscopy;  Laterality: N/A;  . FOREIGN BODY  REMOVAL  01/13/2019   Procedure: FOREIGN BODY REMOVAL;  Surgeon: Milus Banister, MD;  Location: [facility];  Service: Endoscopy;;  . LOWER EXTREMITY ANGIOGRAPHY Bilateral 01/08/2019   Procedure: LOWER EXTREMITY ANGIOGRAPHY;  Surgeon: [name], MD;  Location: [place] [facility] CV LAB;  Service: Cardiovascular;  Laterality: Bilateral;  . RIGHT/LEFT HEART CATH AND CORONARY/GRAFT ANGIOGRAPHY N/A 01/07/2019   Procedure: RIGHT/LEFT HEART CATH AND CORONARY/GRAFT ANGIOGRAPHY;  Surgeon: Lorretta Harp, MD;  Location: [facility] CV LAB;  Service: Cardiovascular;  Laterality: N/A;  . THROMBECTOMY W/ EMBOLECTOMY Left 09/18/2018   Procedure: ARTERIOVENOUS FISTULA LEFT ARM;  Surgeon: Rosetta Posner, MD;  Location: [facility] OR;  Service: Vascular;  Laterality: Left;    Current Medications: Current Meds  Medication Sig  . acetaminophen (TYLENOL) 500 MG tablet Take 1,000 mg by mouth every 6 (six) hours as needed for mild pain or headache.   . albuterol (PROVENTIL HFA;VENTOLIN HFA) 108 (90 Base) MCG/ACT inhaler Inhale 2 puffs into the lungs every 6 (six) hours as needed for wheezing.  Marland Kitchen amiodarone (PACERONE) 200 MG tablet Take 1 tablet (200 mg total) by mouth daily.  Marland Kitchen aspirin EC 81 MG EC tablet Take 1 tablet (81 mg total) by mouth daily.  Marland Kitchen atorvastatin (LIPITOR) 40 MG tablet Take 1 tablet (40 mg total) by mouth at bedtime.  . bismuth subsalicylate (PEPTO BISMOL) 262 MG/15ML suspension Take 30 mLs by mouth every 6 (six) hours as needed for indigestion.  Marland Kitchen  calcium carbonate (TUMS - DOSED IN MG ELEMENTAL CALCIUM) 500 MG chewable tablet Chew 2 tablets by mouth 3 (three) times daily before meals.  . cefpodoxime (VANTIN) 200 MG tablet Take 1 tab on Tuesday, Thursday, Saturday after HD  . diazepam (VALIUM) 10 MG tablet Take 1 tablet (10 mg total) by mouth at bedtime as needed for muscle spasms (leg pain).  Marland Kitchen gabapentin (NEURONTIN) 600 MG tablet Take 0.5 tablets (300 mg total) by mouth at bedtime.  Marland Kitchen guaiFENesin  (MUCINEX) 600 MG 12 hr tablet Take 1 tablet (600 mg total) by mouth 2 (two) times daily as needed for cough or to loosen phlegm.  Marland Kitchen HYDROcodone-acetaminophen (NORCO/VICODIN) 5-325 MG tablet Take 1 tablet by mouth every 6 (six) hours as needed for moderate pain or severe pain.  Marland Kitchen ipratropium-albuterol (DUONEB) 0.5-2.5 (3) MG/3ML SOLN Take 3 mLs by nebulization every 6 (six) hours as needed (wheezing).  . metoprolol succinate (TOPROL-XL) 25 MG 24 hr tablet Take 1 tablet (25 mg total) by mouth daily. Take with or immediately following a meal.  . montelukast (SINGULAIR) 10 MG tablet Take 10 mg by mouth daily.   . Multiple Vitamin (MULTIVITAMIN WITH MINERALS) TABS tablet Take 1 tablet by mouth daily.  . pantoprazole (PROTONIX) 40 MG tablet Take 1 tablet (40 mg total) by mouth daily before breakfast.  . predniSONE (DELTASONE) 20 MG tablet Take 1 tablet (20 mg total) by mouth daily with breakfast. (Patient taking differently: Take 15 mg by mouth daily with breakfast. )  . predniSONE (DELTASONE) 5 MG tablet Take 15 mg by mouth daily with breakfast.  . rOPINIRole (REQUIP) 0.5 MG tablet Take 0.5 mg by mouth at bedtime.  Marland Kitchen umeclidinium-vilanterol (ANORO ELLIPTA) 62.5-25 MCG/INH AEPB Inhale 1 puff into the lungs daily.     Allergies:   Patient has no known allergies.   Social History   Socioeconomic History  . Marital status: Divorced    Spouse name: Not on file  . Number of children: 3  . Years of education: Not on file  . Highest education level: Not on file  Occupational History  . Not on file  Social Needs  . Financial resource strain: Not on file  . Food insecurity:    Worry: Not on file    Inability: Not on file  . Transportation needs:    Medical: Not on file    Non-medical: Not on file  Tobacco Use  . Smoking status: Former Smoker    Packs/day: 1.00    Years: 50.00    Pack years: 50.00    Last attempt to quit: 09/18/2015    Years since quitting: 3.3  . Smokeless tobacco: Never Used   Substance and Sexual Activity  . Alcohol use: Never    Frequency: Never  . Drug use: Never  . Sexual activity: Not Currently  Lifestyle  . Physical activity:    Days per week: Not on file    Minutes per session: Not on file  . Stress: Not on file  Relationships  . Social connections:    Talks on phone: Not on file    Gets together: Not on file    Attends religious service: Not on file    Active member of club or organization: Not on file    Attends meetings of clubs or organizations: Not on file    Relationship status: Not on file  Other Topics Concern  . Not on file  Social History Narrative  . Not on file  Family History: The patient's family history includes Heart disease in his father and mother. ROS:   Please see the history of present illness.    All 14 point review of systems negative except as described per history of present illness  EKGs/Labs/Other Studies Reviewed:      Recent Labs: 12/07/2018: B Natriuretic Peptide 1,637.3 12/24/2018: TSH 2.734 12/28/2018: Magnesium 2.1 01/15/2019: ALT 10 01/21/2019: BUN 65; Creatinine, Ser 7.47; Hemoglobin 10.6; Platelets 85; Potassium 4.5; Sodium 137  Recent Lipid Panel    Component Value Date/Time   CHOL 125 12/09/2018 0311   TRIG 90 12/09/2018 0311   HDL 44 12/09/2018 0311   CHOLHDL 2.8 12/09/2018 0311   VLDL 18 12/09/2018 0311   LDLCALC 63 12/09/2018 0311    Physical Exam:    VS:  BP 140/60   Pulse 81   Wt 195 lb 9.6 oz (88.7 kg)   SpO2 (!) 87%   BMI 27.28 kg/m     Wt Readings from Last 3 Encounters:  02/03/19 195 lb 9.6 oz (88.7 kg)  01/27/19 195 lb 1.7 oz (88.5 kg)  01/21/19 193 lb 5.5 oz (87.7 kg)     GEN:  Well nourished, well developed in no acute distress HEENT: Normal NECK: No JVD; No carotid bruits LYMPHATICS: No lymphadenopathy CARDIAC: Irregularly irregular, not tachycardic, systolic murmur grade 2/6 best heard left border sternum, no rubs, no gallops RESPIRATORY:  Clear to auscultation  without rales, wheezing or rhonchi  ABDOMEN: Soft, non-tender, non-distended MUSCULOSKELETAL:  No edema; No deformity  SKIN: Warm and dry LOWER EXTREMITIES: no swelling NEUROLOGIC:  Alert and oriented x 3 PSYCHIATRIC:  Normal affect  Double-lumen catheter in left subclavian vein noted.  Thoracic is clean.  He also got shunt in the left arm which seems to be functioning properly a ASSESSMENT:    1. Severe left ventricular systolic dysfunction   2. PVD (peripheral vascular disease) ([facility])   3. Paroxysmal atrial fibrillation ([place])   4. ESRD (end stage renal disease) on dialysis ([place])   5. Dysphagia, unspecified type   6. Thrombocytopenia ([place])    PLAN:    In order of problems listed above:  1. Severe left ventricular dysfunction the problem is his blood pressure being low we measured 110/60 on top of that he described episode of falls and those happening when he gets up he will get up and try to walk and then he will fell down I see 2 potential explanation for this phenomenon one is the fact that his blood pressure drops and he is orthostatic to simply problem with balance because of multiple medical problems.  We talked in length about what can be used for the situation and honestly does not much left that we can utilize.  He is already on small dose of long-acting metoprolol which I will continue.  However adding vasodilatation can make the situation worse therefore I will not put him on Imdur and I will not put him on hydralazine today. 2. Peripheral vascular study reviewed.  He is followed by vascular surgery in [place]. 3. Paroxysmal atrial fibrillation EKG done today show presence of atrial fibrillation with controlled ventricular rate of 88.  He does have a right bundle branch block and nonspecific ST-T segment changes.  He is not anticoagulated because of severe anemia that required blood transfusion and thrombocytopenia.  He visited his oncologist/hematologist on Monday and had  discussion about it his platelets are still low.  I will call hospital and get report of his last  CBC.  I asked him to increase the dose of amiodarone to 200 mg twice daily hopefully I will be able to maintain his sinus rhythm however we may not succeed with this approach.  On top of the trying to actively convert somebody without anticoagulation can lead to CVA which obviously would be devastating. 4. End-stage renal disease on dialysis which will continue. 5. Dysphagia.  Tomorrow he scheduled to have esophageal stretch done. 6. Thrombocytopenia will call hematologist to get last CBC.  Overall he is a very sick gentleman with multiple medical problems advanced coronary artery disease with was not amendable for intervention, severely diminished left ventricular ejection fraction with very limited options in terms of medical therapy paroxysmal atrial fibrillation not anticoagulated because of thrombocytopenia a lot of problems here.  We will try to manage him the best we can.  Remove dressing that he have in the left elbow that was skin abrasion after he fell down.  That area looks fine.  It was redressed.   Medication Adjustments/Labs and Tests Ordered: Current medicines are reviewed at length with the patient today.  Concerns regarding medicines are outlined above.  No orders of the defined types were placed in this encounter.  Medication changes: No orders of the defined types were placed in this encounter.   Signed, Park Liter, MD, [facility] 02/03/2019 2:15 PM    [place]

## 2019-02-03 NOTE — Addendum Note (Signed)
Addended by: Ashok Norris on: 02/03/2019 02:34 PM   Modules accepted: Orders

## 2019-02-03 NOTE — Anesthesia Preprocedure Evaluation (Addendum)
Anesthesia Evaluation  Patient identified by MRN, date of birth, ID band Patient awake    Reviewed: Allergy & Precautions, NPO status , Patient's Chart, lab work & pertinent test results, reviewed documented beta blocker date and time   History of Anesthesia Complications Negative for: history of anesthetic complications  Airway Mallampati: II  TM Distance: >3 FB Neck ROM: Full    Dental  (+) Upper Dentures, Edentulous Lower   Pulmonary shortness of breath, COPD,  COPD inhaler, former smoker,    Pulmonary exam normal        Cardiovascular hypertension, Pt. on home beta blockers and Pt. on medications + CAD, + CABG (2017) and + Peripheral Vascular Disease  + dysrhythmias (on amiodarone) Atrial Fibrillation  Rhythm:Irregular Rate:Normal  EF 20-25%, moderately dilated LV with diffuse hypokinesis, mildly reduced RV function with mild enlargement, moderate LAE, moderate RAE, moderate/severe MVR   Neuro/Psych negative neurological ROS     GI/Hepatic negative GI ROS, Neg liver ROS,   Endo/Other  negative endocrine ROS  Renal/GU ESRF and DialysisRenal disease     Musculoskeletal negative musculoskeletal ROS (+)   Abdominal   Peds  Hematology negative hematology ROS (+)   Anesthesia Other Findings Day of surgery medications reviewed with the patient.  Reproductive/Obstetrics                           Anesthesia Physical Anesthesia Plan  ASA: IV  Anesthesia Plan: MAC   Post-op Pain Management:    Induction:   PONV Risk Score and Plan: Treatment may vary due to age or medical condition and Propofol infusion  Airway Management Planned: Natural Airway and Nasal Cannula  Additional Equipment: None  Intra-op Plan:   Post-operative Plan:   Informed Consent: I have reviewed the patients History and Physical, chart, labs and discussed the procedure including the risks, benefits and alternatives  for the proposed anesthesia with the patient or authorized representative who has indicated his/her understanding and acceptance.     Dental advisory given  Plan Discussed with: CRNA  Anesthesia Plan Comments: (Please have ketamine available in room.)      Anesthesia Quick Evaluation

## 2019-02-03 NOTE — Patient Instructions (Addendum)
Medication Instructions:  Your physician has recommended you make the following change in your medication:   Increase: Amiodarone to 200 mg twice daily   If you need a refill on your cardiac medications before your next appointment, please call your pharmacy.   Lab work: None.  If you have labs (blood work) drawn today and your tests are completely normal, you will receive your results only by: Marland Kitchen MyChart Message (if you have MyChart) OR . A paper copy in the mail If you have any lab test that is abnormal or we need to change your treatment, we will call you to review the results.  Testing/Procedures: None.  Follow-Up: At [facility], you and your health needs are our priority.  As part of our continuing mission to provide you with exceptional heart care, we have created designated Provider Care Teams.  These Care Teams include your primary Cardiologist (physician) and Advanced Practice Providers (APPs -  Physician Assistants and Nurse Practitioners) who all work together to provide you with the care you need, when you need it. You will need a follow up appointment in 1 weeks.  Please call our office 2 months in advance to schedule this appointment.  You may see Jenne Campus, MD or another member of our [place] Provider Team in [place]: Shirlee More, MD . Jyl Heinz, MD  Any Other Special Instructions Will Be Listed Below (If Applicable).

## 2019-02-04 ENCOUNTER — Ambulatory Visit (HOSPITAL_COMMUNITY): Payer: Medicare Other | Admitting: Anesthesiology

## 2019-02-04 ENCOUNTER — Encounter (HOSPITAL_COMMUNITY)
Admission: RE | Disposition: A | Discharge status: Home / Self Care | Payer: Self-pay | Source: Home / Self Care | Attending: Gastroenterology

## 2019-02-04 ENCOUNTER — Encounter (HOSPITAL_COMMUNITY): Payer: Self-pay | Admitting: *Deleted

## 2019-02-04 ENCOUNTER — Ambulatory Visit (HOSPITAL_COMMUNITY)
Admission: RE | Admit: 2019-02-04 | Discharge: 2019-02-04 | Disposition: A | Discharge status: Home / Self Care | Payer: Medicare Other | Attending: Gastroenterology | Admitting: Gastroenterology

## 2019-02-04 DIAGNOSIS — T18128A Food in esophagus causing other injury, initial encounter: Secondary | ICD-10-CM | POA: Diagnosis not present

## 2019-02-04 DIAGNOSIS — Z79899 Other long term (current) drug therapy: Secondary | ICD-10-CM | POA: Insufficient documentation

## 2019-02-04 DIAGNOSIS — R1310 Dysphagia, unspecified: Secondary | ICD-10-CM | POA: Diagnosis not present

## 2019-02-04 DIAGNOSIS — J440 Chronic obstructive pulmonary disease with acute lower respiratory infection: Secondary | ICD-10-CM | POA: Insufficient documentation

## 2019-02-04 DIAGNOSIS — I132 Hypertensive heart and chronic kidney disease with heart failure and with stage 5 chronic kidney disease, or end stage renal disease: Secondary | ICD-10-CM | POA: Diagnosis not present

## 2019-02-04 DIAGNOSIS — K2970 Gastritis, unspecified, without bleeding: Secondary | ICD-10-CM

## 2019-02-04 DIAGNOSIS — K2950 Unspecified chronic gastritis without bleeding: Secondary | ICD-10-CM | POA: Diagnosis not present

## 2019-02-04 DIAGNOSIS — R079 Chest pain, unspecified: Secondary | ICD-10-CM | POA: Insufficient documentation

## 2019-02-04 DIAGNOSIS — K259 Gastric ulcer, unspecified as acute or chronic, without hemorrhage or perforation: Secondary | ICD-10-CM | POA: Insufficient documentation

## 2019-02-04 DIAGNOSIS — J690 Pneumonitis due to inhalation of food and vomit: Secondary | ICD-10-CM | POA: Insufficient documentation

## 2019-02-04 DIAGNOSIS — Z992 Dependence on renal dialysis: Secondary | ICD-10-CM | POA: Diagnosis not present

## 2019-02-04 DIAGNOSIS — K219 Gastro-esophageal reflux disease without esophagitis: Secondary | ICD-10-CM | POA: Diagnosis not present

## 2019-02-04 DIAGNOSIS — X58XXXA Exposure to other specified factors, initial encounter: Secondary | ICD-10-CM | POA: Diagnosis not present

## 2019-02-04 DIAGNOSIS — M79604 Pain in right leg: Secondary | ICD-10-CM | POA: Insufficient documentation

## 2019-02-04 DIAGNOSIS — I739 Peripheral vascular disease, unspecified: Secondary | ICD-10-CM | POA: Insufficient documentation

## 2019-02-04 DIAGNOSIS — D696 Thrombocytopenia, unspecified: Secondary | ICD-10-CM | POA: Diagnosis not present

## 2019-02-04 DIAGNOSIS — K2990 Gastroduodenitis, unspecified, without bleeding: Secondary | ICD-10-CM

## 2019-02-04 DIAGNOSIS — I5023 Acute on chronic systolic (congestive) heart failure: Secondary | ICD-10-CM | POA: Diagnosis not present

## 2019-02-04 DIAGNOSIS — R2242 Localized swelling, mass and lump, left lower limb: Secondary | ICD-10-CM | POA: Diagnosis not present

## 2019-02-04 DIAGNOSIS — Z87891 Personal history of nicotine dependence: Secondary | ICD-10-CM | POA: Diagnosis not present

## 2019-02-04 DIAGNOSIS — Z7982 Long term (current) use of aspirin: Secondary | ICD-10-CM | POA: Insufficient documentation

## 2019-02-04 DIAGNOSIS — Z7952 Long term (current) use of systemic steroids: Secondary | ICD-10-CM | POA: Insufficient documentation

## 2019-02-04 DIAGNOSIS — I2510 Atherosclerotic heart disease of native coronary artery without angina pectoris: Secondary | ICD-10-CM | POA: Diagnosis not present

## 2019-02-04 DIAGNOSIS — I4821 Permanent atrial fibrillation: Secondary | ICD-10-CM | POA: Insufficient documentation

## 2019-02-04 DIAGNOSIS — K7689 Other specified diseases of liver: Secondary | ICD-10-CM | POA: Insufficient documentation

## 2019-02-04 DIAGNOSIS — N186 End stage renal disease: Secondary | ICD-10-CM | POA: Diagnosis not present

## 2019-02-04 HISTORY — PX: BIOPSY: SHX5522

## 2019-02-04 HISTORY — PX: ESOPHAGOGASTRODUODENOSCOPY (EGD) WITH PROPOFOL: SHX5813

## 2019-02-04 SURGERY — ESOPHAGOGASTRODUODENOSCOPY (EGD) WITH PROPOFOL
Anesthesia: Monitor Anesthesia Care

## 2019-02-04 MED ORDER — OMEPRAZOLE 40 MG PO CPDR: 40.0000 mg | DELAYED_RELEASE_CAPSULE | Freq: Two times a day (BID) | ORAL | 6 refills | Status: AC

## 2019-02-04 MED ORDER — PROPOFOL 500 MG/50ML IV EMUL
INTRAVENOUS | Status: DC | PRN
  Administered 2019-02-04: 100 ug/kg/min via INTRAVENOUS

## 2019-02-04 MED ORDER — SODIUM CHLORIDE 0.9 % IV SOLN
INTRAVENOUS | Status: DC
  Administered 2019-02-04: 07:00:00 via INTRAVENOUS
  Administered 2019-02-04: 500 mL via INTRAVENOUS

## 2019-02-04 MED ORDER — LIDOCAINE 2% (20 MG/ML) 5 ML SYRINGE
INTRAMUSCULAR | Status: DC | PRN
  Administered 2019-02-04: 60 mg via INTRAVENOUS

## 2019-02-04 MED ORDER — PROPOFOL 10 MG/ML IV BOLUS
INTRAVENOUS | Status: DC | PRN
  Administered 2019-02-04: 20 mg via INTRAVENOUS
  Administered 2019-02-04: 10 mg via INTRAVENOUS
  Administered 2019-02-04: 20 mg via INTRAVENOUS

## 2019-02-04 MED ORDER — PROPOFOL 10 MG/ML IV BOLUS
INTRAVENOUS | Status: AC
  Filled 2019-02-04: qty 60

## 2019-02-04 MED ORDER — PHENYLEPHRINE 40 MCG/ML (10ML) SYRINGE FOR IV PUSH (FOR BLOOD PRESSURE SUPPORT)
PREFILLED_SYRINGE | INTRAVENOUS | Status: DC | PRN
  Administered 2019-02-04 (×2): 80 ug via INTRAVENOUS
  Administered 2019-02-04: 120 ug via INTRAVENOUS

## 2019-02-04 MED ORDER — KETAMINE HCL 10 MG/ML IJ SOLN
INTRAMUSCULAR | Status: DC | PRN
  Administered 2019-02-04: 20 mg via INTRAVENOUS

## 2019-02-04 MED ORDER — KETAMINE HCL 10 MG/ML IJ SOLN
INTRAMUSCULAR | Status: AC
  Filled 2019-02-04: qty 1

## 2019-02-04 SURGICAL SUPPLY — 15 items

## 2019-02-04 NOTE — Discharge Instructions (Signed)
YOU HAD AN ENDOSCOPIC PROCEDURE TODAY: Refer to the procedure report and other information in the discharge instructions given to you for any specific questions about what was found during the examination. If this information does not answer your questions, please call [place] office at [phone] to clarify.   YOU SHOULD EXPECT: Some feelings of bloating in the abdomen. Passage of more gas than usual. Walking can help get rid of the air that was put into your GI tract during the procedure and reduce the bloating. If you had a lower endoscopy (such as a colonoscopy or flexible sigmoidoscopy) you may notice spotting of blood in your stool or on the toilet paper. Some abdominal soreness may be present for a day or two, also.  DIET: Your first meal following the procedure should be a light meal and then it is ok to progress to your normal diet. A half-sandwich or bowl of soup is an example of a good first meal. Heavy or fried foods are harder to digest and may make you feel nauseous or bloated. Drink plenty of fluids but you should avoid alcoholic beverages for 24 hours. If you had a esophageal dilation, please see attached instructions for diet.    ACTIVITY: Your care partner should take you home directly after the procedure. You should plan to take it easy, moving slowly for the rest of the day. You can resume normal activity the day after the procedure however YOU SHOULD NOT DRIVE, use power tools, machinery or perform tasks that involve climbing or major physical exertion for 24 hours (because of the sedation medicines used during the test).   SYMPTOMS TO REPORT IMMEDIATELY: A gastroenterologist can be reached at any hour. Please call [phone]  for any of the following symptoms:   Following upper endoscopy (EGD, EUS, ERCP, esophageal dilation) Vomiting of blood or coffee ground material  New, significant abdominal pain  New, significant chest pain or pain under the shoulder blades  Painful or  persistently difficult swallowing  New shortness of breath  Black, tarry-looking or red, bloody stools  FOLLOW UP:  If any biopsies were taken you will be contacted by phone or by letter within the next 1-3 weeks. Call [phone]  if you have not heard about the biopsies in 3 weeks.  Please also call with any specific questions about appointments or follow up tests.  

## 2019-02-04 NOTE — Interval H&P Note (Signed)
History and Physical Interval Note:  02/04/2019 7:09 AM  Larry Klein  has presented today for surgery, with the diagnosis of dilation- dysphagia.  The various methods of treatment have been discussed with the patient and family. After consideration of risks, benefits and other options for treatment, the patient has consented to  Procedure(s): ESOPHAGOGASTRODUODENOSCOPY (EGD) WITH PROPOFOL (N/A) BALLOON DILATION (N/A) as a surgical intervention.  The patient's history has been reviewed, patient examined, no change in status, stable for surgery.  I have reviewed the patient's chart and labs.  Questions were answered to the patient's satisfaction.     Milus Banister

## 2019-02-04 NOTE — Anesthesia Procedure Notes (Signed)
Procedure Name: MAC Date/Time: 02/04/2019 7:42 AM Performed by: Eben Burow, CRNA Pre-anesthesia Checklist: Patient identified, Emergency Drugs available, Suction available, Patient being monitored and Timeout performed Oxygen Delivery Method: Nasal cannula Dental Injury: Teeth and Oropharynx as per pre-operative assessment

## 2019-02-04 NOTE — Anesthesia Postprocedure Evaluation (Signed)
Anesthesia Post Note  Patient: Larry Klein  Procedure(s) Performed: ESOPHAGOGASTRODUODENOSCOPY (EGD) WITH PROPOFOL (N/A ) BIOPSY     Patient location during evaluation: PACU Anesthesia Type: MAC Level of consciousness: awake and alert Pain management: pain level controlled Vital Signs Assessment: post-procedure vital signs reviewed and stable Respiratory status: spontaneous breathing, nonlabored ventilation and respiratory function stable Cardiovascular status: blood pressure returned to baseline and stable Postop Assessment: no apparent nausea or vomiting Anesthetic complications: no    Last Vitals:  Vitals:   02/04/19 0850 02/04/19 0900  BP: 115/76 128/64  Pulse: 71 (!) 44  Resp: (!) 23 20  Temp:    SpO2: 98% 98%    Last Pain:  Vitals:   02/04/19 0814  TempSrc: Oral  PainSc: [place]

## 2019-02-04 NOTE — Op Note (Signed)
[facility] Patient Name: Larry Klein Procedure Date: 02/04/2019 MRN: [id number] Attending MD: Milus Banister , MD Date of Birth: 12/27/1955 CSN: [id number] Age: 63 Admit Type: Outpatient Procedure:                Upper GI endoscopy Indications:              Dysphagia; esophageal food impaction last month                            while hospitalized for severe cardiac disease Providers:                Milus Banister, MD, Cleda Daub, RN, Marguerita Merles, Technician, Cherylynn Ridges, Referring MD:              Medicines:                Monitored Anesthesia Care Complications:            No immediate complications. Estimated blood loss:                            None. Estimated Blood Loss:     Estimated blood loss: none. Procedure:                Pre-Anesthesia Assessment:                           - Prior to the procedure, a History and Physical                            was performed, and patient medications and                            allergies were reviewed. The patient's tolerance of                            previous anesthesia was also reviewed. The risks                            and benefits of the procedure and the sedation                            options and risks were discussed with the patient.                            All questions were answered, and informed consent                            was obtained. Prior Anticoagulants: The patient has                            taken no previous anticoagulant or antiplatelet  agents. ASA Grade Assessment: IV - A patient with                            severe systemic disease that is a constant threat                            to life. After reviewing the risks and benefits,                            the patient was deemed in satisfactory condition to                            undergo the procedure.                           After obtaining  informed consent, the endoscope was                            passed under direct vision. Throughout the                            procedure, the patient's blood pressure, pulse, and                            oxygen saturations were monitored continuously. The                            GIF-H190 ([id number] Olympus gastroscope was                            introduced through the mouth, and advanced to the                            second part of duodenum. The upper GI endoscopy was                            accomplished without difficulty. The patient                            tolerated the procedure well. Scope In: Scope Out: Findings:      The esophagus was normal (no strictures or stenosis).      There was a large amount of solid food in his stomach.      One non-bleeding cratered gastric ulcer with no stigmata of bleeding was       found in the pyloric channel. The edges of the ulcer were friable and       not overtly neoplastic appearing. The ulcer was 7 mm in largest       dimension. Biopsies were taken with a cold forceps for histology. Mild,       non-specific distal gastritis was noted and biopsied.      The exam was otherwise without abnormality. Impression:               - Clean based but friable pyloric channel ulcer.                           -  Distal non-specific gastritis, biopsied to check                            for H. pylori                           - Large amount of retained solid food in the                            stomach (perhaps related to the ulcer noted above).                           - No esophageal strictures or stenosis. Moderate Sedation:      Not Applicable - Patient had care per Anesthesia. Recommendation:           - Patient has a contact number available for                            emergencies. The signs and symptoms of potential                            delayed complications were discussed with the                            patient.  Return to normal activities tomorrow.                            Written discharge instructions were provided to the                            patient.                           - Resume previous diet.                           - Continue present medications. He was NOT on PPI                            since he left the hospital (says [facility] never                            got the prescription). I will call in twice daily                            omeprazole now. He knows to take it twice a day                            until repeat EGD in about 2 months (my office will                            schedule). He knows to absolutely, strictly avoid  NSAIDs and also to chew his food well, eat slowly                            and take small bites.                           - Await pathology results. Will start appropriate                            antibiotics if + for H. pylori. Procedure Code(s):        --- Professional ---                           [phone], Esophagogastroduodenoscopy, flexible,                            transoral; with biopsy, single or multiple Diagnosis Code(s):        --- Professional ---                           K25.9, Gastric ulcer, unspecified as acute or                            chronic, without hemorrhage or perforation                           R13.10, Dysphagia, unspecified CPT copyright 2019 American Medical Association. All rights reserved. The codes documented in this report are preliminary and upon coder review may  be revised to meet current compliance requirements. Milus Banister, MD 02/04/2019 8:08:45 AM This report has been signed electronically. Number of Addenda: 0

## 2019-02-04 NOTE — Transfer of Care (Signed)
Immediate Anesthesia Transfer of Care Note  Patient: Larry Klein  Procedure(s) Performed: ESOPHAGOGASTRODUODENOSCOPY (EGD) WITH PROPOFOL (N/A ) BIOPSY  Patient Location: PACU and Endoscopy Unit  Anesthesia Type:MAC  Level of Consciousness: drowsy  Airway & Oxygen Therapy: Patient Spontanous Breathing and Patient connected to nasal cannula oxygen  Post-op Assessment: Report given to RN and Post -op Vital signs reviewed and stable  Post vital signs: Reviewed and stable  Last Vitals:  Vitals Value Taken Time  BP 110/67 02/04/2019  8:10 AM  Temp    Pulse 63 02/04/2019  8:11 AM  Resp 13 02/04/2019  8:11 AM  SpO2 100 % 02/04/2019  8:11 AM  Vitals shown include unvalidated device data.  Last Pain:  Vitals:   02/04/19 0716  TempSrc: Oral  PainSc: 0-No pain         Complications: No apparent anesthesia complications

## 2019-02-05 ENCOUNTER — Encounter (HOSPITAL_COMMUNITY): Payer: Self-pay | Admitting: Gastroenterology

## 2019-02-15 ENCOUNTER — Other Ambulatory Visit: Payer: Self-pay

## 2019-02-15 ENCOUNTER — Ambulatory Visit (INDEPENDENT_AMBULATORY_CARE_PROVIDER_SITE_OTHER): Payer: Medicare Other | Admitting: Cardiology

## 2019-02-15 ENCOUNTER — Encounter: Payer: Self-pay | Admitting: Cardiology

## 2019-02-15 VITALS — BP 120/60 | HR 109 | Ht 71.0 in | Wt 195.8 lb

## 2019-02-15 DIAGNOSIS — I255 Ischemic cardiomyopathy: Secondary | ICD-10-CM

## 2019-02-15 DIAGNOSIS — I739 Peripheral vascular disease, unspecified: Secondary | ICD-10-CM

## 2019-02-15 DIAGNOSIS — Z992 Dependence on renal dialysis: Secondary | ICD-10-CM

## 2019-02-15 DIAGNOSIS — I519 Heart disease, unspecified: Secondary | ICD-10-CM

## 2019-02-15 DIAGNOSIS — D696 Thrombocytopenia, unspecified: Secondary | ICD-10-CM

## 2019-02-15 DIAGNOSIS — N186 End stage renal disease: Secondary | ICD-10-CM

## 2019-02-15 DIAGNOSIS — I4891 Unspecified atrial fibrillation: Secondary | ICD-10-CM | POA: Diagnosis not present

## 2019-02-15 MED ORDER — METOPROLOL SUCCINATE ER 50 MG PO TB24: 50.0000 mg | ORAL_TABLET | Freq: Every day | ORAL | 1 refills | Status: DC

## 2019-02-15 NOTE — Progress Notes (Signed)
Cardiology Office Note:    Date:  02/15/2019   ID:  Larry Klein, DOB 1956/03/05, MRN [id number]  PCP:  Maryella Shivers, MD  Cardiologist:  Jenne Campus, MD    Referring MD: No ref. provider found   No chief complaint on file. Doing    History of Present Illness:    Larry Klein is a 63 y.o. male  with multiple medical problems that include cardiomyopathy, coronary artery disease, end-stage renal disease on dialysis, peripheral vascular disease, paroxysmal atrial fibrillation I talked to him over the video visit last week however I decided to bring him to the office because of complexity of his problems.  Overall he is doing fair he goes on dialysis 3 times a week denies have any chest pain tightness squeezing pressure burning chest there is not much shortness of breath.  Comes today to my office follow-up he is ablating much better.  He stopped majority of his medication he got tired of tramadol he discontinue the more daily medication he takes his antibiotic as well as prednisone and aspirin.  We spent a great deal of time talking about this quite complex difficult situation.  He is very reluctant to start any more medications.  I was able to build to convince him to go back on metoprolol and actually I want him to take 50 mg a day.  It is very possible that his tremor was caused by amiodarone.  His EKG today showed persistent of atrial fibrillation.  Rate is 109.  Overall he is doing better he said he is feeling stronger he got more energy.  He does get his dialysis on a regular basis.  Past Medical History:  Diagnosis Date  . COPD (chronic obstructive pulmonary disease) ([place])   . Coronary artery disease   . Dyspnea   . High cholesterol   . Hypertension   . Peripheral vascular disease ([place])   . Renal disorder     Past Surgical History:  Procedure Laterality Date  . AV FISTULA PLACEMENT Left 01/11/2019   Procedure: Creation Brachiocephalic Arteriovenous Fistula;  Surgeon:  Elam Dutch, MD;  Location: [place];  Service: Vascular;  Laterality: Left;  . BIOPSY  02/04/2019   Procedure: BIOPSY;  Surgeon: Milus Banister, MD;  Location: [facility];  Service: Endoscopy;;  . CARDIAC SURGERY    . CHOLECYSTECTOMY    . CORONARY ARTERY BYPASS GRAFT    . ESOPHAGOGASTRODUODENOSCOPY (EGD) WITH PROPOFOL N/A 01/13/2019   Procedure: ESOPHAGOGASTRODUODENOSCOPY (EGD) WITH PROPOFOL;  Surgeon: Milus Banister, MD;  Location: [facility] ENDOSCOPY;  Service: Endoscopy;  Laterality: N/A;  . ESOPHAGOGASTRODUODENOSCOPY (EGD) WITH PROPOFOL N/A 02/04/2019   Procedure: ESOPHAGOGASTRODUODENOSCOPY (EGD) WITH PROPOFOL;  Surgeon: Milus Banister, MD;  Location: [facility] ENDOS[facility];  Service: Endoscopy;  Laterality: N/A;  . FOREIGN BODY REMOVAL  01/13/2019   Procedure: FOREIGN BODY REMOVAL;  Surgeon: Milus Banister, MD;  Location: [place];  Service: Endoscopy;;  . LOWER EXTREMITY ANGIOGRAPHY Bilateral 01/08/2019   Procedure: LOWER EXTREMITY ANGIOGRAPHY;  Surgeon: [name], MD;  Location: [place] CV LAB;  Service: Cardiovascular;  Laterality: Bilateral;  . RIGHT/LEFT HEART CATH AND CORONARY/GRAFT ANGIOGRAPHY N/A 01/07/2019   Procedure: RIGHT/LEFT HEART CATH AND CORONARY/GRAFT ANGIOGRAPHY;  Surgeon: Lorretta Harp, MD;  Location: [place] CV LAB;  Service: Cardiovascular;  Laterality: N/A;  . THROMBECTOMY W/ EMBOLECTOMY Left 09/18/2018   Procedure: ARTERIOVENOUS FISTULA LEFT ARM;  Surgeon: Rosetta Posner, MD;  Location: [place];  Service: Vascular;  Laterality: Left;  Current Medications: Current Meds  Medication Sig  . aspirin EC 81 MG EC tablet Take 1 tablet (81 mg total) by mouth daily.  Marland Kitchen levofloxacin (LEVAQUIN) 500 MG tablet Take 1 tablet by mouth 2 (two) times daily.  . predniSONE (DELTASONE) 20 MG tablet Take 1 tablet (20 mg total) by mouth daily with breakfast. (Patient taking differently: Take 15 mg by mouth daily with breakfast. )     Allergies:   Patient has no known  allergies.   Social History   Socioeconomic History  . Marital status: Divorced    Spouse name: Not on file  . Number of children: 3  . Years of education: Not on file  . Highest education level: Not on file  Occupational History  . Not on file  Social Needs  . Financial resource strain: Not on file  . Food insecurity:    Worry: Not on file    Inability: Not on file  . Transportation needs:    Medical: Not on file    Non-medical: Not on file  Tobacco Use  . Smoking status: Former Smoker    Packs/day: 1.00    Years: 50.00    Pack years: 50.00    Last attempt to quit: 09/18/2015    Years since quitting: 3.4  . Smokeless tobacco: Never Used  Substance and Sexual Activity  . Alcohol use: Never    Frequency: Never  . Drug use: Never  . Sexual activity: Not Currently  Lifestyle  . Physical activity:    Days per week: Not on file    Minutes per session: Not on file  . Stress: Not on file  Relationships  . Social connections:    Talks on phone: Not on file    Gets together: Not on file    Attends religious service: Not on file    Active member of club or organization: Not on file    Attends meetings of clubs or organizations: Not on file    Relationship status: Not on file  Other Topics Concern  . Not on file  Social History Narrative  . Not on file     Family History: The patient's family history includes Heart disease in his father and mother. ROS:   Please see the history of present illness.    All 14 point review of systems negative except as described per history of present illness  EKGs/Labs/Other Studies Reviewed:      Recent Labs: 12/07/2018: B Natriuretic Peptide 1,637.3 12/24/2018: TSH 2.734 12/28/2018: Magnesium 2.1 01/15/2019: ALT 10 01/21/2019: BUN 65; Creatinine, Ser 7.47; Hemoglobin 10.6; Platelets 85; Potassium 4.5; Sodium 137  Recent Lipid Panel    Component Value Date/Time   CHOL 125 12/09/2018 0311   TRIG 90 12/09/2018 0311   HDL 44 12/09/2018  0311   CHOLHDL 2.8 12/09/2018 0311   VLDL 18 12/09/2018 0311   LDLCALC 63 12/09/2018 0311    Physical Exam:    VS:  BP 120/60   Pulse (!) 109   Ht 5\' 11"  (1.803 m)   Wt 195 lb 12.8 oz (88.8 kg)   SpO2 98%   BMI 27.31 kg/m     Wt Readings from Last 3 Encounters:  02/15/19 195 lb 12.8 oz (88.8 kg)  02/03/19 195 lb 9.6 oz (88.7 kg)  01/27/19 195 lb 1.7 oz (88.5 kg)     GEN:  Well nourished, well developed in no acute distress HEENT: Normal NECK: No JVD; No carotid bruits LYMPHATICS: No lymphadenopathy CARDIAC: RRR,  no murmurs, no rubs, no gallops RESPIRATORY:  Clear to auscultation without rales, wheezing or rhonchi  ABDOMEN: Soft, non-tender, non-distended MUSCULOSKELETAL:  No edema; No deformity  SKIN: Warm and dry LOWER EXTREMITIES: no swelling NEUROLOGIC:  Alert and oriented x 3 PSYCHIATRIC:  Normal affect   ASSESSMENT:    1. Severe left ventricular systolic dysfunction   2. PVD (peripheral vascular disease) ([place])   3. Ischemic cardiomyopathy   4. Atrial fibrillation with RVR ([place])   5. Thrombocytopenia ([place])   6. ESRD (end stage renal disease) on dialysis [facility])    PLAN:    In order of problems listed above:  1. Severely diminished left ventricular ejection fraction will initiate beta-blocker.  In the future will consider vasodilatation.  Obviously obstacle may be low blood pressure but for now the biggest obstacle appears to be compliance.  I would like to see him back in my office in about 10 days to see if he is ready to push to add some additional medications.  For now I will start only Toprol-XL 50 mg daily. 2. Refill vascular disease stable. 3. Ischemic cardiomyopathy problem with tolerating medications. 4. Ablation was slightly accelerated ventricular rate will initiate beta-blocker hopefully he will feel better with it not anticoagulated because of multiple comorbidities as well as significant thrombocytopenia 5. Thrombocytopenia followed by hematology  service 6. End-stage renal disease on dialysis   Medication Adjustments/Labs and Tests Ordered: Current medicines are reviewed at length with the patient today.  Concerns regarding medicines are outlined above.  No orders of the defined types were placed in this encounter.  Medication changes: No orders of the defined types were placed in this encounter.   Signed, Park Liter, MD, [facility] 02/15/2019 1:20 PM    [place]

## 2019-02-15 NOTE — Patient Instructions (Signed)
Medication Instructions:  Your physician has recommended you make the following change in your medication:  STOP your Amiodarone INCREASE Metoprolol to 50 mg daily  If you need a refill on your cardiac medications before your next appointment, please call your pharmacy.   Lab work: None ordered If you have labs (blood work) drawn today and your tests are completely normal, you will receive your results only by: Marland Kitchen MyChart Message (if you have MyChart) OR . A paper copy in the mail If you have any lab test that is abnormal or we need to change your treatment, we will call you to review the results.  Testing/Procedures: None ordered  Follow-Up: At [facility], you and your health needs are our priority.  As part of our continuing mission to provide you with exceptional heart care, we have created designated Provider Care Teams.  These Care Teams include your primary Cardiologist (physician) and Advanced Practice Providers (APPs -  Physician Assistants and Nurse Practitioners) who all work together to provide you with the care you need, when you need it. You will need a follow up appointment in 2 weeks. You may see Jenne Campus, MD or another member of our [place] Provider Team in [place]: Shirlee More, MD . Jyl Heinz, MD

## 2019-02-18 ENCOUNTER — Other Ambulatory Visit: Payer: Self-pay | Admitting: Gastroenterology

## 2019-02-18 DIAGNOSIS — K259 Gastric ulcer, unspecified as acute or chronic, without hemorrhage or perforation: Secondary | ICD-10-CM

## 2019-02-24 ENCOUNTER — Other Ambulatory Visit: Payer: Self-pay

## 2019-02-24 DIAGNOSIS — N186 End stage renal disease: Secondary | ICD-10-CM

## 2019-02-24 DIAGNOSIS — Z992 Dependence on renal dialysis: Secondary | ICD-10-CM

## 2019-02-25 ENCOUNTER — Ambulatory Visit (INDEPENDENT_AMBULATORY_CARE_PROVIDER_SITE_OTHER): Payer: Self-pay | Admitting: Vascular Surgery

## 2019-02-25 ENCOUNTER — Other Ambulatory Visit: Payer: Self-pay

## 2019-02-25 ENCOUNTER — Ambulatory Visit (HOSPITAL_COMMUNITY)
Admission: RE | Admit: 2019-02-25 | Discharge: 2019-02-25 | Disposition: A | Discharge status: Home / Self Care | Payer: Medicare Other | Source: Ambulatory Visit | Attending: Vascular Surgery | Admitting: Vascular Surgery

## 2019-02-25 ENCOUNTER — Encounter: Payer: Self-pay | Admitting: Vascular Surgery

## 2019-02-25 VITALS — BP 106/60 | HR 115 | Temp 97.6°F | Resp 20 | Ht 71.0 in | Wt 188.1 lb

## 2019-02-25 DIAGNOSIS — N186 End stage renal disease: Secondary | ICD-10-CM

## 2019-02-25 DIAGNOSIS — Z992 Dependence on renal dialysis: Secondary | ICD-10-CM

## 2019-02-25 NOTE — Progress Notes (Signed)
Patient is a 63 year old male who returns for follow-up today.  He underwent placement of a left brachiocephalic AV fistula January 11, 2019.  He denies any numbness or tingling in his hand.  He has had no incisional breakdown.  Physical exam:  Vitals:   02/25/19 1246  BP: 106/60  Pulse: (!) 115  Resp: 20  Temp: 97.6 F (36.4 C)  SpO2: 96%  Weight: 188 lb 1.6 oz (85.3 kg)  Height: 5\' 11"  (1.803 m)    Left upper extremity well-healed antecubital incision 1+ left radial pulse palpable thrill in fistula fistula is palpable all the way to the upper arm  Data: Patient had a duplex ultrasound today which shows this fistula is 6 mm in diameter throughout its course less than 3 mm from the skin surface.  Assessment: Maturing AV fistula left arm.  Plan: Fistula should be ready for cannulation at the end of July.  He will follow-up with me on as-needed basis if he has difficulty with the fistula in the future.  Ruta Hinds, MD [facility] Office: [phone] Pager: [phone]

## 2019-03-01 ENCOUNTER — Ambulatory Visit (INDEPENDENT_AMBULATORY_CARE_PROVIDER_SITE_OTHER): Payer: Medicare Other | Admitting: Cardiology

## 2019-03-01 ENCOUNTER — Encounter: Payer: Self-pay | Admitting: Cardiology

## 2019-03-01 ENCOUNTER — Other Ambulatory Visit: Payer: Self-pay

## 2019-03-01 VITALS — BP 124/60 | HR 89 | Ht 71.0 in | Wt 188.0 lb

## 2019-03-01 DIAGNOSIS — I739 Peripheral vascular disease, unspecified: Secondary | ICD-10-CM

## 2019-03-01 DIAGNOSIS — I4891 Unspecified atrial fibrillation: Secondary | ICD-10-CM | POA: Diagnosis not present

## 2019-03-01 DIAGNOSIS — I519 Heart disease, unspecified: Secondary | ICD-10-CM | POA: Diagnosis not present

## 2019-03-01 DIAGNOSIS — R0602 Shortness of breath: Secondary | ICD-10-CM

## 2019-03-01 MED ORDER — METOPROLOL SUCCINATE ER 25 MG PO TB24: 75.0000 mg | ORAL_TABLET | Freq: Every day | ORAL | 1 refills | Status: DC

## 2019-03-01 NOTE — Patient Instructions (Signed)
Medication Instructions:  Your physician has recommended you make the following change in your medication:   IncreasE: Metoprolol succinate to 75 mg daily   If you need a refill on your cardiac medications before your next appointment, please call your pharmacy.   Lab work: None.  If you have labs (blood work) drawn today and your tests are completely normal, you will receive your results only by: Marland Kitchen MyChart Message (if you have MyChart) OR . A paper copy in the mail If you have any lab test that is abnormal or we need to change your treatment, we will call you to review the results.  Testing/Procedures: None.   Follow-Up: At [facility], you and your health needs are our priority.  As part of our continuing mission to provide you with exceptional heart care, we have created designated Provider Care Teams.  These Care Teams include your primary Cardiologist (physician) and Advanced Practice Providers (APPs -  Physician Assistants and Nurse Practitioners) who all work together to provide you with the care you need, when you need it. You will need a follow up appointment in 2 weeks.  Please call our office 2 months in advance to schedule this appointment.  You may see Jenne Campus, MD or another member of our [place] Provider Team in [place]: Shirlee More, MD . Jyl Heinz, MD  Any Other Special Instructions Will Be Listed Below (If Applicable).

## 2019-03-01 NOTE — Addendum Note (Signed)
Addended by: Ashok Norris on: 03/01/2019 02:23 PM   Modules accepted: Orders

## 2019-03-01 NOTE — Progress Notes (Signed)
Cardiology Office Note:    Date:  03/01/2019   ID:  Larry Klein, DOB 11-06-1955, MRN [id number]  PCP:  Maryella Shivers, MD  Cardiologist:  Jenne Campus, MD    Referring MD: Maryella Shivers, MD   Chief Complaint  Patient presents with  . 2 Week Follow-up  Not doing well  History of Present Illness:    Larry Klein is a 63 y.o. 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  Recently have seen him after he stopped all his medication I was able to convince him to start some medications back he feels very poorly he is convinced that would make him feel that way and medications today his heart rate is very elevated he did have some surgery on his leg.  Overall he looks exhausted and tired and also very frustrated.  He said I just want to feel good. Denies having any chest pain tightness squeezing pressure been chest does have some exertional shortness of breath.  Past Medical History:  Diagnosis Date  . COPD (chronic obstructive pulmonary disease) ([place])   . Coronary artery disease   . Dyspnea   . High cholesterol   . Hypertension   . Peripheral vascular disease ([place])   . Renal disorder     Past Surgical History:  Procedure Laterality Date  . AV FISTULA PLACEMENT Left 01/11/2019   Procedure: Creation Brachiocephalic Arteriovenous Fistula;  Surgeon: Elam Dutch, MD;  Location: [place];  Service: Vascular;  Laterality: Left;  . BIOPSY  02/04/2019   Procedure: BIOPSY;  Surgeon: Milus Banister, MD;  [facility];  Service: Endoscopy;;  . CARDIAC SURGERY    . CHOLECYSTECTOMY    . CORONARY ARTERY BYPASS GRAFT    . ESOPHAGOGASTRODUODENOSCOPY (EGD) WITH PROPOFOL N/A 01/13/2019   Procedure: ESOPHAGOGASTRODUODENOSCOPY (EGD) WITH PROPOFOL;  Surgeon: Milus Banister, MD;  Location: [facility] ENDOSCOPY;  Service: Endoscopy;  Laterality: N/A;  . ESOPHAGOGASTRODUODENOSCOPY (EGD)  WITH PROPOFOL N/A 02/04/2019   Procedure: ESOPHAGOGASTRODUODENOSCOPY (EGD) WITH PROPOFOL;  Surgeon: Milus Banister, MD;  [facility];  Service: Endoscopy;  Laterality: N/A;  . FOREIGN BODY REMOVAL  01/13/2019   Procedure: FOREIGN BODY REMOVAL;  Surgeon: Milus Banister, MD;  Location: [place];  Service: Endoscopy;;  . LOWER EXTREMITY ANGIOGRAPHY Bilateral 01/08/2019   Procedure: LOWER EXTREMITY ANGIOGRAPHY;  Surgeon: [name], MD;  Location: [place] CV LAB;  Service: Cardiovascular;  Laterality: Bilateral;  . RIGHT/LEFT HEART CATH AND CORONARY/GRAFT ANGIOGRAPHY N/A 01/07/2019   Procedure: RIGHT/LEFT HEART CATH AND CORONARY/GRAFT ANGIOGRAPHY;  Surgeon: Lorretta Harp, MD;  Location: [place] CV LAB;  Service: Cardiovascular;  Laterality: N/A;  . THROMBECTOMY W/ EMBOLECTOMY Left 09/18/2018   Procedure: ARTERIOVENOUS FISTULA LEFT ARM;  Surgeon: Rosetta Posner, MD;  [facility]: [facility];  Service: Vascular;  Laterality: Left;    Current Medications: Current Meds  Medication Sig  . acetaminophen (TYLENOL) 500 MG tablet Take 1,000 mg by mouth every 6 (six) hours as needed for mild pain or headache.   . albuterol (PROVENTIL HFA;VENTOLIN HFA) 108 (90 Base) MCG/ACT inhaler Inhale 2 puffs into the lungs every 6 (six) hours as needed for wheezing.  Marland Kitchen aspirin EC 81 MG EC tablet Take 1 tablet (81 mg total) by mouth daily.  Marland Kitchen atorvastatin (LIPITOR) 40 MG tablet Take 1 tablet (40 mg total) by mouth at bedtime.  . bismuth subsalicylate (PEPTO BISMOL) 262 MG/15ML suspension Take 30 mLs by  mouth every 6 (six) hours as needed for indigestion.  . calcium carbonate (TUMS - DOSED IN MG ELEMENTAL CALCIUM) 500 MG chewable tablet Chew 2 tablets by mouth 3 (three) times daily before meals.  . cefpodoxime (VANTIN) 200 MG tablet Take 1 tab on Tuesday, Thursday, Saturday after HD  . diazepam (VALIUM) 10 MG tablet Take 1 tablet (10 mg total) by mouth at bedtime as needed for muscle spasms (leg  pain).  Marland Kitchen gabapentin (NEURONTIN) 600 MG tablet Take 0.5 tablets (300 mg total) by mouth at bedtime.  Marland Kitchen guaiFENesin (MUCINEX) 600 MG 12 hr tablet Take 1 tablet (600 mg total) by mouth 2 (two) times daily as needed for cough or to loosen phlegm.  Marland Kitchen HYDROcodone-acetaminophen (NORCO/VICODIN) 5-325 MG tablet Take 1 tablet by mouth every 6 (six) hours as needed for moderate pain or severe pain.  Marland Kitchen ipratropium-albuterol (DUONEB) 0.5-2.5 (3) MG/3ML SOLN Take 3 mLs by nebulization every 6 (six) hours as needed (wheezing).  Marland Kitchen levofloxacin (LEVAQUIN) 500 MG tablet Take 1 tablet by mouth 2 (two) times daily.  . metoprolol succinate (TOPROL-XL) 50 MG 24 hr tablet Take 1 tablet (50 mg total) by mouth daily. Take with or immediately following a meal.  . montelukast (SINGULAIR) 10 MG tablet Take 10 mg by mouth daily.   . Multiple Vitamin (MULTIVITAMIN WITH MINERALS) TABS tablet Take 1 tablet by mouth daily.  Marland Kitchen omeprazole (PRILOSEC) 40 MG capsule Take 1 capsule (40 mg total) by mouth 2 (two) times daily before a meal.  . pantoprazole (PROTONIX) 40 MG tablet Take 1 tablet (40 mg total) by mouth daily before breakfast.  . predniSONE (DELTASONE) 20 MG tablet Take 1 tablet (20 mg total) by mouth daily with breakfast. (Patient taking differently: Take 15 mg by mouth daily with breakfast. )  . rOPINIRole (REQUIP) 0.5 MG tablet Take 0.5 mg by mouth at bedtime.  Marland Kitchen umeclidinium-vilanterol (ANORO ELLIPTA) 62.5-25 MCG/INH AEPB Inhale 1 puff into the lungs daily.     Allergies:   Patient has no known allergies.   Social History   Socioeconomic History  . Marital status: Divorced    Spouse name: Not on file  . Number of children: 3  . Years of education: Not on file  . Highest education level: Not on file  Occupational History  . Not on file  Social Needs  . Financial resource strain: Not on file  . Food insecurity    Worry: Not on file    Inability: Not on file  . Transportation needs    Medical: Not on file     Non-medical: Not on file  Tobacco Use  . Smoking status: Former Smoker    Packs/day: 1.00    Years: 50.00    Pack years: 50.00    Quit date: 09/18/2015    Years since quitting: 3.4  . Smokeless tobacco: Never Used  Substance and Sexual Activity  . Alcohol use: Never    Frequency: Never  . Drug use: Never  . Sexual activity: Not Currently  Lifestyle  . Physical activity    Days per week: Not on file    Minutes per session: Not on file  . Stress: Not on file  Relationships  . Social Herbalist on phone: Not on file    Gets together: Not on file    Attends religious service: Not on file    Active member of club or organization: Not on file    Attends meetings of clubs or organizations:  Not on file    Relationship status: Not on file  Other Topics Concern  . Not on file  Social History Narrative  . Not on file     Family History: The patient's family history includes Heart disease in his father and mother. ROS:   Please see the history of present illness.    All 14 point review of systems negative except as described per history of present illness  EKGs/Labs/Other Studies Reviewed:    EKG showed atrial fibrillation fast ventricular rate ventricular rate is 123.  Right bundle branch block nonspecific T wave changes.  Recent Labs: 12/07/2018: B Natriuretic Peptide 1,637.3 12/24/2018: TSH 2.734 12/28/2018: Magnesium 2.1 01/15/2019: ALT 10 01/21/2019: BUN 65; Creatinine, Ser 7.47; Hemoglobin 10.6; Platelets 85; Potassium 4.5; Sodium 137  Recent Lipid Panel    Component Value Date/Time   CHOL 125 12/09/2018 0311   TRIG 90 12/09/2018 0311   HDL 44 12/09/2018 0311   CHOLHDL 2.8 12/09/2018 0311   VLDL 18 12/09/2018 0311   LDLCALC 63 12/09/2018 0311    Physical Exam:    VS:  BP 124/60   Pulse 89   Ht 5\' 11"  (1.803 m)   Wt 188 lb (85.3 kg)   SpO2 93%   BMI 26.22 kg/m     Wt Readings from Last 3 Encounters:  03/01/19 188 lb (85.3 kg)  02/25/19 188 lb 1.6 oz  (85.3 kg)  02/15/19 195 lb 12.8 oz (88.8 kg)     GEN:  Well nourished, well developed in no acute distress HEENT: Normal NECK: No JVD; No carotid bruits LYMPHATICS: No lymphadenopathy CARDIAC: Irregular, no murmurs, no rubs, no gallops RESPIRATORY:  Clear to auscultation without rales, wheezing or rhonchi  ABDOMEN: Soft, non-tender, non-distended MUSCULOSKELETAL:  No edema; No deformity  SKIN: Warm and dry LOWER EXTREMITIES: no swelling NEUROLOGIC:  Alert and oriented x 3 PSYCHIATRIC:  Normal affect   ASSESSMENT:    1. Severe left ventricular systolic dysfunction   2. Atrial fibrillation with RVR ([place])   3. PVD (peripheral vascular disease) ([place])   4. Shortness of breath    PLAN:    In order of problems listed above:  1. Severe left ventricle dysfunction.  Refused to take many medications.  I will do EKG today his heart rate on physical examination is elevated I will try to increase dose of beta-blocker however I am afraid he is going to stop this medication because he overall feels poor.  I wish to be able to add more medications to help with the left ventricle dysfunction however he does not want to take any additional medications. 2. Atrial fibrillation with fast ventricular rate.  Not anticoagulated because of fragile state as well as thrombocytopenia and noncompliance.  Will do EKG and decide about increasing dose of beta-blocker.  EKG was done showed fast ventricular response.  Will increase metoprolol succinate to 75 mg daily see him back in my office in 2 weeks 3. Peripheral vascular disease: Stable. 4. Shortness of breath: Multifactorial but biggest obstacle is the fact that he does not want to take his medications.  Very frustrating and difficult situation gentleman with multiple medical problems refusing therapy.  He is on dialysis he need to be on more medications to improve his left ventricle function however he refused he said that he feels poorly and he wants to the  everything what he can to get better.  I explained to him that he got multiple reasons for feeling bad including his wounds  and the lack of hydrating of dialysis the fiber to get a double-lumen catheter.  He does not fully understand this he said he want to stop all his medications I was very much against it.  Situation is getting quite difficult.   Medication Adjustments/Labs and Tests Ordered: Current medicines are reviewed at length with the patient today.  Concerns regarding medicines are outlined above.  No orders of the defined types were placed in this encounter.  Medication changes: No orders of the defined types were placed in this encounter.   Signed, Larry Liter, MD, [facility] 03/01/2019 1:38 PM    [place]

## 2019-03-01 NOTE — Addendum Note (Signed)
Addended by: Ashok Norris on: 03/01/2019 01:48 PM   Modules accepted: Orders

## 2019-03-15 ENCOUNTER — Ambulatory Visit: Payer: Medicare Other | Admitting: Cardiology

## 2019-03-15 MED ORDER — TIOTROPIUM BROMIDE MONOHYDRATE 18 MCG IN CAPS
1.00 | ORAL_CAPSULE | RESPIRATORY_TRACT | Status: DC
Start: 2019-03-16 — End: 2019-03-15

## 2019-03-15 MED ORDER — POTASSIUM CHLORIDE 20 MEQ PO PACK
20.00 | PACK | ORAL | Status: DC
Start: 2019-03-15 — End: 2019-03-15

## 2019-03-15 MED ORDER — ZINC SULFATE 220 (50 ZN) MG PO CAPS
220.00 | ORAL_CAPSULE | ORAL | Status: DC
Start: 2019-03-16 — End: 2019-03-15

## 2019-03-15 MED ORDER — HYDROCODONE-ACETAMINOPHEN 5-325 MG PO TABS
1.00 | ORAL_TABLET | ORAL | Status: DC
Start: ? — End: 2019-03-15

## 2019-03-15 MED ORDER — BUDESONIDE-FORMOTEROL FUMARATE 80-4.5 MCG/ACT IN AERO
2.00 | INHALATION_SPRAY | RESPIRATORY_TRACT | Status: DC
Start: 2019-03-15 — End: 2019-03-15

## 2019-03-15 MED ORDER — AMIODARONE HCL 200 MG PO TABS
200.00 | ORAL_TABLET | ORAL | Status: DC
Start: 2019-03-16 — End: 2019-03-15

## 2019-03-15 MED ORDER — IPRATROPIUM-ALBUTEROL 0.5-2.5 (3) MG/3ML IN SOLN
3.00 | RESPIRATORY_TRACT | Status: DC
Start: ? — End: 2019-03-15

## 2019-03-15 MED ORDER — GENERIC EXTERNAL MEDICATION
250.00 | Status: DC
Start: 2019-03-15 — End: 2019-03-15

## 2019-03-15 MED ORDER — METOPROLOL SUCCINATE ER 25 MG PO TB24
50.00 | ORAL_TABLET | ORAL | Status: DC
Start: 2019-03-16 — End: 2019-03-15

## 2019-03-15 MED ORDER — PANTOPRAZOLE SODIUM 40 MG PO TBEC
40.00 | DELAYED_RELEASE_TABLET | ORAL | Status: DC
Start: 2019-03-16 — End: 2019-03-15

## 2019-03-15 MED ORDER — ATORVASTATIN CALCIUM 40 MG PO TABS
40.00 | ORAL_TABLET | ORAL | Status: DC
Start: 2019-03-15 — End: 2019-03-15

## 2019-03-24 ENCOUNTER — Other Ambulatory Visit: Payer: Self-pay | Admitting: Cardiology

## 2019-03-25 MED ORDER — METOPROLOL SUCCINATE ER 25 MG PO TB24: 75.0000 mg | ORAL_TABLET | Freq: Every day | ORAL | 1 refills | Status: AC

## 2019-03-26 ENCOUNTER — Telehealth: Payer: Self-pay | Admitting: Gastroenterology

## 2019-03-29 ENCOUNTER — Other Ambulatory Visit (HOSPITAL_COMMUNITY): Payer: Medicare Other

## 2019-03-30 ENCOUNTER — Telehealth: Payer: Self-pay

## 2019-03-30 NOTE — Telephone Encounter (Signed)
I spoke to patient's daughter Elmyra Ricks. EGD at [facility] has been rescheduled for 04/29/19 at 7:30 am. New instructions mailed out. He will have Covid 19 lab test on 04/26/19. Elmyra Ricks has advised patient to continue his twice daily PPI and completely avoid NSAIDS.

## 2019-04-01 ENCOUNTER — Ambulatory Visit (HOSPITAL_COMMUNITY): Admission: RE | Admit: 2019-04-01 | Payer: Medicare Other | Source: Home / Self Care | Admitting: Gastroenterology

## 2019-04-01 ENCOUNTER — Encounter (HOSPITAL_COMMUNITY): Admission: RE | Payer: Self-pay | Source: Home / Self Care

## 2019-04-01 SURGERY — ESOPHAGOGASTRODUODENOSCOPY (EGD) WITH PROPOFOL
Anesthesia: Monitor Anesthesia Care

## 2019-04-06 ENCOUNTER — Encounter (HOSPITAL_COMMUNITY): Payer: Self-pay | Admitting: Gastroenterology

## 2019-04-14 ENCOUNTER — Telehealth: Payer: Self-pay

## 2019-04-14 NOTE — Telephone Encounter (Signed)
appt for 8/13 EGD at [facility] has been rescheduled to 8/6 as well as COVID testing change to 8/3.  Spoke with Loma Sousa at [facility] and faxed new instructions to [phone].  Pt son has also been notified of the change.

## 2019-04-19 ENCOUNTER — Other Ambulatory Visit (HOSPITAL_COMMUNITY): Payer: Medicare Other | Attending: Gastroenterology

## 2019-04-21 ENCOUNTER — Telehealth: Payer: Self-pay

## 2019-04-21 NOTE — Telephone Encounter (Signed)
The pt appt for EGD DIL at [facility] has been rescheduled to 8/27 due to no COVID testing results.  The pt facility [facility] has been advised.  I spoke with Loma Sousa.  She is aware that the pt was moved to 8/27 and new instructions will be faxed to her attention at [phone].  Lattie Haw with [facility] end[facility] is working on getting a protocol in place for patients coming from facility's for Covid testing.  I will fax new instructions as soon as the details have been confirmed.

## 2019-04-22 NOTE — Telephone Encounter (Signed)
Thank you :)

## 2019-04-26 ENCOUNTER — Other Ambulatory Visit (HOSPITAL_COMMUNITY): Payer: Medicare Other

## 2019-04-29 DIAGNOSIS — D693 Immune thrombocytopenic purpura: Secondary | ICD-10-CM

## 2019-04-29 DIAGNOSIS — D696 Thrombocytopenia, unspecified: Secondary | ICD-10-CM | POA: Diagnosis not present

## 2019-05-10 ENCOUNTER — Telehealth: Payer: Self-pay

## 2019-05-10 ENCOUNTER — Other Ambulatory Visit: Payer: Self-pay

## 2019-05-10 NOTE — Telephone Encounter (Signed)
Per Georgiann Mccoy RN the pt's at facilities have 2 options.  Come 3 days prior using the same process as for others or come the day prior and have a more rapid test at the [facility] location.  The pt will need to be masked at all times after testing.    I spoke with the nurse taking care of the pt today at [facility].  She tells me that she is not certain that this can be arranged.  She will speak with transportation and give me a call back.    Dr Jac Canavan

## 2019-05-10 NOTE — Telephone Encounter (Signed)
Ok, please keep me informed.  thanks

## 2019-05-10 NOTE — Telephone Encounter (Signed)
-----   Message from Timothy Lasso, RN sent at 05/06/2019  9:39 AM EDT -----  ----- Message ----- From: Timothy Lasso, RN Sent: 05/06/2019 To: Timothy Lasso, RN   ----- Message ----- From: Timothy Lasso, RN Sent: 04/29/2019 To: Timothy Lasso, RN  Need info for COVID testing for 8/27 egd at [facility] pt at [facility]

## 2019-05-11 NOTE — Telephone Encounter (Signed)
Received a call from [place] at [facility] re[facility] and the pt will have Thay[place] testing on 8/26 and procedure on 8/27.

## 2019-05-11 NOTE — Telephone Encounter (Signed)
Received voice mail from Carter at [facility] to return call at [phone].  Left message with office staff to have her return my call

## 2019-05-12 ENCOUNTER — Other Ambulatory Visit (HOSPITAL_COMMUNITY)
Admission: RE | Admit: 2019-05-12 | Discharge: 2019-05-12 | Disposition: A | Discharge status: Home / Self Care | Payer: Medicare Other | Source: Ambulatory Visit | Attending: Gastroenterology | Admitting: Gastroenterology

## 2019-05-12 ENCOUNTER — Telehealth: Payer: Self-pay | Admitting: Gastroenterology

## 2019-05-12 DIAGNOSIS — Z01812 Encounter for preprocedural laboratory examination: Secondary | ICD-10-CM | POA: Diagnosis not present

## 2019-05-12 DIAGNOSIS — Z20828 Contact with and (suspected) exposure to other viral communicable diseases: Secondary | ICD-10-CM | POA: Insufficient documentation

## 2019-05-12 LAB — SARS CORONAVIRUS 2 (TAT 6-24 HRS): SARS Coronavirus 2: NEGATIVE

## 2019-05-12 NOTE — Progress Notes (Addendum)
Anesthesia Chart Review   Case: [id number] Date/Time: 05/13/19 1230   Procedure: ESOPHAGOGASTRODUODENOSCOPY (EGD) WITH PROPOFOL (N/A )   Anesthesia type: Monitor Anesthesia Care   Pre-op diagnosis: gastric ulcer   [facility]   Surgeon: Milus Banister, MD      DISCUSSION:62 y.o. former smoker (50 pack years, quit 09/18/15) with h/o HTN, COPD, CAD, PVD, ESRF on dialysis, gastric ulcer scheduled for above procedure 05/13/2019 with Dr. Owens Loffler.    Cardiac Cath 01/07/2019 with high-grade lesion.  Suggested volume overload with elevated right atrial pressures.  At this time with thrombocytopenia Dr. Gwenlyn Found did not recommend stenting.  Recommended he would benefit from being more aggressively dialyzed to optimize hemodynamics.   Underwent Esophagogastroduodensocopy w/MAC 02/04/2019 with no anesthesia complications.    Last seen by cardiologist, Dr. Jenne Campus, 03/01/2019.  Per OV note pt had stopped taking all medications.  EKG with a-fib rate of 123 at this visit. Per note Dr. Agustin Cree increased metoprolol to 75 mg and is very frustrated he cannot add more medications at this time due to pt refusal.    PAT nurse has attempted to contact pt today with no contact.  Dr. Arcola Jansky office aware.   Discussed with Dr. Fransisco Beau and Dr. Smith Robert.  Will evaluate DOS.  Nurse will continue to try to reach pt.  I will order EKG DOS.   VS: There were no vitals taken for this visit.  PROVIDERSMaryella Shivers, MD is PCP   Jenne Campus, MD is Cardiologist  LABS: SDW (all labs ordered are listed, but only abnormal results are displayed)  Labs Reviewed - No data to display   IMAGES:   EKG: 03/01/2019 Rate 123 bpm Atrial fibrillation with rapid ventricular response Right bundle branch block  T wave abnormality, consider inferior ischemia  CV: Cardiac Cath 01/07/2019   Prox RCA to Mid RCA lesion is 100% stenosed.  Ost LM to Mid LM lesion is 50%  stenosed.  Ost Cx lesion is 95% stenosed.  Prox LAD lesion is 100% stenosed.  Ost 1st Diag to 1st Diag lesion is 70% stenosed.  Origin to Insertion lesion before 1st Diag is 100% stenosed.  Myocardial Perfusion 12/10/2018 No T wave inversion was noted during stress. Defect 1: There is a medium defect of moderate severity present in the basal inferior and basal inferolateral location. Defect 2: There is a small defect of moderate severity present in the apex location. The left ventricular ejection fraction is severely decreased (<30%). Findings consistent with ischemia and prior myocardial infarction. This is a high risk study.   High risk pharmacological nuclear perfusion study due to severely depressed left ventricular systolic function. Perfusion images suggest a moderate-size inferobasal scar and a small area of apical ischemia, which is largely reversible. The degree of ventricular dysfunction is out of proportion to the area of the perfusion abnormalities, suggesting mixed etiology for the cardiomyopathy.  Echo 12/08/2018 IMPRESSIONS    1. The left ventricle has severely reduced systolic function, with an ejection fraction of 20-25%. The cavity size was moderately dilated. There is mildly increased left ventricular wall thickness. Left ventricular diastolic function could not be  evaluated secondary to atrial fibrillation. Left ventricular diffuse hypokinesis.  2. The right ventricle has mildly reduced systolic function. The cavity was mildly enlarged. There is no increase in right ventricular wall thickness.  3. Left atrial size was moderately dilated.  4. Right atrial size was moderately dilated.  5. The mitral valve is  degenerative. Moderate thickening of the mitral valve leaflet. Mitral valve regurgitation is moderate to severe by color flow Doppler. The MR jet is anteriorly-directed.  6. The tricuspid valve is grossly normal.  7. The aortic valve was not well visualized  Moderate sclerosis of the aortic valve.  8. The inferior vena cava was dilated in size with <50% respiratory variability. Past Medical History:  Diagnosis Date  . COPD (chronic obstructive pulmonary disease) ([place])   . Coronary artery disease   . Dyspnea   . High cholesterol   . Hypertension   . Peripheral vascular disease ([place])   . Renal disorder     Past Surgical History:  Procedure Laterality Date  . AV FISTULA PLACEMENT Left 01/11/2019   Procedure: Creation Brachiocephalic Arteriovenous Fistula;  Surgeon: Elam Dutch, MD;  Location: [place];  Service: Vascular;  Laterality: Left;  . BIOPSY  02/04/2019   Procedure: BIOPSY;  Surgeon: Milus Banister, MD;  [facility];  Service: Endoscopy;;  . CARDIAC SURGERY    . CHOLECYSTECTOMY    . CORONARY ARTERY BYPASS GRAFT    . ESOPHAGOGASTRODUODENOSCOPY (EGD) WITH PROPOFOL N/A 02/04/2019   Procedure: ESOPHAGOGASTRODUODENOSCOPY (EGD) WITH PROPOFOL;  Surgeon: Milus Banister, MD;  [facility];  Service: Endoscopy;  Laterality: N/A;  . ESOPHAGOGASTRODUODENOSCOPY (EGD) WITH PROPOFOL N/A 01/13/2019   Procedure: ESOPHAGOGASTRODUODENOSCOPY (EGD) WITH PROPOFOL;  Surgeon: Milus Banister, MD;  Location: [facility] ENDOSCOPY;  Service: Endoscopy;  Laterality: N/A;  . FOREIGN BODY REMOVAL  01/13/2019   Procedure: FOREIGN BODY REMOVAL;  Surgeon: Milus Banister, MD;  Location: [place];  Service: Endoscopy;;  . LOWER EXTREMITY ANGIOGRAPHY Bilateral 01/08/2019   Procedure: LOWER EXTREMITY ANGIOGRAPHY;  Surgeon: [name], MD;  Location: [place] CV LAB;  Service: Cardiovascular;  Laterality: Bilateral;  . RIGHT/LEFT HEART CATH AND CORONARY/GRAFT ANGIOGRAPHY N/A 01/07/2019   Procedure: RIGHT/LEFT HEART CATH AND CORONARY/GRAFT ANGIOGRAPHY;  Surgeon: Lorretta Harp, MD;  Location: [place] CV LAB;  Service: Cardiovascular;  Laterality: N/A;  . THROMBECTOMY W/ EMBOLECTOMY Left 09/18/2018   Procedure: ARTERIOVENOUS FISTULA  LEFT ARM;  Surgeon: Rosetta Posner, MD;  [facility];  Service: Vascular;  Laterality: Left;    MEDICATIONS: No current facility-administered medications for this encounter.    Marland Kitchen acetaminophen (TYLENOL) 500 MG tablet  . albuterol (PROVENTIL HFA;VENTOLIN HFA) 108 (90 Base) MCG/ACT inhaler  . aspirin EC 81 MG EC tablet  . atorvastatin (LIPITOR) 40 MG tablet  . bismuth subsalicylate (PEPTO BISMOL) 262 MG/15ML suspension  . calcium carbonate (TUMS - DOSED IN MG ELEMENTAL CALCIUM) 500 MG chewable tablet  . cefpodoxime (VANTIN) 200 MG tablet  . diazepam (VALIUM) 10 MG tablet  . gabapentin (NEURONTIN) 600 MG tablet  . guaiFENesin (MUCINEX) 600 MG 12 hr tablet  . HYDROcodone-acetaminophen (NORCO/VICODIN) 5-325 MG tablet  . ipratropium-albuterol (DUONEB) 0.5-2.5 (3) MG/3ML SOLN  . levofloxacin (LEVAQUIN) 500 MG tablet  . metoprolol succinate (TOPROL-XL) 25 MG 24 hr tablet  . montelukast (SINGULAIR) 10 MG tablet  . Multiple Vitamin (MULTIVITAMIN WITH MINERALS) TABS tablet  . omeprazole (PRILOSEC) 40 MG capsule  . pantoprazole (PROTONIX) 40 MG tablet  . predniSONE (DELTASONE) 20 MG tablet  . rOPINIRole (REQUIP) 0.5 MG tablet    [facility] Pre-Surgical Testing [phone] 05/12/19 2:56 PM

## 2019-05-12 NOTE — H&P (View-Only) (Signed)
Anesthesia Chart Review   Case: [id number] Date/Time: 05/13/19 1230   Procedure: ESOPHAGOGASTRODUODENOSCOPY (EGD) WITH PROPOFOL (N/A )   Anesthesia type: Monitor Anesthesia Care   Pre-op diagnosis: gastric ulcer   [facility] / [facility]   Surgeon: Milus Banister, MD      DISCUSSION:63 y.o. former smoker (50 pack years, quit 09/18/15) with h/o HTN, COPD, CAD, PVD, ESRF on dialysis, gastric ulcer scheduled for above procedure 05/13/2019 with Dr. Owens Loffler.    Cardiac Cath 01/07/2019 with high-grade lesion.  Suggested volume overload with elevated right atrial pressures.  At this time with thrombocytopenia Dr. Gwenlyn Found did not recommend stenting.  Recommended he would benefit from being more aggressively dialyzed to optimize hemodynamics.   Underwent Esophagogastroduodensocopy w/MAC 02/04/2019 with no anesthesia complications.    Last seen by cardiologist, Dr. Jenne Campus, 03/01/2019.  Per OV note pt had stopped taking all medications.  EKG with a-fib rate of 123 at this visit. Per note Dr. Agustin Cree increased metoprolol to 75 mg and is very frustrated he cannot add more medications at this time due to pt refusal.    PAT nurse has attempted to contact pt today with no contact.  Dr. Arcola Jansky office aware.   Discussed with Dr. Fransisco Beau and Dr. Smith Robert.  Will evaluate DOS.  Nurse will continue to try to reach pt.  I will order EKG DOS.   VS: There were no vitals taken for this visit.  PROVIDERSMaryella Shivers, MD is PCP   Jenne Campus, MD is Cardiologist  LABS: SDW (all labs ordered are listed, but only abnormal results are displayed)  Labs Reviewed - No data to display   IMAGES:   EKG: 03/01/2019 Rate 123 bpm Atrial fibrillation with rapid ventricular response Right bundle branch block  T wave abnormality, consider inferior ischemia  CV: Cardiac Cath 01/07/2019   Prox RCA to Mid RCA lesion is 100% stenosed.  Ost LM to Mid LM lesion is 50%  stenosed.  Ost Cx lesion is 95% stenosed.  Prox LAD lesion is 100% stenosed.  Ost 1st Diag to 1st Diag lesion is 70% stenosed.  Origin to Insertion lesion before 1st Diag is 100% stenosed.  Myocardial Perfusion 12/10/2018 No T wave inversion was noted during stress. Defect 1: There is a medium defect of moderate severity present in the basal inferior and basal inferolateral location. Defect 2: There is a small defect of moderate severity present in the apex location. The left ventricular ejection fraction is severely decreased (<30%). Findings consistent with ischemia and prior myocardial infarction. This is a high risk study.   High risk pharmacological nuclear perfusion study due to severely depressed left ventricular systolic function. Perfusion images suggest a moderate-size inferobasal scar and a small area of apical ischemia, which is largely reversible. The degree of ventricular dysfunction is out of proportion to the area of the perfusion abnormalities, suggesting mixed etiology for the cardiomyopathy.  Echo 12/08/2018 IMPRESSIONS    1. The left ventricle has severely reduced systolic function, with an ejection fraction of 20-25%. The cavity size was moderately dilated. There is mildly increased left ventricular wall thickness. Left ventricular diastolic function could not be  evaluated secondary to atrial fibrillation. Left ventricular diffuse hypokinesis.  2. The right ventricle has mildly reduced systolic function. The cavity was mildly enlarged. There is no increase in right ventricular wall thickness.  3. Left atrial size was moderately dilated.  4. Right atrial size was moderately dilated.  5. The mitral valve is  degenerative. Moderate thickening of the mitral valve leaflet. Mitral valve regurgitation is moderate to severe by color flow Doppler. The MR jet is anteriorly-directed.  6. The tricuspid valve is grossly normal.  7. The aortic valve was not well visualized  Moderate sclerosis of the aortic valve.  8. The inferior vena cava was dilated in size with <50% respiratory variability. Past Medical History:  Diagnosis Date  . COPD (chronic obstructive pulmonary disease) ([place])   . Coronary artery disease   . Dyspnea   . High cholesterol   . Hypertension   . Peripheral vascular disease ([place])   . Renal disorder     Past Surgical History:  Procedure Laterality Date  . AV FISTULA PLACEMENT Left 01/11/2019   Procedure: Creation Brachiocephalic Arteriovenous Fistula;  Surgeon: Elam Dutch, MD;  Location: [place];  Service: Vascular;  Laterality: Left;  . BIOPSY  02/04/2019   Procedure: BIOPSY;  Surgeon: Milus Banister, MD;  [facility];  Service: Endoscopy;;  . CARDIAC SURGERY    . CHOLECYSTECTOMY    . CORONARY ARTERY BYPASS GRAFT    . ESOPHAGOGASTRODUODENOSCOPY (EGD) WITH PROPOFOL N/A 02/04/2019   Procedure: ESOPHAGOGASTRODUODENOSCOPY (EGD) WITH PROPOFOL;  Surgeon: Milus Banister, MD;  [facility];  Service: Endoscopy;  Laterality: N/A;  . ESOPHAGOGASTRODUODENOSCOPY (EGD) WITH PROPOFOL N/A 01/13/2019   Procedure: ESOPHAGOGASTRODUODENOSCOPY (EGD) WITH PROPOFOL;  Surgeon: Milus Banister, MD;  Location: [facility] ENDOSCOPY;  Service: Endoscopy;  Laterality: N/A;  . FOREIGN BODY REMOVAL  01/13/2019   Procedure: FOREIGN BODY REMOVAL;  Surgeon: Milus Banister, MD;  Location: [place];  Service: Endoscopy;;  . LOWER EXTREMITY ANGIOGRAPHY Bilateral 01/08/2019   Procedure: LOWER EXTREMITY ANGIOGRAPHY;  Surgeon: [name], MD;  Location: [place] CV [facility];  Service: Cardiovascular;  Laterality: Bilateral;  . RIGHT/LEFT HEART CATH AND CORONARY/GRAFT ANGIOGRAPHY N/A 01/07/2019   Procedure: RIGHT/LEFT HEART CATH AND CORONARY/GRAFT ANGIOGRAPHY;  Surgeon: Lorretta Harp, MD;  Location: [place] [facility];  Service: Cardiovascular;  Laterality: N/A;  . THROMBECTOMY W/ EMBOLECTOMY Left 09/18/2018   Procedure: ARTERIOVENOUS FISTULA  LEFT ARM;  Surgeon: Rosetta Posner, MD;  Location: [facility];  Service: Vascular;  Laterality: Left;    MEDICATIONS: No current facility-administered medications for this encounter.    Marland Kitchen acetaminophen (TYLENOL) 500 MG tablet  . albuterol (PROVENTIL HFA;VENTOLIN HFA) 108 (90 Base) MCG/ACT inhaler  . aspirin EC 81 MG EC tablet  . atorvastatin (LIPITOR) 40 MG tablet  . bismuth subsalicylate (PEPTO BISMOL) 262 MG/15ML suspension  . calcium carbonate (TUMS - DOSED IN MG ELEMENTAL CALCIUM) 500 MG chewable tablet  . cefpodoxime (VANTIN) 200 MG tablet  . diazepam (VALIUM) 10 MG tablet  . gabapentin (NEURONTIN) 600 MG tablet  . guaiFENesin (MUCINEX) 600 MG 12 hr tablet  . HYDROcodone-acetaminophen (NORCO/VICODIN) 5-325 MG tablet  . ipratropium-albuterol (DUONEB) 0.5-2.5 (3) MG/3ML SOLN  . levofloxacin (LEVAQUIN) 500 MG tablet  . metoprolol succinate (TOPROL-XL) 25 MG 24 hr tablet  . montelukast (SINGULAIR) 10 MG tablet  . Multiple Vitamin (MULTIVITAMIN WITH MINERALS) TABS tablet  . omeprazole (PRILOSEC) 40 MG capsule  . pantoprazole (PROTONIX) 40 MG tablet  . predniSONE (DELTASONE) 20 MG tablet  . rOPINIRole (REQUIP) 0.5 MG tablet    Maia Plan [facility] Pre-Surgical Testing [phone] 05/12/19 2:56 PM

## 2019-05-12 NOTE — Telephone Encounter (Signed)
Nurse at [facility] informed that she is unable to contact pt.  Pt is scheduled for procedure 8/27 at [facility]. Please call her back [phone]

## 2019-05-12 NOTE — Progress Notes (Signed)
Left message home number for patient to return  Call, no vm cell number, called emergency contact Larry Klein vm home or cell number, called dr Jefm Bryant office and spoke with Minna Merritts to notify Larry Klein and dr Ardis Hughs unable to make contact with patient for 05-13-19 procedure.

## 2019-05-12 NOTE — Progress Notes (Signed)
LEFT VOICE MAIL MESSAGE HOME NUMBER FOR PATIENT TO RETURN CALL FOR INSTRUCTIONS FOR 05-13-19 PROCEDURE. CELL NUMBER VM FULL

## 2019-05-12 NOTE — Telephone Encounter (Signed)
I tried the number listed to call back and it sounds like a real estate number and the other number listed goes to a random voice mail.  [facility] nurse said no one called from that office.  Not sure who called or what they need.

## 2019-05-13 ENCOUNTER — Ambulatory Visit (HOSPITAL_COMMUNITY)
Admission: RE | Admit: 2019-05-13 | Discharge: 2019-05-13 | Disposition: A | Discharge status: Skilled Nursing Facility | Payer: Medicare Other | Attending: Gastroenterology | Admitting: Gastroenterology

## 2019-05-13 ENCOUNTER — Ambulatory Visit (HOSPITAL_COMMUNITY): Payer: Medicare Other | Admitting: Physician Assistant

## 2019-05-13 ENCOUNTER — Other Ambulatory Visit (HOSPITAL_COMMUNITY): Payer: Medicare Other

## 2019-05-13 ENCOUNTER — Encounter (HOSPITAL_COMMUNITY)
Admission: RE | Disposition: A | Discharge status: Skilled Nursing Facility | Payer: Self-pay | Source: Home / Self Care | Attending: Gastroenterology

## 2019-05-13 ENCOUNTER — Encounter (HOSPITAL_COMMUNITY): Payer: Self-pay | Admitting: *Deleted

## 2019-05-13 ENCOUNTER — Other Ambulatory Visit: Payer: Self-pay

## 2019-05-13 DIAGNOSIS — Z09 Encounter for follow-up examination after completed treatment for conditions other than malignant neoplasm: Secondary | ICD-10-CM | POA: Insufficient documentation

## 2019-05-13 DIAGNOSIS — N186 End stage renal disease: Secondary | ICD-10-CM | POA: Insufficient documentation

## 2019-05-13 DIAGNOSIS — K219 Gastro-esophageal reflux disease without esophagitis: Secondary | ICD-10-CM | POA: Diagnosis not present

## 2019-05-13 DIAGNOSIS — K2970 Gastritis, unspecified, without bleeding: Secondary | ICD-10-CM | POA: Diagnosis not present

## 2019-05-13 DIAGNOSIS — Z7952 Long term (current) use of systemic steroids: Secondary | ICD-10-CM | POA: Diagnosis not present

## 2019-05-13 DIAGNOSIS — Z951 Presence of aortocoronary bypass graft: Secondary | ICD-10-CM | POA: Insufficient documentation

## 2019-05-13 DIAGNOSIS — Z87891 Personal history of nicotine dependence: Secondary | ICD-10-CM | POA: Insufficient documentation

## 2019-05-13 DIAGNOSIS — Z79899 Other long term (current) drug therapy: Secondary | ICD-10-CM | POA: Diagnosis not present

## 2019-05-13 DIAGNOSIS — R1013 Epigastric pain: Secondary | ICD-10-CM | POA: Diagnosis not present

## 2019-05-13 DIAGNOSIS — Z7982 Long term (current) use of aspirin: Secondary | ICD-10-CM | POA: Insufficient documentation

## 2019-05-13 DIAGNOSIS — Z992 Dependence on renal dialysis: Secondary | ICD-10-CM | POA: Insufficient documentation

## 2019-05-13 DIAGNOSIS — I120 Hypertensive chronic kidney disease with stage 5 chronic kidney disease or end stage renal disease: Secondary | ICD-10-CM | POA: Diagnosis not present

## 2019-05-13 DIAGNOSIS — J449 Chronic obstructive pulmonary disease, unspecified: Secondary | ICD-10-CM | POA: Insufficient documentation

## 2019-05-13 DIAGNOSIS — I2510 Atherosclerotic heart disease of native coronary artery without angina pectoris: Secondary | ICD-10-CM | POA: Insufficient documentation

## 2019-05-13 DIAGNOSIS — E1122 Type 2 diabetes mellitus with diabetic chronic kidney disease: Secondary | ICD-10-CM | POA: Diagnosis not present

## 2019-05-13 DIAGNOSIS — Z8711 Personal history of peptic ulcer disease: Secondary | ICD-10-CM | POA: Insufficient documentation

## 2019-05-13 DIAGNOSIS — E7800 Pure hypercholesterolemia, unspecified: Secondary | ICD-10-CM | POA: Insufficient documentation

## 2019-05-13 DIAGNOSIS — E1151 Type 2 diabetes mellitus with diabetic peripheral angiopathy without gangrene: Secondary | ICD-10-CM | POA: Insufficient documentation

## 2019-05-13 DIAGNOSIS — Z79891 Long term (current) use of opiate analgesic: Secondary | ICD-10-CM | POA: Diagnosis not present

## 2019-05-13 DIAGNOSIS — K259 Gastric ulcer, unspecified as acute or chronic, without hemorrhage or perforation: Secondary | ICD-10-CM

## 2019-05-13 HISTORY — PX: ESOPHAGOGASTRODUODENOSCOPY (EGD) WITH PROPOFOL: SHX5813

## 2019-05-13 LAB — POCT I-STAT 4, (NA,K, GLUC, HGB,HCT)
Glucose, Bld: 86 mg/dL (ref 70–99)
HCT: 30 % — ABNORMAL LOW (ref 39.0–52.0)
Hemoglobin: 10.2 g/dL — ABNORMAL LOW (ref 13.0–17.0)
Potassium: 3.8 mmol/L (ref 3.5–5.1)
Sodium: 142 mmol/L (ref 135–145)

## 2019-05-13 SURGERY — ESOPHAGOGASTRODUODENOSCOPY (EGD) WITH PROPOFOL
Anesthesia: Monitor Anesthesia Care

## 2019-05-13 MED ORDER — SODIUM CHLORIDE 0.9 % IV SOLN
INTRAVENOUS | Status: DC
  Administered 2019-05-13: 11:00:00 via INTRAVENOUS

## 2019-05-13 MED ORDER — PROPOFOL 10 MG/ML IV BOLUS
INTRAVENOUS | Status: DC | PRN
  Administered 2019-05-13 (×2): 20 mg via INTRAVENOUS

## 2019-05-13 MED ORDER — PROPOFOL 500 MG/50ML IV EMUL
INTRAVENOUS | Status: DC | PRN
  Administered 2019-05-13: 90 ug/kg/min via INTRAVENOUS

## 2019-05-13 MED ORDER — PROPOFOL 10 MG/ML IV BOLUS
INTRAVENOUS | Status: AC
  Filled 2019-05-13: qty 40

## 2019-05-13 SURGICAL SUPPLY — 15 items

## 2019-05-13 NOTE — Progress Notes (Signed)
Spoke with patients nurse at [facility] about patient having some blood around dialysis access and gave update about procedure. Pt and LPN report that this is normal for him. LPN states she will recheck once he arrives back at facility. Ronalee Belts with transport will be driving patient back.

## 2019-05-13 NOTE — Op Note (Signed)
[facility] Patient Name: Larry Klein Procedure Date: 05/13/2019 MRN: [id number] Attending MD: Milus Banister , MD Date of Birth: September 26, 1955 CSN: [id number] Age: 63 Admit Type: Outpatient Procedure:                Upper GI endoscopy Indications:              Pyloric channel ulcer noted during EGD 01/2019;                            biopsies negative for neoplasia or H. pylori, was                            causing partial GOO; since then PPI BID, NSAID                            avoidance Providers:                Milus Banister, MD, Cleda Daub, RN, Ashley Akeyla Molden, RN, William Dalton, Technician Referring MD:              Medicines:                Monitored Anesthesia Care Complications:            No immediate complications. Estimated blood loss:                            None. Estimated Blood Loss:     Estimated blood loss: none. Procedure:                Pre-Anesthesia Assessment:                           - Prior to the procedure, a History and Physical                            was performed, and patient medications and                            allergies were reviewed. The patient's tolerance of                            previous anesthesia was also reviewed. The risks                            and benefits of the procedure and the sedation                            options and risks were discussed with the patient.                            All questions were answered, and informed consent  was obtained. Prior Anticoagulants: The patient has                            taken no previous anticoagulant or antiplatelet                            agents. ASA Grade Assessment: IV - A patient with                            severe systemic disease that is a constant threat                            to life. After reviewing the risks and benefits,                            the patient was deemed in  satisfactory condition to                            undergo the procedure.                           After obtaining informed consent, the endoscope was                            passed under direct vision. Throughout the                            procedure, the patient's blood pressure, pulse, and                            oxygen saturations were monitored continuously. The                            [id number]) Olympus gastroscope was                            introduced through the mouth, and advanced to the                            second part of duodenum. The upper GI endoscopy was                            accomplished without difficulty. The patient                            tolerated the procedure well. Scope In: Scope Out: Findings:      The esophagus was normal.      There was mild non-specific gastritis. The previously noted pyloric       channel ulcer has healed. There is some slightly friable mucosa at the       site, nothing neoplastic appearing.      The examined duodenum was normal. Impression:               - There was mild non-specific gastritis. The  previously noted pyloric channel ulcer has healed.                            There is some slightly friable mucosa at the site,                            nothing neoplastic appearing. Moderate Sedation:      Not Applicable - Patient had care per Anesthesia. Recommendation:           - Patient has a contact number available for                            emergencies. The signs and symptoms of potential                            delayed complications were discussed with the                            patient. Return to normal activities tomorrow.                            Written discharge instructions were provided to the                            patient.                           - Resume previous diet.                           - Continue present medications, especially  twice                            daily prilosec (omeprazole). Continue to avoid                            NSAIDs. OK to take aspirin if needed for heart                            disease. Procedure Code(s):        --- Professional ---                           [phone], Esophagogastroduodenoscopy, flexible,                            transoral; diagnostic, including collection of                            specimen(s) by brushing or washing, when performed                            (separate procedure) Diagnosis Code(s):        --- Professional ---  R10.13, Epigastric pain CPT copyright 2019 American Medical Association. All rights reserved. The codes documented in this report are preliminary and upon coder review may  be revised to meet current compliance requirements. Milus Banister, MD 05/13/2019 12:00:38 PM This report has been signed electronically. Number of Addenda: 0

## 2019-05-13 NOTE — Transfer of Care (Signed)
Immediate Anesthesia Transfer of Care Note  Patient: Larry Klein  Procedure(s) Performed: Procedure(s): ESOPHAGOGASTRODUODENOSCOPY (EGD) WITH PROPOFOL (N/A)  Patient Location: PACU  Anesthesia Type:MAC  Level of Consciousness:  sedated, patient cooperative and responds to stimulation  Airway & Oxygen Therapy:Patient Spontanous Breathing and Patient connected to face mask oxgen  Post-op Assessment:  Report given to PACU RN and Post -op Vital signs reviewed and stable  Post vital signs:  Reviewed and stable  Last Vitals:  Vitals:   05/13/19 1018 05/13/19 1156  BP: 122/66 (!) 116/50  Pulse:  82  Resp: 16 12  Temp: 36.6 C 36.5 C  SpO2:  34%    Complications: No apparent anesthesia complications

## 2019-05-13 NOTE — Interval H&P Note (Signed)
History and Physical Interval Note:  05/13/2019 10:39 AM  Larry Klein  has presented today for surgery, with the diagnosis of gastric ulcer.  The various methods of treatment have been discussed with the patient and family. After consideration of risks, benefits and other options for treatment, the patient has consented to  Procedure(s): ESOPHAGOGASTRODUODENOSCOPY (EGD) WITH PROPOFOL (N/A) as a surgical intervention.  The patient's history has been reviewed, patient examined, no change in status, stable for surgery.  I have reviewed the patient's chart and labs.  Questions were answered to the patient's satisfaction.     Milus Banister

## 2019-05-13 NOTE — Discharge Instructions (Signed)
YOU HAD AN ENDOSCOPIC PROCEDURE TODAY: Refer to the procedure report and other information in the discharge instructions given to you for any specific questions about what was found during the examination. If this information does not answer your questions, please call [place] office at [phone] to clarify.  ° °YOU SHOULD EXPECT: Some feelings of bloating in the abdomen. Passage of more gas than usual. Walking can help get rid of the air that was put into your GI tract during the procedure and reduce the bloating. If you had a lower endoscopy (such as a colonoscopy or flexible sigmoidoscopy) you may notice spotting of blood in your stool or on the toilet paper. Some abdominal soreness may be present for a day or two, also. ° °DIET: Your first meal following the procedure should be a light meal and then it is ok to progress to your normal diet. A half-sandwich or bowl of soup is an example of a good first meal. Heavy or fried foods are harder to digest and may make you feel nauseous or bloated. Drink plenty of fluids but you should avoid alcoholic beverages for 24 hours. If you had a esophageal dilation, please see attached instructions for diet.   ° °ACTIVITY: Your care partner should take you home directly after the procedure. You should plan to take it easy, moving slowly for the rest of the day. You can resume normal activity the day after the procedure however YOU SHOULD NOT DRIVE, use power tools, machinery or perform tasks that involve climbing or major physical exertion for 24 hours (because of the sedation medicines used during the test).  ° °SYMPTOMS TO REPORT IMMEDIATELY: °A gastroenterologist can be reached at any hour. Please call [phone]  for any of the following symptoms:  °Following lower endoscopy (colonoscopy, flexible sigmoidoscopy) °Excessive amounts of blood in the stool  °Significant tenderness, worsening of abdominal pains  °Swelling of the abdomen that is new, acute  °Fever of 100° or  higher  °Following upper endoscopy (EGD, EUS, ERCP, esophageal dilation) °Vomiting of blood or coffee ground material  °New, significant abdominal pain  °New, significant chest pain or pain under the shoulder blades  °Painful or persistently difficult swallowing  °New shortness of breath  °Black, tarry-looking or red, bloody stools ° °FOLLOW UP:  °If any biopsies were taken you will be contacted by phone or by letter within the next 1-3 weeks. Call [phone]  if you have not heard about the biopsies in 3 weeks.  °Please also call with any specific questions about appointments or follow up tests. ° °

## 2019-05-13 NOTE — Anesthesia Preprocedure Evaluation (Addendum)
Anesthesia Evaluation  Patient identified by MRN, date of birth, ID band Patient awake    Reviewed: Allergy & Precautions, NPO status , Patient's Chart, lab work & pertinent test results  Airway Mallampati: IV  TM Distance: >3 FB Neck ROM: Full    Dental  (+) Edentulous Upper, Edentulous Lower   Pulmonary COPD,  COPD inhaler, former smoker,    Pulmonary exam normal breath sounds clear to auscultation       Cardiovascular hypertension, Pt. on medications and Pt. on home beta blockers + CAD, + CABG and + Peripheral Vascular Disease  Normal cardiovascular exam+ dysrhythmias Atrial Fibrillation  Rhythm:Regular Rate:Normal     Neuro/Psych negative neurological ROS     GI/Hepatic Neg liver ROS, GERD  Medicated and Controlled,  Endo/Other  negative endocrine ROS  Renal/GU ESRF and DialysisRenal diseaseOh HD M, W, F     Musculoskeletal negative musculoskeletal ROS (+)   Abdominal   Peds  Hematology HLD   Anesthesia Other Findings gastric ulcer  Reproductive/Obstetrics                            Anesthesia Physical Anesthesia Plan  ASA: IV  Anesthesia Plan: MAC   Post-op Pain Management:    Induction: Intravenous  PONV Risk Score and Plan: 1 and Propofol infusion and Treatment may vary due to age or medical condition  Airway Management Planned: Nasal Cannula  Additional Equipment:   Intra-op Plan:   Post-operative Plan:   Informed Consent: I have reviewed the patients History and Physical, chart, labs and discussed the procedure including the risks, benefits and alternatives for the proposed anesthesia with the patient or authorized representative who has indicated his/her understanding and acceptance.     Dental advisory given  Plan Discussed with: CRNA  Anesthesia Plan Comments:         Anesthesia Quick Evaluation

## 2019-05-13 NOTE — Anesthesia Postprocedure Evaluation (Signed)
Anesthesia Post Note  Patient: Larry Klein  Procedure(s) Performed: ESOPHAGOGASTRODUODENOSCOPY (EGD) WITH PROPOFOL (N/A )     Patient location during evaluation: Endoscopy Anesthesia Type: MAC Level of consciousness: awake and alert Pain management: pain level controlled Vital Signs Assessment: post-procedure vital signs reviewed and stable Respiratory status: spontaneous breathing, nonlabored ventilation, respiratory function stable and patient connected to nasal cannula oxygen Cardiovascular status: stable and blood pressure returned to baseline Postop Assessment: no apparent nausea or vomiting Anesthetic complications: no    Last Vitals:  Vitals:   05/13/19 1220 05/13/19 1230  BP: (!) 152/71 (!) 142/74  Pulse: 81 (!) 51  Resp: 17 13  Temp:    SpO2: 99% 97%    Last Pain:  Vitals:   05/13/19 1230  TempSrc:   PainSc: 0-No pain                 Karron Alvizo P Ameer Sanden

## 2019-06-17 DIAGNOSIS — D693 Immune thrombocytopenic purpura: Secondary | ICD-10-CM | POA: Diagnosis not present

## 2019-07-23 ENCOUNTER — Inpatient Hospital Stay (HOSPITAL_COMMUNITY): Admission: AD | Admit: 2019-07-23 | Payer: Medicare Other | Admitting: Family Medicine

## 2019-07-23 DIAGNOSIS — R778 Other specified abnormalities of plasma proteins: Secondary | ICD-10-CM

## 2019-07-23 DIAGNOSIS — I2510 Atherosclerotic heart disease of native coronary artery without angina pectoris: Secondary | ICD-10-CM | POA: Diagnosis not present

## 2019-07-23 DIAGNOSIS — I4891 Unspecified atrial fibrillation: Secondary | ICD-10-CM | POA: Diagnosis not present

## 2019-07-23 DIAGNOSIS — I509 Heart failure, unspecified: Secondary | ICD-10-CM | POA: Diagnosis not present

## 2019-08-17 DEATH — deceased

## 2019-08-19 ENCOUNTER — Ambulatory Visit: Payer: Medicare Other | Admitting: Neurology

## 2019-08-24 ENCOUNTER — Encounter: Payer: Self-pay | Admitting: Neurology

## 2019-08-30 ENCOUNTER — Telehealth: Payer: Self-pay | Admitting: Neurology

## 2019-08-30 NOTE — Telephone Encounter (Signed)
Noted. Chart has been changed

## 2019-08-30 NOTE — Telephone Encounter (Signed)
Megan, do we need to route this to someone in billing?

## 2019-08-30 NOTE — Telephone Encounter (Signed)
Pt's daughter in law called wanting to inform the office that the reason the pt missed his appt on September 03, 2019 was because he had passed away. She wanted this to be documented so no more letters are sent.

## 2020-07-09 IMAGING — MR MRI OF LOWER LEFT EXTREMITY WITHOUT CONTRAST
7 series · 40 of 40 positions shown · non-contrast
Comparison: Lower extremity ultrasound 12/29/2018. CT runoff
12/27/2018.

CLINICAL DATA: Mass-like fullness in the left lower leg. Bilateral
foot erythema with pitting edema. Heterogeneous soft tissue mass on
ultrasound.

EXAM:
MRI OF LOWER LEFT EXTREMITY WITHOUT CONTRAST
TECHNIQUE: Multiplanar, multisequence MR imaging of the left lower leg was
performed. No intravenous contrast was administered due to renal
failure.

[Series 4: composed cor t1_comp · coronal · left · 6.0mm · 1.04mm/px · 3 of 25 slices shown]
[im 1/25]
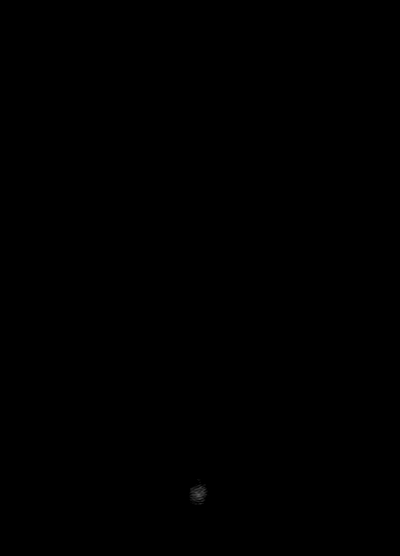
[im 13/25]
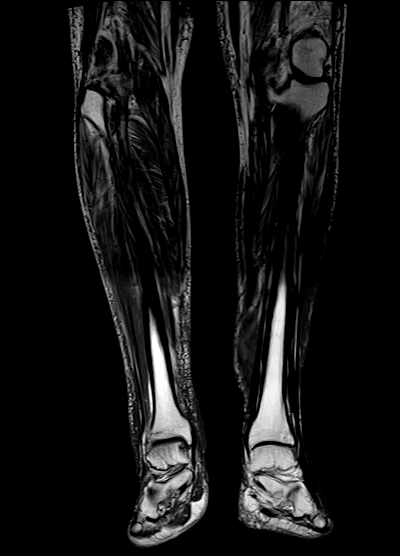
[im 25/25]
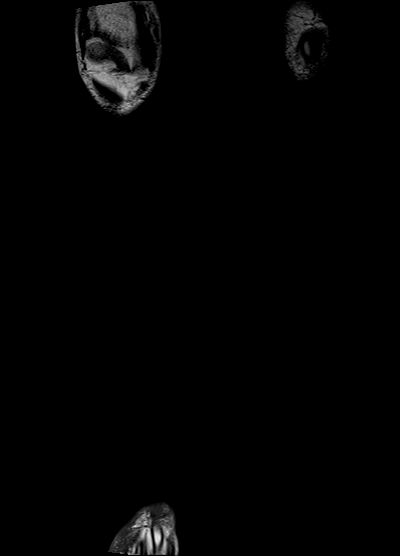

[Series 5: composed cor t1_comp_filt · coronal · left · 6.0mm · 1.04mm/px · 3 of 25 slices shown]
[im 1/25]
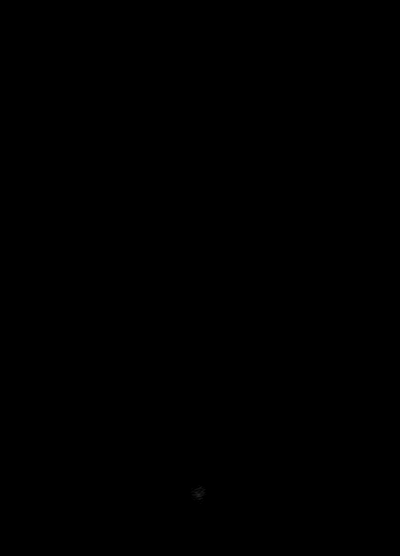
[im 13/25]
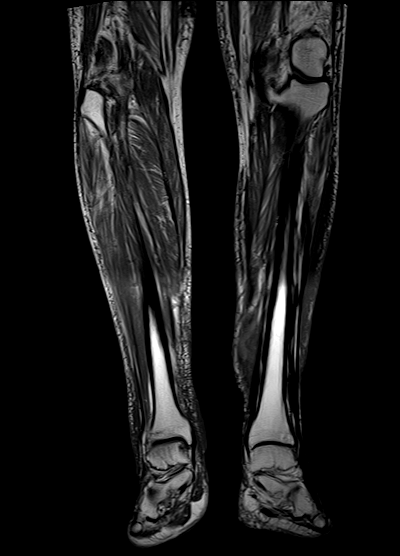
[im 25/25]
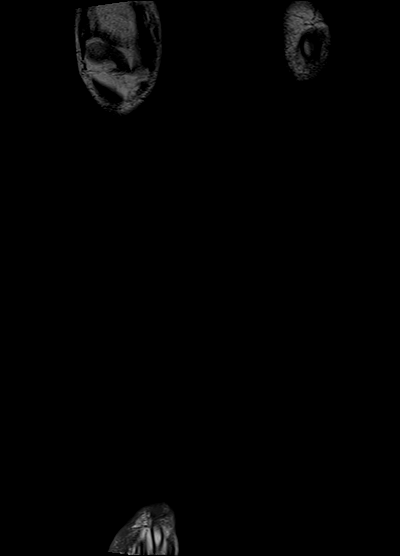

[Series 8: composed cor stir_comp · coronal · left · 6.0mm · 1.04mm/px · 4 of 25 slices shown]
[im 1/25]
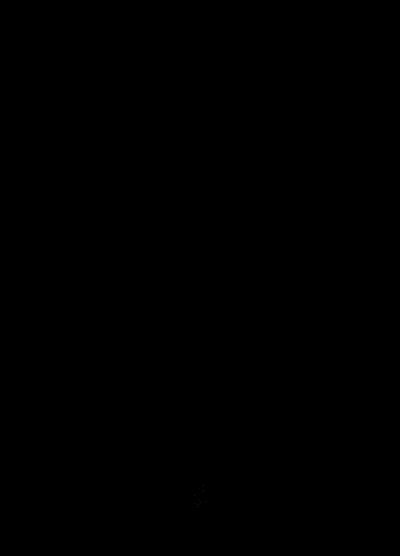
[im 9/25]
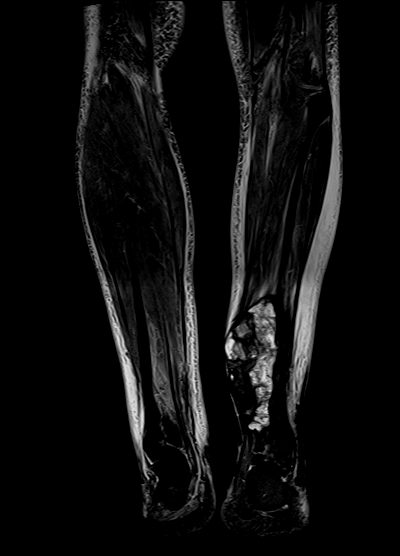
[im 17/25]
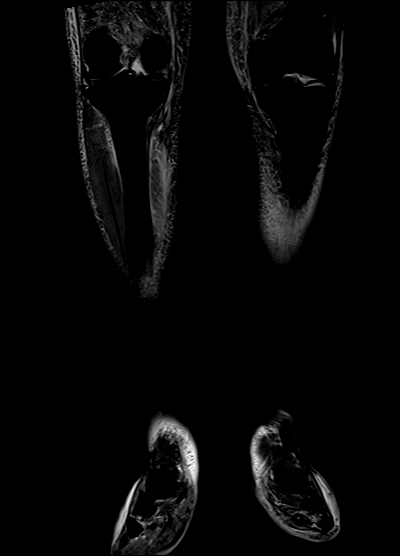
[im 25/25]
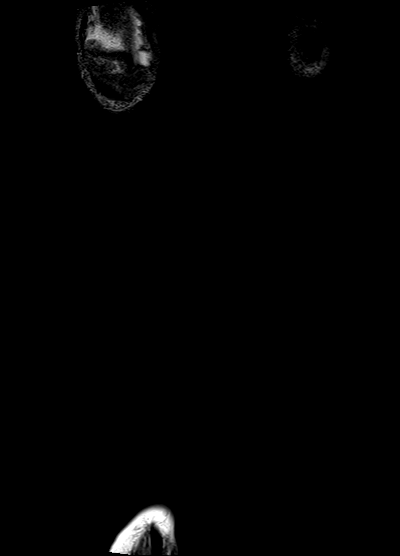

[Series 9: composed cor stir_comp_filt · coronal · left · 6.0mm · 1.04mm/px · 4 of 25 slices shown]
[im 1/25]
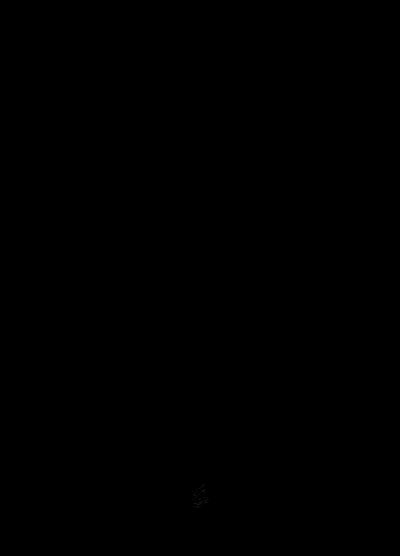
[im 9/25]
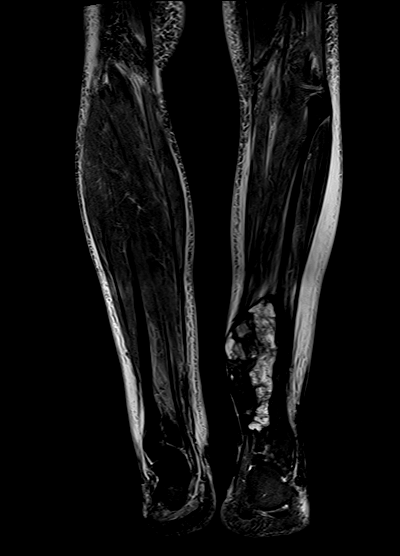
[im 17/25]
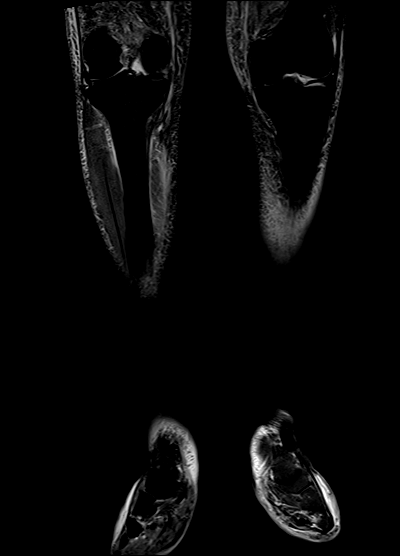
[im 25/25]
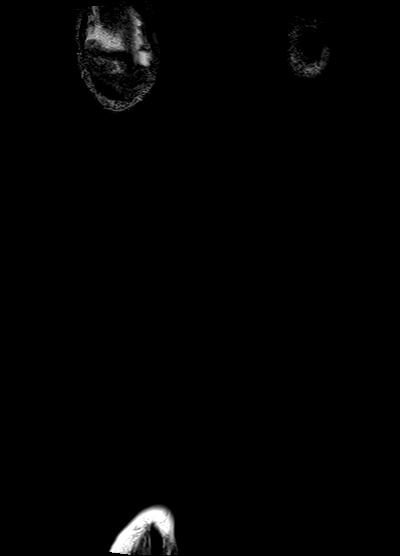

[Series 12: ax t1_comp · axial · left · 5.0mm · 0.70mm/px · z∈[-193,+245]mm · 11 of 74 slices shown]
[im 1/74]
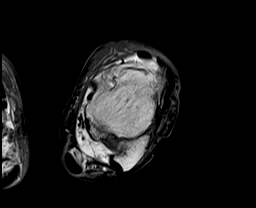
[im 8/74]
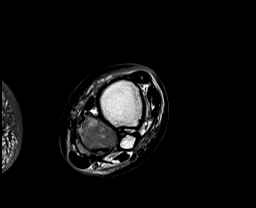
[im 15/74]
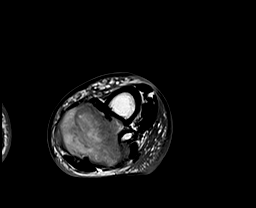
[im 22/74]
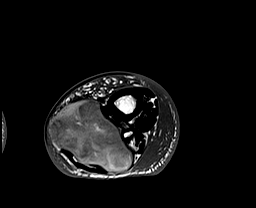
[im 30/74]
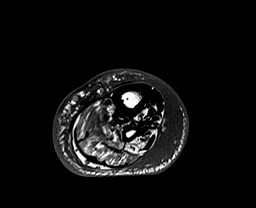
[im 37/74]
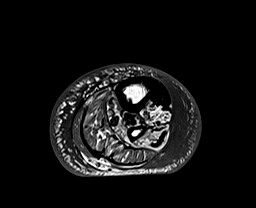
[im 44/74]
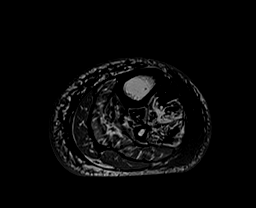
[im 52/74]
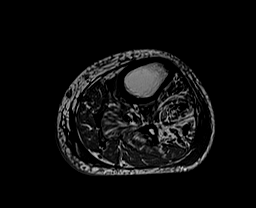
[im 59/74]
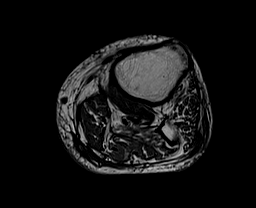
[im 66/74]
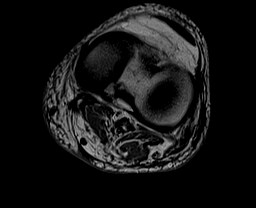
[im 74/74]
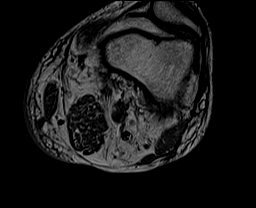

[Series 15: T2 · axial · left · 5.0mm · 0.87mm/px · z∈[-193,+245]mm · 11 of 74 slices shown (1 of 2)]
[im 1/74]
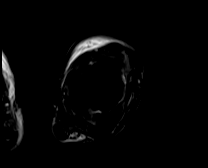
[im 8/74]
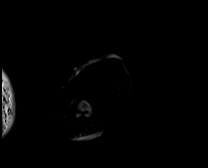
[im 15/74]
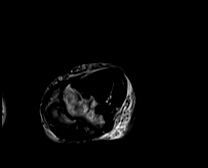
[im 22/74]
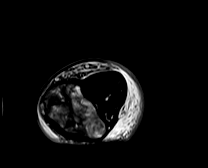
[im 30/74]
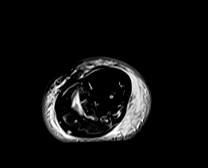
[im 37/74]
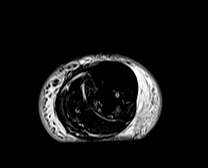
[im 44/74]
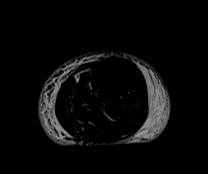
[im 52/74]
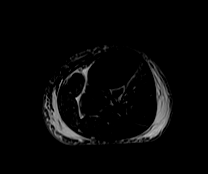
[im 59/74]
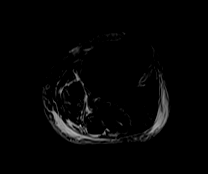
[im 66/74]
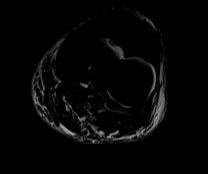
[im 74/74]
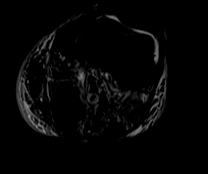

[Series 18: T2 · sagittal · left · 5.0mm · 1.15mm/px · 4 of 26 slices shown (2 of 2)]
[im 1/26]
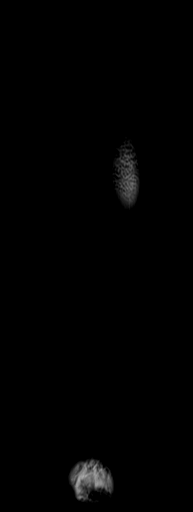
[im 9/26]
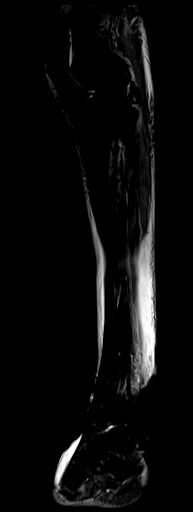
[im 17/26]
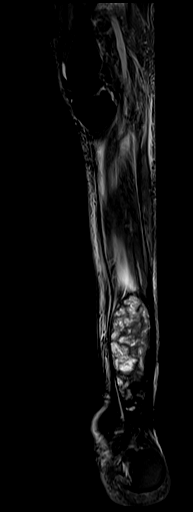
[im 26/26]
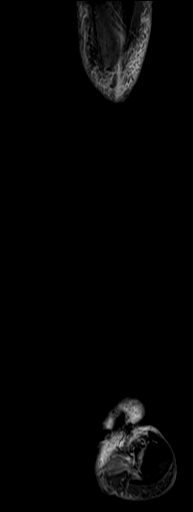

[40 of 40 positions shown; findings below may reference images not displayed]

FINDINGS: Bones/Joint/Cartilage

Both lower legs are included on the coronal images. As seen on the
prior studies, there is a large mass posteriorly in the distal left
lower leg which is further described below. This mass erodes the
posterior cortex of the distal tibia and fibula as correlated with
CT. No adjacent bone marrow edema or pathologic fracture. There is
no involvement the ankle joint which appears ankylosed at the distal
tibiofibular joint as correlated with CT. The left knee appears
unremarkable. No significant osseous findings in the visualized
right lower leg.

Ligaments

Not relevant for exam/indication.

Muscles and Tendons

Mild fatty atrophy throughout the lower extremity musculature, left
greater than right. There are extensive calcifications and
thickening associated with the extensor tendons the left lower leg.
No evidence of tendon rupture.

Soft tissues

As noted above, there is a large fairly well-circumscribed and
lobulated soft tissue mass posteriorly in the distal left lower leg.
This measures 14.3 x 4.6 x 6.8 cm and has heterogeneous T1 and T2
signal. Areas of intrinsic T1 shortening may reflect previous
hemorrhage. There are small associated calcifications on the
previous CT. This mass lies between the distal tibia and fibula and
the Achilles tendon. There is cortical erosion of the distal tibia
and fibula posteriorly. The mass is incompletely characterized
without contrast. There is diffuse subcutaneous edema in both lower
legs, left greater than right, without focal fluid collection.
IMPRESSION: 1. Large soft tissue mass posteriorly in the distal left lower leg
with associated cortical erosion of the distal tibia and fibula. The
mass is heterogeneous in signal and incompletely characterized
without contrast, although not typical of a benign hematoma.
Findings are suspicious for neoplasm, likely sarcoma. Tissue
sampling or excision biopsy recommended.
2. Underlying ankylosis of the distal tibiofibular joint with
calcifications within the lower leg extensor tendons, likely
posttraumatic. Asymmetric left lower leg muscular atrophy.
3. Subcutaneous edema in both lower legs, worse on the left.
